# Patient Record
Sex: Female | Born: 1970 | Race: White | Hispanic: No | Marital: Married | Smoking: Never smoker
Health system: Southern US, Academic
[De-identification: ages and names within clinical notes are randomized; demographics above are authoritative.]

## PROBLEM LIST (undated history)

## (undated) DIAGNOSIS — C642 Malignant neoplasm of left kidney, except renal pelvis: Secondary | ICD-10-CM

## (undated) DIAGNOSIS — Z800 Family history of malignant neoplasm of digestive organs: Secondary | ICD-10-CM

## (undated) DIAGNOSIS — I10 Essential (primary) hypertension: Secondary | ICD-10-CM

## (undated) DIAGNOSIS — E892 Postprocedural hypoparathyroidism: Secondary | ICD-10-CM

## (undated) HISTORY — PX: HX HYSTERECTOMY: SHX81

## (undated) HISTORY — PX: TOTAL THYROIDECTOMY: SHX2547

## (undated) HISTORY — DX: Family history of malignant neoplasm of digestive organs: Z80.0

## (undated) HISTORY — PX: HX CYST REMOVAL: SHX22

## (undated) HISTORY — PX: HX GALL BLADDER SURGERY/CHOLE: SHX55

## (undated) HISTORY — PX: NEPHROURETERECTOMY: SUR876

---
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
## 2017-12-18 IMAGING — MG 3D SCREENING MAMMO BIL W/CAD
5 series · 8 of 24 positions shown · non-contrast
Comparison: 10/03/2018 and 09/30/2017.

------------- REPORT [id number] -------------
[facility]
[address]
Daina Ms.VENUSTE, KANTCLARET:
We wish to report the following on your recent mammography examination. We are sending a report to your referring physician or other health care provider. 
(       Normal/Negative:
No evidence of cancer.
This statement is mandated by the [place], Department of Health.
Your examination was performed by one of our technologists, who are registered radiological technologists and also specially certified in mammography:
___
Parlak, Edaly (M)
Dang, Mcalex (M)

Your mammogram was interpreted by our radiologist.
( 
Sofeine Made, M.D.
(Annual Breast Examination by a physician or other health care provider
(Annual Mammography Screening beginning at age 40
(Monthly Breast Self Examination
------------- REPORT [id number] -------------
NESIGILINK, AIVARUNCE
EXAM:  3D BILATERAL ANNUAL SCREENING DIGITAL MAMMOGRAM WITH TOMOSYNTHESIS AND CAD
INDICATION: Screening.

[Series 9536: R CC · right · 0.10mm/px · 2 of 2 slices shown]
[im 1/2]
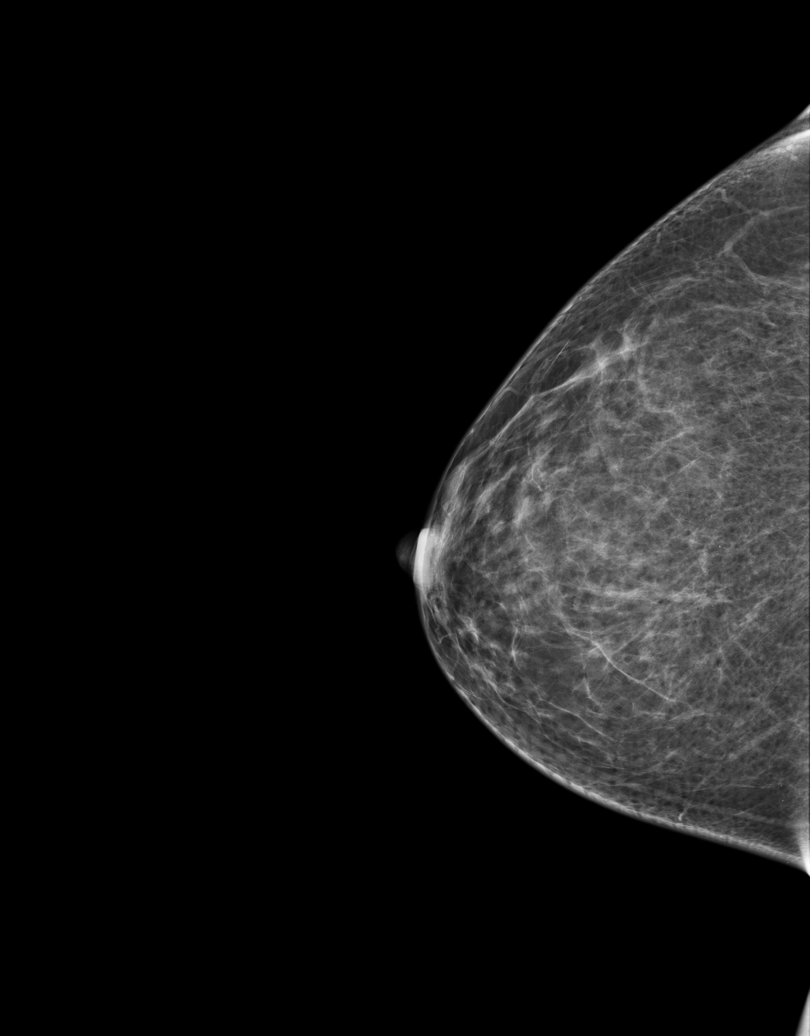
[im 2/2]
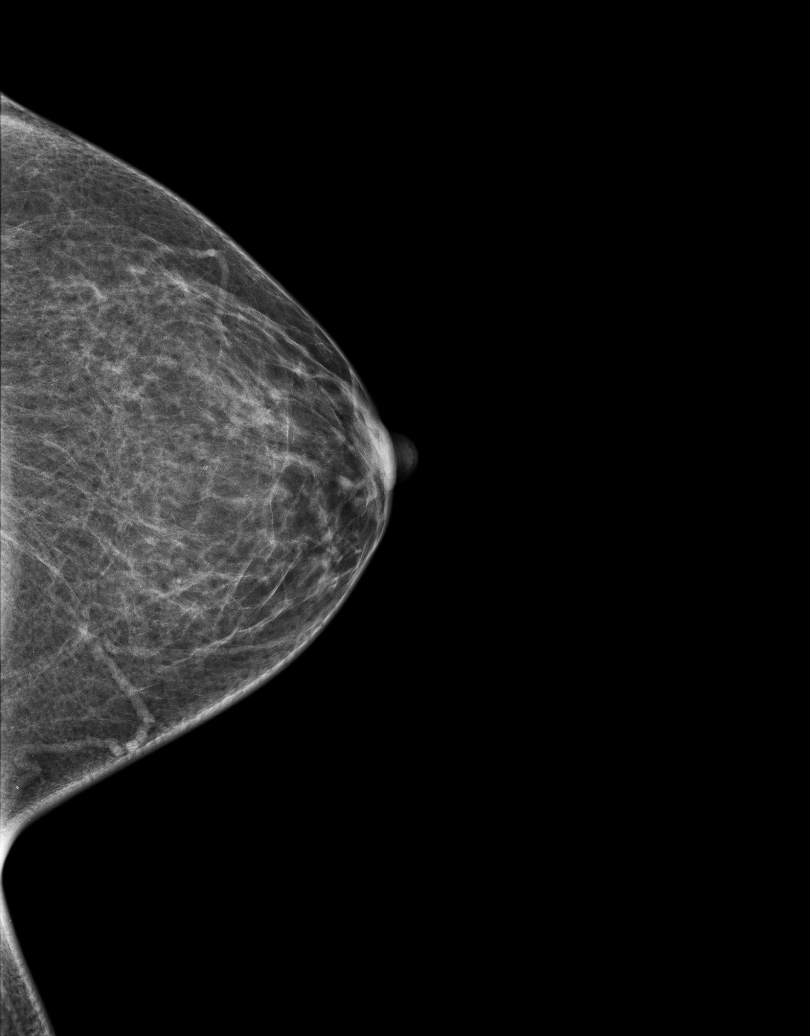

[Series 9538: 3D SCREENING MAMMO BIL W/CAD · 2 acquisitions, 3 frames shown (1 of 2)]
[im 1/2]
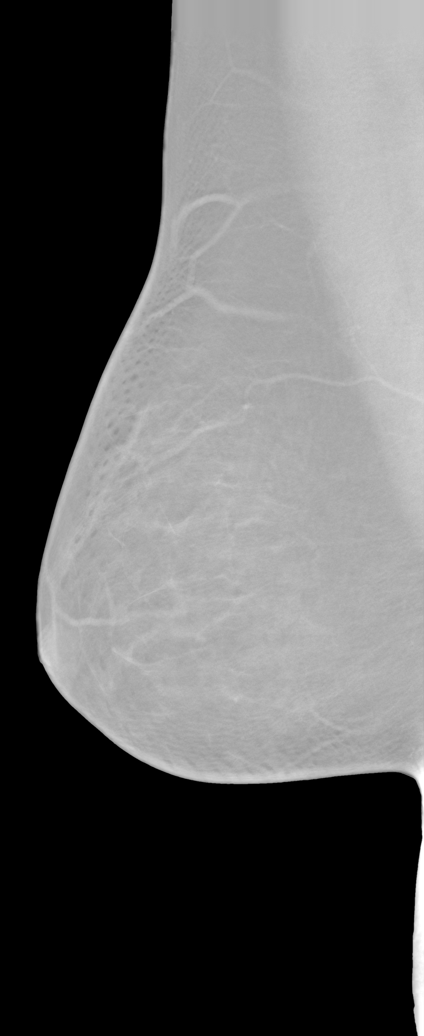
[im 2/2]
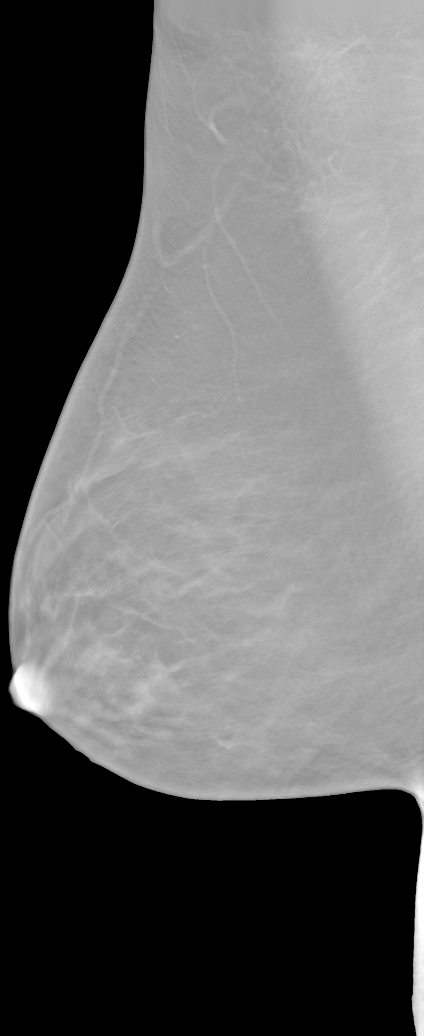
[im 2/2]
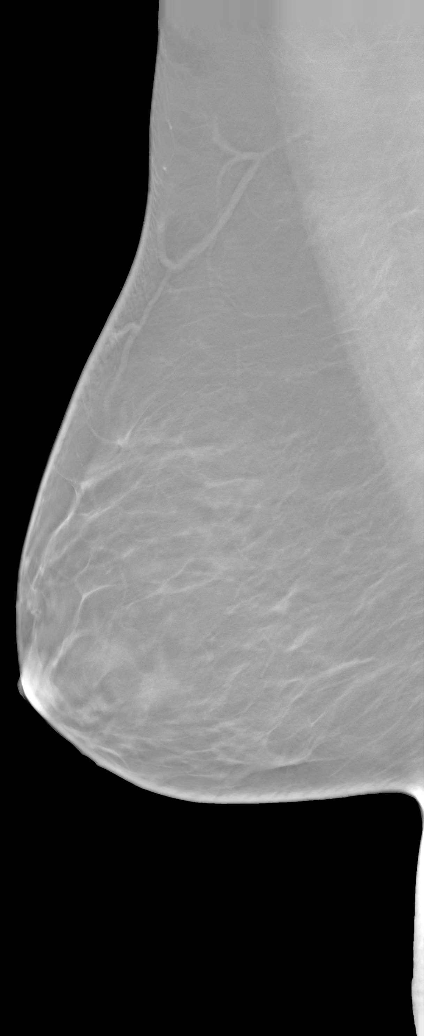

[R]
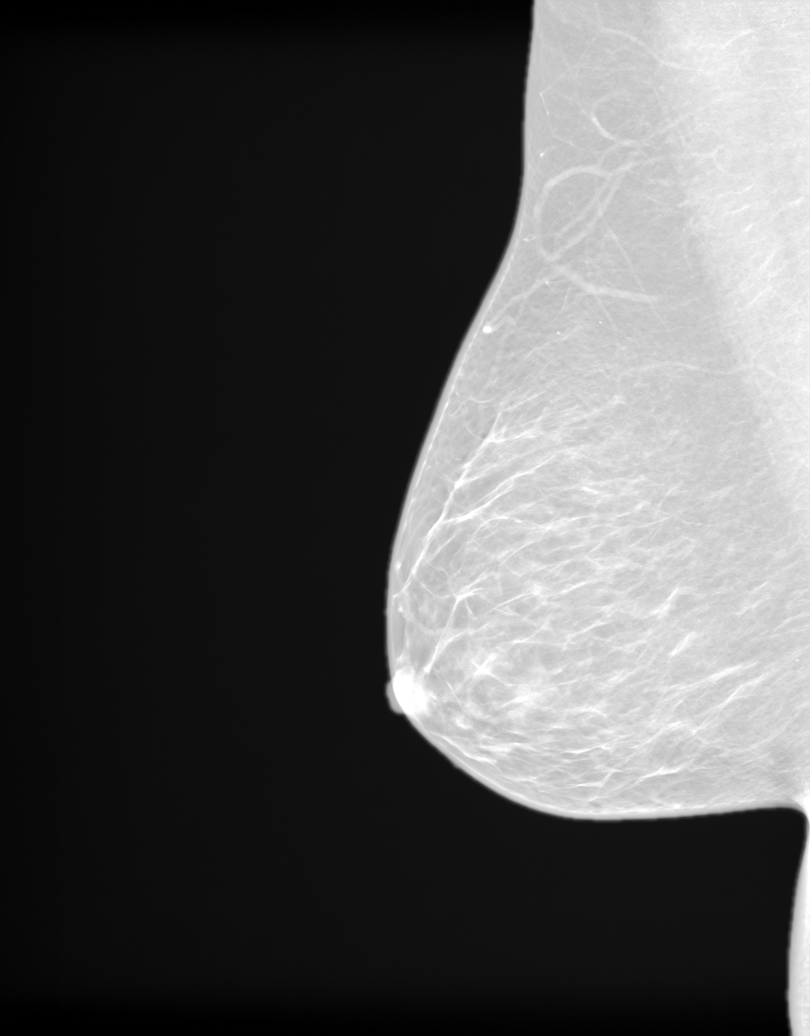

[3D SCREENING MAMMO BIL W/CAD (2 of 2) · tomo slice 10/64.0]
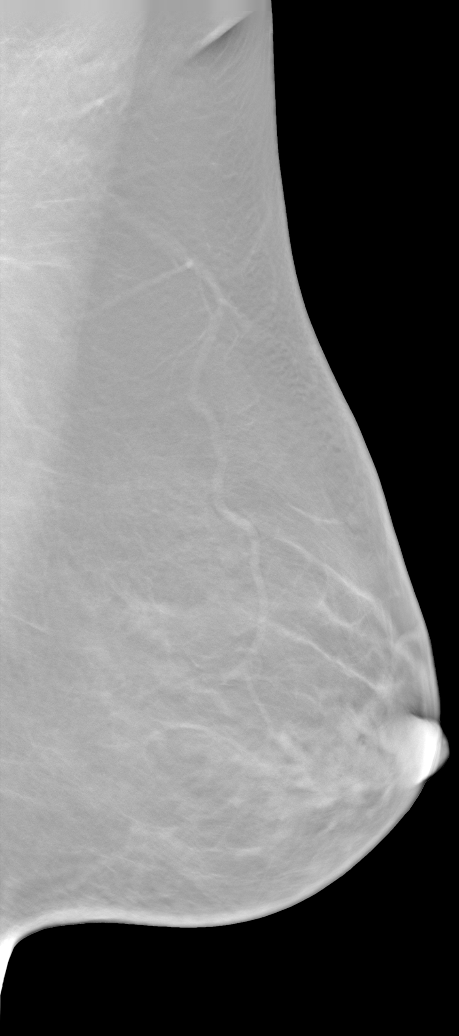

[L]
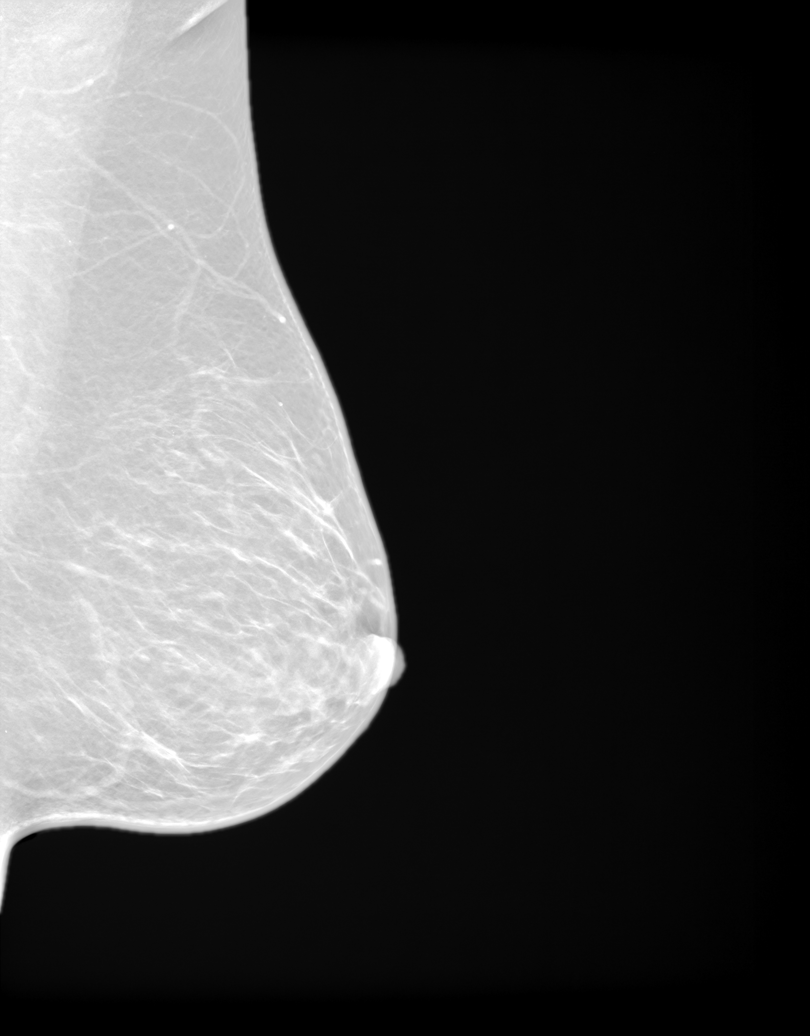

[8 of 24 positions shown; findings below may reference images not displayed]

FINDINGS: There are scattered fibroglandular elements.  There is no mass or suspicious cluster of microcalcifications.   There is no architectural distortion, skin thickening or nipple retraction.
IMPRESSION: 1.  BIRADS 2-Benign findings. Patient has been added in a reminder system with a target date for the next screening mammography.

2.  DENSITY CODE –  B (Scattered areas of fibroglandular density).

Final Assessment Code:

Bi-Rads 2 

BI-RADS 0
Need additional imaging evaluation

BI-RADS 1
Negative mammogram

BI-RADS 2
Benign finding

BI-RADS 3
Probably benign finding: short-interval follow-up suggested

BI-RADS 4
Suspicious abnormality:  biopsy should be considered

BI-RADS 5
Highly suggestive of malignancy; appropriate action should be taken

BI-RADS 6
Known Biopsy-proven Malignancy – Appropriate action should be taken

NOTE:
In compliance with Federal regulations, the results of this mammogram are being sent to the patient.

## 2018-12-22 IMAGING — MG 3D SCREENING MAMMO BIL W/CAD
5 series · 8 of 24 positions shown · non-contrast
Comparison: 12/12/2019 and 12/10/2018.

------------- REPORT [id number] -------------
[facility]
[address]
Daina Ms.VENUSTE, KANTCLARET:
We wish to report the following on your recent mammography examination. We are sending a report to your referring physician or other health care provider. 
(       Normal/Negative:
No evidence of cancer.
This statement is mandated by the [place], Department of Health.
Your examination was performed by one of our technologists, who are registered radiological technologists and also specially certified in mammography:
___
Parlak, Edaly (M)
Nepomuceno, Martinez (M)

Your mammogram was interpreted by our radiologist.
( 
Sofeine Made, M.D.
(Annual Breast Examination by a physician or other health care provider
(Annual Mammography Screening beginning at age 40
(Monthly Breast Self Examination
------------- REPORT [id number] -------------
OVEDJE, EKEBO
EXAM:  3D BILATERAL ANNUAL SCREENING DIGITAL MAMMOGRAM WITH TOMOSYNTHESIS AND CAD
INDICATION: Screening.

[R CC · right · 0.10mm/px · 2 of 2 slices shown]
[im 1/2]
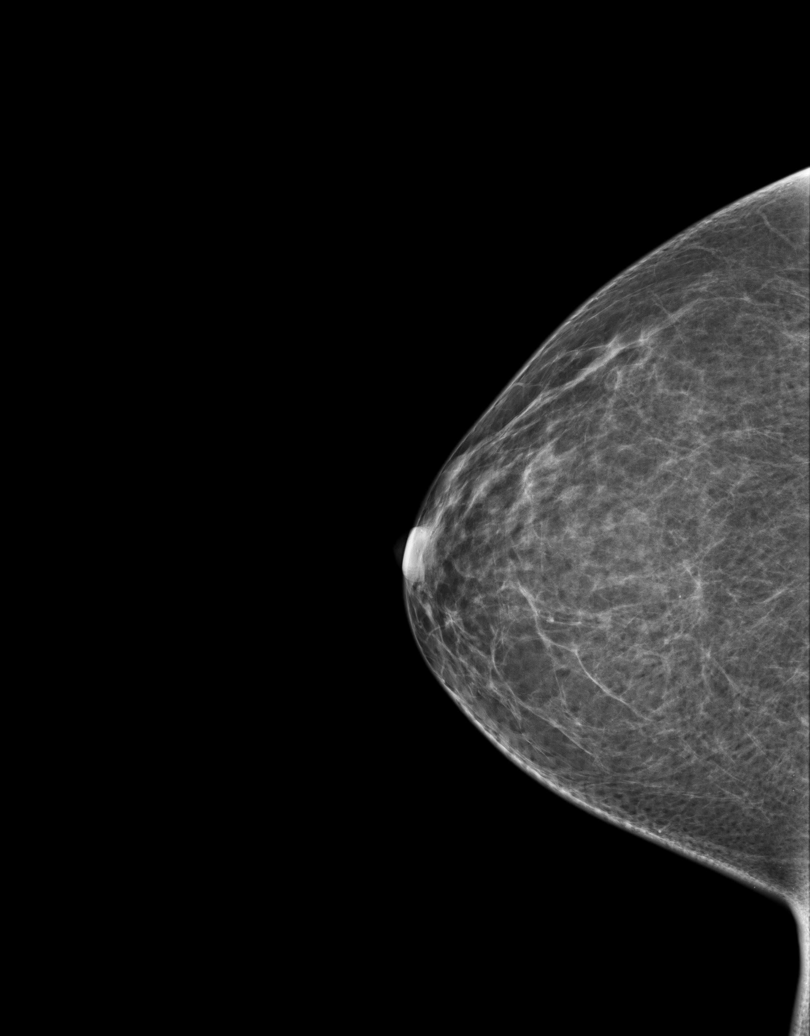
[im 2/2]
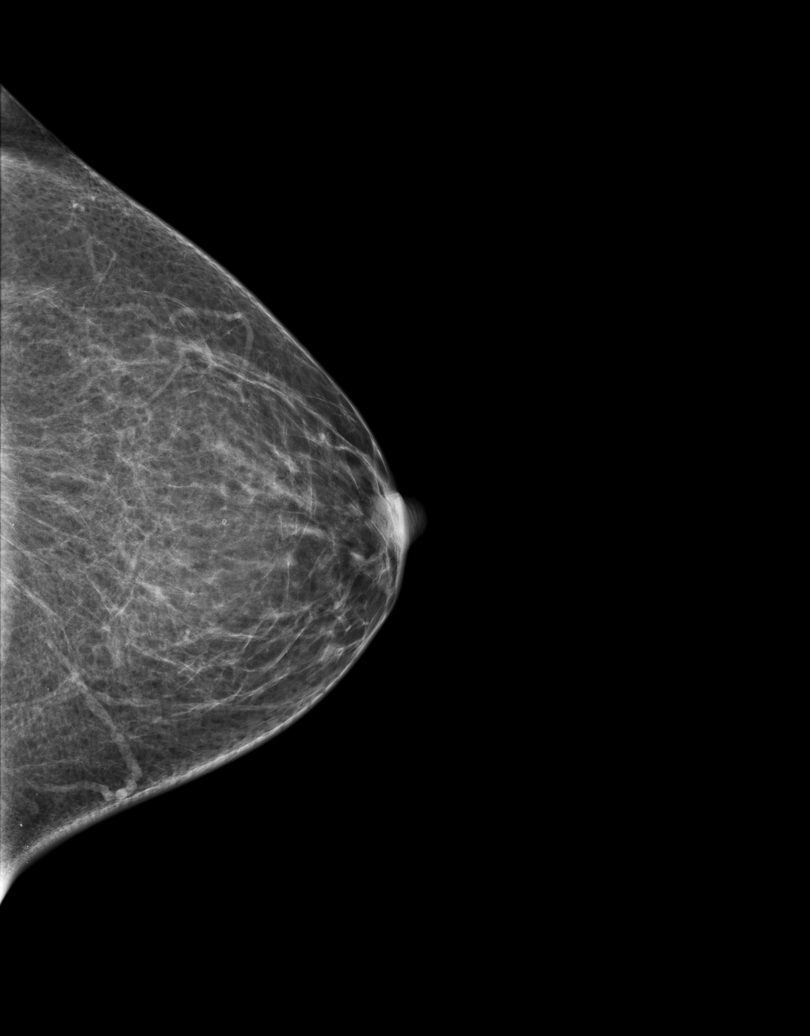

[3D SCREENING MAMMO BIL W/CAD · 2 acquisitions, 3 frames shown (1 of 2)]
[im 1/2]
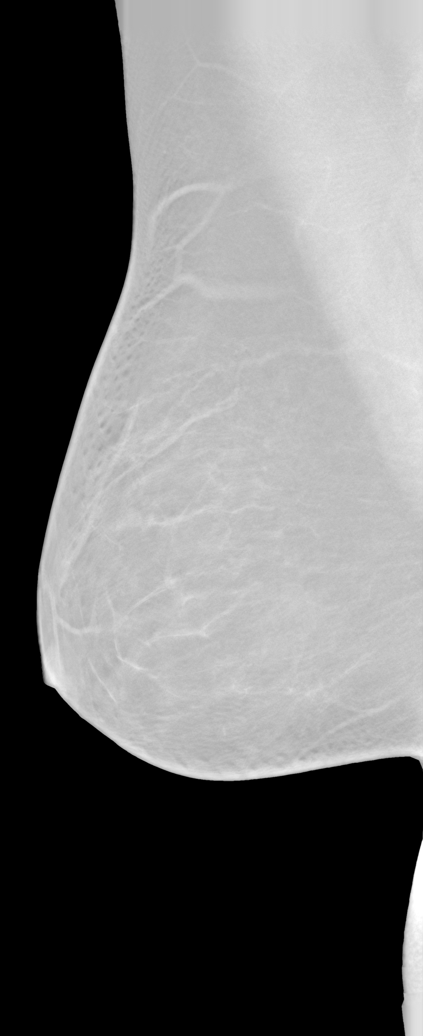
[im 2/2]
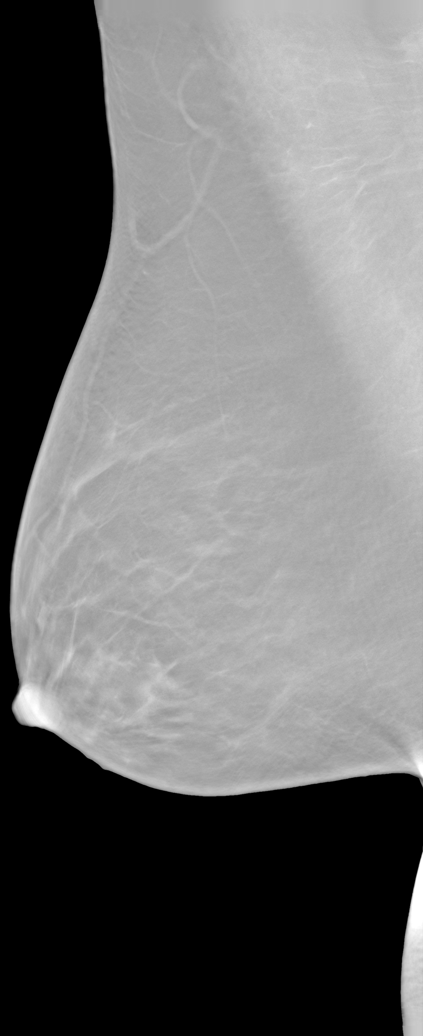
[im 2/2]
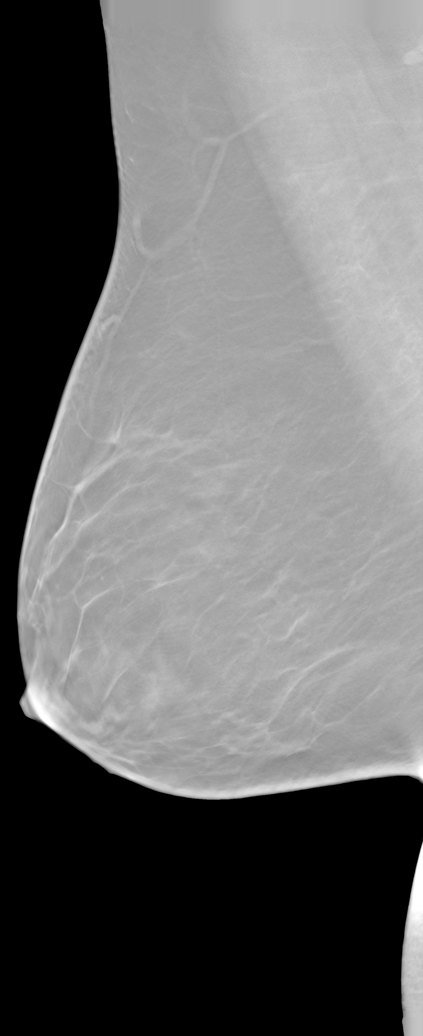

[R]
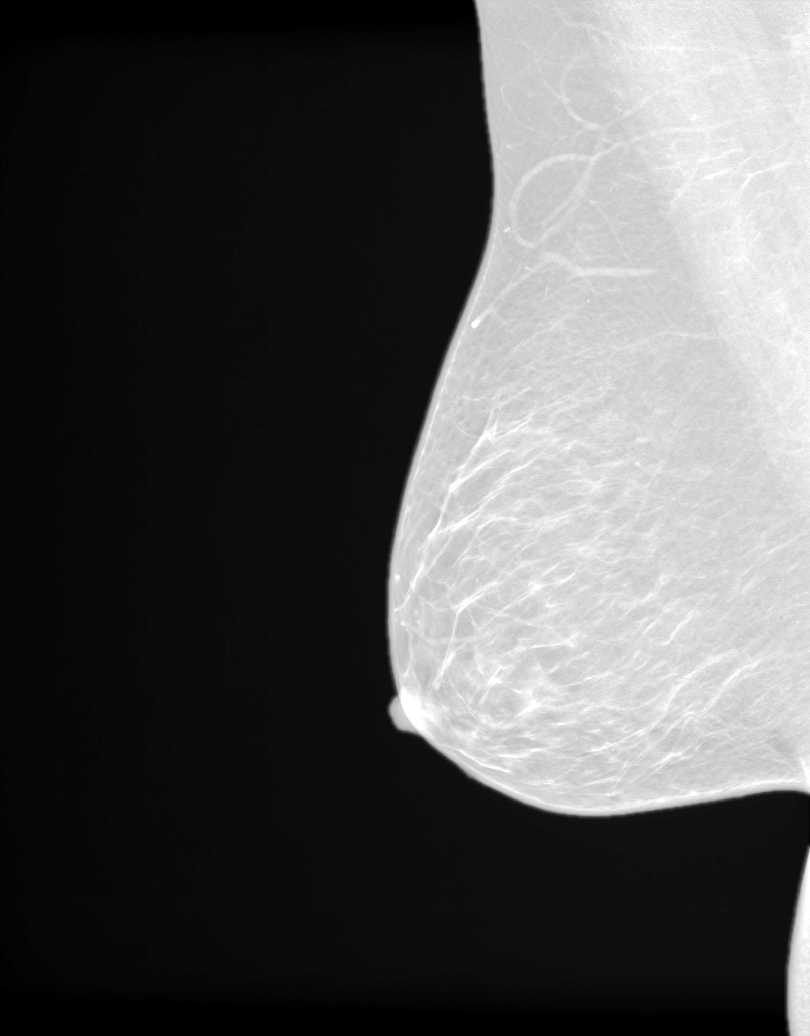

[3D SCREENING MAMMO BIL W/CAD (2 of 2) · tomo slice 11/68.0]
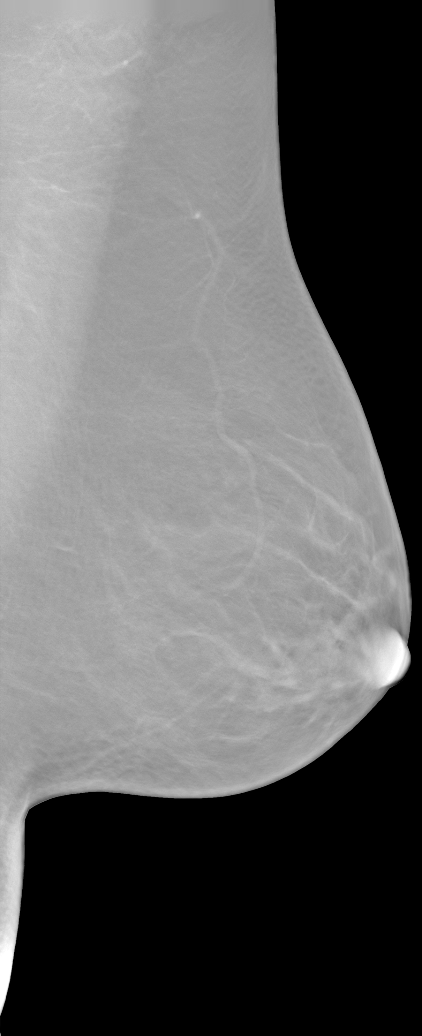

[L]
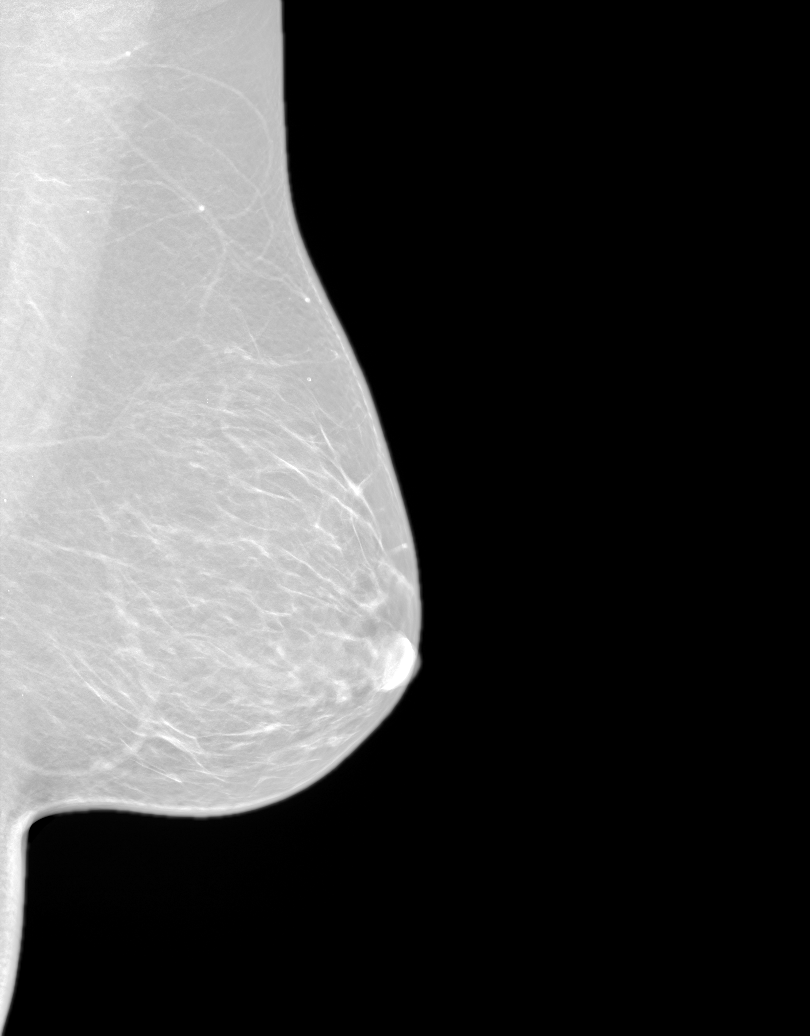

[8 of 24 positions shown; findings below may reference images not displayed]

FINDINGS: There are scattered fibroglandular elements.  There is no mass or suspicious cluster of microcalcifications.   There is no architectural distortion, skin thickening or nipple retraction.
IMPRESSION: 1.  BIRADS 2-Benign findings. Patient has been added in a reminder system with a target date for the next screening mammography.

2.  DENSITY CODE –  B (Scattered areas of fibroglandular density).

Final Assessment Code:

Bi-Rads 2 

BI-RADS 0
Need additional imaging evaluation

BI-RADS 1
Negative mammogram

BI-RADS 2
Benign finding

BI-RADS 3
Probably benign finding; short-interval follow-up suggested

BI-RADS 4
Suspicious abnormality; biopsy should be considered

BI-RADS 5
Highly suggestive of malignancy; appropriate action should be taken

BI-RADS 6
Known biopsy-proven malignancy; appropriate action should be taken

NOTE:
In compliance with Federal regulations, the results of this mammogram are being sent to the patient.

## 2019-12-28 IMAGING — MG 3D SCREENING MAMMO BIL W/CAD
5 series · 8 of 24 positions shown · non-contrast
Comparison: 01/08/2020 and 01/04/2019.

------------- REPORT [id number] -------------
[facility]
[address]
Daina Ms.VENUSTE, KANTCLARET:
We wish to report the following on your recent mammography examination. We are sending a report to your referring physician or other health care provider. 
(       Normal/Negative:
No evidence of cancer.
This statement is mandated by the [place], Department of Health.
Your examination was performed by one of our technologists, who are registered radiological technologists and also specially certified in mammography:
___
Parlak, Edaly (M)
Nepomuceno, Martinez (M)

Your mammogram was interpreted by our radiologist.
( 
Sofeine Made, M.D.
(Annual Breast Examination by a physician or other health care provider
(Annual Mammography Screening beginning at age 40
(Monthly Breast Self Examination
------------- REPORT [id number] -------------
﻿
                         #:
EXAM:  3D BILATERAL ANNUAL SCREENING DIGITAL MAMMOGRAM WITH CAD AND TOMOSYNTHESIS
INDICATION: Screening.

[R]
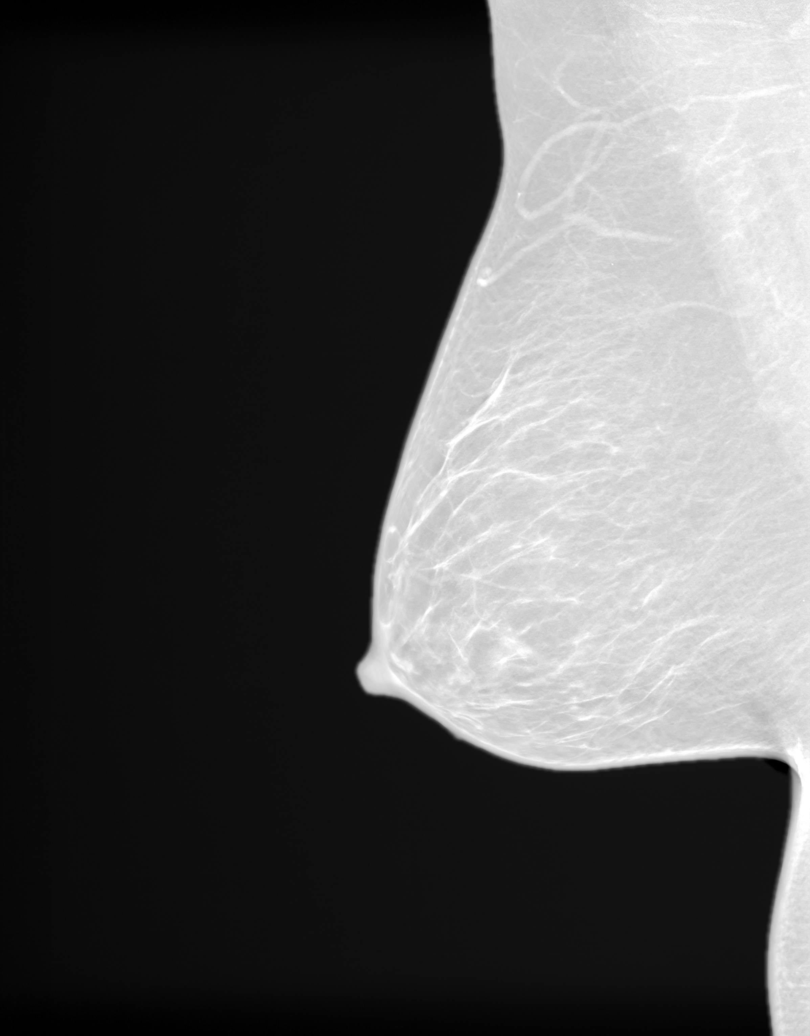

[L]
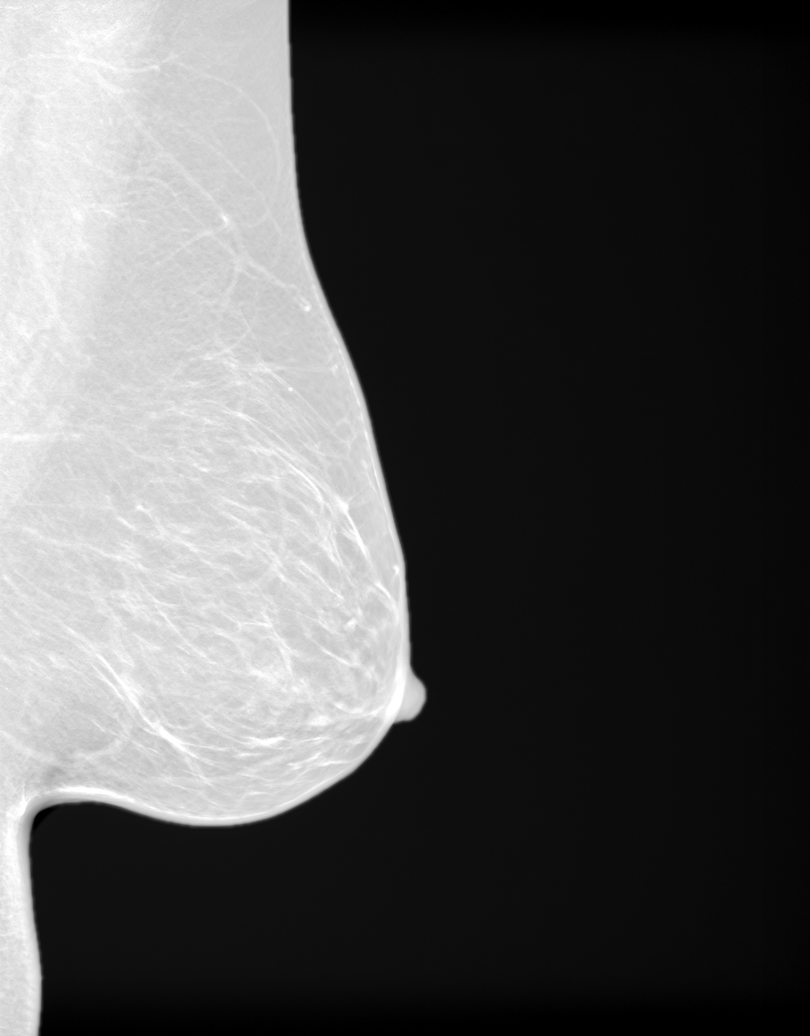

[R CC · right · 2 of 2 slices shown]
[im 1/2]
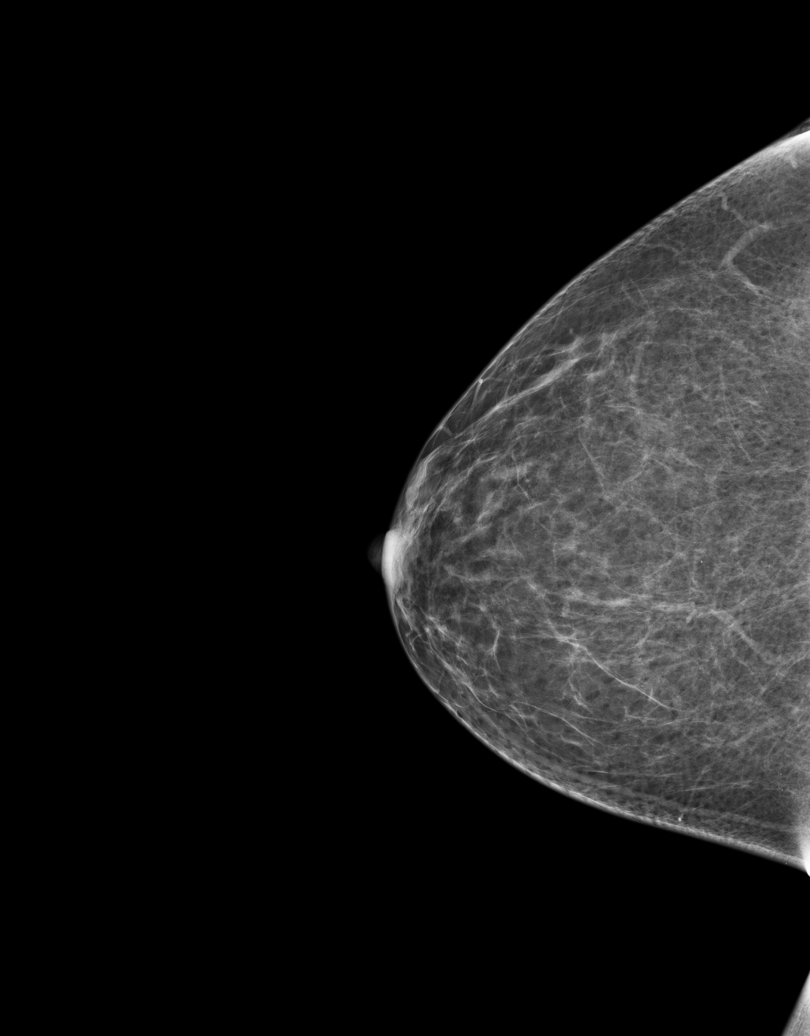
[im 2/2]
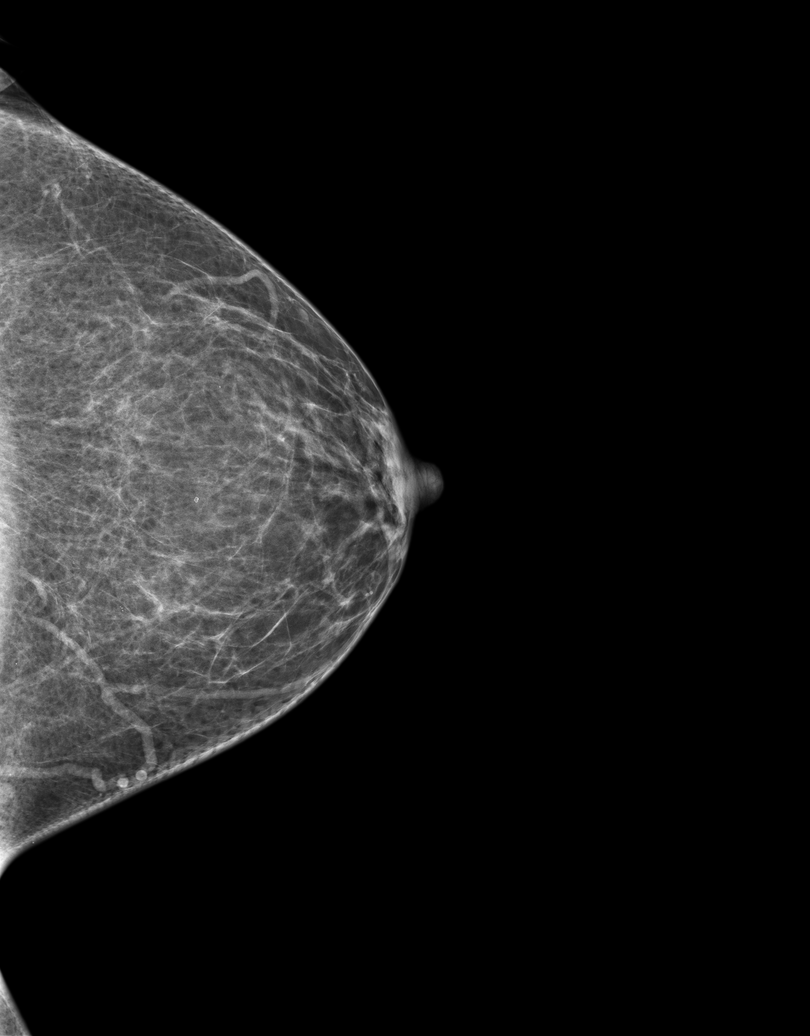

[3D SCREENING MAMMO BIL W/CAD · 2 acquisitions, 3 frames shown (1 of 2)]
[im 1/2]
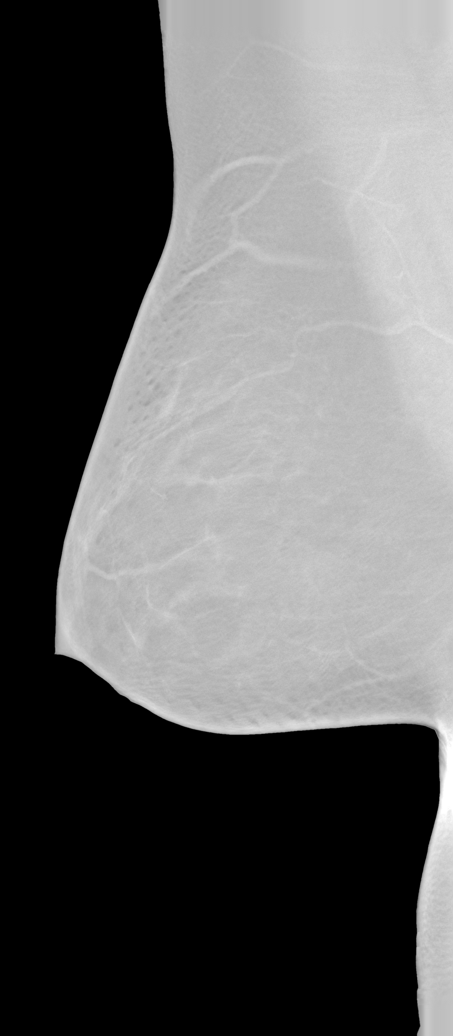
[im 2/2]
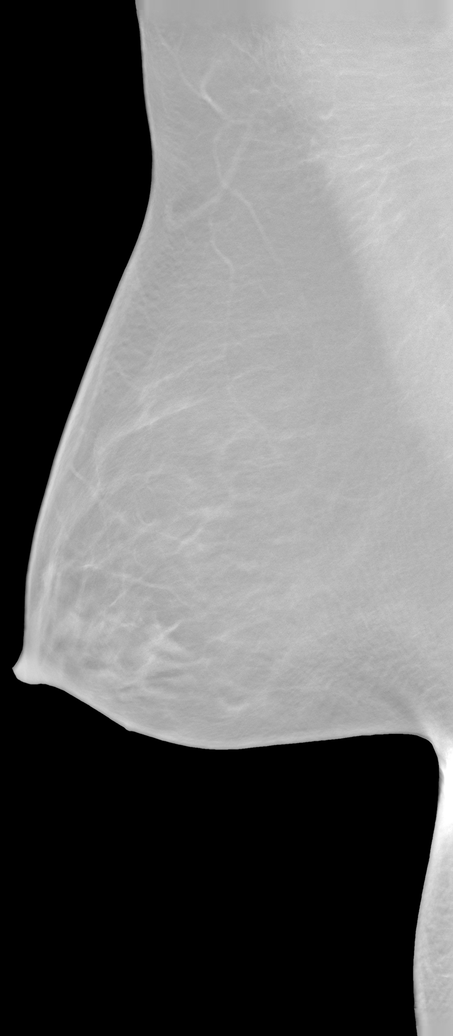
[im 2/2]
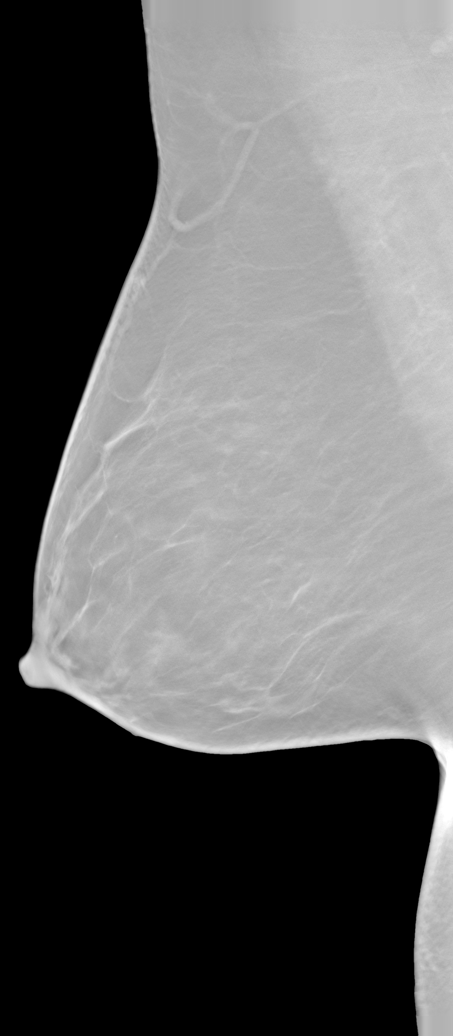

[3D SCREENING MAMMO BIL W/CAD (2 of 2) · tomo slice 12/72.0]
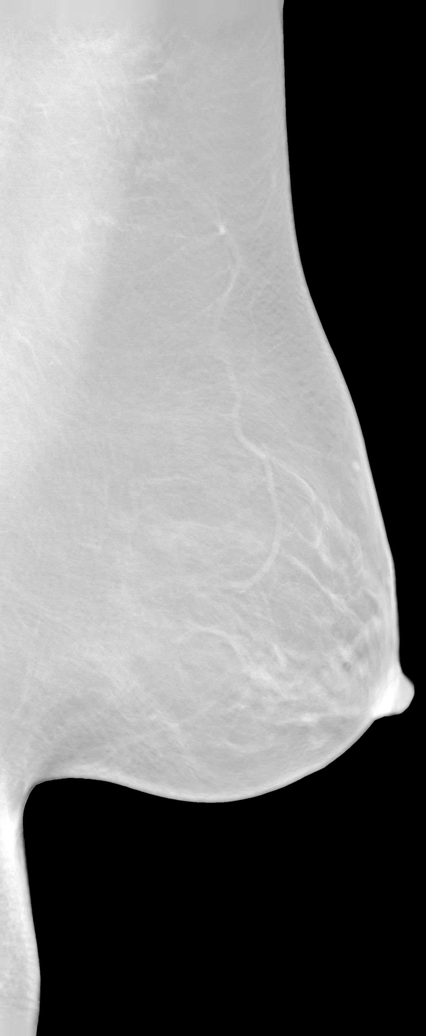

[8 of 24 positions shown; findings below may reference images not displayed]

FINDINGS: There are scattered fibroglandular elements.  There is no mass or suspicious cluster of microcalcifications.   There is no architectural distortion, skin thickening or nipple retraction.
IMPRESSION: 1.  BIRADS 2-Benign findings. Patient has been added in a reminder system with a target date for the next screening mammography.

2.  DENSITY CODE – B (Scattered areas of fibroglandular density). 

Final Assessment Code:

Bi-Rads 2

BI-RADS 0
Need additional imaging evaluation.

BI-RADS 1
Negative mammogram.

BI-RADS 2
Benign finding.

BI-RADS 3
Probably benign finding; short-interval follow-up suggested.

BI-RADS 4
Suspicious abnormality; biopsy should be considered.

BI-RADS 5
Highly suggestive of malignancy; appropriate action should be taken.

BI-RADS 6
Known biopsy-proven malignancy; appropriate action should be taken.

NOTE:
In compliance with Federal regulations, the results of this mammogram are being sent to the patient.

## 2020-11-05 IMAGING — MR MRI CERVICAL SPINE WITHOUT AND WITH CONTRAST
6 of 9 series · 30 of 48 positions shown · IV contrast (gadavist)
Comparison: None available.

﻿EXAM:  32118   MRI CERVICAL SPINE WITHOUT AND WITH CONTRAST
INDICATION: Left-sided neck pain and headaches. History of thyroidectomy.
TECHNIQUE: Multiplanar multisequential MRI of the cervical spine was performed without and with 4 mL of Gadavist.

[Series 5: T2 · sagittal · 3.0mm · 0.75mm/px · 4 of 13 slices shown (1 of 3)]
[im 1/13]
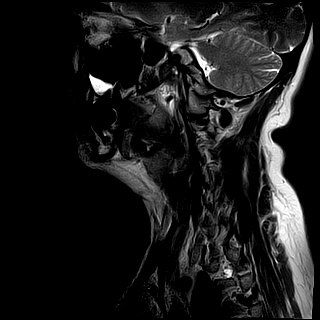
[im 5/13]
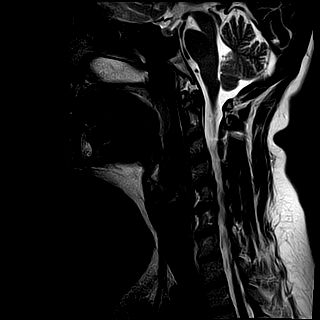
[im 9/13]
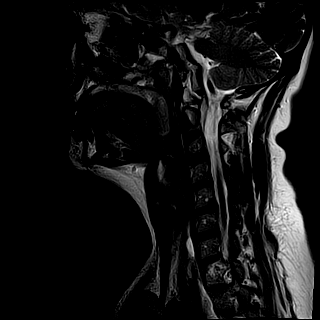
[im 13/13]
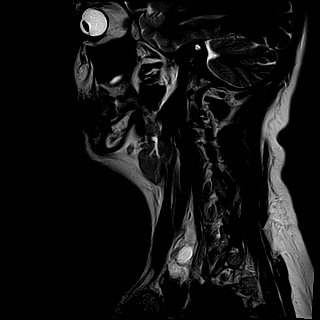

[Series 7: T1 · sagittal · 4.0mm · 0.47mm/px · 4 of 15 slices shown]
[im 1/15]
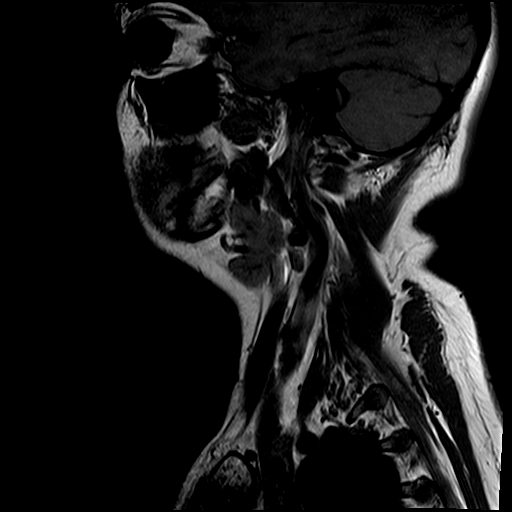
[im 5/15]
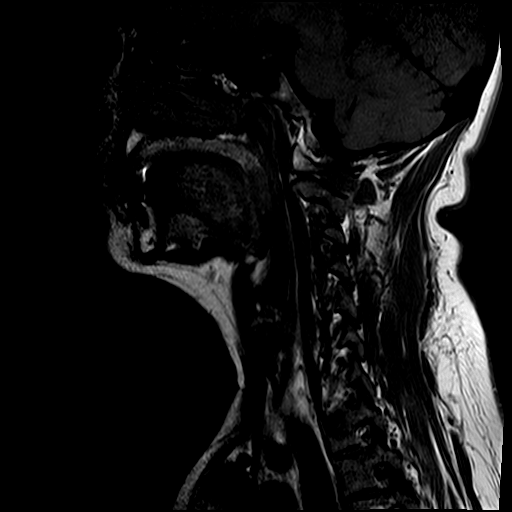
[im 10/15]
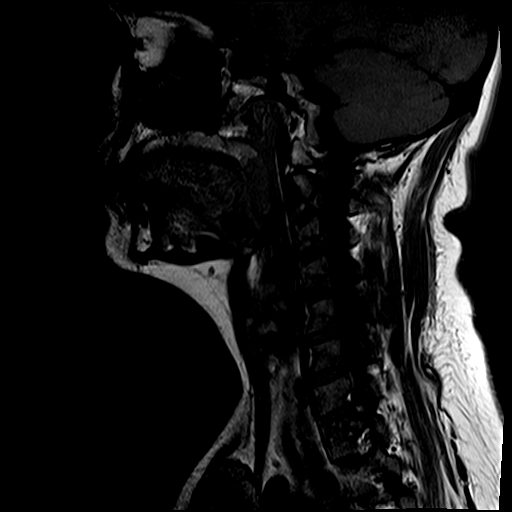
[im 15/15]
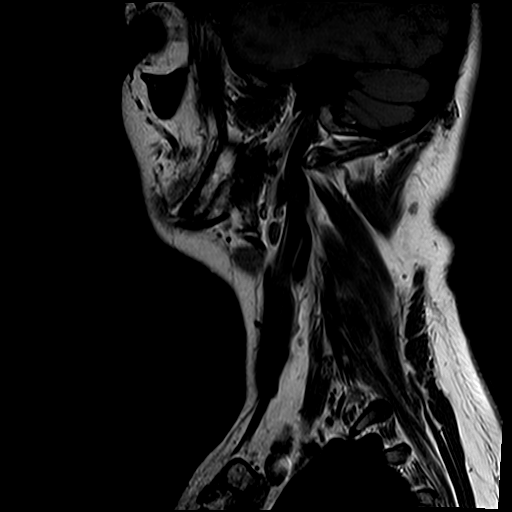

[Series 8: T1 fat-sat · sagittal · 4.0mm · 0.75mm/px · 4 of 15 slices shown (1 of 2)]
[im 1/15]
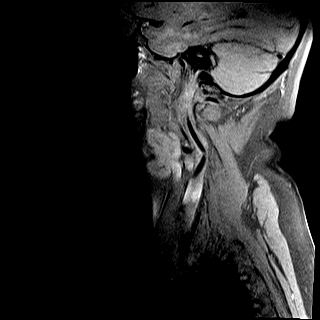
[im 5/15]
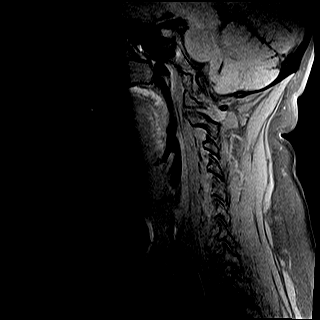
[im 10/15]
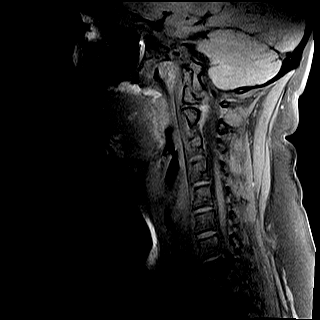
[im 15/15]
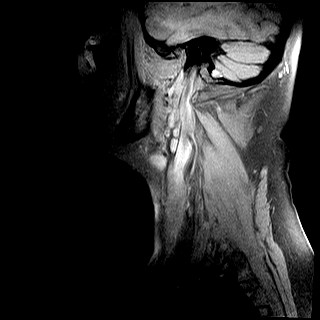

[Series 9: T2 · axial · 5.0mm · 0.35mm/px · z∈[-115,+34]mm · 8 of 26 slices shown (2 of 3)]
[im 1/26]
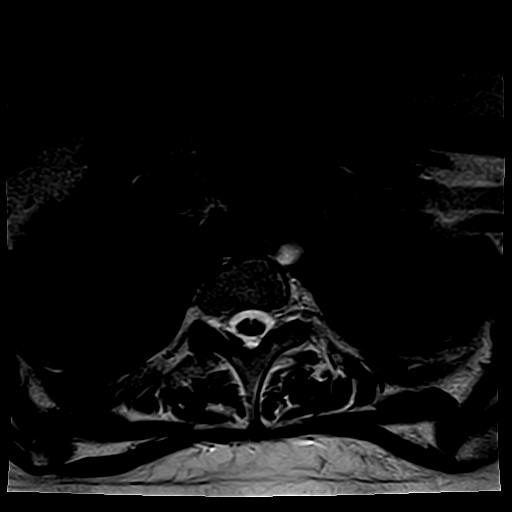
[im 4/26]
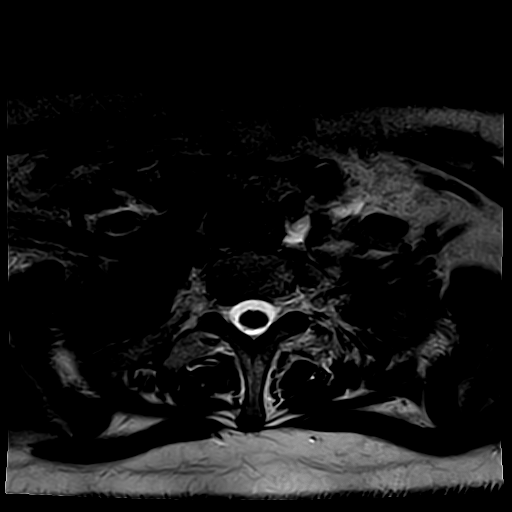
[im 8/26]
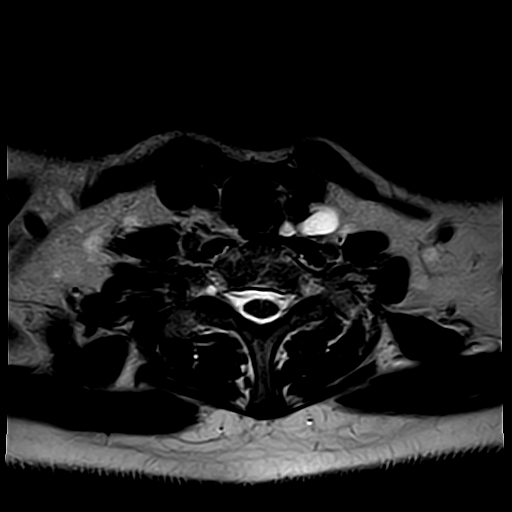
[im 11/26]
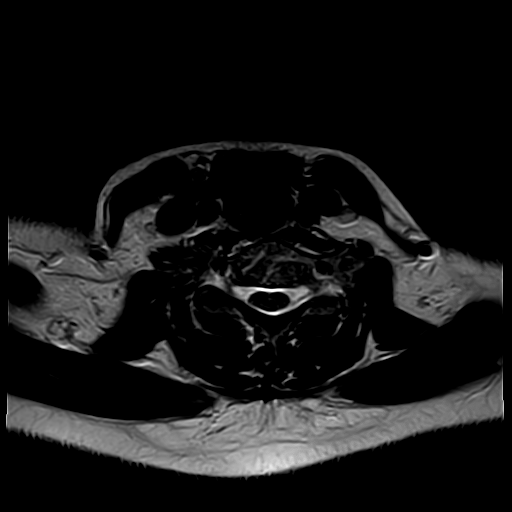
[im 15/26]
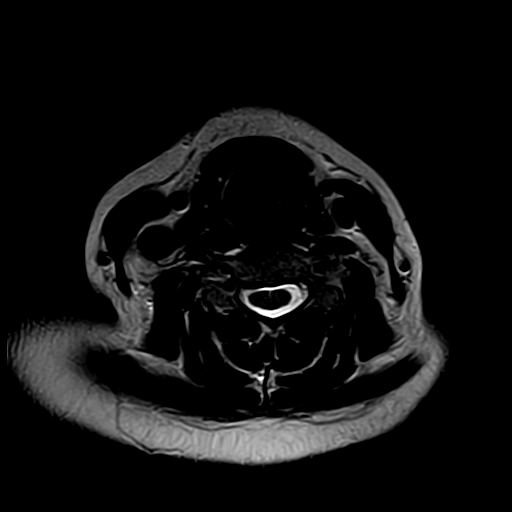
[im 18/26]
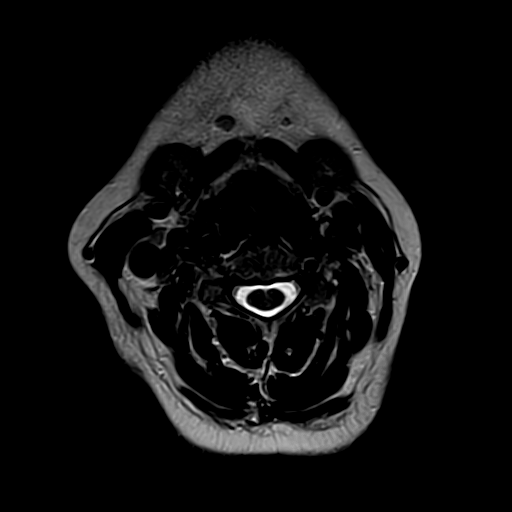
[im 22/26]
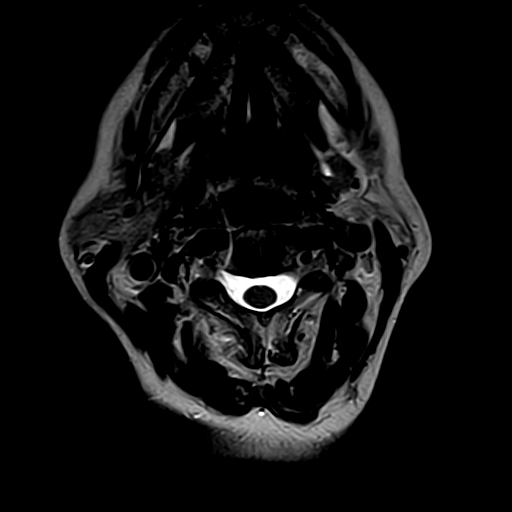
[im 26/26]
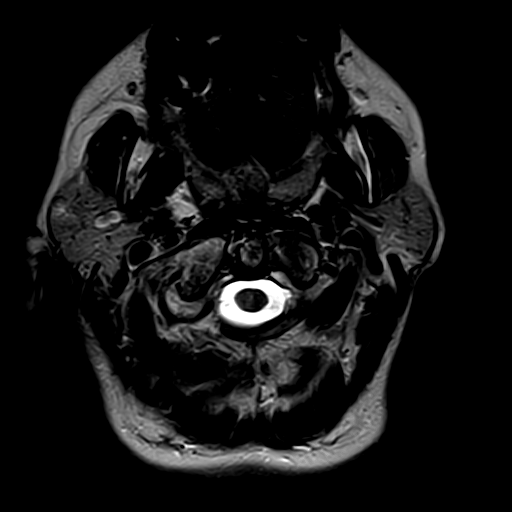

[Series 10: T1 fat-sat · axial · 5.0mm · 0.70mm/px · z∈[-115,-13]mm · 6 of 26 slices shown (2 of 2)]
[im 1/26]
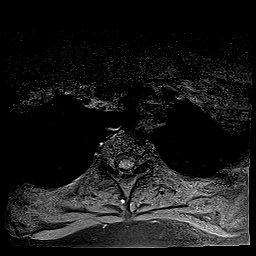
[im 4/26]
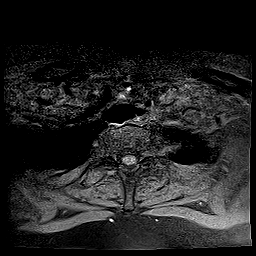
[im 8/26]
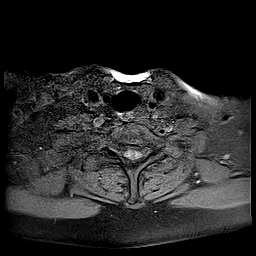
[im 11/26]
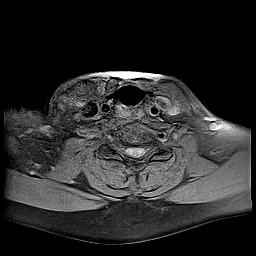
[im 15/26]
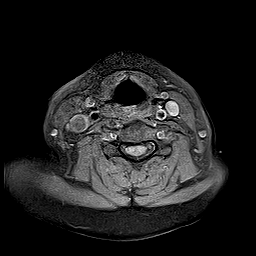
[im 18/26]
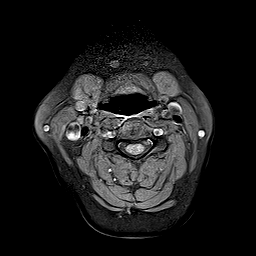

[Series 13: T2 · sagittal · 4.0mm · 0.75mm/px · 4 of 15 slices shown (3 of 3)]
[im 1/15]
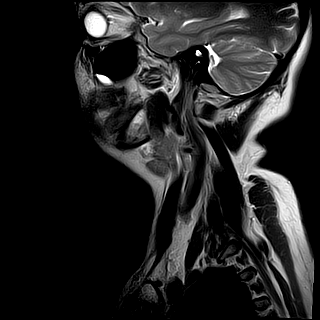
[im 5/15]
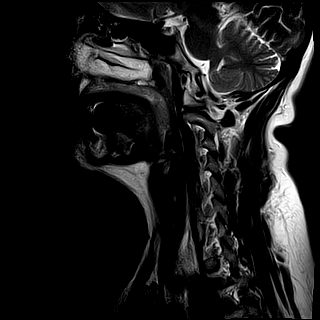
[im 10/15]
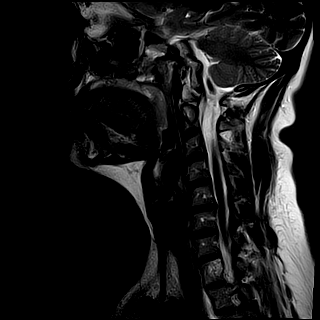
[im 15/15]
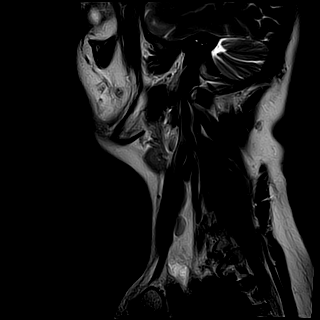

[30 of 48 positions shown; findings below may reference images not displayed]

FINDINGS: Mild reversal of cervical lordosis is likely positional. Bone marrow signal intensity is normal. There is no acute fracture or subluxation. Visualized spinal cord is also normal in signal intensity without evidence of compression at any level.

C2-3 level is unremarkable.

At C3-4 level, there is a minimal bulging annulus, minimally abutting the ventral spinal cord. There is mild left neural foraminal stenosis from uncovertebral joint hypertrophy.

At C4-5 level, there is a small broad-based central and right paracentral disc osteophyte complex with near complete effacement of the ventral CSF. There is mild right neural foraminal stenosis from uncovertebral joint hypertrophy.

At C5-6 level, there is a small broad-based central disc osteophyte complex mildly indenting the ventral cord. There is no significant neural foraminal stenosis.

C6-7 and C7-T1 levels are unremarkable. There is a small hemangioma within the left aspect of T1 vertebral body. Small cysts are incidentally noted within the left thyroidectomy bed.

Following intravenous contrast administration, there is no abnormal spinal cord or paraspinal soft tissue enhancement.
IMPRESSION: 1. Mild indentation of the ventral cord at C5-6 level from a small central disc osteophyte complex. 

2. Multilevel neural foraminal stenosis as detailed above.

## 2021-01-01 IMAGING — MG 3D SCREENING MAMMO BIL W/CAD & TOMO
5 series · 8 of 24 positions shown · non-contrast
Comparison: 10/27/2021 and 11/08/2020.

------------- REPORT [id number] -------------
﻿

SIRUP, WAJAH PRIBUMI
EXAM:  3D BILATERAL ANNUAL SCREENING DIGITAL MAMMOGRAM WITH CAD AND TOMOSYNTHESIS
INDICATION: Screening.

[L]
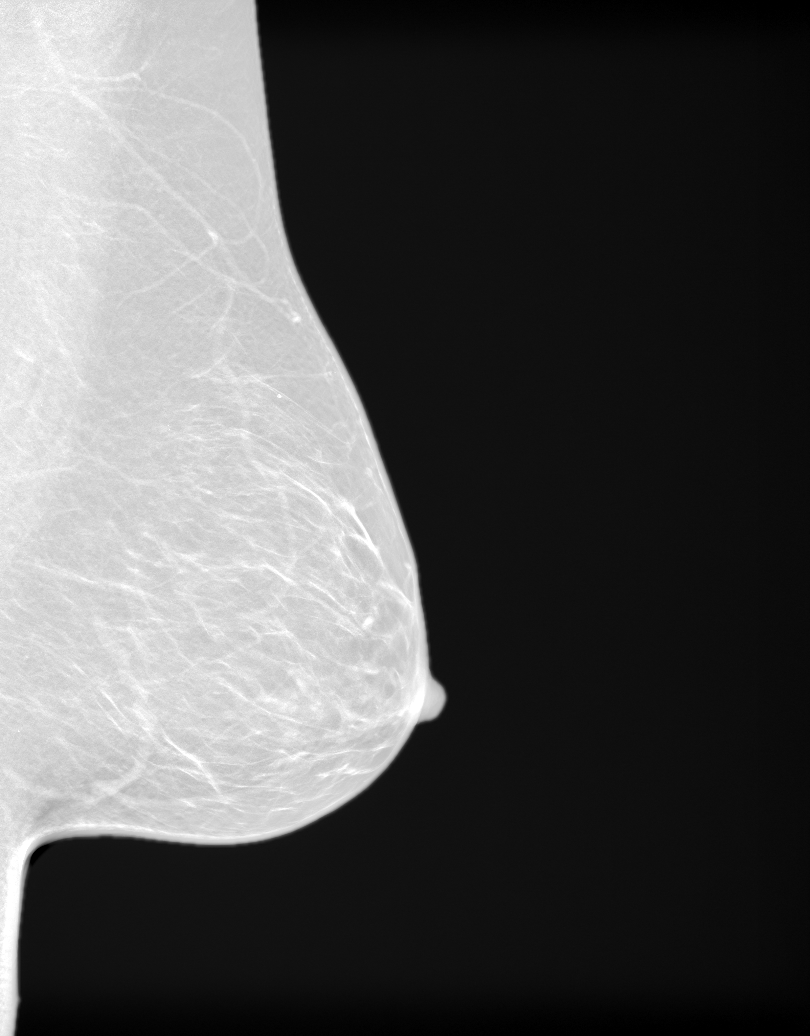

[R]
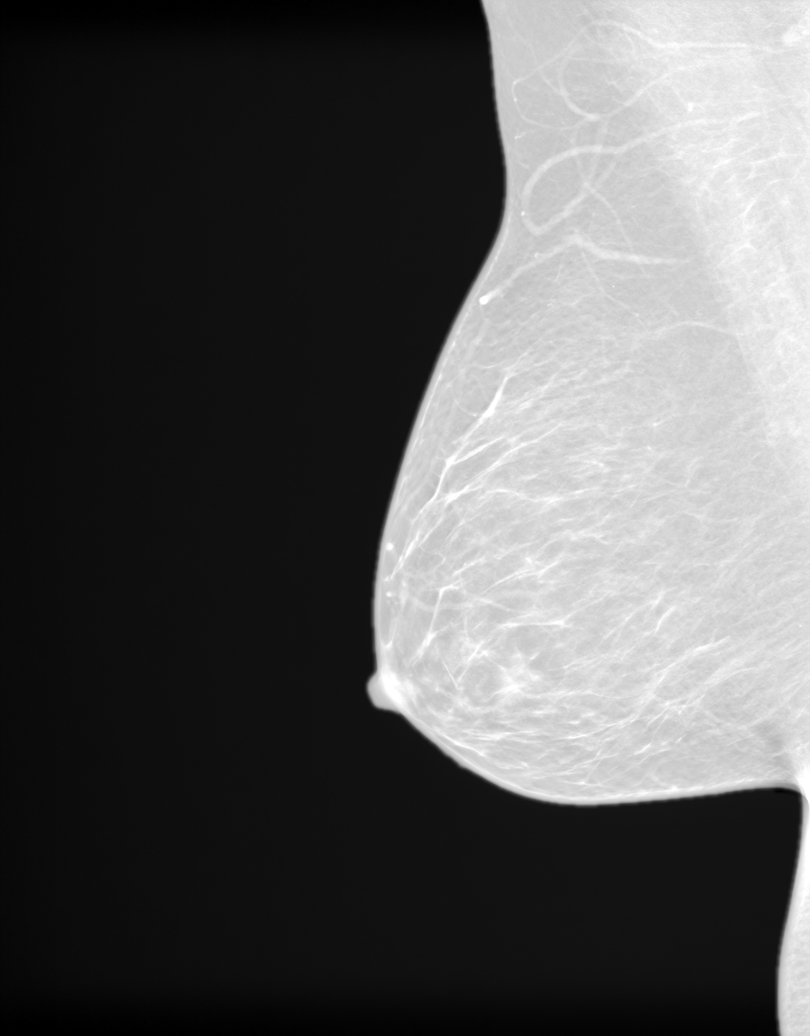

[R CC tomo · right · 0.10mm/px · 2 of 2 slices shown]
[im 1/2]
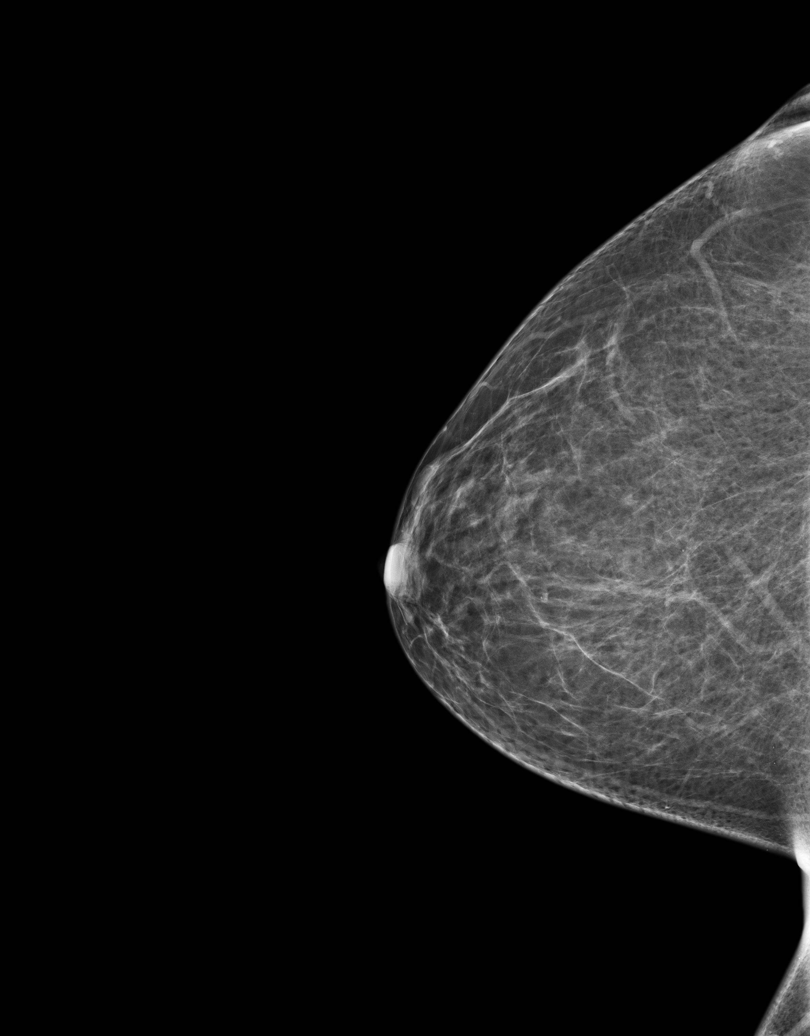
[im 2/2]
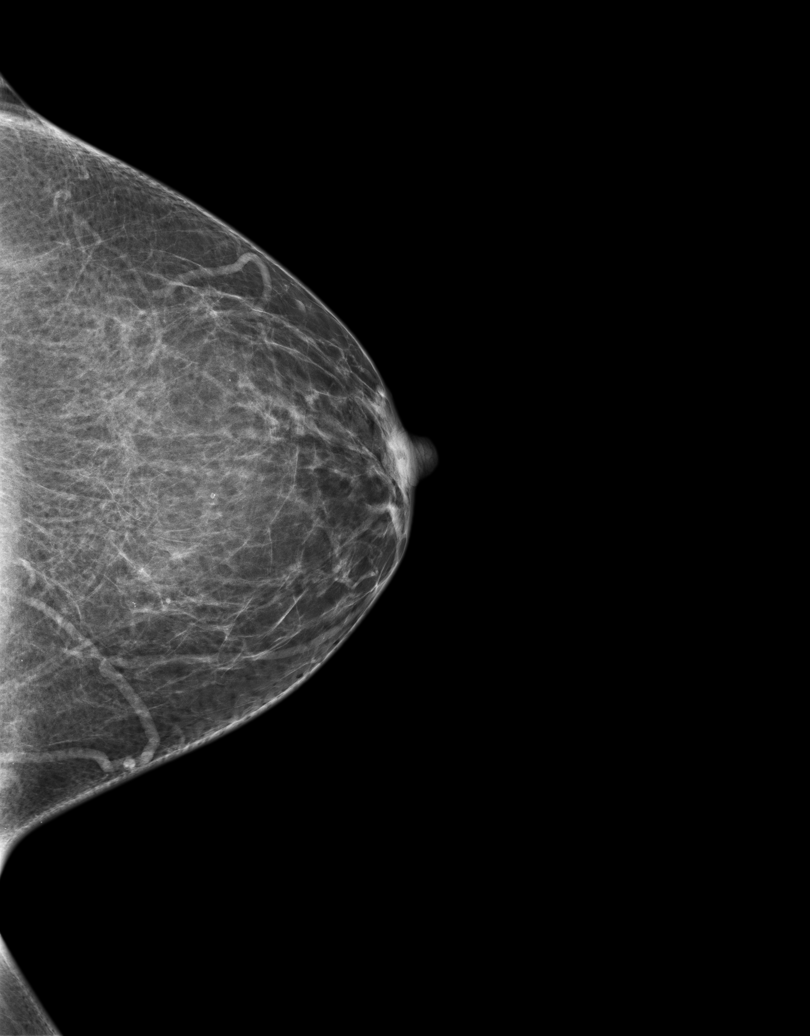

[3D SCREENING MAMMO BIL W/CAD & TOMO tomo · 2 acquisitions, 3 frames shown (1 of 2)]
[im 1/2]
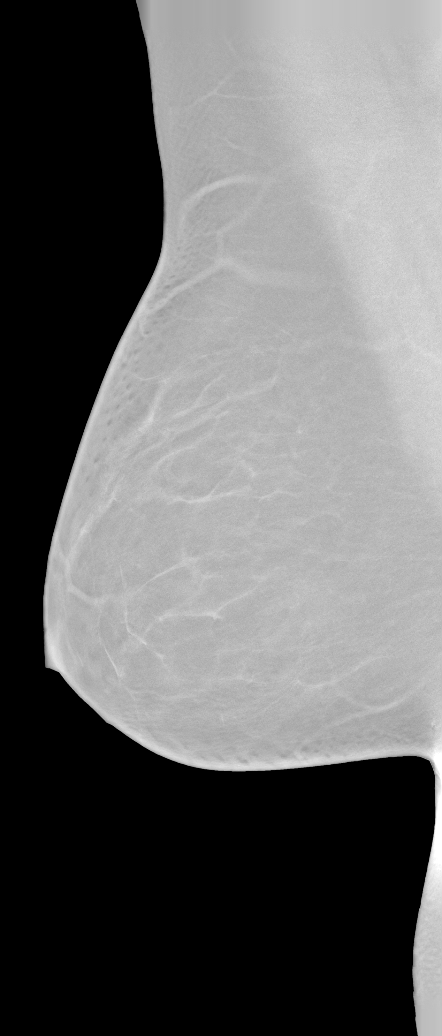
[im 2/2]
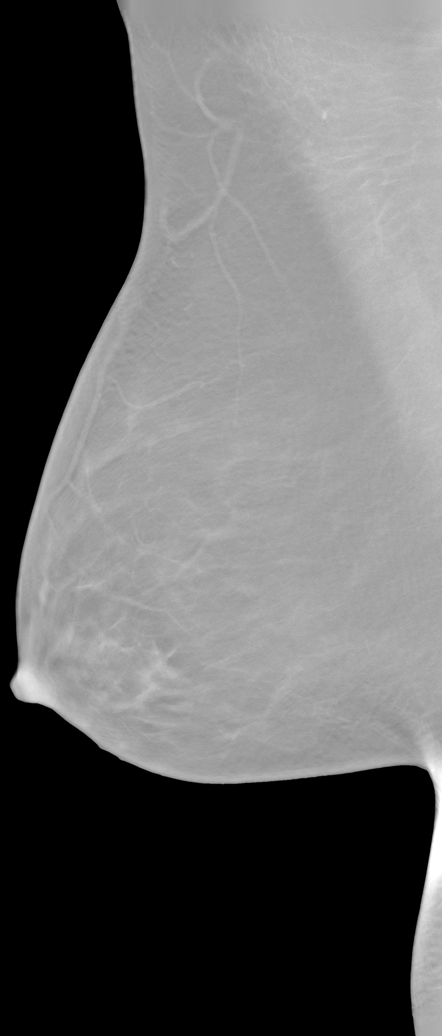
[im 2/2]
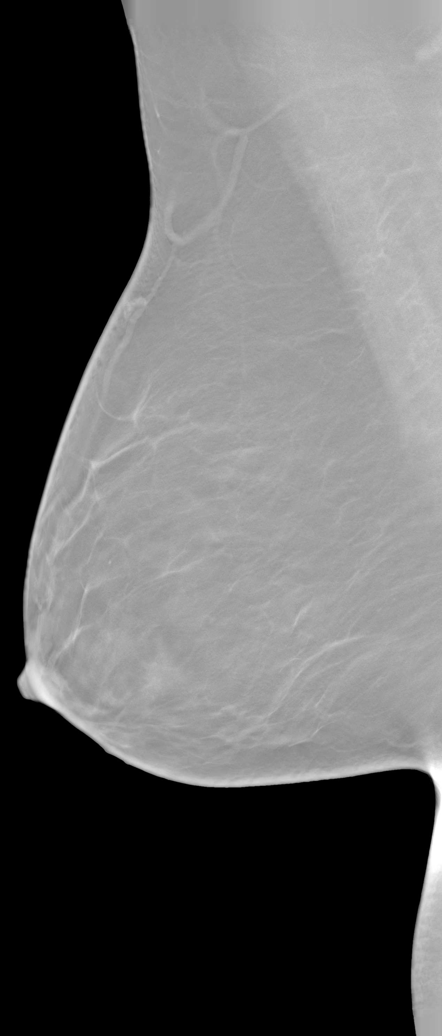

[3D SCREENING MAMMO BIL W/CAD & TOMO tomo (2 of 2) · tomo slice 11/69.0]
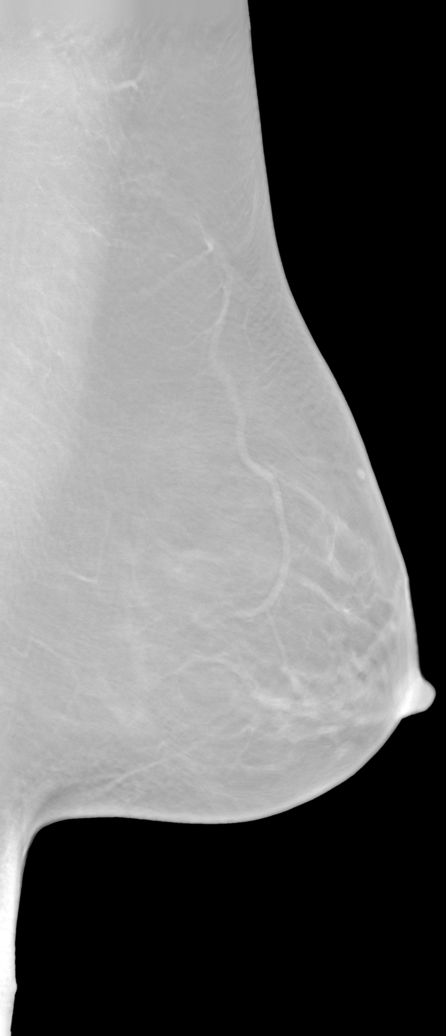

[8 of 24 positions shown; findings below may reference images not displayed]

FINDINGS: There are scattered fibroglandular elements.  There is no mass or suspicious cluster of microcalcifications.   There is no architectural distortion, skin thickening or nipple retraction.
IMPRESSION: 1.  BIRADS 2-Benign findings. Patient has been added in a reminder system with a target date for the next screening mammography.

2.  DENSITY CODE – B (Scattered areas of fibroglandular density).   

Final Assessment Code:

Bi-Rads 2 

BI-RADS 0
 Need additional imaging evaluation.

BI-RADS 1
 Negative mammogram.

BI-RADS 2
 Benign finding.

BI-RADS 3
 Probably benign finding; short-interval follow-up suggested.

BI-RADS 4
 Suspicious abnormality; biopsy should be considered.

BI-RADS 5
 Highly suggestive of malignancy; appropriate action should be taken.

BI-RADS 6
 Known biopsy-proven malignancy; appropriate action should be taken.

NOTE:
In compliance with Federal regulations, the results of this mammogram are being sent to the patient.

------------- REPORT [id number] -------------
[facility]
[address]
Tiger Ms.AHIR, PHATHEKA:
We wish to report the following on your recent mammography examination. We are sending a report to your referring physician or other health care provider. 
(       Normal/Negative:
No evidence of cancer.
This statement is mandated by the [place], Department of Health.
Your examination was performed by one of our technologists, who are registered radiological technologists and also specially certified in mammography:
___
Markland, Marjuan (M)

Your mammogram was interpreted by our radiologist.

( 
Collette Sedman, M.D.

(Annual Breast Examination by a physician or other health care provider
(Annual Mammography Screening beginning at age 40
(Monthly Breast Self Examination

## 2021-06-25 ENCOUNTER — Telehealth (INDEPENDENT_AMBULATORY_CARE_PROVIDER_SITE_OTHER): Payer: Self-pay | Admitting: Family Medicine

## 2021-06-25 DIAGNOSIS — M542 Cervicalgia: Secondary | ICD-10-CM

## 2021-06-25 DIAGNOSIS — M4802 Spinal stenosis, cervical region: Secondary | ICD-10-CM

## 2021-06-25 NOTE — Telephone Encounter (Signed)
Patient left a vmail stating her back pain is worse and now radiating in her arm and down leg. She asked if you will send a referral to ortho/neuro in [place]. She said maybe seeing a neurol will make her insurance do more.

## 2021-06-27 NOTE — Addendum Note (Signed)
Addended by: Zola Button on: 06/27/2021 03:55 PM     Modules accepted: Orders

## 2021-07-05 ENCOUNTER — Telehealth (INDEPENDENT_AMBULATORY_CARE_PROVIDER_SITE_OTHER): Payer: Self-pay | Admitting: Family Medicine

## 2021-07-05 NOTE — Telephone Encounter (Signed)
Patient called wanting to know if you would send her something in for acid reflux?

## 2021-07-09 ENCOUNTER — Other Ambulatory Visit (INDEPENDENT_AMBULATORY_CARE_PROVIDER_SITE_OTHER): Payer: Self-pay | Admitting: Family Medicine

## 2021-07-09 MED ORDER — OMEPRAZOLE 40 MG CAPSULE,DELAYED RELEASE
40.0000 mg | DELAYED_RELEASE_CAPSULE | Freq: Every day | ORAL | 4 refills | Status: DC
Start: 2021-07-09 — End: 2023-08-03

## 2021-07-31 ENCOUNTER — Other Ambulatory Visit (INDEPENDENT_AMBULATORY_CARE_PROVIDER_SITE_OTHER): Payer: Self-pay | Admitting: Family Medicine

## 2021-08-03 ENCOUNTER — Other Ambulatory Visit: Payer: Self-pay

## 2021-08-03 ENCOUNTER — Emergency Department
Admission: EM | Admit: 2021-08-03 | Discharge: 2021-08-03 | Disposition: A | Discharge status: Home / Self Care | Payer: 59

## 2021-08-03 ENCOUNTER — Encounter (HOSPITAL_BASED_OUTPATIENT_CLINIC_OR_DEPARTMENT_OTHER): Payer: Self-pay

## 2021-08-03 ENCOUNTER — Emergency Department (HOSPITAL_BASED_OUTPATIENT_CLINIC_OR_DEPARTMENT_OTHER): Payer: 59

## 2021-08-03 DIAGNOSIS — M25512 Pain in left shoulder: Secondary | ICD-10-CM | POA: Insufficient documentation

## 2021-08-03 DIAGNOSIS — S46912S Strain of unspecified muscle, fascia and tendon at shoulder and upper arm level, left arm, sequela: Secondary | ICD-10-CM

## 2021-08-03 DIAGNOSIS — S46912A Strain of unspecified muscle, fascia and tendon at shoulder and upper arm level, left arm, initial encounter: Secondary | ICD-10-CM | POA: Insufficient documentation

## 2021-08-03 HISTORY — DX: Essential (primary) hypertension: I10

## 2021-08-03 HISTORY — DX: Postprocedural hypoparathyroidism: E89.2

## 2021-08-03 MED ORDER — DEXAMETHASONE SODIUM PHOSPHATE (PF) 10 MG/ML INJECTION SOLUTION
10.0000 mg | INTRAMUSCULAR | Status: AC
Start: 2021-08-03 — End: 2021-08-03
  Administered 2021-08-03: 10 mg via INTRAMUSCULAR

## 2021-08-03 MED ORDER — KETOROLAC 30 MG/ML (1 ML) INJECTION SOLUTION
INTRAMUSCULAR | Status: AC
Start: 2021-08-03 — End: 2021-08-03
  Filled 2021-08-03: qty 1

## 2021-08-03 MED ORDER — TIZANIDINE 2 MG TABLET
2.0000 mg | ORAL_TABLET | Freq: Three times a day (TID) | ORAL | 0 refills | Status: AC | PRN
Start: 2021-08-03 — End: 2021-08-10

## 2021-08-03 MED ORDER — DEXAMETHASONE SODIUM PHOSPHATE (PF) 10 MG/ML INJECTION SOLUTION
INTRAMUSCULAR | Status: AC
Start: 2021-08-03 — End: 2021-08-03
  Filled 2021-08-03: qty 1

## 2021-08-03 MED ORDER — NAPROXEN 500 MG TABLET
500.0000 mg | ORAL_TABLET | Freq: Two times a day (BID) | ORAL | 0 refills | Status: DC
Start: 2021-08-03 — End: 2022-01-02

## 2021-08-03 MED ORDER — KETOROLAC 30 MG/ML (1 ML) INJECTION SOLUTION
30.0000 mg | INTRAMUSCULAR | Status: AC
Start: 2021-08-03 — End: 2021-08-03
  Administered 2021-08-03: 30 mg via INTRAMUSCULAR

## 2021-08-03 NOTE — ED Provider Notes (Signed)
[facility] Emergency Department  ED Primary Provider Note  History of Present Illness   Chief Complaint   Patient presents with   . Arm Pain     Taylor Smith is a 51 y.o. female who had concerns including Arm Pain.  Arrival: The patient arrived by Car    51 yrs old female pt presents to ED with complaints of the left shoulder and upper arm pain for the last 2 months and it got worse today. Pt states that she has hx of spinal stenosis and has an appt with orthopedic in May. Pt states that when she was bending down and reaching something her left shoulder/arm region had popping sounds and after that she has been having an issue with ROM and pain. Pt denies chest pain or SOB. Pt states that she can not lay down on her left shoulder. No pain with out ROM. No other complaints. No acute distress.         Review of Systems   Pertinent positive and negative ROS as per HPI.  Review of Systems   Constitutional: Negative.    HENT: Negative.    Eyes: Negative.    Respiratory: Negative.    Cardiovascular: Negative.    Gastrointestinal: Negative.    Genitourinary: Negative.    Musculoskeletal: Positive for joint swelling.        Left shoulder/proximal humerus pain   Skin: Negative.    Neurological: Negative.    Psychiatric/Behavioral: Negative.      Historical Data   History Reviewed This Encounter: Medical History  Surgical History  Family History  Social History      Physical Exam   ED Triage Vitals [08/03/21 1311]   BP (Non-Invasive) 133/78   Heart Rate 64   Respiratory Rate 20   Temperature 36.4 C (97.5 F)   SpO2 96 %   Weight 95.3 kg (210 lb)   Height 1.753 m ('5\' 9"'$ )     Physical Exam  Vitals and nursing note reviewed.   Constitutional:       Appearance: Normal appearance.   HENT:      Head: Normocephalic and atraumatic.   Eyes:      Pupils: Pupils are equal, round, and reactive to light.   Cardiovascular:      Rate and Rhythm: Normal rate.      Pulses: Normal pulses.       Heart sounds: Normal heart sounds.   Pulmonary:      Effort: Pulmonary effort is normal.      Breath sounds: Normal breath sounds.   Abdominal:      General: Bowel sounds are normal.      Palpations: Abdomen is soft.   Musculoskeletal:      Cervical back: Normal range of motion and neck supple.      Comments: Left shoulder/proximal humerus pain with ROM   Skin:     General: Skin is warm.   Neurological:      General: No focal deficit present.      Mental Status: She is alert and oriented to person, place, and time.   Psychiatric:         Mood and Affect: Mood normal.         Behavior: Behavior normal.       Patient Data   Labs Ordered/Reviewed - No data to display  XR SHOULDER LEFT   Final Result by Edi, Radresults In (04/15 1401)   NEGATIVE SHOULDER SERIES  Radiologist location ID: [place]        Medical Decision Making  Pt complaints of the left shoulder/proximal arm pain for the last 2 months. Pt states that she heard a popping sounds when she was bending down and reach over something. Pt has hx of spinal stenosis and pt has an appt with her ortho in May. No pain without ROM. Pt probably has internal shoulder injury or issue. No acute abnormality on xray. No acute abnormality. Pt is stable and has normal vital signs. Differential diagnoses; internal shoulder injury, cervical radiculopathy, rotator cuff injury     Amount and/or Complexity of Data Reviewed  Radiology: ordered.      Risk  Prescription drug management.               Medications Administered in the ED   ketorolac (TORADOL) 30 mg/mL injection (30 mg IntraMUSCULAR Given 08/03/21 1422)   dexamethasone (PF) 10 mg/mL injection (10 mg IntraMUSCULAR Given 08/03/21 1422)     Clinical Impression   Left shoulder strain, sequela (Primary)   Left shoulder pain       Disposition: Discharged

## 2021-08-03 NOTE — ED Triage Notes (Signed)
My upper arm radiates to hand, diagnosed with spinal stenosis last year and then my arm started feeling it.  I dont know if related to that or done something to my muscle - doing something a couple months ago and heard a pop and unsure if I did anything to it.  Getting worse unable to lay on it I have orthopedic appt on May 9 but hurting worse today.

## 2021-08-05 ENCOUNTER — Encounter (HOSPITAL_BASED_OUTPATIENT_CLINIC_OR_DEPARTMENT_OTHER): Payer: Self-pay

## 2021-08-05 ENCOUNTER — Emergency Department (HOSPITAL_BASED_OUTPATIENT_CLINIC_OR_DEPARTMENT_OTHER): Payer: 59

## 2021-08-05 ENCOUNTER — Emergency Department
Admission: EM | Admit: 2021-08-05 | Discharge: 2021-08-05 | Disposition: A | Discharge status: Home / Self Care | Payer: 59 | Attending: Emergency Medicine | Admitting: Emergency Medicine

## 2021-08-05 ENCOUNTER — Other Ambulatory Visit: Payer: Self-pay

## 2021-08-05 ENCOUNTER — Other Ambulatory Visit (INDEPENDENT_AMBULATORY_CARE_PROVIDER_SITE_OTHER): Payer: Self-pay | Admitting: Family Medicine

## 2021-08-05 DIAGNOSIS — R918 Other nonspecific abnormal finding of lung field: Secondary | ICD-10-CM | POA: Insufficient documentation

## 2021-08-05 DIAGNOSIS — E209 Hypoparathyroidism, unspecified: Secondary | ICD-10-CM | POA: Insufficient documentation

## 2021-08-05 DIAGNOSIS — R1314 Dysphagia, pharyngoesophageal phase: Secondary | ICD-10-CM | POA: Insufficient documentation

## 2021-08-05 MED ORDER — HYOSCYAMINE 0.125 MG/5 ML ORAL ELIXIR
10.0000 mL | ORAL_SOLUTION | Freq: Once | ORAL | Status: AC
Start: 2021-08-05 — End: 2021-08-05
  Administered 2021-08-05: 10 mL via ORAL

## 2021-08-05 MED ORDER — HYOSCYAMINE 0.125 MG/5 ML ORAL ELIXIR
ORAL_SOLUTION | ORAL | Status: AC
Start: 2021-08-05 — End: 2021-08-05
  Filled 2021-08-05: qty 10

## 2021-08-05 MED ORDER — ALUMINUM-MAG HYDROXIDE-SIMETHICONE 200 MG-200 MG-20 MG/5 ML ORAL SUSP
ORAL | Status: AC
Start: 2021-08-05 — End: 2021-08-05
  Filled 2021-08-05: qty 30

## 2021-08-05 MED ORDER — LIDOCAINE HCL 2 % MUCOSAL SOLUTION
15.0000 mL | Freq: Once | Status: AC
Start: 2021-08-05 — End: 2021-08-05
  Administered 2021-08-05: 15 mL via ORAL

## 2021-08-05 MED ORDER — ALUMINUM-MAG HYDROXIDE-SIMETHICONE 200 MG-200 MG-20 MG/5 ML ORAL SUSP
30.0000 mL | Freq: Once | ORAL | Status: AC
Start: 2021-08-05 — End: 2021-08-05
  Administered 2021-08-05: 30 mL via ORAL

## 2021-08-05 MED ORDER — LIDOCAINE HCL 2 % MUCOSAL SOLUTION
Status: AC
Start: 2021-08-05 — End: 2021-08-05
  Filled 2021-08-05: qty 15

## 2021-08-05 NOTE — ED Attending Note (Signed)
[place] emergency department         HISTORY OF PRESENT ILLNESS     Date:  08/05/2021  Patient's Name:  Taylor Smith  Date of Birth:  11-13-1970    The patient is a 51 year old who stated that around 10:00 a.m. this morning she ate a banana and since then she has had a choking sensation in her a upper esophagus.  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  Patient denies nausea and vomiting she has been able to tolerate liquids by mouth.  Patient denies cough fever          Review of Systems     Review of Systems   Constitutional: Negative.    HENT: Positive for trouble swallowing.    Respiratory: Positive for chest tightness.    Cardiovascular: Negative.    Genitourinary: Negative.    Musculoskeletal: Negative.    Skin: Negative.    Neurological: Negative.    Psychiatric/Behavioral: Negative.    All other systems reviewed and are negative.      Previous History     Past Medical History:  Past Medical History:   Diagnosis Date   . HTN (hypertension)    . 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 Hx cesarean section     . Hx cholecystectomy     . Hx cyst removal Right        Social History:  Social History     Tobacco Use   . Smoking status: Never   . Smokeless tobacco: Never   Vaping Use   . Vaping Use: Never used   Substance Use Topics   . Alcohol use: Never   . Drug use: Never     Social History     Substance and Sexual Activity   Drug Use Never       Family History:  No family history on file.    Medication History:  Current Outpatient Medications   Medication Sig   . levothyroxine (SYNTHROID) 125 mcg Oral Tablet TAKE 1 TABLET BY MOUTH EVERY DAY   . lisinopriL (PRINIVIL) 40 mg Oral Tablet TAKE 1 TABLET BY MOUTH EVERY DAY   . naproxen (NAPROSYN) 500 mg Oral Tablet Take 1 Tablet (500 mg total) by mouth Twice daily  with food   . omeprazole (PRILOSEC) 40 mg Oral Capsule, Delayed Release(E.C.) Take 1 Capsule (40 mg total) by mouth Once a day   . tiZANidine (ZANAFLEX) 2 mg Oral Tablet Take 1 Tablet (2 mg total) by mouth Three times a day as needed for up to 7 days       Allergies:  No Known Allergies    Physical Exam     Vitals:    BP 137/82   Pulse 68   Temp 36.5 C (97.7 F)   Resp 17   Ht 1.753 m ('5\' 9"'$ )   Wt 95.3 kg (210 lb)   SpO2 98%   BMI 31.01 kg/m           Physical Exam  Vitals and nursing note reviewed.   Constitutional:       General: She is not in acute distress.     Appearance: She is well-developed.   HENT:      Head: Normocephalic and atraumatic.   Eyes:  Conjunctiva/sclera: Conjunctivae normal.   Cardiovascular:      Rate and Rhythm: Normal rate and regular rhythm.      Heart sounds: No murmur heard.  Pulmonary:      Effort: Pulmonary effort is normal. No respiratory distress.      Breath sounds: Normal breath sounds.   Abdominal:      Palpations: Abdomen is soft.      Tenderness: There is no abdominal tenderness.   Musculoskeletal:         General: No swelling.      Cervical back: Neck supple.   Skin:     General: Skin is warm and dry.      Capillary Refill: Capillary refill takes less than 2 seconds.   Neurological:      Mental Status: She is alert.   Psychiatric:         Mood and Affect: Mood normal.         Diagnostic Studies/Treatment     Medications:  Medications Administered in the ED   aluminum-magnesium hydroxide-simethicone (MAG-AL PLUS) 200-200-20 mg per 5 mL oral liquid (30 mL Oral Given 08/05/21 2103)     And   hyoscyamine (HYOSYNE) 0.125 mg per 5 mL oral liquid (10 mL Oral Given 08/05/21 2103)     And   lidocaine (XYLOCAINE) 2% oral topical viscous solution (15 mL Oral Given 08/05/21 2103)       New Prescriptions    No medications on file       Labs:    No results found for this or any previous visit (from the past 12 hour(s)).     Radiology:  CT SOFT TISSUE NECK WO IV CONTRAST    CT SOFT  TISSUE NECK WO IV CONTRAST   Final Result   No acute abnormality.    Findings as above with right lung nodules.    No obstruction appreciated.    Other findings as above.         SOLID NODULES - MULTIPLE   LOW RISK    <33m: No routine follow-up    "e6 mm: CT at 3'6 months, then consider CT at 18'24 months   HIGH RISK   <681m Optional CT at 12 months   "e6m30mCT at 3'6 months, then at 18'24 months   (Guidelines for Management of Incidental Pulmonary Nodules Detected on CT Images: From the Fleischner Society 2017)                  One or more dose reduction techniques were used (e.g., Automated exposure control, adjustment of the mA and/or kV according to patient size, use of iterative reconstruction technique).         Radiologist location ID: [id number]          ECG:  NONE            Differential diagnosis  Dysphagia, choking episode    Course/Disposition/Plan     Course:  CT negative patient referred to GI Dr. PatPosey ProntoED Course as of 08/05/21 2107   Mon Aug 05, 2021   2105 CT soft tissue neck negative     Patient with history of thyroid surgery dysphagia since eating a banana but tolerating liquids.  Disposition:    Discharged    Condition at Disposition:      Stable  Follow up:   PatNetty StarringD [place]  [phone] Schedule an  appointment as soon as possible for a visit         Clinical Impression:     Clinical Impression   Pharyngoesophageal dysphagia (Primary)         Winfred Burn, MD

## 2021-08-05 NOTE — Discharge Instructions (Signed)
Follow-up outpatient Gastroenterology

## 2021-08-05 NOTE — ED Triage Notes (Signed)
About 1000 ate a banana and feels like it is lodged in her throat.  States she has been drinking water all day but can not get it to go down. States she is able to breath fine.

## 2021-08-05 NOTE — ED Nurses Note (Signed)
Patient discharged home with family.  AVS reviewed with patient/care giver.  A written copy of the AVS and discharge instructions was given to the patient/care giver. Scripts handed to patient/care giver. Questions sufficiently answered as needed.  Patient/care giver encouraged to follow up with PCP as indicated.  In the event of an emergency, patient/care giver instructed to call 911 or go to the nearest emergency room.

## 2021-08-18 ENCOUNTER — Other Ambulatory Visit: Payer: Self-pay

## 2021-08-29 ENCOUNTER — Other Ambulatory Visit: Payer: 59 | Attending: Obstetrics & Gynecology | Admitting: Obstetrics & Gynecology

## 2021-08-29 DIAGNOSIS — R87612 Low grade squamous intraepithelial lesion on cytologic smear of cervix (LGSIL): Secondary | ICD-10-CM | POA: Insufficient documentation

## 2021-08-30 DIAGNOSIS — N870 Mild cervical dysplasia: Secondary | ICD-10-CM

## 2021-08-30 DIAGNOSIS — R87612 Low grade squamous intraepithelial lesion on cytologic smear of cervix (LGSIL): Secondary | ICD-10-CM

## 2021-08-30 LAB — SURGICAL PATHOLOGY SPECIMEN

## 2021-09-03 ENCOUNTER — Telehealth (INDEPENDENT_AMBULATORY_CARE_PROVIDER_SITE_OTHER): Payer: Self-pay | Admitting: Family Medicine

## 2021-09-03 DIAGNOSIS — M25512 Pain in left shoulder: Secondary | ICD-10-CM

## 2021-09-03 NOTE — Telephone Encounter (Signed)
Patient called stating she is having terrible pain in her left shoulder. She thinks she has torn something, she went to ER on 08/03/21 and they did an Xray which was clear. She said she was at the spine doctor last week but they told her it was an ortho issue to see them. I told her before we refer they probably will want an MRI.   Please Advise.

## 2021-09-03 NOTE — Telephone Encounter (Signed)
Referral placed.  Patient informed.

## 2021-09-25 IMAGING — MR MRI SHOULDER LT W/O CONTRAST
6 series · 38 of 40 positions shown · IV contrast (gadolinium)
Comparison: None available.

﻿EXAM:  78880   MRI SHOULDER LT W/O CONTRAST
INDICATION: Pain radiating down the arm.
TECHNIQUE: Multiplanar multisequential MRI of the left shoulder joint was performed without gadolinium contrast.

[Series 6: T1 · oblique · left · 3.5mm · 0.33mm/px · 7 of 18 slices shown]
[im 1/18]
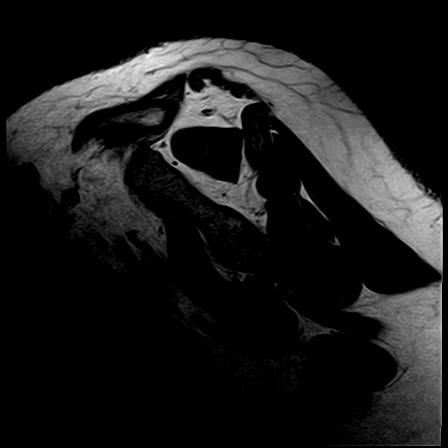
[im 3/18]
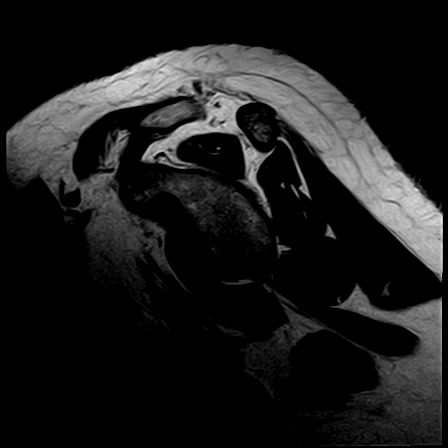
[im 6/18]
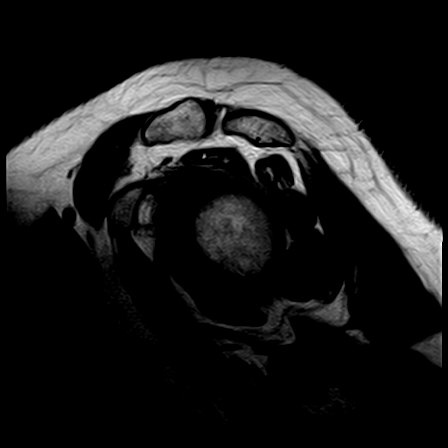
[im 9/18]
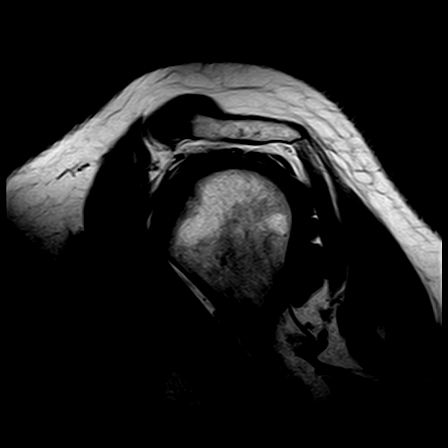
[im 12/18]
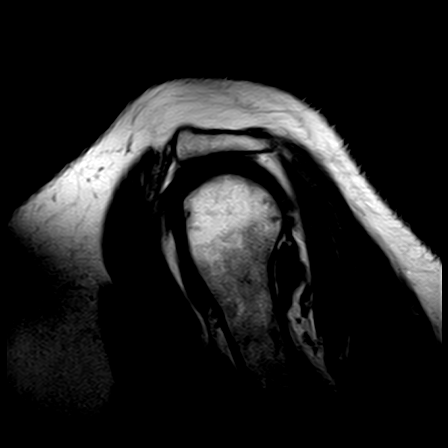
[im 15/18]
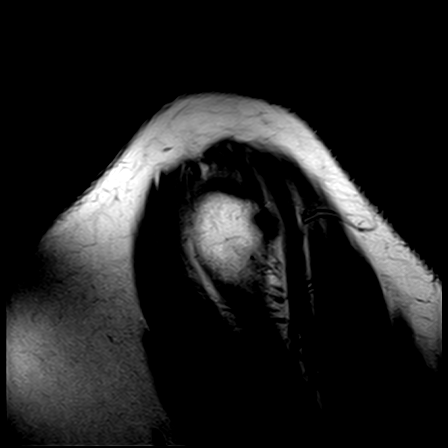
[im 18/18]
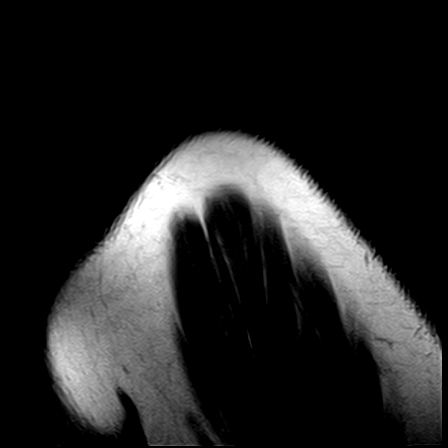

[Series 7: STIR · oblique · left · 3.5mm · 0.47mm/px · 7 of 18 slices shown (1 of 2)]
[im 1/18]
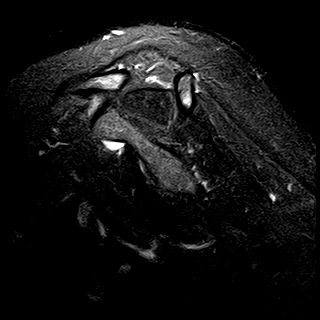
[im 3/18]
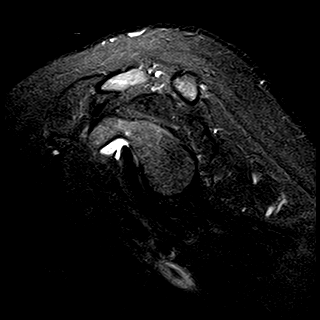
[im 6/18]
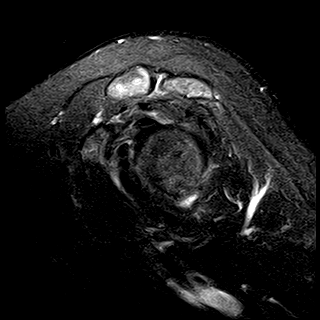
[im 9/18]
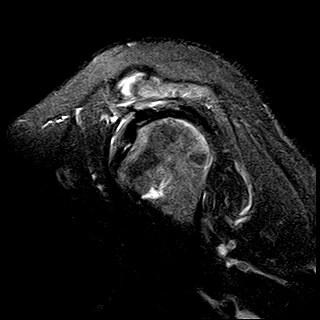
[im 12/18]
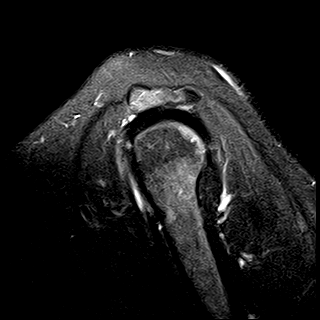
[im 15/18]
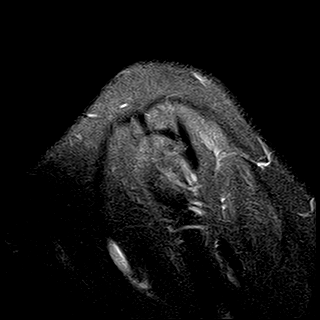
[im 18/18]
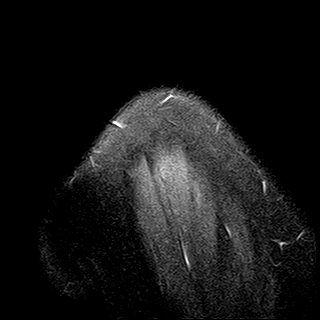

[Series 8: PD fat-sat · axial · left · 4.0mm · 0.50mm/px · z∈[-47,+29]mm · 6 of 18 slices shown (1 of 2)]
[im 1/18]
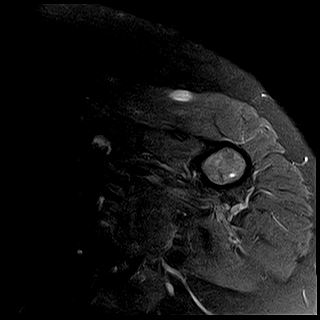
[im 4/18]
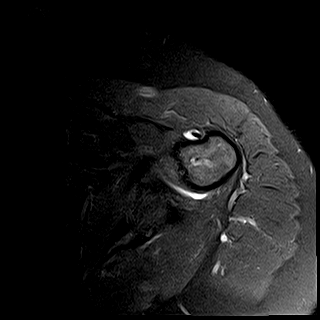
[im 7/18]
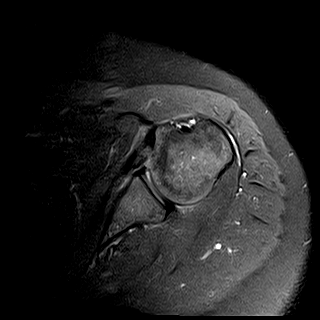
[im 11/18]
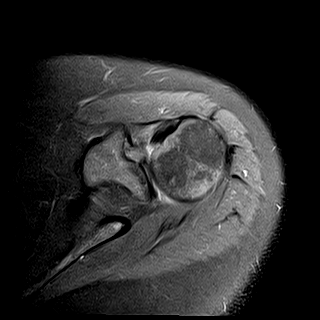
[im 14/18]
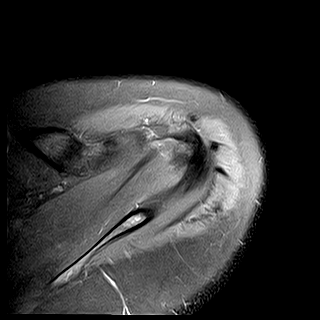
[im 18/18]
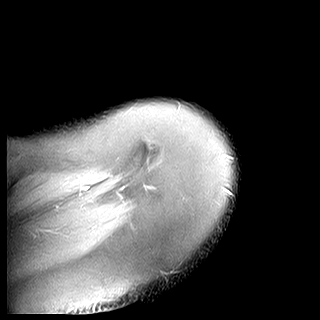

[Series 9: T2 fat-sat · axial · left · 4.0mm · 0.42mm/px · z∈[-61,+43]mm · 8 of 24 slices shown]
[im 1/24]
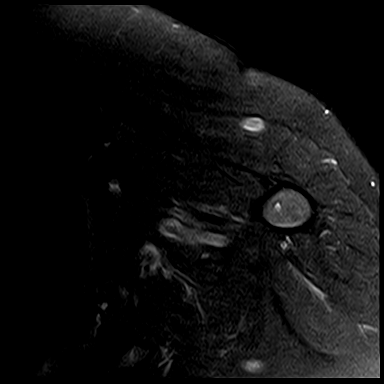
[im 4/24]
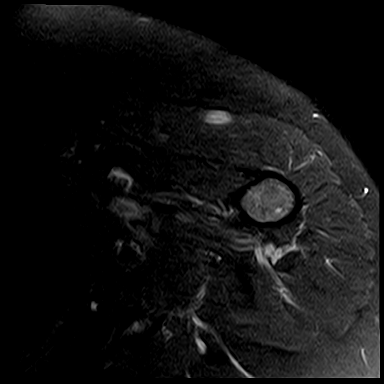
[im 7/24]
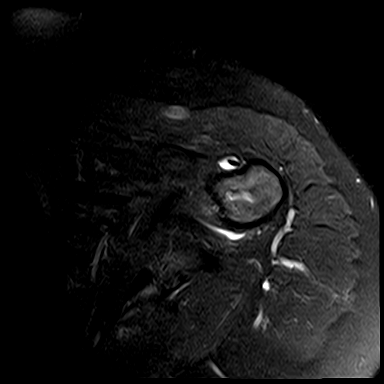
[im 10/24]
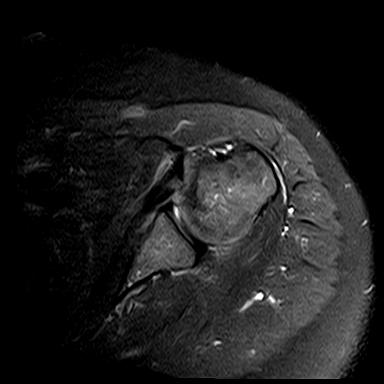
[im 14/24]
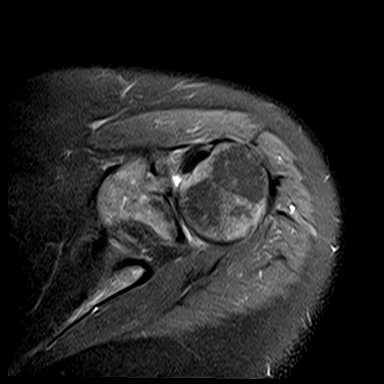
[im 17/24]
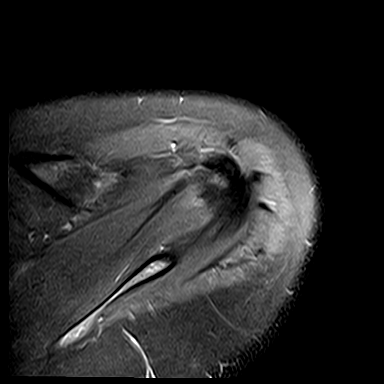
[im 20/24]
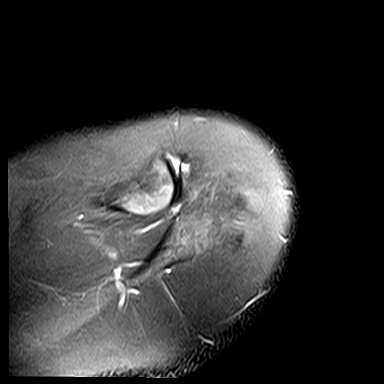
[im 24/24]
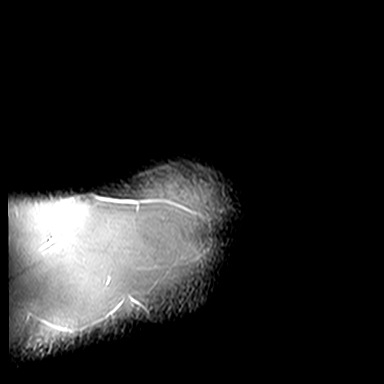

[Series 10: PD fat-sat · oblique · left · 3.5mm · 0.47mm/px · 6 of 18 slices shown (2 of 2)]
[im 1/18]
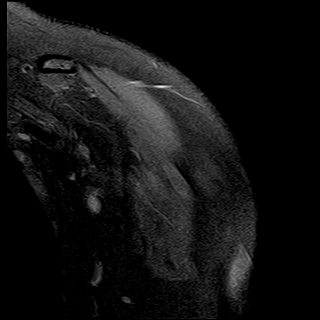
[im 4/18]
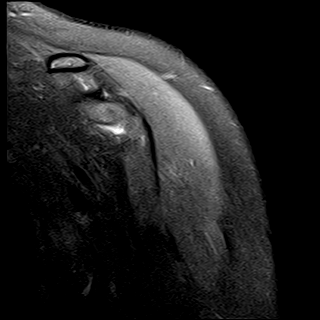
[im 7/18]
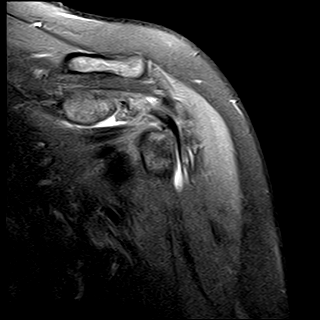
[im 11/18]
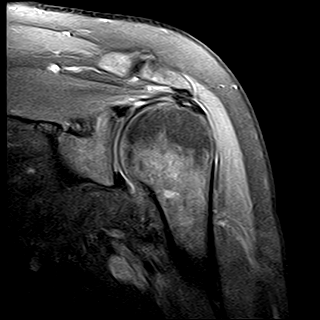
[im 14/18]
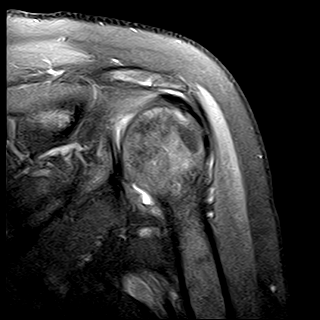
[im 18/18]
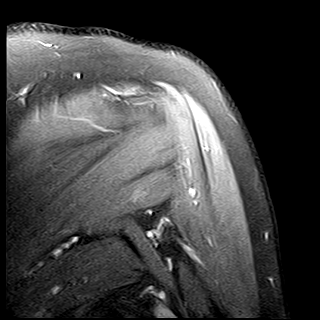

[Series 11: STIR · oblique · left · 3.5mm · 0.47mm/px · 4 of 18 slices shown (2 of 2)]
[im 1/18]
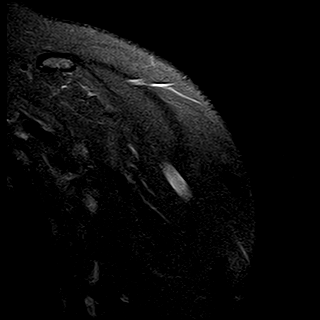
[im 4/18]
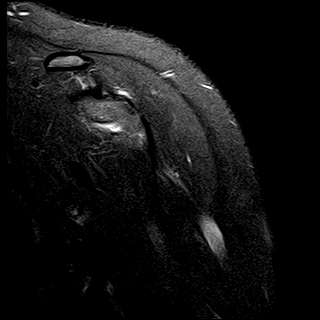
[im 7/18]
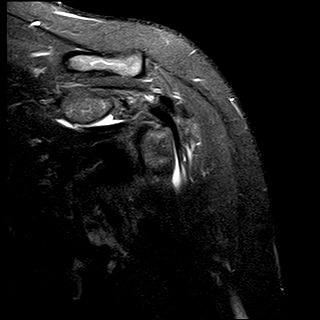
[im 11/18]
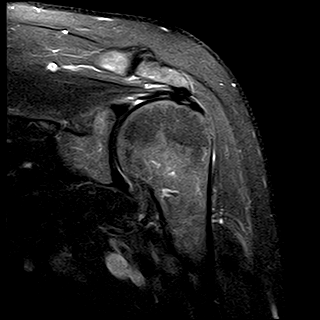

[38 of 40 positions shown; findings below may reference images not displayed]

FINDINGS: Supraspinatus, infraspinatus, teres minor and subscapularis muscles and tendons are within normal limits in morphology and signal intensity. Long head of biceps tendon is well seated within the intertubercular groove and attaches normally to the biceps anchor. Superior labrum is intact. Glenohumeral articular cartilage is well maintained.  There is mild acromioclavicular joint osteoarthritis.  No significant fluid is noted within the subacromial/subdeltoid bursa. Muscle bulk and bone marrow signal intensity are normal. No mass is seen along the course of the suprascapular nerve, within the spinoglenoid notch or within the quadrilateral space.
IMPRESSION: 1. Intact rotator cuff and superior labrum. 

2. Mild acromioclavicular joint osteoarthritis.

## 2021-10-21 ENCOUNTER — Telehealth (INDEPENDENT_AMBULATORY_CARE_PROVIDER_SITE_OTHER): Payer: Self-pay | Admitting: Family Medicine

## 2021-10-21 ENCOUNTER — Encounter (HOSPITAL_BASED_OUTPATIENT_CLINIC_OR_DEPARTMENT_OTHER): Payer: Self-pay

## 2021-10-21 ENCOUNTER — Other Ambulatory Visit: Payer: Self-pay

## 2021-10-21 ENCOUNTER — Emergency Department
Admission: EM | Admit: 2021-10-21 | Discharge: 2021-10-22 | Disposition: A | Discharge status: Home / Self Care | Payer: 59 | Attending: Emergency Medicine | Admitting: Emergency Medicine

## 2021-10-21 DIAGNOSIS — N950 Postmenopausal bleeding: Secondary | ICD-10-CM | POA: Insufficient documentation

## 2021-10-21 DIAGNOSIS — E079 Disorder of thyroid, unspecified: Secondary | ICD-10-CM | POA: Insufficient documentation

## 2021-10-21 NOTE — Telephone Encounter (Signed)
Patient left a vmail stating this morning,  she is having abdominal cramping and passed some blood vaginally, she is post menopausal. She said each time she wipes there is blood. She called Dr Vara Guardian office but they are out. I told her to monitor the amount of blood she looses by wearing a pad. If at any given time she is having severe cramping and passing clots, excessive amts of blood to go immediately to ER. She is going to call them Wednesday and get in asap with him.

## 2021-10-21 NOTE — ED Triage Notes (Addendum)
Vaginal bleeding and cramping started this morning, pt has went through menopause- began in 2019. Pt states that she is noticing the blood mostly while she is peeing. Pt is having ABD bloating, flank and leg pain.

## 2021-10-22 ENCOUNTER — Other Ambulatory Visit (HOSPITAL_COMMUNITY): Payer: Self-pay | Admitting: Emergency Medicine

## 2021-10-22 ENCOUNTER — Inpatient Hospital Stay
Admission: RE | Admit: 2021-10-22 | Discharge: 2021-10-22 | Disposition: A | Discharge status: Home / Self Care | Payer: 59 | Source: Ambulatory Visit | Attending: Emergency Medicine | Admitting: Emergency Medicine

## 2021-10-22 ENCOUNTER — Encounter (HOSPITAL_COMMUNITY): Payer: Self-pay

## 2021-10-22 ENCOUNTER — Other Ambulatory Visit (HOSPITAL_COMMUNITY): Payer: Self-pay

## 2021-10-22 DIAGNOSIS — N950 Postmenopausal bleeding: Secondary | ICD-10-CM

## 2021-10-22 LAB — COMPREHENSIVE METABOLIC PANEL, NON-FASTING
ALBUMIN/GLOBULIN RATIO: 0.9 (ref 0.8–1.4)
ALBUMIN: 3.8 g/dL (ref 3.4–5.0)
ALKALINE PHOSPHATASE: 85 U/L (ref 46–116)
ALT (SGPT): 26 U/L (ref <=78)
ANION GAP: 9 mmol/L — ABNORMAL LOW (ref 10–20)
AST (SGOT): 23 U/L (ref 15–37)
BILIRUBIN TOTAL: 0.9 mg/dL (ref 0.2–1.0)
BUN/CREA RATIO: 12
BUN: 10 mg/dL (ref 7–18)
CALCIUM, CORRECTED: 9.3 mg/dL
CALCIUM: 9.1 mg/dL (ref 8.5–10.1)
CHLORIDE: 104 mmol/L (ref 98–107)
CO2 TOTAL: 28 mmol/L (ref 21–32)
CREATININE: 0.83 mg/dL (ref 0.55–1.02)
ESTIMATED GFR: 85 mL/min/{1.73_m2} (ref >59)
GLOBULIN: 4.4
GLUCOSE: 121 mg/dL — ABNORMAL HIGH (ref 74–106)
OSMOLALITY, CALCULATED: 282 mOsm/kg (ref 270–290)
POTASSIUM: 3.7 mmol/L (ref 3.5–5.1)
PROTEIN TOTAL: 8.2 g/dL (ref 6.4–8.2)
SODIUM: 141 mmol/L (ref 136–145)

## 2021-10-22 LAB — THYROID STIMULATING HORMONE (SENSITIVE TSH): TSH: 34.384 u[IU]/mL — ABNORMAL HIGH (ref 0.358–3.740)

## 2021-10-22 LAB — URINALYSIS, MACRO/MICRO
BILIRUBIN: NEGATIVE mg/dL
GLUCOSE: NEGATIVE mg/dL
KETONES: NEGATIVE mg/dL
LEUKOCYTES: NEGATIVE WBCs/uL
NITRITE: NEGATIVE
PH: 6 (ref 4.6–8.0)
PROTEIN: NEGATIVE mg/dL
SPECIFIC GRAVITY: <=1.005 (ref 1.003–1.035)
UROBILINOGEN: 0.2 mg/dL (ref 0.2–1.0)

## 2021-10-22 LAB — CBC WITH DIFF
BASOPHIL #: 0.01 10*3/uL (ref 0.00–0.30)
BASOPHIL %: 0 % (ref 0–3)
EOSINOPHIL #: 0.15 10*3/uL (ref 0.00–0.80)
EOSINOPHIL %: 3 % (ref 0–7)
HCT: 54.4 % — ABNORMAL HIGH (ref 37.0–47.0)
HGB: 17.4 g/dL — ABNORMAL HIGH (ref 12.5–16.0)
LYMPHOCYTE #: 1.41 10*3/uL (ref 1.10–5.00)
LYMPHOCYTE %: 26 % (ref 25–45)
MCH: 28.7 pg (ref 27.0–32.0)
MCHC: 32 g/dL (ref 32.0–36.0)
MCV: 89.5 fL (ref 78.0–99.0)
MONOCYTE #: 0.48 10*3/uL (ref 0.00–1.30)
MONOCYTE %: 9 % (ref 0–12)
MPV: 10 fL (ref 7.4–10.4)
NEUTROPHIL #: 3.45 10*3/uL (ref 1.80–8.40)
NEUTROPHIL %: 63 % (ref 40–76)
PLATELETS: 144 10*3/uL (ref 140–440)
RBC: 6.08 10*6/uL — ABNORMAL HIGH (ref 4.20–5.40)
RDW: 16 % — ABNORMAL HIGH (ref 11.6–14.8)
WBC: 5.5 10*3/uL (ref 4.0–10.5)

## 2021-10-22 LAB — URINALYSIS, MICROSCOPIC

## 2021-10-22 NOTE — ED Nurses Note (Signed)
Patient discharged home with family.  AVS reviewed with patient/care giver.  A written copy of the AVS and discharge instructions was given to the patient/care giver. Scripts handed to patient/care giver. Questions sufficiently answered as needed.  Patient/care giver encouraged to follow up with PCP as indicated.  In the event of an emergency, patient/care giver instructed to call 911 or go to the nearest emergency room.

## 2021-10-22 NOTE — ED Nurses Note (Signed)
Patient states that she is post menopausal states that she has been cramping really bad. Patient states that she noticed of bleeding that has progressively gotten worst. Patient states lower abd,back, and leg cramping.

## 2021-10-22 NOTE — Discharge Instructions (Signed)
You were seen emergency department for postmenopausal bleeding.  Your evaluation did not reveal critical condition requiring hospitalization.  It is very important that you follow with your OB and primary care ideally within the next 2-3 days for re-evaluation.  I did give you an order for outpatient transvaginal ultrasound.  Ensure those results were communicated to your primary care provider and OB.  Return to emergency department if he have bleeding that is profuse and he becomes dizzy or have chest pain or any other concerning symptoms.  Thank you for visiting [facility].

## 2021-10-22 NOTE — ED Provider Notes (Signed)
[facility] Emergency Department  ED Primary Provider Note  HPI:  Taylor Smith is a 51 y.o. female   Patient reports 2 days of vaginal bleeding initially started out heavy and then there were some spotting in his picked up again.  She reports associated cramping as if she is menstruating.  Pain is worse with urination.  Patient is postmenopausal.  She has a history of thyroid dysfunction and is on Synthroid.  She is a strong family history of malignancy.  Patient is COVID vaccinated.  Full code.    ROS review and negative aside from stated in HPI.    Physical Exam:  ED Triage Vitals [10/21/21 2229]   BP (Non-Invasive) (!) 186/98   Heart Rate 63   Respiratory Rate 18   Temperature 36.1 C (97 F)   SpO2 97 %   Weight 93 kg (205 lb)   Height 1.753 m ('5\' 9"' )     No acute distress.  Patient awake alert oriented x3.  Mood is appropriate.  Pupils 3 mm equal round reactive.  Extraocular movements are intact.  Oropharynx is clear.  Mucous membranes moist.  Trachea midline.  Neck is supple.  Heart has regular rate and rhythm without significant murmurs rubs or gallops.  Lungs are clear to auscultation.  Abdomen soft nontender, nondistended.  Deferred GU exam as a shared decision Moving all extremities without difficulty.  No rash no edema.      Patient data:  Labs Ordered/Reviewed   COMPREHENSIVE METABOLIC PANEL, NON-FASTING - Abnormal; Notable for the following components:       Result Value    ANION GAP 9 (*)     GLUCOSE 121 (*)     All other components within normal limits    Narrative:     Estimated Glomerular Filtration Rate (eGFR) is calculated using the CKD-EPI (2021) equation, intended for patients 66 years of age and older. If gender is not documented or "unknown", there will be no eGFR calculation.   CBC WITH DIFF - Abnormal; Notable for the following components:    RBC 6.08 (*)     HGB 17.4 (*)     HCT 54.4 (*)     RDW 16.0 (*)     All other components within normal  limits   URINALYSIS, MACRO/MICRO - Abnormal; Notable for the following components:    BLOOD Trace (*)     All other components within normal limits   THYROID STIMULATING HORMONE (SENSITIVE TSH) - Abnormal; Notable for the following components:    TSH 34.384 (*)     All other components within normal limits   URINALYSIS, MICROSCOPIC - Abnormal; Notable for the following components:    BACTERIA Rare (*)     All other components within normal limits   CBC/DIFF    Narrative:     The following orders were created for panel order CBC/DIFF.  Procedure                               Abnormality         Status                     ---------                               -----------         ------  CBC WITH DIFF[[id number]]                Abnormal            Final result                 Please view results for these tests on the individual orders.   URINALYSIS WITH REFLEX MICROSCOPIC AND CULTURE IF POSITIVE    Narrative:     The following orders were created for panel order URINALYSIS WITH REFLEX MICROSCOPIC AND CULTURE IF POSITIVE.  Procedure                               Abnormality         Status                     ---------                               -----------         ------                     URINALYSIS, MACRO/MICRO[[id number]]      Abnormal            Final result               URINALYSIS, MICROSCOPIC[[id number]]      Abnormal            Final result                 Please view results for these tests on the individual orders.     No orders to display       MDM:  Postmenopausal vaginal bleeding.  DX includes bleeding dyscrasia, malignancy, structural lesions including leiomyoma, polyp, endometriosis as well as endocrine dysfunction.  Vitals within normal limits.  Benign exam.  GU exam deferred as a shared decision.  Lab work shows a TSH of 34 which suggest under treatment of hypothyroidism.  Free T4 is pending.  Hemoglobin hematocrit 17 and 54.  Trace blood noted in the urine no other abnormalities  noted.  Cortisol levels have been ordered.  I had extensive discussion with the patient and review these findings.  I provided written information from up-to-date.  We did discuss the possibility of malignancy amongst other diagnoses.  I did offer CT imaging as there maybe a delay in patient's seeing Ob due to holiday weekend.  Patient declined I did order a transvaginal ultrasound for the patient to accelerate her outpatient workup I prison provided anticipatory guidance referred patient to primary care and OB.  I gave strict return to ED instructions both verbal and written manner.        Discharged  Clinical Impression   Postmenopausal bleeding (Primary)           Current Discharge Medication List      CONTINUE these medications - NO CHANGES were made during your visit.      Details   levothyroxine 125 mcg Tablet  Commonly known as: SYNTHROID   TAKE 1 TABLET BY MOUTH EVERY DAY  Qty: 90 Tablet  Refills: 0     lisinopriL 40 mg Tablet  Commonly known as: PRINIVIL   TAKE 1 TABLET BY MOUTH EVERY DAY  Qty: 90 Tablet  Refills: 1     naproxen 500 mg Tablet  Commonly known as: NAPROSYN   500 mg, Oral, 2 TIMES DAILY WITH FOOD  Qty: 30 Tablet  Refills: 0     omeprazole 40 mg Capsule, Delayed Release(E.C.)  Commonly known as: PRILOSEC   40 mg, Oral, DAILY  Qty: 90 Capsule  Refills: 4

## 2021-10-25 ENCOUNTER — Telehealth (INDEPENDENT_AMBULATORY_CARE_PROVIDER_SITE_OTHER): Payer: Self-pay

## 2021-10-25 NOTE — Telephone Encounter (Signed)
Patient left a vmail. Stated she had more bleeding and went to the ER. She said they did labs and she noted her TSH to be high. She asked if you would review the labs and make adjustment to med as needed.

## 2021-10-29 ENCOUNTER — Other Ambulatory Visit (INDEPENDENT_AMBULATORY_CARE_PROVIDER_SITE_OTHER): Payer: Self-pay | Admitting: Family Medicine

## 2021-10-29 MED ORDER — LEVOTHYROXINE 150 MCG TABLET
150.0000 ug | ORAL_TABLET | Freq: Every day | ORAL | 0 refills | Status: DC
Start: 2021-10-29 — End: 2022-01-17

## 2021-10-29 NOTE — Telephone Encounter (Signed)
Patient informed. States she has been to gyn and is getting her issues addressed.

## 2021-11-07 ENCOUNTER — Encounter (INDEPENDENT_AMBULATORY_CARE_PROVIDER_SITE_OTHER): Payer: Self-pay | Admitting: Family Medicine

## 2021-11-07 ENCOUNTER — Other Ambulatory Visit: Payer: Self-pay

## 2021-11-07 ENCOUNTER — Ambulatory Visit (INDEPENDENT_AMBULATORY_CARE_PROVIDER_SITE_OTHER): Payer: 59 | Admitting: Family Medicine

## 2021-11-07 VITALS — BP 135/82 | HR 65 | Temp 97.8°F | Ht 69.0 in | Wt 208.0 lb

## 2021-11-07 DIAGNOSIS — E039 Hypothyroidism, unspecified: Secondary | ICD-10-CM | POA: Insufficient documentation

## 2021-11-07 DIAGNOSIS — I10 Essential (primary) hypertension: Secondary | ICD-10-CM | POA: Insufficient documentation

## 2021-11-07 DIAGNOSIS — M25512 Pain in left shoulder: Secondary | ICD-10-CM

## 2021-11-07 DIAGNOSIS — K219 Gastro-esophageal reflux disease without esophagitis: Secondary | ICD-10-CM | POA: Insufficient documentation

## 2021-11-07 DIAGNOSIS — N950 Postmenopausal bleeding: Secondary | ICD-10-CM

## 2021-11-07 DIAGNOSIS — E559 Vitamin D deficiency, unspecified: Secondary | ICD-10-CM | POA: Insufficient documentation

## 2021-11-07 DIAGNOSIS — M4802 Spinal stenosis, cervical region: Secondary | ICD-10-CM | POA: Insufficient documentation

## 2021-11-07 DIAGNOSIS — M2578 Osteophyte, vertebrae: Secondary | ICD-10-CM | POA: Insufficient documentation

## 2021-11-07 NOTE — Nursing Note (Signed)
11/07/21 0849   Depression Screen   Little interest or pleasure in doing things. 0   Feeling down, depressed, or hopeless 0   PHQ 2 Total 0

## 2021-11-07 NOTE — Progress Notes (Signed)
FAMILY MEDICINE, MEDICAL OFFICE BUILDING  [place] [id number]       Name: Taylor Smith MRN:  [id number]   Date: 11/07/2021 Age: 51 y.o.          Provider: Daiva Huge, DO    Reason for visit: Follow Up 6 Months      History of Present Illness:  Taylor Smith is a 51 y.o. female here today for chronic disease management.     Insert last visit she had an episode of postmenopausal bleeding.  She was seen in the ER and then followed up with Dr. Buford Dresser has seen him multiple times and has at this time decided against biopsy since her urologic was so thin she goes back next week possibly for biopsy.  She has had another bleeding episode since July 4th.    Patient had thyroid checked July 4th in the ER and was in normal range.    She is also following with Dr. Lilia Pro for ortho has had to do physical therapy and is getting another MRI of her arm.          Historical Data    Past Medical History:  Patient Active Problem List   Diagnosis   . Vitamin D deficiency   . Hypothyroid   . GERD (gastroesophageal reflux disease)   . Primary hypertension   . Neural foraminal stenosis of cervical spine   . Osteophyte of cervical spine      Past Surgical History:  Past Surgical History:   Procedure Laterality Date   . HX CESAREAN SECTION     . HX CHOLECYSTECTOMY     . HX CYST REMOVAL Right          Allergies:  No Known Allergies  Medications:  Current Outpatient Medications   Medication Sig   . cloNIDine HCL (CATAPRES) 0.1 mg Oral Tablet Take 1 Tablet (0.1 mg total) by mouth Once a day   . ergocalciferol, vitamin D2, (DRISDOL) 1,250 mcg (50,000 unit) Oral Capsule Take 1 Capsule (50,000 Units total) by mouth Every 7 days   . levothyroxine (SYNTHROID) 150 mcg Oral Tablet Take 1 Tablet (150 mcg total) by mouth Once a day for 90 days   . lisinopriL (PRINIVIL) 40 mg Oral Tablet TAKE 1 TABLET BY MOUTH EVERY DAY   . naproxen (NAPROSYN) 500 mg Oral Tablet Take 1 Tablet (500 mg total) by mouth Twice daily with food   .  omeprazole (PRILOSEC) 40 mg Oral Capsule, Delayed Release(E.C.) Take 1 Capsule (40 mg total) by mouth Once a day     Family History:  Family Medical History:    None         Social History:  Social History     Socioeconomic History   . Marital status: Married   Tobacco Use   . Smoking status: Never   . Smokeless tobacco: Never   Vaping Use   . Vaping Use: Never used   Substance and Sexual Activity   . Alcohol use: Never   . Drug use: Never           Review of Systems:  Review of Systems   Constitutional: Negative.  Negative for chills, fever and weight loss.   HENT: Negative.  Negative for congestion, ear pain, hearing loss, sinus pain and sore throat.    Eyes: Negative.  Negative for blurred vision, pain and redness.   Respiratory: Negative.  Negative for cough, shortness of breath and wheezing.  Cardiovascular: Negative.  Negative for chest pain and palpitations.   Gastrointestinal: Negative.  Negative for abdominal pain, blood in stool, constipation, diarrhea, heartburn, nausea and vomiting.   Genitourinary: Negative.  Negative for flank pain, frequency, hematuria and urgency.   Musculoskeletal: Negative.  Negative for back pain, joint pain and neck pain.   Skin: Negative.  Negative for itching and rash.   Neurological: Negative.  Negative for speech change, seizures, weakness and headaches.   Endo/Heme/Allergies: Negative.    Psychiatric/Behavioral: Negative.         Physical Exam:  Vital Signs:  Vitals:    11/07/21 0848   BP: 135/82   Pulse: 65   Temp: 36.6 C (97.8 F)   TempSrc: Temporal   SpO2: 96%   Weight: 94.3 kg (208 lb)   Height: 1.753 m ('5\' 9"'$ )   BMI: 30.78     Physical Exam  Constitutional:       Appearance: Normal appearance. She is normal weight.   HENT:      Head: Normocephalic and atraumatic.      Nose: Nose normal.      Mouth/Throat:      Mouth: Mucous membranes are moist.      Pharynx: Oropharynx is clear.   Eyes:      Extraocular Movements: Extraocular movements intact.       Conjunctiva/sclera: Conjunctivae normal.      Pupils: Pupils are equal, round, and reactive to light.   Cardiovascular:      Rate and Rhythm: Normal rate.      Heart sounds: No murmur heard.  Pulmonary:      Effort: Pulmonary effort is normal. No respiratory distress.      Breath sounds: Normal breath sounds. No wheezing.   Abdominal:      General: Abdomen is flat. Bowel sounds are normal.      Tenderness: There is no abdominal tenderness.   Musculoskeletal:         General: Normal range of motion.      Cervical back: Normal range of motion.   Skin:     General: Skin is warm and dry.      Findings: No lesion or rash.   Neurological:      General: No focal deficit present.      Mental Status: She is alert and oriented to person, place, and time. Mental status is at baseline.      Gait: Gait normal.   Psychiatric:         Mood and Affect: Mood normal.         Behavior: Behavior normal.         Thought Content: Thought content normal.         Judgment: Judgment normal.          Assessment:    ICD-10-CM    1. Post-menopausal bleeding  N95.0       2. Vitamin D deficiency  E55.9       3. Hypothyroidism, unspecified type  E03.9       4. Gastroesophageal reflux disease, unspecified whether esophagitis present  K21.9       5. Primary hypertension  I10       6. Neural foraminal stenosis of cervical spine  M48.02       7. Osteophyte of cervical spine  M25.78       8. Left shoulder pain  M25.512            Plan:  Will let me know  Dr.Ellingtons decisions etc  Continue current meds and management.  Return to clinic 6 months.               Return in about 6 months (around 05/10/2022) for chronic disease management.    Daiva Huge, DO     Portions of this note may be dictated using voice recognition software or a dictation service. Variances in spelling and vocabulary are possible and unintentional. Not all errors are caught/corrected. Please notify the [facility] if any discrepancies are noted or if the meaning of any statement is not  clear.

## 2021-11-20 IMAGING — MR MRI UPPER EXTREMITY W/O LT
5 of 8 series · 26 of 40 positions shown · non-contrast
Comparison: None available.

﻿EXAM:  [id number]   MRI UPPER EXTREMITY W/O LT
INDICATION: Left upper extremity pain.
TECHNIQUE: Noncontrast multiplanar, multisequence MRI was performed.  A marker was placed on the skin overlying the area of interest prior to this examination.

[Series 6: T1 · axial · left · 7.0mm · 0.39mm/px · z∈[-209,+58]mm · 6 of 36 slices shown (1 of 2)]
[im 1/36]
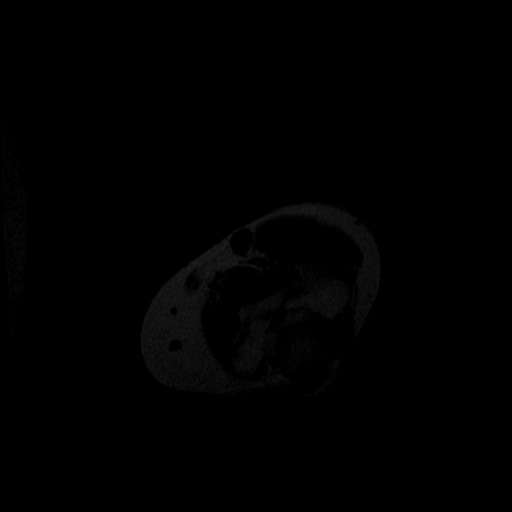
[im 8/36]
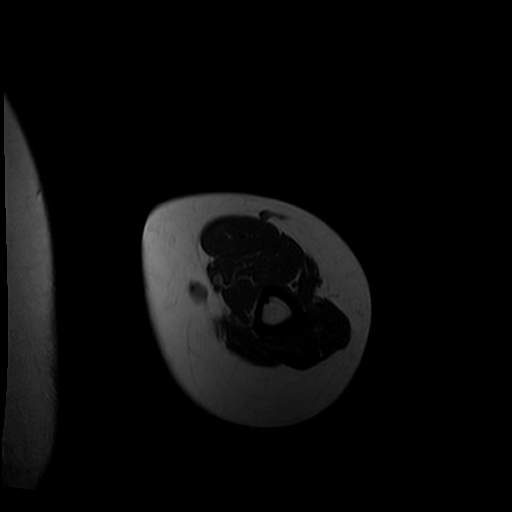
[im 15/36]
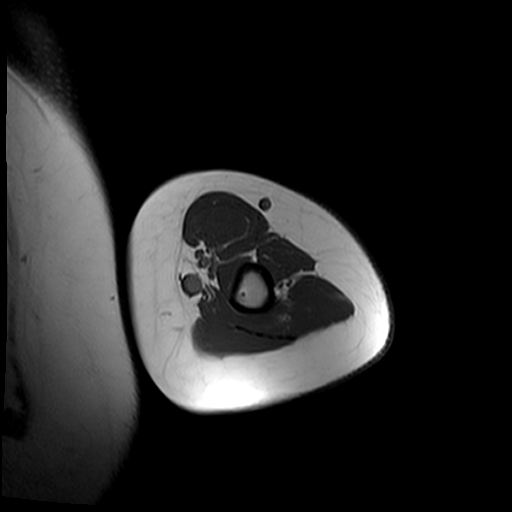
[im 22/36]
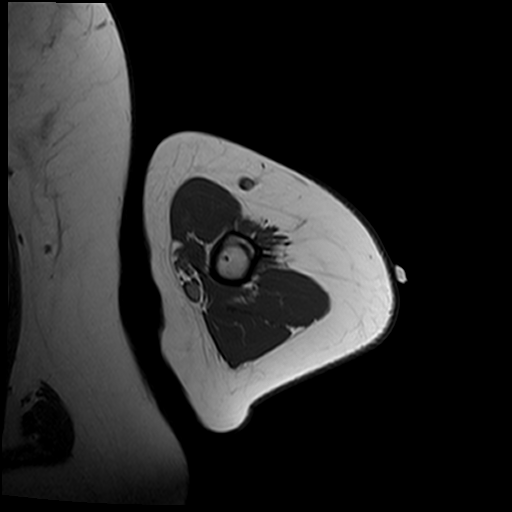
[im 29/36]
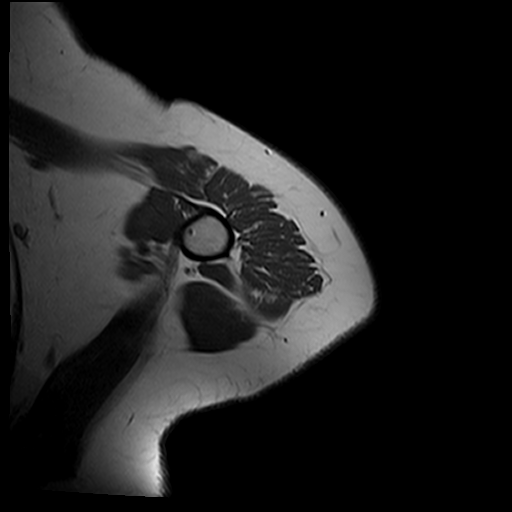
[im 36/36]
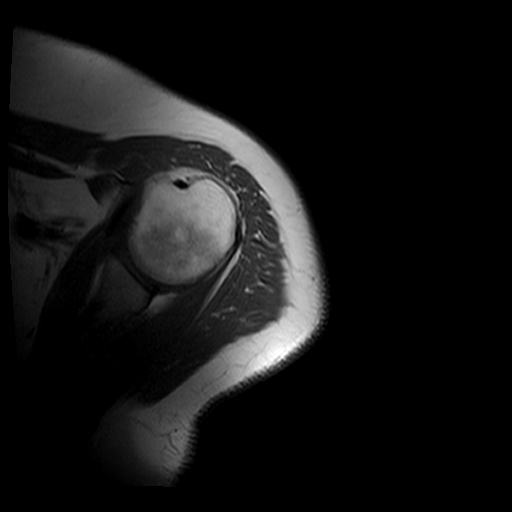

[Series 7: T2 fat-sat · axial · left · 7.0mm · 0.78mm/px · z∈[-209,+58]mm · 7 of 36 slices shown]
[im 1/36]
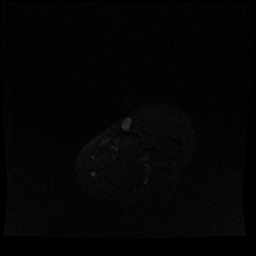
[im 6/36]
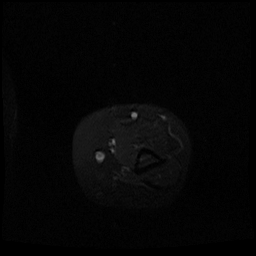
[im 12/36]
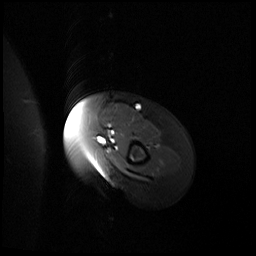
[im 18/36]
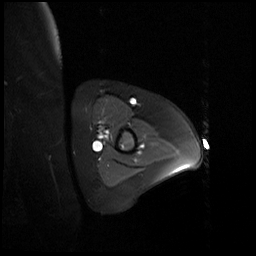
[im 24/36]
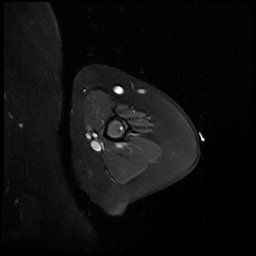
[im 30/36]
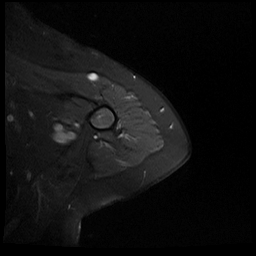
[im 36/36]
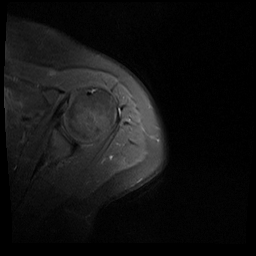

[Series 8: T1 · sagittal · left · 4.1mm · 0.89mm/px · 4 of 20 slices shown (2 of 2)]
[im 1/20]
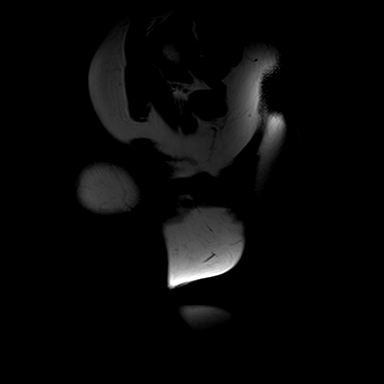
[im 7/20]
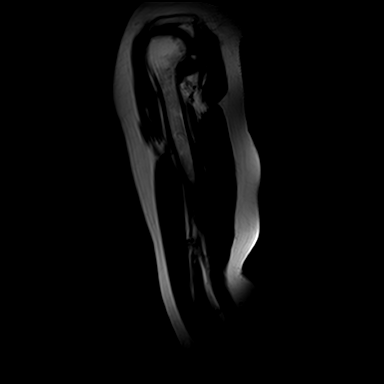
[im 13/20]
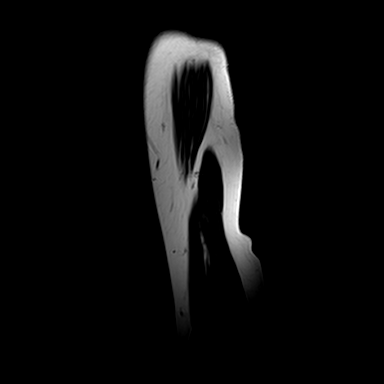
[im 20/20]
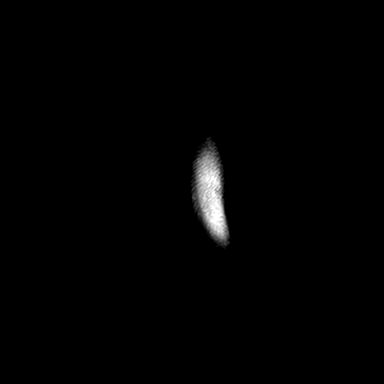

[Series 12: PD · sagittal · left · 4.1mm · 0.66mm/px · 4 of 20 slices shown]
[im 1/20]
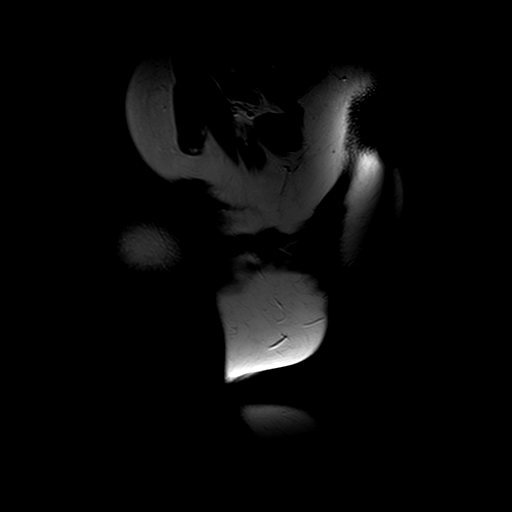
[im 7/20]
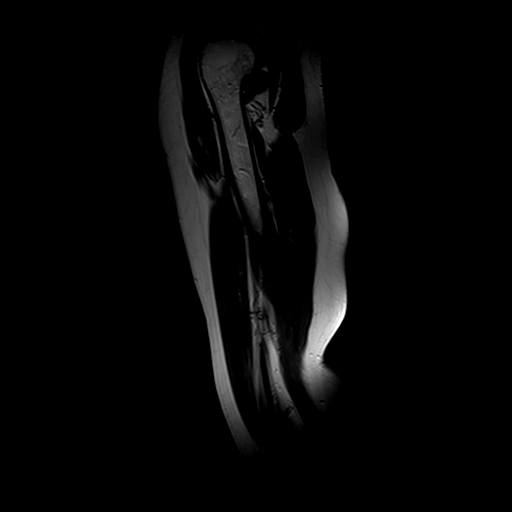
[im 13/20]
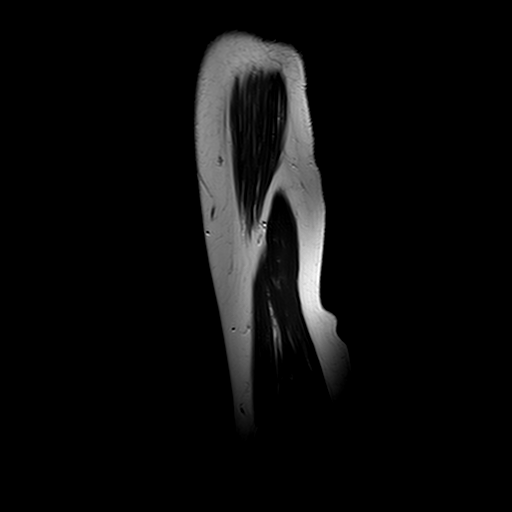
[im 20/20]
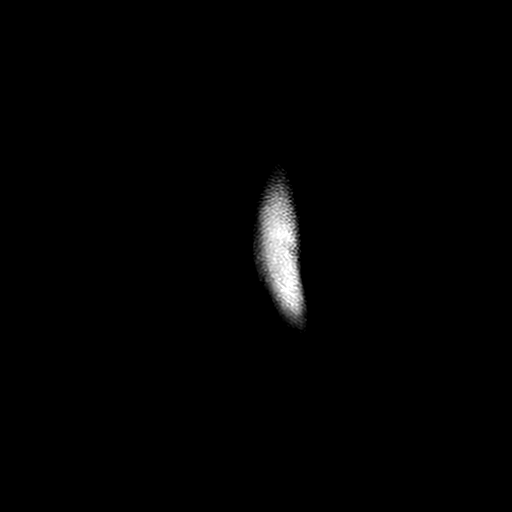

[Series 13: PD fat-sat · coronal · left · 4.0mm · 0.66mm/px · 5 of 24 slices shown]
[im 1/24]
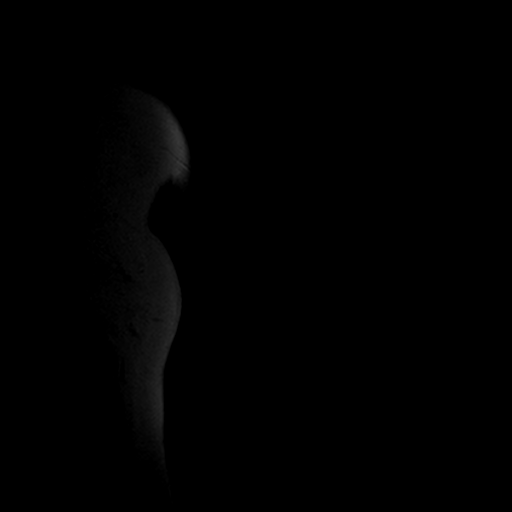
[im 6/24]
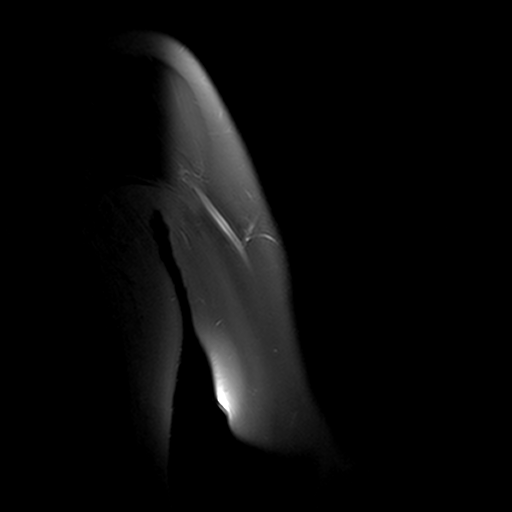
[im 12/24]
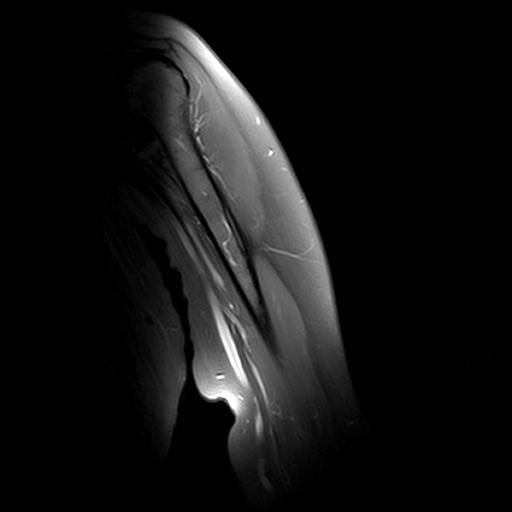
[im 18/24]
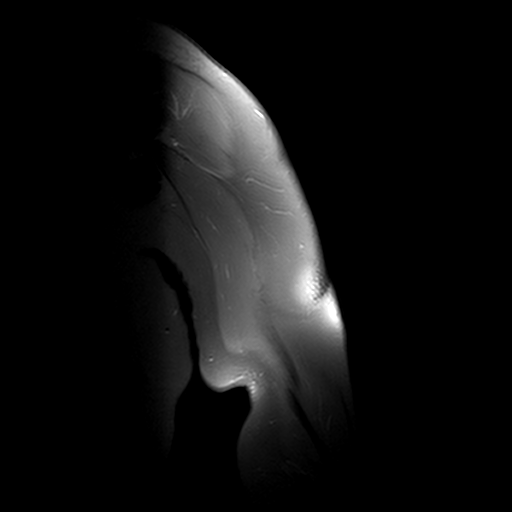
[im 24/24]
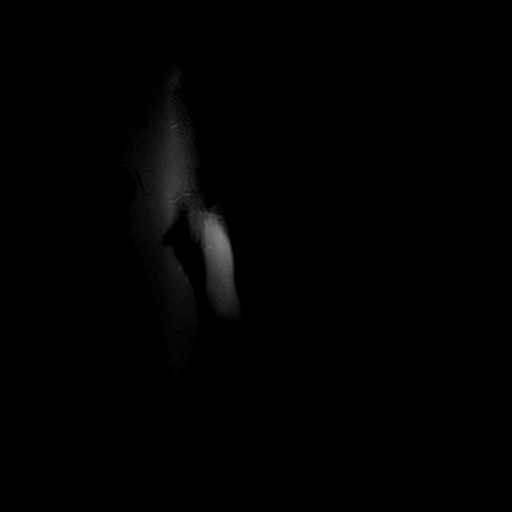

[26 of 40 positions shown; findings below may reference images not displayed]

FINDINGS: No mass or abnormal fluid collection is seen.  In particular, no mass is identified in the region of interest indicated by the lateral skin marker. 

There is no evidence of fracture or significant marrow signal alteration.  No muscle or tendon rupture is seen.
IMPRESSION: 1. Unremarkable examination.  

2. No mass identified.

## 2021-12-02 ENCOUNTER — Other Ambulatory Visit: Payer: 59 | Attending: Obstetrics & Gynecology | Admitting: Obstetrics & Gynecology

## 2021-12-02 DIAGNOSIS — N950 Postmenopausal bleeding: Secondary | ICD-10-CM | POA: Insufficient documentation

## 2021-12-03 ENCOUNTER — Other Ambulatory Visit: Payer: Self-pay

## 2021-12-04 DIAGNOSIS — N950 Postmenopausal bleeding: Secondary | ICD-10-CM

## 2021-12-04 LAB — SURGICAL PATHOLOGY SPECIMEN

## 2022-01-02 ENCOUNTER — Other Ambulatory Visit: Payer: Self-pay

## 2022-01-02 ENCOUNTER — Other Ambulatory Visit: Payer: 59 | Attending: Obstetrics & Gynecology

## 2022-01-02 DIAGNOSIS — Z01818 Encounter for other preprocedural examination: Secondary | ICD-10-CM | POA: Insufficient documentation

## 2022-01-02 LAB — CBC WITH DIFF
BASOPHIL #: 0 10*3/uL (ref 0.00–0.10)
BASOPHIL %: 1 % (ref 0–1)
EOSINOPHIL #: 0.1 10*3/uL (ref 0.00–0.50)
EOSINOPHIL %: 2 %
HCT: 49 % — ABNORMAL HIGH (ref 31.2–41.9)
HGB: 16.4 g/dL — ABNORMAL HIGH (ref 10.9–14.3)
LYMPHOCYTE #: 1 10*3/uL (ref 1.00–3.00)
LYMPHOCYTE %: 20 % (ref 16–44)
MCH: 29.3 pg (ref 24.7–32.8)
MCHC: 33.5 g/dL (ref 32.3–35.6)
MCV: 87.6 fL (ref 75.5–95.3)
MONOCYTE #: 0.4 10*3/uL (ref 0.30–1.00)
MONOCYTE %: 9 % (ref 5–13)
MPV: 9.9 fL (ref 7.9–10.8)
NEUTROPHIL #: 3.4 10*3/uL (ref 1.85–7.80)
NEUTROPHIL %: 69 % (ref 43–77)
PLATELETS: 144 10*3/uL (ref 140–440)
RBC: 5.59 10*6/uL — ABNORMAL HIGH (ref 3.63–4.92)
RDW: 15.6 % (ref 12.3–17.7)
WBC: 4.9 10*3/uL (ref 3.8–11.8)

## 2022-01-02 LAB — THYROXINE, FREE (FREE T4): THYROXINE (T4), FREE: 1.28 ng/dL (ref 0.58–1.64)

## 2022-01-02 LAB — BASIC METABOLIC PANEL
ANION GAP: 6 mmol/L (ref 4–13)
BUN/CREA RATIO: 14 (ref 6–22)
BUN: 11 mg/dL (ref 7–25)
CALCIUM: 10.1 mg/dL (ref 8.6–10.3)
CHLORIDE: 105 mmol/L (ref 98–107)
CO2 TOTAL: 30 mmol/L (ref 21–31)
CREATININE: 0.79 mg/dL (ref 0.60–1.30)
ESTIMATED GFR: 90 mL/min/{1.73_m2} (ref >59)
GLUCOSE: 88 mg/dL (ref 74–109)
OSMOLALITY, CALCULATED: 280 mOsm/kg (ref 270–290)
POTASSIUM: 3.7 mmol/L (ref 3.5–5.1)
SODIUM: 141 mmol/L (ref 136–145)

## 2022-01-02 LAB — THYROID STIMULATING HORMONE (SENSITIVE TSH): TSH: 0.05 u[IU]/mL — ABNORMAL LOW (ref 0.450–5.330)

## 2022-01-04 NOTE — H&P (Signed)
[facility]  Gynecology History and Physical    CC:  postmenopausal bleeding    HPI-   Taylor Smith is a 51 y.o. female admitted for LAVHBSO due to postmenopausal bleeding post ablation.      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)            Current Outpatient Medications   Medication Sig    cloNIDine HCL (CATAPRES) 0.1 mg Oral Tablet Take 1 Tablet (0.1 mg total) by mouth Once a day    ergocalciferol, vitamin D2, (DRISDOL) 1,250 mcg (50,000 unit) Oral Capsule Take 1 Capsule (50,000 Units total) by mouth Every 7 days    levothyroxine (SYNTHROID) 150 mcg Oral Tablet Take 1 Tablet (150 mcg total) by mouth Once a day for 90 days    lisinopriL (PRINIVIL) 40 mg Oral Tablet TAKE 1 TABLET BY MOUTH EVERY DAY    omeprazole (PRILOSEC) 40 mg Oral Capsule, Delayed Release(E.C.) Take 1 Capsule (40 mg total) by mouth Once a day       Past Surgical History:   Procedure Laterality Date    HX CESAREAN SECTION      HX CHOLECYSTECTOMY      HX CYST REMOVAL Right            No Known Allergies    Social History     Socioeconomic History    Marital status: Married     Spouse name: Not on file    Number of children: Not on file    Years of education: Not on file    Highest education level: Not on file   Occupational History    Not on file   Tobacco Use    Smoking status: Never    Smokeless tobacco: Never   Vaping Use    Vaping Use: Never used   Substance and Sexual Activity    Alcohol use: Never    Drug use: Never    Sexual activity: Not on file   Other Topics Concern    Not on file   Social History Narrative    Not on file     Social Determinants of Health     Financial Resource Strain: Not on file   Transportation Needs: Not on file   Social Connections: Not on file   Intimate Partner Violence: Not on file   Housing Stability: Not on file       Family Medical  History:    None             Current Medications-  Current Outpatient Medications   Medication Sig    cloNIDine HCL (CATAPRES) 0.1 mg Oral Tablet Take 1 Tablet (0.1 mg total) by mouth Once a day    ergocalciferol, vitamin D2, (DRISDOL) 1,250 mcg (50,000 unit) Oral Capsule Take 1 Capsule (50,000 Units total) by mouth Every 7 days    levothyroxine (SYNTHROID) 150 mcg Oral Tablet Take 1 Tablet (150 mcg total) by mouth Once a day for 90 days    lisinopriL (PRINIVIL) 40 mg Oral Tablet TAKE 1 TABLET BY MOUTH EVERY DAY    omeprazole (PRILOSEC) 40 mg Oral Capsule, Delayed Release(E.C.) Take 1 Capsule (40 mg total) by mouth Once a day       Allergies-  Patient has no known allergies.      Review  of Systems -   Constitutional: Negative  Skin: Negative  HEENT: Negative  Cardiovascular: Negative  Respiratory: Negative  Gastrointestinal: Negative  Genitourinary: bleeding  Musculoskeletal: Negative  Neurological: Negative  Psychiatric: Negative      Objective-  There were no vitals filed for this visit.        No results found for this or any previous visit (from the past 24 hour(s)).      Physical Exam-    Gen NAD  HEENT Normocephalic  Respiratory no respiratory distress  Abdomen soft, nt  Extremities no c/c/e  Neurological intact  Psychiatric appropriate mood  Pelvic exam: normal size uterus      Assessment & Plan-    Taylor Smith is a 51 y.o. 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

## 2022-01-06 ENCOUNTER — Observation Stay (HOSPITAL_COMMUNITY): Payer: 59 | Admitting: Obstetrics & Gynecology

## 2022-01-06 ENCOUNTER — Other Ambulatory Visit: Payer: Self-pay

## 2022-01-06 ENCOUNTER — Encounter (HOSPITAL_COMMUNITY): Payer: Self-pay | Admitting: Obstetrics & Gynecology

## 2022-01-06 ENCOUNTER — Ambulatory Visit (HOSPITAL_COMMUNITY): Payer: 59 | Admitting: Certified Registered"

## 2022-01-06 ENCOUNTER — Observation Stay
Admission: RE | Admit: 2022-01-06 | Discharge: 2022-01-07 | Disposition: A | Discharge status: Home / Self Care | Payer: 59 | Source: Ambulatory Visit | Attending: Obstetrics & Gynecology | Admitting: Obstetrics & Gynecology

## 2022-01-06 ENCOUNTER — Encounter (HOSPITAL_COMMUNITY)
Admission: RE | Disposition: A | Discharge status: Home / Self Care | Payer: Self-pay | Source: Ambulatory Visit | Attending: Obstetrics & Gynecology

## 2022-01-06 DIAGNOSIS — N950 Postmenopausal bleeding: Principal | ICD-10-CM | POA: Insufficient documentation

## 2022-01-06 DIAGNOSIS — N842 Polyp of vagina: Secondary | ICD-10-CM | POA: Insufficient documentation

## 2022-01-06 DIAGNOSIS — G8918 Other acute postprocedural pain: Secondary | ICD-10-CM | POA: Insufficient documentation

## 2022-01-06 DIAGNOSIS — Z90710 Acquired absence of both cervix and uterus: Secondary | ICD-10-CM | POA: Diagnosis present

## 2022-01-06 DIAGNOSIS — N888 Other specified noninflammatory disorders of cervix uteri: Secondary | ICD-10-CM | POA: Insufficient documentation

## 2022-01-06 SURGERY — HYSTERECTOMY  VAGINAL LAPAROSCOPIC ASSISTED
Anesthesia: General | Site: Vagina | Wound class: Clean Wound: Uninfected operative wounds in which no inflammation occurred

## 2022-01-06 MED ORDER — LACTATED RINGERS INTRAVENOUS SOLUTION
INTRAVENOUS | Status: DC
Start: 2022-01-06 — End: 2022-01-06

## 2022-01-06 MED ORDER — HYDROCODONE 5 MG-ACETAMINOPHEN 325 MG TABLET
1.0000 | ORAL_TABLET | ORAL | Status: DC | PRN
Start: 2022-01-06 — End: 2022-01-07
  Administered 2022-01-07: 1 via ORAL
  Filled 2022-01-06: qty 1

## 2022-01-06 MED ORDER — ONDANSETRON HCL (PF) 4 MG/2 ML INJECTION SOLUTION
INTRAMUSCULAR | Status: AC
Start: 2022-01-06 — End: 2022-01-06
  Filled 2022-01-06: qty 2

## 2022-01-06 MED ORDER — PROCHLORPERAZINE EDISYLATE 10 MG/2 ML (5 MG/ML) INJECTION SOLUTION
5.0000 mg | Freq: Once | INTRAMUSCULAR | Status: DC | PRN
Start: 2022-01-06 — End: 2022-01-06

## 2022-01-06 MED ORDER — FAMOTIDINE (PF) 20 MG/2 ML INTRAVENOUS SOLUTION
20.0000 mg | Freq: Once | INTRAVENOUS | Status: AC
Start: 2022-01-06 — End: 2022-01-06
  Administered 2022-01-06: 20 mg via INTRAVENOUS

## 2022-01-06 MED ORDER — LIDOCAINE (PF) 100 MG/5 ML (2 %) INTRAVENOUS SYRINGE
INJECTION | Freq: Once | INTRAVENOUS | Status: DC | PRN
Start: 2022-01-06 — End: 2022-01-06
  Administered 2022-01-06: 100 mg via INTRAVENOUS

## 2022-01-06 MED ORDER — FENTANYL (PF) 50 MCG/ML INJECTION WRAPPER
25.0000 ug | INJECTION | INTRAMUSCULAR | Status: DC | PRN
Start: 2022-01-06 — End: 2022-01-06

## 2022-01-06 MED ORDER — SODIUM CHLORIDE 0.9 % (FLUSH) INJECTION SYRINGE
3.0000 mL | INJECTION | Freq: Three times a day (TID) | INTRAMUSCULAR | Status: DC
Start: 2022-01-06 — End: 2022-01-07
  Administered 2022-01-06 – 2022-01-07 (×2): 0 mL

## 2022-01-06 MED ORDER — SODIUM CHLORIDE 0.9 % (FLUSH) INJECTION SYRINGE
3.0000 mL | INJECTION | INTRAMUSCULAR | Status: DC | PRN
Start: 2022-01-06 — End: 2022-01-07

## 2022-01-06 MED ORDER — DOCUSATE SODIUM 100 MG CAPSULE
100.0000 mg | ORAL_CAPSULE | Freq: Two times a day (BID) | ORAL | Status: DC | PRN
Start: 2022-01-06 — End: 2022-01-07

## 2022-01-06 MED ORDER — HYDROMORPHONE 2 MG/ML INJECTION WRAPPER
0.2000 mg | INJECTION | INTRAMUSCULAR | Status: DC | PRN
Start: 2022-01-06 — End: 2022-01-07

## 2022-01-06 MED ORDER — LACTATED RINGERS INTRAVENOUS SOLUTION
INTRAVENOUS | Status: DC
Start: 2022-01-06 — End: 2022-01-06
  Administered 2022-01-06: 400 mL via INTRAVENOUS

## 2022-01-06 MED ORDER — ONDANSETRON HCL (PF) 4 MG/2 ML INJECTION SOLUTION
Freq: Once | INTRAMUSCULAR | Status: DC | PRN
Start: 2022-01-06 — End: 2022-01-06
  Administered 2022-01-06: 4 mg via INTRAVENOUS

## 2022-01-06 MED ORDER — SODIUM CHLORIDE 0.9 % (FLUSH) INJECTION SYRINGE
3.0000 mL | INJECTION | INTRAMUSCULAR | Status: DC | PRN
Start: 2022-01-06 — End: 2022-01-06

## 2022-01-06 MED ORDER — MORPHINE 2 MG/ML INJECTION WRAPPER
2.0000 mg | INJECTION | INTRAMUSCULAR | Status: DC | PRN
Start: 2022-01-06 — End: 2022-01-07

## 2022-01-06 MED ORDER — HYDROMORPHONE 2 MG/ML INJECTION WRAPPER
INJECTION | Freq: Once | INTRAMUSCULAR | Status: DC | PRN
Start: 2022-01-06 — End: 2022-01-06
  Administered 2022-01-06 (×2): 1 mg via INTRAVENOUS

## 2022-01-06 MED ORDER — SUGAMMADEX 100 MG/ML INTRAVENOUS SOLUTION
Freq: Once | INTRAVENOUS | Status: DC | PRN
Start: 2022-01-06 — End: 2022-01-06
  Administered 2022-01-06: 300 mg via INTRAVENOUS

## 2022-01-06 MED ORDER — SODIUM CHLORIDE 0.9 % INTRAVENOUS PIGGYBACK
INJECTION | INTRAVENOUS | Status: AC
Start: 2022-01-06 — End: 2022-01-06
  Filled 2022-01-06: qty 100

## 2022-01-06 MED ORDER — BUPRENORPHINE HCL 0.3 MG/ML INJECTION SOLUTION
0.3000 mg | Freq: Four times a day (QID) | INTRAMUSCULAR | Status: DC | PRN
Start: 2022-01-06 — End: 2022-01-07

## 2022-01-06 MED ORDER — SIMETHICONE 80 MG CHEWABLE TABLET
80.0000 mg | CHEWABLE_TABLET | Freq: Four times a day (QID) | ORAL | Status: DC | PRN
Start: 2022-01-06 — End: 2022-01-07
  Administered 2022-01-06: 80 mg via ORAL
  Filled 2022-01-06: qty 1

## 2022-01-06 MED ORDER — LACTATED RINGERS INTRAVENOUS SOLUTION
INTRAVENOUS | Status: AC
Start: 2022-01-06 — End: ?

## 2022-01-06 MED ORDER — FAMOTIDINE (PF) 20 MG/2 ML INTRAVENOUS SOLUTION
INTRAVENOUS | Status: AC
Start: 2022-01-06 — End: 2022-01-06
  Filled 2022-01-06: qty 2

## 2022-01-06 MED ORDER — CEFAZOLIN 1 GRAM SOLUTION FOR INJECTION
INTRAMUSCULAR | Status: AC
Start: 2022-01-06 — End: 2022-01-06
  Filled 2022-01-06: qty 20

## 2022-01-06 MED ORDER — SODIUM CHLORIDE 0.9 % (FLUSH) INJECTION SYRINGE
3.0000 mL | INJECTION | Freq: Three times a day (TID) | INTRAMUSCULAR | Status: DC
Start: 2022-01-06 — End: 2022-01-06

## 2022-01-06 MED ORDER — MIDAZOLAM 5 MG/ML INJECTION WRAPPER
2.0000 mg | Freq: Once | INTRAMUSCULAR | Status: DC | PRN
Start: 2022-01-06 — End: 2022-01-06
  Administered 2022-01-06: 2 mg via INTRAVENOUS

## 2022-01-06 MED ORDER — ONDANSETRON HCL (PF) 4 MG/2 ML INJECTION SOLUTION
4.0000 mg | Freq: Once | INTRAMUSCULAR | Status: AC
Start: 2022-01-06 — End: 2022-01-06
  Administered 2022-01-06: 4 mg via INTRAVENOUS

## 2022-01-06 MED ORDER — FENTANYL (PF) 50 MCG/ML INJECTION WRAPPER
50.0000 ug | INJECTION | INTRAMUSCULAR | Status: DC | PRN
Start: 2022-01-06 — End: 2022-01-06

## 2022-01-06 MED ORDER — ROCURONIUM 10 MG/ML INTRAVENOUS SOLUTION
Freq: Once | INTRAVENOUS | Status: DC | PRN
Start: 2022-01-06 — End: 2022-01-06
  Administered 2022-01-06: 10 mg via INTRAVENOUS
  Administered 2022-01-06: 50 mg via INTRAVENOUS

## 2022-01-06 MED ORDER — ONDANSETRON HCL (PF) 4 MG/2 ML INJECTION SOLUTION
4.0000 mg | Freq: Four times a day (QID) | INTRAMUSCULAR | Status: DC | PRN
Start: 2022-01-06 — End: 2022-01-07
  Administered 2022-01-06: 4 mg via INTRAVENOUS
  Filled 2022-01-06: qty 2

## 2022-01-06 MED ORDER — ETHYL ALCOHOL 62 % (NOZIN NASAL SANITIZER) NASAL SOLUTION - BULK BOTTLE
1.0000 | Freq: Two times a day (BID) | NASAL | Status: DC
Start: 2022-01-06 — End: 2022-01-07
  Administered 2022-01-06: 1 via NASAL

## 2022-01-06 MED ORDER — DEXAMETHASONE SODIUM PHOSPHATE 4 MG/ML INJECTION SOLUTION
Freq: Once | INTRAMUSCULAR | Status: DC | PRN
Start: 2022-01-06 — End: 2022-01-06
  Administered 2022-01-06: 8 mg via INTRAVENOUS

## 2022-01-06 MED ORDER — ONDANSETRON HCL (PF) 4 MG/2 ML INJECTION SOLUTION
4.0000 mg | Freq: Once | INTRAMUSCULAR | Status: DC | PRN
Start: 2022-01-06 — End: 2022-01-06

## 2022-01-06 MED ORDER — DEXAMETHASONE SODIUM PHOSPHATE 4 MG/ML INJECTION SOLUTION
4.0000 mg | Freq: Once | INTRAMUSCULAR | Status: AC
Start: 2022-01-06 — End: 2022-01-06
  Administered 2022-01-06: 4 mg via INTRAVENOUS

## 2022-01-06 MED ORDER — OXYCODONE-ACETAMINOPHEN 5 MG-325 MG TABLET
2.0000 | ORAL_TABLET | ORAL | Status: DC | PRN
Start: 2022-01-06 — End: 2022-01-07
  Administered 2022-01-06 – 2022-01-07 (×3): 2 via ORAL
  Filled 2022-01-06 (×3): qty 2

## 2022-01-06 MED ORDER — MIDAZOLAM 5 MG/ML INJECTION WRAPPER
INTRAMUSCULAR | Status: AC
Start: 2022-01-06 — End: 2022-01-06
  Filled 2022-01-06: qty 1

## 2022-01-06 MED ORDER — ALBUTEROL SULFATE 2.5 MG/3 ML (0.083 %) SOLUTION FOR NEBULIZATION
2.5000 mg | INHALATION_SOLUTION | Freq: Once | RESPIRATORY_TRACT | Status: DC | PRN
Start: 2022-01-06 — End: 2022-01-06

## 2022-01-06 MED ORDER — PROPOFOL 10 MG/ML IV BOLUS
INJECTION | Freq: Once | INTRAVENOUS | Status: DC | PRN
Start: 2022-01-06 — End: 2022-01-06
  Administered 2022-01-06: 150 mg via INTRAVENOUS

## 2022-01-06 MED ORDER — IPRATROPIUM 0.5 MG-ALBUTEROL 3 MG (2.5 MG BASE)/3 ML NEBULIZATION SOLN
3.0000 mL | INHALATION_SOLUTION | Freq: Once | RESPIRATORY_TRACT | Status: DC | PRN
Start: 2022-01-06 — End: 2022-01-06

## 2022-01-06 MED ORDER — DEXAMETHASONE SODIUM PHOSPHATE 4 MG/ML INJECTION SOLUTION
INTRAMUSCULAR | Status: AC
Start: 2022-01-06 — End: 2022-01-06
  Filled 2022-01-06: qty 1

## 2022-01-06 MED ORDER — SODIUM CHLORIDE 0.9 % INTRAVENOUS PIGGYBACK
2.0000 g | INJECTION | Freq: Once | INTRAVENOUS | Status: AC
Start: 2022-01-06 — End: 2022-01-06
  Administered 2022-01-06: 2 g via INTRAVENOUS

## 2022-01-06 MED ORDER — EPHEDRINE SULFATE 50 MG/ML INTRAVENOUS SOLUTION
Freq: Once | INTRAVENOUS | Status: DC | PRN
Start: 2022-01-06 — End: 2022-01-06
  Administered 2022-01-06: 10 mg via INTRAVENOUS

## 2022-01-06 MED ORDER — IBUPROFEN 600 MG TABLET
600.0000 mg | ORAL_TABLET | Freq: Four times a day (QID) | ORAL | Status: DC | PRN
Start: 2022-01-06 — End: 2022-01-07
  Administered 2022-01-06 – 2022-01-07 (×3): 600 mg via ORAL
  Filled 2022-01-06 (×3): qty 1

## 2022-01-06 SURGICAL SUPPLY — 87 items
ADH LIQUID LF  WTPRF VIAL PREP NONSTAIN MASTISOL STYRAX GUM MASTIC ALC MTHY SLCYT STRL CLR NHZR 2/3 (WOUND CARE SUPPLY) ×1 IMPLANT
ADH LQ LF VIAL AMP PREP MASTI_SOL STYRAX GUM MASTIC ALC MTHY (WOUND CARE/ENTEROSTOMAL SUPPLY) ×1
BAG DRAIN 2000ML ANTIREFLUX TWR SLIDE TAP PORT BLUNT CANN LF (UROLOGICAL SUPPLIES) ×1 IMPLANT
BLADE 10 2 END CBNSTL SURG STRL DISP (SURGICAL CUTTING SUPPLIES) ×2 IMPLANT
BLADE 11 2 END CBNSTL SURG STRL DISP (SURGICAL CUTTING SUPPLIES) ×1 IMPLANT
BLADE SURG CLPR PVT ADJ HEAD 9661 STRL LF  DISP PURP (MED SURG SUPPLIES) IMPLANT
BLADE SURG CLPR PVT ADJ HEAD 9661 STRL LF DISP PURP (MED SURG SUPPLIES)
CATH URETH DOVER 16FR FOLEY 2W LRG SMOOTH DRAIN EYE FIRM TIP SIL 10ML STRL LF  BLU STRP CLR (UROLOGICAL SUPPLIES) ×1 IMPLANT
CATH URETH DOVER 16FR FOLEY 2W_LRG SMOOTH DRAIN EYE FIRM TIP (UROLOGICAL SUPPLIES) ×1
CLOSURE SKIN STRIPS 1/2X4IN_R1547 6/PK 50PK/BX (WOUND CARE/ENTEROSTOMAL SUPPLY) ×1
CONV USE 31829 - NEEDLE HYPO  18GA 1.5IN STD REG BVL LF (MED SURG SUPPLIES) ×1 IMPLANT
CONV USE 405155 - PACK SURG AURR LAPSCP PLVSCP DRP REINF TBL CVR STRL 90X50IN 53X24IN LF (CUSTOM TRAYS & PACK) ×1 IMPLANT
CONV USE ITEM 321837 - GLOVE SURG 7.5 LTX PF NONST CRM (GLOVES AND ACCESSORIES) IMPLANT
CONV USE ITEM 321850 - GLOVE SURG 8.5 LF  BEAD CUF SMOOTH HI GRIP WHT 12IN MDCHC PLISPRN (GLOVES AND ACCESSORIES) IMPLANT
CONV USE ITEM 321852 - GLOVE SURG 6.5 LF  PLISPRN (GLOVES AND ACCESSORIES) IMPLANT
CONV USE ITEM 321854 - GLOVE SURG 6 LF  BEAD CUF SMOOTH HI GRIP WHT 12IN MDCHC PLISPRN (GLOVES AND ACCESSORIES) ×2 IMPLANT
CONV USE ITEM 321863 - GLOVE SURG 6.5 LF PF SMOOTH STRL GRN  PLISPRN MICRO (GLOVES AND ACCESSORIES) IMPLANT
CONV USE ITEM 321982 - GLOVE SURG 7 LTX CHEMO PF SMOOTH BEAD CUF STRL WHT 11.6IN PLMR THK.2MM THK.21MM (GLOVES AND ACCESSORIES) IMPLANT
CONV USE ITEM 34153 - ELECTRODE ESURG BLADE PNCL 3/32IN STRL SS CAUT PSHBTN STD SHAFT LF  VEGA SER (SURGICAL CUTTING SUPPLIES) IMPLANT
COVER 53X24IN MAYOSTAND PRXM STRL DISP EQP SMS LF (DRAPE/PACKS/SHEETS/OR TOWEL) ×1 IMPLANT
DISCONTINUED USE 162518 - PAD SANITARY CURITY HVY ABS MOIST BARRIER NWVN CVR MATERNITY SILK FLF DISP LF  NONST 12.25X4.25IN (MED SURG SUPPLIES) ×1 IMPLANT
DRAPE MAYOSTAND CVR 53X24IN PR_XM LF STRL DISP EQP SMS (DRAPE/PACKS/SHEETS/OR TOWEL) ×1
ELECTRODE ESURG BLADE PNCL 3/32IN STRL SS CAUT PSHBTN STD (CUTTING ELEMENTS)
GLOVE SURG 6 LF  PF SMOOTH BEAD CUF STRL GRN 12IN SENSICARE PI PLISPRN PLMR ALOE THK7.9 MIL DISP (GLOVES AND ACCESSORIES) IMPLANT
GLOVE SURG 6 LF PF SMOOTH BEAD CUF STRL GRN 12IN SENSICARE (GLOVES AND ACCESSORIES)
GLOVE SURG 6 LF PF SMOOTH STRL WHT PLISPRN (GLOVES AND ACCESSORIES) ×2
GLOVE SURG 6.5 LF  PF SMOOTH STRL GRN  PLISPRN MICRO (GLOVES AND ACCESSORIES)
GLOVE SURG 6.5 LF PF SMOOTH STRL WHT PLISPRN (GLOVES AND ACCESSORIES)
GLOVE SURG 6.5 LTX PF BEAD CUF MICRO ROUGHEN N-PYRG STRL STRW BGL SRG CURVE FINGER (GLOVES AND ACCESSORIES) IMPLANT
GLOVE SURG 6.5 LTX PF BEAD CUF_MICRO RGH N-PYRG STRL STRW (GLOVES AND ACCESSORIES)
GLOVE SURG 7 LF  PF SMOOTH TXTR BEAD CUF STRL GRN 12IN SENSICARE PI PLISPRN SYN PLMR ALOE THK7.9 MIL (GLOVES AND ACCESSORIES) IMPLANT
GLOVE SURG 7 LF  PF STRL PLISPRN DISP (GLOVES AND ACCESSORIES) ×2 IMPLANT
GLOVE SURG 7 LF PF SMOOTH STRL WHT PLISPRN (GLOVES AND ACCESSORIES) ×2
GLOVE SURG 7 LF PF SMOOTH TXTR BEAD CUF STRL GRN 12IN (GLOVES AND ACCESSORIES)
GLOVE SURG 7 LTX PF SMOOTH STRL CRM (GLOVES AND ACCESSORIES)
GLOVE SURG 7.5 LF  PF SMOOTH BEAD CUF STRL GRN 12IN SENSICARE PI GRN PLISPRN PLMR ALOE THK7.9 MIL (GLOVES AND ACCESSORIES) IMPLANT
GLOVE SURG 7.5 LF PF SMOOTH BEAD CUF STRL GRN 12IN (GLOVES AND ACCESSORIES)
GLOVE SURG 7.5 LF PF SMOOTH STRL WHT PLISPRN (GLOVES AND ACCESSORIES) ×2
GLOVE SURG 7.5 LTX PF SMOOTH STRL CRM (GLOVES AND ACCESSORIES)
GLOVE SURG 8 LF  BEAD CUF DERMASHIELD PLISPRN (GLOVES AND ACCESSORIES) IMPLANT
GLOVE SURG 8 LF  PF BEAD CUF SMOOTH TXTR STRL GRN 12IN SENSICARE PLISPRN SYN PLMR ALOE THK7.9 MIL (GLOVES AND ACCESSORIES) IMPLANT
GLOVE SURG 8 LF PF BEAD CUF SMOOTH TXTR STRL GRN 12IN (GLOVES AND ACCESSORIES)
GLOVE SURG 8 LF PF SMOOTH STRL WHT PLISPRN (GLOVES AND ACCESSORIES)
GLOVE SURG 8.5 LF PF SMOOTH STRL WHT PLISPRN (GLOVES AND ACCESSORIES)
GLOVE SURG LF  PF STRL 7.5 PLISPRN DISP (GLOVES AND ACCESSORIES) ×2 IMPLANT
GOWN SURG 2XL XLNG LGTH L3 HKLP CLSR RGLN SLEEVE TWL STRL LF (DRAPE/PACKS/SHEETS/OR TOWEL) ×1
GOWN SURG 2XL XLNG LGTH L3 HKLP CLSR RGLN SLEEVE TWL STRL LF  DISP GRN AERO BLU PRFRM FBRC (DRAPE/PACKS/SHEETS/OR TOWEL) ×1 IMPLANT
GOWN SURG XL STD LGTH L3 HKLP CLSR RGLN SLEEVE TWL STRL LF (DRAPE/PACKS/SHEETS/OR TOWEL) ×2
GOWN SURG XL STD LGTH L3 HKLP CLSR RGLN SLEEVE TWL STRL LF  DISP GRN AERO BLU PRFRM FBRC (DRAPE/PACKS/SHEETS/OR TOWEL) ×2 IMPLANT
INTROD SPINAL YW 1.25IN 20GA QUINCKE 25GA 27GA NEEDLE STRL (MED SURG SUPPLIES) ×1
INTROD SPINAL YW 1.25IN 20GA QUINCKE 25GA 27GA NEEDLE STRL LF  DISP (MED SURG SUPPLIES) ×1 IMPLANT
IRR SUCT 10FT STRKFL TUBE 2 SPIKE STRL LF  DISP (ENDOSCOPIC SUPPLIES) IMPLANT
IRR SUCT 10FT STRKFL TUBE 2 SPIKE STRL LF DISP (INSTRUMENTS ENDOMECHANICAL)
NEEDLE HYPO 18GA 1.5IN STD RE_G BVL LF (MED SURG SUPPLIES) ×1
NEEDLE INSFL 120MM 14GA STD SRGNDL PNMPRTN SPRG LD BLUNT STY STRL LF  DISP SS PLASTIC RD (ENDOSCOPIC SUPPLIES) ×1 IMPLANT
NEEDLE INSFL 120MM 14GA STD SR_GNDL PNMPRTN SPRG LD BLUNT STY (INSTRUMENTS ENDOMECHANICAL) ×1
PACK SURG AURR LAPSCP PLVSCP DRP REINF TBL CVR STRL 90X50IN (CUSTOM TRAYS & PACK) ×1
PACK SURG AURR LAPSCP PLVSCP DRP REINF TBL CVR STRL 90X50IN 53X24IN LF (CUSTOM TRAYS & PACK) ×1
PAD OB BULK 2022A_14EA/PK 12PK/CS (MED SURG SUPPLIES) ×1
PAD SANITARY CURITY HVY ABS MOIST BARRIER NWVN CVR MATERNITY SILK FLF DISP LF  NONST 12.25X4.25IN (MED SURG SUPPLIES) ×1
SCISSOR LAPSCP 2 EDGE BLADE ROT DIAL ERG HNDL LOW PROF EPIX (INSTRUMENTS ENDOMECHANICAL) ×1
SCISSOR LAPSCP 2 EDGE BLADE ROT DIAL ERG HNDL LOW PROF EPIX D DR SS 35CM LF  DISP 5MM (ENDOSCOPIC SUPPLIES) ×1 IMPLANT
SET TUBING PNEUMOCLEAR HIFLO SMOKE EVAC (MED SURG SUPPLIES) ×1 IMPLANT
SET TUBING PNEUMOCLEAR HIFLO S_MOKE EVAC DEMOTE (MED SURG SUPPLIES) ×1
SLEEVE COMPRESS LRG KNEE LGTH KENDALL SCD NONST LF  DISP 26- IN (MED SURG SUPPLIES) IMPLANT
SLEEVE COMPRESS LRG KNEE LGTH KENDALL SCD NONST LF DISP 26- (MED SURG SUPPLIES)
SLEEVE COMPRESS MED KNEE LGTH KENDALL SCD SEQ NONST LF  DISP 21- IN DVT PE (MED SURG SUPPLIES) ×1 IMPLANT
SLEEVE COMPRESS MED KNEE LGTH KENDALL SCD SEQ NONST LF DISP (MED SURG SUPPLIES) ×1
SLEEVE LAPSCP 5MM ACCESS SYS 100MM KII ABDOMINAL Z THREAD (INSTRUMENTS ENDOMECHANICAL) ×2
SLEEVE LAPSCP 5MM ACCESS SYS 100MM KII ABDOMINAL Z THREAD CANN SEAL LOW PROF STRL LF (ENDOSCOPIC SUPPLIES) ×2 IMPLANT
SOL ANFG DFGR ISOPRPNL PAD OVAL BTL NABRSV ADH STRL LF  DISP (ENDOSCOPIC SUPPLIES) ×1 IMPLANT
SOLUTION ANTI FOG W/SPONGE_280101 DEFOG ENDOMATE 20EA/CS (INSTRUMENTS ENDOMECHANICAL) ×1
SPONGE SURG 4X4IN 16 PLY XRY DETECT COTTON STRL LF  DISP (WOUND CARE SUPPLY) ×1 IMPLANT
SPONGE SURG 4X4IN 16 PLY_RADOPQ COT STRL LF DISP (WOUND CARE/ENTEROSTOMAL SUPPLY) ×1
STRIP 4X.5IN STRSTRP PLSTR REINF SKNCLS WHT STRL LF (WOUND CARE SUPPLY) ×1 IMPLANT
SUTURE 0 GS-21 POLYSRB 36IN VIOL BRD COAT ABS (SUTURE/WOUND CLOSURE) ×1 IMPLANT
SUTURE 0 GS-21 POLYSRB D-TACH 18IN VIOL BRD 5 STRN COAT ABS (SUTURE/WOUND CLOSURE) ×4 IMPLANT
SUTURE 3-0 C-13 POLYSRB 30IN UNDYED BRD COAT ABS (SUTURE/WOUND CLOSURE) ×1 IMPLANT
SYRINGE LL 10ML LF  STRL GRAD N-PYRG DEHP-FR PVC FREE MED DISP (MED SURG SUPPLIES) ×4 IMPLANT
SYRINGE LL 10ML LF STRL MED D_ISP (MED SURG SUPPLIES) ×4
TOWEL 24X16IN COTTON BLU DISP SURG STRL LF (DRAPE/PACKS/SHEETS/OR TOWEL) ×2 IMPLANT
TROCAR LAPSCP 100MM 5MM KII Z THREAD SLEEVE RETENTION DISC (INSTRUMENTS ENDOMECHANICAL) ×1
TROCAR LAPSCP 100MM 5MM KII Z THREAD SLEEVE RETENTION DISC LOW PROF STRL LF  OPTC ACCESS SYS (ENDOSCOPIC SUPPLIES) ×1 IMPLANT
TUBING SUCT CLR 6FT .25IN ARGYLE PVC NCDTV STR MALE FEMALE (MED SURG SUPPLIES) ×1
TUBING SUCT CLR 6FT .25IN ARGYLE PVC NCDTV STR MALE FEMALE MLD CONN STRL LF (MED SURG SUPPLIES) ×1 IMPLANT
WATER STRL 1000ML PLASTIC PR BTL LF (MED SURG SUPPLIES) ×1 IMPLANT
WATER STRL 1000ML PLASTIC PR B_TL LF (MED SURG SUPPLIES) ×1

## 2022-01-06 NOTE — Nurses Notes (Signed)
NO PREGNANCY TEST COMPLETED THIS AM. PATIENT STATES She HAS BEEN IN MENOPAUSE SINCE 2019

## 2022-01-06 NOTE — Brief Op Note (Signed)
[facility]  Operative Note    Patient Name: Taylor Smith, Taylor Smith Number: [id number]  Date of Service: 01/06/2022   Date of Birth: [birth year]      Pre-Operative Diagnosis: POST MENOPAUSAL BLEEDING     Post-Operative Diagnosis: POST MENOPAUSAL BLEEDING, Adhesive disease    Procedure(s)/Description:  LAPAROSCOPIC ASSISTED  VAGINAL HYSTERECTOMY; LEFT SALPINGO OOPHERECTOMY; LYSIS OF ADHESIONS: 58550 (CPT)     Findings: Adhesive disease right side umbilicus to sidewall extensive, no right adnexa, normal uterus and left adnexa. Procedure: Patient to OR post consent, placed under general, prepped/draped usual fashion, foley placed clear urine throughout, tenaculum on cervix, 43m incision infraumbilical, veress introduced, pneumoperitoneum created, 5 mm trocar inserted. Adhesions noted on right side, second 523mtrocar in left flank, adhesive disease taken down with blunt dissection. Round ligament followed by uterine vessels on right with bipolar cautery and transection, left infundibulopelvic and round ligament taken down, care to avoid ureter. Uterine vessels taken down, pneumo removed, procedure from below. Saline infiltrated around cervix, circumferential incision around cervix followed by entering posterior cul de sac, stitch placed, uterosacrals taken down retained with 0 vicryl. Cardinal ligaments taken down, specimen removed sent to pathology, normal appearance. Peritoneum closed, cuff closed 0 vicryl figure eights, pneumo recreated, cuff and pedicals inspected, hemostatic. Pneumo released, 3 incisions closed 3-0 vicryl sterile dressing, patient to recovery stable condition. No complications, fluids [id number],EBL 30064mfoley clear, kefzol 2 gm preop, TED hose /SCDs, uterus/cervix,left adnexa to pathology.    Attending Surgeon: JoeArline AspD     Assistants: allDerinda Sis    Anesthesia:  Anesthesiologist: BerLavena StanfordO  CRNA: GibAntonieta Pert Anesthesia Type: .General     Estimated  Blood Loss:  300 ml    Specimens Removed:   ID Type Source Tests Collected by Time Destination   1 : VAGINAL POLYP Tissue Vaginal SURGICAL PATHOLOGY SPECIMEN EllArline AspD 01/06/2022 1225    2 : UTERUS, CERVIX, LEFT OVARY AND FALLOPIAN TUBE Tissue Uterus with Fallopian Tubes and Ovaries SURGICAL PATHOLOGY SPECIMEN EllArline AspD 01/06/2022 1324       Order Name Source Comment Collection Info Order Time   HCG, URINE QUALITATIVE, PREGNANCY Urine, Site not specified   01/06/2022  7:57 AM     Release to patient   Automated        SURGICAL PATHOLOGY SPECIMEN Vaginal Pre-op diagnosis:  POST MENOPAUSAL BLEEDING Collected By: EllArline AspD 01/06/2022 12:25 PM     Release to patient   Automated             Complications:  None      Condition: stable    Disposition: PACU - hemodynamically stable.    JoeArline AspD

## 2022-01-06 NOTE — Anesthesia Preprocedure Evaluation (Signed)
ANESTHESIA PRE-OP EVALUATION  Planned Procedure: LAPAROSCOPIC ASSISTED  VAGINAL HYSTERECTOMY (Vagina )  Review of Systems     anesthesia history negative     patient summary reviewed  nursing notes reviewed        Pulmonary  negative pulmonary ROS,    Cardiovascular    Hypertension ,No peripheral edema,  Exercise Tolerance: > or = 4 METS        GI/Hepatic/Renal    GERD and well controlled        Endo/Other    hypothyroidism,      Neuro/Psych/MS   negative neuro/psych ROS,      Cancer                      Physical Assessment      Airway       Mallampati: II    TM distance: >3 FB    Neck ROM: full  Mouth Opening: good.            Dental       Dentition intact             Pulmonary    Breath sounds clear to auscultation  (-) no rhonchi, no decreased breath sounds, no wheezes, no rales and no stridor     Cardiovascular    Rhythm: regular  Rate: Normal  (-) no friction rub, carotid bruit is not present, no peripheral edema and no murmur     Other findings            Plan  ASA 2     Planned anesthesia type: general     general anesthesia with endotracheal tube intubation    plan to administer opioids postoperatively            PONV/POV Plan:  I plan to administer pharmcologic prophalaxis antiemetics  Intravenous induction     Anesthesia issues/risks discussed are: PONV, Eye /Visual Loss, Dental Injuries, Nerve Injuries, Blood Loss, Sore Throat, Aspiration, Cardiac Events/MI, Stroke and Post-op Pain Management.  Anesthetic plan and risks discussed with patient  signed consent obtained        Use of blood products discussed with patient who consented to blood products.      Patient's NPO status is appropriate for Anesthesia.           Plan discussed with CRNA.

## 2022-01-06 NOTE — Anesthesia Transfer of Care (Signed)
ANESTHESIA TRANSFER OF CARE   Taylor Smith is a 51 y.o. 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 (CMS [facility])       Allergy History as of 01/06/22        No Known Allergies                  I completed my transfer of care / handoff to the receiving personnel during which we discussed:  Access, Airway, All key/critical aspects of case discussed, Analgesia, Antibiotics, Expectation of post procedure, Fluids/Product and Gave opportunity for questions and acknowledgement of understanding  Report given to: Carrolyn Leigh, RN    Post Location: PACU                                                           Last OR Temp: Temperature: 37.1 C (98.8 F)  ABG:  POTASSIUM   Date Value Ref Range Status   01/02/2022 3.7 3.5 - 5.1 mmol/L Final     KETONES   Date Value Ref Range Status   10/22/2021 Negative Negative mg/dL Final     CALCIUM   Date Value Ref Range Status   01/02/2022 10.1 8.6 - 10.3 mg/dL Final     Airway:* No LDAs found *  Blood pressure 138/76, pulse 71, temperature 37.1 C (98.8 F), resp. rate 16, height 1.753 m ('5\' 9"'$ ), weight 93.4 kg (206 lb), SpO2 94 %.

## 2022-01-06 NOTE — Care Plan (Signed)
Problem: Adult Inpatient Plan of Care  Goal: Plan of Care Review  01/06/2022 2042 by Armando Reichert, RN  Outcome: Ongoing (see interventions/notes)  01/06/2022 2042 by Armando Reichert, RN  Outcome: Ongoing (see interventions/notes)  Goal: Patient-Specific Goal (Individualized)  01/06/2022 2042 by Armando Reichert, RN  Outcome: Ongoing (see interventions/notes)  01/06/2022 2042 by Armando Reichert, RN  Outcome: Ongoing (see interventions/notes)  Goal: Absence of Hospital-Acquired Illness or Injury  01/06/2022 2042 by Armando Reichert, RN  Outcome: Ongoing (see interventions/notes)  01/06/2022 2042 by Armando Reichert, RN  Outcome: Ongoing (see interventions/notes)  Goal: Optimal Comfort and Wellbeing  01/06/2022 2042 by Armando Reichert, RN  Outcome: Ongoing (see interventions/notes)  01/06/2022 2042 by Armando Reichert, RN  Outcome: Ongoing (see interventions/notes)  Goal: Rounds/Family Conference  01/06/2022 2042 by Armando Reichert, RN  Outcome: Ongoing (see interventions/notes)  01/06/2022 2042 by Armando Reichert, RN  Outcome: Ongoing (see interventions/notes)     Problem: Hysterectomy Total or Partial  Goal: Absence of Bleeding  Outcome: Ongoing (see interventions/notes)  Goal: Effective Bowel Elimination  Outcome: Ongoing (see interventions/notes)  Goal: Fluid and Electrolyte Balance  Outcome: Ongoing (see interventions/notes)  Goal: Absence of Infection Signs and Symptoms  Outcome: Ongoing (see interventions/notes)  Goal: Anesthesia/Sedation Recovery  Outcome: Ongoing (see interventions/notes)  Goal: Acceptable Pain Control  Outcome: Ongoing (see interventions/notes)  Goal: Nausea and Vomiting Relief  Outcome: Ongoing (see interventions/notes)  Goal: Effective Urinary Elimination  Outcome: Ongoing (see interventions/notes)  Goal: Effective Oxygenation and Ventilation  Outcome: Ongoing (see interventions/notes)

## 2022-01-06 NOTE — Care Plan (Signed)
Problem: Adult Inpatient Plan of Care  Goal: Plan of Care Review  Outcome: Ongoing (see interventions/notes)  Goal: Patient-Specific Goal (Individualized)  Outcome: Ongoing (see interventions/notes)  Goal: Absence of Hospital-Acquired Illness or Injury  Outcome: Ongoing (see interventions/notes)  Goal: Optimal Comfort and Wellbeing  Outcome: Ongoing (see interventions/notes)  Goal: Rounds/Family Conference  Outcome: Ongoing (see interventions/notes)

## 2022-01-06 NOTE — Anesthesia Postprocedure Evaluation (Signed)
Anesthesia Post Op Evaluation    Patient: Taylor Smith  Procedure(s):  LAPAROSCOPIC ASSISTED  VAGINAL HYSTERECTOMY; LEFT SALPINGO OOPHERECTOMY; LYSIS OF ADHESIONS    Last Vitals:Temperature: 36.4 C (97.6 F) (01/06/22 1334)  Heart Rate: 74 (01/06/22 1348)  BP (Non-Invasive): (!) 144/75 (01/06/22 1348)  Respiratory Rate: (!) 11 (01/06/22 1348)  SpO2: 97 % (01/06/22 1348)    No notable events documented.    Patient is sufficiently recovered from the effects of anesthesia to participate in the evaluation and has returned to their pre-procedure level.  Patient location during evaluation: PACU       Patient participation: complete - patient participated  Level of consciousness: awake and alert and responsive to verbal stimuli    Pain management: adequate  Airway patency: patent    Anesthetic complications: no  Cardiovascular status: acceptable  Respiratory status: acceptable  Hydration status: acceptable  Patient post-procedure temperature: Pt Normothermic   PONV Status: Absent

## 2022-01-07 DIAGNOSIS — N950 Postmenopausal bleeding: Secondary | ICD-10-CM

## 2022-01-07 LAB — CBC WITH DIFF
BASOPHIL #: 0 10*3/uL (ref 0.00–0.10)
BASOPHIL %: 0 % (ref 0–1)
EOSINOPHIL #: 0 10*3/uL (ref 0.00–0.50)
EOSINOPHIL %: 0 % — ABNORMAL LOW
HCT: 46 % — ABNORMAL HIGH (ref 31.2–41.9)
HGB: 15.3 g/dL — ABNORMAL HIGH (ref 10.9–14.3)
LYMPHOCYTE #: 0.6 10*3/uL — ABNORMAL LOW (ref 1.00–3.00)
LYMPHOCYTE %: 5 % — ABNORMAL LOW (ref 16–44)
MCH: 29.2 pg (ref 24.7–32.8)
MCHC: 33.2 g/dL (ref 32.3–35.6)
MCV: 88 fL (ref 75.5–95.3)
MONOCYTE #: 0.6 10*3/uL (ref 0.30–1.00)
MONOCYTE %: 6 % (ref 5–13)
MPV: 9.5 fL (ref 7.9–10.8)
NEUTROPHIL #: 9.9 10*3/uL — ABNORMAL HIGH (ref 1.85–7.80)
NEUTROPHIL %: 89 % — ABNORMAL HIGH (ref 43–77)
PLATELETS: 143 10*3/uL (ref 140–440)
RBC: 5.23 10*6/uL — ABNORMAL HIGH (ref 3.63–4.92)
RDW: 15.5 % (ref 12.3–17.7)
WBC: 11.1 10*3/uL (ref 3.8–11.8)

## 2022-01-07 LAB — LIGHT GREEN TOP TUBE

## 2022-01-07 MED ORDER — IBUPROFEN 600 MG TABLET
600.0000 mg | ORAL_TABLET | Freq: Four times a day (QID) | ORAL | 1 refills | Status: DC | PRN
Start: 2022-01-07 — End: 2023-12-24

## 2022-01-07 MED ORDER — DOCUSATE SODIUM 100 MG CAPSULE
100.0000 mg | ORAL_CAPSULE | Freq: Two times a day (BID) | ORAL | Status: DC | PRN
Start: 2022-01-07 — End: 2022-01-17

## 2022-01-07 MED ORDER — OXYCODONE-ACETAMINOPHEN 5 MG-325 MG TABLET
2.0000 | ORAL_TABLET | ORAL | 0 refills | Status: DC | PRN
Start: 2022-01-07 — End: 2022-01-16

## 2022-01-07 NOTE — Progress Notes (Signed)
[facility]  IP PROGRESS NOTE      Nayvie, Lips  Date of Admission:  01/06/2022  Date of Birth:  10-24-1970  Date of Service:  01/07/2022    Hospital Day:  LOS: 0 days     Chief Complaint: incisiona pain umbilicus  Subjective:   Taylor Smith is a 51 y.o. female G3P3 POD#1 s/p LAVH LSO LOA doing well, voiding, ambulating, tolerating diet, adequate pain control on po meds, desires D/C home.    Vital Signs:  Temp (24hrs) Max:36.8 C (98.2 F)      Temperature: 36.7 C (98 F)  BP (Non-Invasive): (!) 155/72  MAP (Non-Invasive): 104 mmHG  Heart Rate: 58  Respiratory Rate: 16  SpO2: 95 %    Current Medications:  alcohol 62 % (NOZIN NASAL SANITIZER) nasal solution, 1 Each, Each Nostril, 2x/day  buprenorphine (BUPRENEX) 0.3 mg/mL injection, 0.3 mg, Intravenous, Q6H PRN  docusate sodium (COLACE) capsule, 100 mg, Oral, 2x/day PRN  HYDROcodone-acetaminophen (NORCO) 5-325 mg per tablet, 1 Tablet, Oral, Q4H PRN  HYDROmorphone (DILAUDID) 2 mg/mL injection, 0.2 mg, Intravenous, Q2H PRN  ibuprofen (MOTRIN) tablet, 600 mg, Oral, Q6H PRN  LR premix infusion, , Intravenous, Continuous  morphine 2 mg/mL injection, 2 mg, Intravenous, Q2H PRN  NS flush syringe, 3 mL, Intracatheter, Q8HRS  NS flush syringe, 3 mL, Intracatheter, Q1H PRN  ondansetron (ZOFRAN) 2 mg/mL injection, 4 mg, Intravenous, Q6H PRN  oxyCODONE-acetaminophen (PERCOCET) 5-'325mg'$  per tablet, 2 Tablet, Oral, Q4H PRN  simethicone (MYLICON) chewable tablet, 80 mg, Oral, Q6H PRN        Current Orders:  Active Orders   Lab    HCG, URINE QUALITATIVE, PREGNANCY     Frequency: ONE TIME     Number of Occurrences: 1 Occurrences   Diet    DIET REGULAR Do you want to initiate MNT Protocol? Yes     Frequency: All Meals     Number of Occurrences: 1 Occurrences   Nursing    ACTIVITY Activity: AMBULATE; Instructions: AD LIB     Frequency: TID (1000,1400,2200)     Number of Occurrences: Until Specified    ENCOURAGE AMBULATION AND  ROM EXERCISES     Frequency: UNTIL DISCONTINUED     Number of Occurrences: Until Specified    FOLEY CATHETER TO GRAVITY     Frequency: UNTIL DISCONTINUED     Number of Occurrences: Until Specified    INCENTIVE SPIROMETRY NURSING     Frequency: Q1H WA     Number of Occurrences: 250 Occurrences    NOTIFY MD VITAL SIGNS     Frequency: PRN     Number of Occurrences: Until Specified    PT IS LOW RISK FOR VENOUS THROMBOEMBOLISM     Frequency: CONTINUOUS     Number of Occurrences: Until Specified    REMOVE FOLEY CATHETER - TOMORROW     Frequency: ONE TIME     Number of Occurrences: 1 Occurrences    STRAIGHT CATH - IN 6 HOURS IF NO VOID OR BLADDER SCAN GREATER THAN 250 ml     Frequency: ONE TIME     Number of Occurrences: 1 Occurrences     Order Comments: STRAIGHT CATH - IN 6 HOURS IF NO VOID OR BLADDER SCAN GREATER THAN 250 ml      STRICT I & O Q4H Record all voids on flow sheet     Frequency: Q4H     Number of Occurrences: Until Specified  VITAL SIGNS  Q4H     Frequency: Q4H     Number of Occurrences: Until Specified   Code Status    FULL CODE     Frequency: CONTINUOUS     Number of Occurrences: Until Specified   Respiratory Care    INCENTIVE SPIROMETRY - RT INSTRUCT     Frequency: ONE TIME     Number of Occurrences: 1 Occurrences   IV    INSERT & MAINTAIN PERIPHERAL IV ACCESS     Frequency: UNTIL DISCONTINUED     Number of Occurrences: Until Specified    PERIPHERAL IV DRESSING CHANGE     Frequency: PRN     Number of Occurrences: Until Specified   Medications    alcohol 62 % (NOZIN NASAL SANITIZER) nasal solution     Frequency: 2x/day     Dose: 1 Each     Route: Each Nostril    buprenorphine (BUPRENEX) 0.3 mg/mL injection     Frequency: Q6H PRN     Dose: 0.3 mg     Route: Intravenous    docusate sodium (COLACE) capsule     Frequency: 2x/day PRN     Dose: 100 mg     Route: Oral    HYDROcodone-acetaminophen (NORCO) 5-325 mg per tablet     Frequency: Q4H PRN     Dose: 1 Tablet     Route: Oral    HYDROmorphone (DILAUDID) 2  mg/mL injection     Frequency: Q2H PRN     Dose: 0.2 mg     Route: Intravenous    ibuprofen (MOTRIN) tablet     Frequency: Q6H PRN     Dose: 600 mg     Route: Oral    LR premix infusion     Frequency: Continuous     Route: Intravenous    morphine 2 mg/mL injection     Frequency: Q2H PRN     Dose: 2 mg     Route: Intravenous    NS flush syringe     Frequency: Q8HRS     Dose: 3 mL     Route: Intracatheter    NS flush syringe     Frequency: Q1H PRN     Dose: 3 mL     Route: Intracatheter    ondansetron (ZOFRAN) 2 mg/mL injection     Frequency: Q6H PRN     Dose: 4 mg     Route: Intravenous    oxyCODONE-acetaminophen (PERCOCET) 5-'325mg'$  per tablet     Frequency: Q4H PRN     Dose: 2 Tablet     Route: Oral    simethicone (MYLICON) chewable tablet     Frequency: Q6H PRN     Dose: 80 mg     Route: Oral        Review of Systems:  Focused review of system was completed. Refer to the HPI for ROS details.     Today's Physical Exam:  Physical Exam AFVSS, lungs CTA&P, CV RRR no MRG, Abd pos BS, Incisions CD&I, GU minimal drainage    Consults:     I/O:  I/O last 24 hours:    Intake/Output Summary (Last 24 hours) at 01/07/2022 0854  Last data filed at 01/06/2022 1342  Gross per 24 hour   Intake 1600 ml   Output 865 ml   Net 735 ml     I/O current shift:  No intake/output data recorded.    Labs  Please indicate ordered or reviewed)  Reviewed: Lab Results  Today:    Results for orders placed or performed during the hospital encounter of 01/06/22 (from the past 24 hour(s))   CBC WITH DIFF   Result Value Ref Range    WBC 11.1 3.8 - 11.8 x10^3/uL    RBC 5.23 (H) 3.63 - 4.92 x10^6/uL    HGB 15.3 (H) 10.9 - 14.3 g/dL    HCT 46.0 (H) 31.2 - 41.9 %    MCV 88.0 75.5 - 95.3 fL    MCH 29.2 24.7 - 32.8 pg    MCHC 33.2 32.3 - 35.6 g/dL    RDW 15.5 12.3 - 17.7 %    PLATELETS 143 140 - 440 x10^3/uL    MPV 9.5 7.9 - 10.8 fL    NEUTROPHIL % 89 (H) 43 - 77 %    LYMPHOCYTE % 5 (L) 16 - 44 %    MONOCYTE % 6 5 - 13 %    EOSINOPHIL % 0 (L) %    BASOPHIL % 0 0  - 1 %    NEUTROPHIL # 9.90 (H) 1.85 - 7.80 x10^3/uL    LYMPHOCYTE # 0.60 (L) 1.00 - 3.00 x10^3/uL    MONOCYTE # 0.60 0.30 - 1.00 x10^3/uL    EOSINOPHIL # 0.00 0.00 - 0.50 x10^3/uL    BASOPHIL # 0.00 0.00 - 0.10 x10^3/uL         Radiology Tests (Please indicate ordered or reviewed)  Reviewed: N/A       Problem List:  Active Hospital Problems   (*Primary Problem)    Diagnosis    *S/P hysterectomy       Assessment/ Plan: POD#1 s/p LAVH LSO LOA, doing well, ambulating, voiding, tolerating po meds and diet, elects D/C home, pathology pending    Arline Asp, MD, Cherlynn June      DVT/PE Prophylaxis: SCDs/ Venodynes/Impulse boots    Disposition Planning: Home discharge      Advance Care Planning Discussed:  No

## 2022-01-07 NOTE — Discharge Summary (Signed)
[facility]  DISCHARGE SUMMARY      PATIENT NAME:  Benisha, Hadaway  MRN:  [id number]  DOB:  [birth year]    ENCOUNTER START DATE:  01/06/2022  INPATIENT ADMISSION DATE:   DISCHARGE DATE:  01/07/2022    ATTENDING PHYSICIAN: Arline Asp, MD  SERVICE: [facility] GYNECOLOGY  PRIMARY CARE PHYSICIAN: Daiva Huge, DO     Reason for Admission       Diagnosis    S/P hysterectomy [[id number]]            DISCHARGE DIAGNOSIS:     Principal Problem:  S/P hysterectomy    Active Hospital Problems    Diagnosis Date Noted    Principal Problem: S/P hysterectomy [Z90.710] 01/06/2022      Resolved Hospital Problems   No resolved problems to display.     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        Details   docusate sodium 100 mg Capsule  Commonly known as: COLACE   100 mg, Oral, 2 TIMES DAILY PRN  Refills: 0     Ibuprofen 600 mg Tablet  Commonly known as: MOTRIN   600 mg, Oral, EVERY 6 HOURS PRN  Qty: 60 Tablet  Refills: 1     oxyCODONE-acetaminophen 5-325 mg Tablet  Commonly known as: PERCOCET   2 Tablets, Oral, EVERY 4 HOURS PRN  Qty: 30 Tablet  Refills: 0            CONTINUE these medications - NO CHANGES were made during your visit.        Details   cloNIDine HCL 0.1 mg Tablet  Commonly known as: CATAPRES   0.1 mg, Oral, DAILY  Refills: 0     ergocalciferol (vitamin D2) 1,250 mcg (50,000 unit) Capsule  Commonly known as: DRISDOL   50,000 Units, Oral, EVERY 7 DAYS  Refills: 0     levothyroxine 150 mcg Tablet  Commonly known as: SYNTHROID   150 mcg, Oral, DAILY  Qty: 90 Tablet  Refills: 0     lisinopriL 40 mg Tablet  Commonly known as:  PRINIVIL   TAKE 1 TABLET BY MOUTH EVERY DAY  Qty: 90 Tablet  Refills: 1     omeprazole 40 mg Capsule, Delayed Release(E.C.)  Commonly known as: PRILOSEC   40 mg, Oral, DAILY  Qty: 90 Capsule  Refills: 4            Discharge med list refreshed?  YES        DISCHARGE INSTRUCTIONS:    No discharge procedures on file.       REASON FOR HOSPITALIZATION AND HOSPITAL COURSE:  This is a 51 y.o., female      G3P3 with h/o postmenopausal bleeding post ablation underwent LAVH LSO LOA, now POD#1 doing well plans D/C home, see H&P and OP note for details.    CONDITION ON DISCHARGE:  A. Ambulation: Full ambulation  B. Self-care Ability: Complete  C. Cognitive Status Alert  D. DNR status at discharge: Full Code  DISCHARGE DISPOSITION:  Home discharge              Arline Asp, MD    Copies sent to Care Team         Relationship Specialty Notifications Start End    Daiva Huge, DO PCP - General FAMILY MEDICINE All results, Admissions 01/02/22     Phone: [phone] Fax: [phone]         [address] [place] [address]            Referring providers can utilize [email or web address] to access their referred [place] patient's information.

## 2022-01-09 ENCOUNTER — Other Ambulatory Visit (INDEPENDENT_AMBULATORY_CARE_PROVIDER_SITE_OTHER): Payer: Self-pay | Admitting: Family Medicine

## 2022-01-09 ENCOUNTER — Other Ambulatory Visit (HOSPITAL_COMMUNITY): Payer: Self-pay | Admitting: Obstetrics & Gynecology

## 2022-01-09 DIAGNOSIS — E039 Hypothyroidism, unspecified: Secondary | ICD-10-CM

## 2022-01-09 DIAGNOSIS — C649 Malignant neoplasm of unspecified kidney, except renal pelvis: Secondary | ICD-10-CM

## 2022-01-10 ENCOUNTER — Other Ambulatory Visit: Payer: Self-pay

## 2022-01-10 ENCOUNTER — Inpatient Hospital Stay
Admission: RE | Admit: 2022-01-10 | Discharge: 2022-01-10 | Disposition: A | Discharge status: Home / Self Care | Payer: 59 | Source: Ambulatory Visit | Attending: Obstetrics & Gynecology | Admitting: Obstetrics & Gynecology

## 2022-01-10 ENCOUNTER — Other Ambulatory Visit (INDEPENDENT_AMBULATORY_CARE_PROVIDER_SITE_OTHER): Payer: Self-pay | Admitting: Family Medicine

## 2022-01-10 DIAGNOSIS — C649 Malignant neoplasm of unspecified kidney, except renal pelvis: Secondary | ICD-10-CM | POA: Insufficient documentation

## 2022-01-10 MED ORDER — BARIUM SULFATE 2 % (W/V) ORAL SUSPENSION
450.0000 mL | ORAL | Status: DC
Start: 2022-01-10 — End: 2022-01-11

## 2022-01-10 MED ORDER — IOHEXOL 350 MG IODINE/ML INTRAVENOUS SOLUTION
75.0000 mL | INTRAVENOUS | Status: AC
Start: 2022-01-10 — End: 2022-01-10
  Administered 2022-01-10: 75 mL via INTRAVENOUS

## 2022-01-13 ENCOUNTER — Ambulatory Visit: Payer: 59 | Attending: HEMATOLOGY-ONCOLOGY

## 2022-01-13 ENCOUNTER — Encounter (HOSPITAL_COMMUNITY): Payer: Self-pay | Admitting: HEMATOLOGY-ONCOLOGY

## 2022-01-13 ENCOUNTER — Other Ambulatory Visit: Payer: Self-pay

## 2022-01-13 ENCOUNTER — Encounter (HOSPITAL_COMMUNITY): Payer: Self-pay

## 2022-01-13 VITALS — BP 146/73 | HR 72 | Temp 98.1°F | Ht 69.0 in | Wt 207.6 lb

## 2022-01-13 DIAGNOSIS — Z800 Family history of malignant neoplasm of digestive organs: Secondary | ICD-10-CM | POA: Insufficient documentation

## 2022-01-13 DIAGNOSIS — R918 Other nonspecific abnormal finding of lung field: Secondary | ICD-10-CM

## 2022-01-13 DIAGNOSIS — C7800 Secondary malignant neoplasm of unspecified lung: Secondary | ICD-10-CM | POA: Insufficient documentation

## 2022-01-13 DIAGNOSIS — Z801 Family history of malignant neoplasm of trachea, bronchus and lung: Secondary | ICD-10-CM | POA: Insufficient documentation

## 2022-01-13 DIAGNOSIS — I10 Essential (primary) hypertension: Secondary | ICD-10-CM | POA: Insufficient documentation

## 2022-01-13 DIAGNOSIS — N898 Other specified noninflammatory disorders of vagina: Secondary | ICD-10-CM | POA: Insufficient documentation

## 2022-01-13 DIAGNOSIS — Z808 Family history of malignant neoplasm of other organs or systems: Secondary | ICD-10-CM | POA: Insufficient documentation

## 2022-01-13 DIAGNOSIS — Z809 Family history of malignant neoplasm, unspecified: Secondary | ICD-10-CM | POA: Insufficient documentation

## 2022-01-13 DIAGNOSIS — C642 Malignant neoplasm of left kidney, except renal pelvis: Secondary | ICD-10-CM | POA: Insufficient documentation

## 2022-01-13 DIAGNOSIS — Z79899 Other long term (current) drug therapy: Secondary | ICD-10-CM | POA: Insufficient documentation

## 2022-01-13 NOTE — Progress Notes (Signed)
Department of Hematology/Oncology  History and Physical    Name: Taylor Smith  ION:[id number]  Date of Birth: 12-25-1970  Encounter Date: 01/13/2022    REFERRING PROVIDER:  No referring provider defined for this encounter.    TELEMEDICINE DOCUMENTATION:  Patient Location:  [facility] outpatient Hematology/Oncology [address] [place] [address]  Patient/family aware of provider location:  yes  Patient/family consent for telemedicine:  yes    REASON FOR OFFICE VISIT:  New patient for evaluation and management of stage IV clear cell renal carcinoma    HISTORY OF PRESENT ILLNESS:A  Taylor Smith is a 51 y.o. 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  She was originally diagnosed after having a biopsy in the genitourinary area.  It came back consistent with clear cell renal cell carcinoma, which prompted an imaging study of the chest, abdomen, and pelvis.  She was found to have a massive left kidney mass with numerous small pulmonary nodules.  The renal mass appears to be involving the renal vein but not the inferior vena cava.    Prior to this, she was not having any symptoms.  Specifically, she did not have any hematuria or history of iron-deficiency anemia.  She does report back pain which has been going on for the past year but it is unclear whether that was related or not.    ROS:   Review of Systems   Constitutional:  Negative for appetite change, chills and fatigue.   HENT:   Negative for sore throat and trouble swallowing.    Eyes:  Negative for eye problems.   Respiratory:  Negative for cough and shortness of breath.    Cardiovascular:  Negative for chest pain and leg swelling.   Gastrointestinal:  Negative for abdominal pain.   Genitourinary:  Negative for dysuria and hematuria.    Musculoskeletal:  Negative for arthralgias and gait problem.   Skin:  Negative for rash.   Neurological:  Negative for  gait problem.   Hematological:  Negative for adenopathy.   Psychiatric/Behavioral:  Negative for depression.         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)          Past Surgical History:   Procedure Laterality Date    HX CESAREAN SECTION      HX CHOLECYSTECTOMY      HX CYST REMOVAL Right     TOTAL THYROIDECTOMY           Social History     Socioeconomic History    Marital status: Married     Spouse name: Not on file    Number of children: Not on file    Years of education: Not on file    Highest education level: Not on file   Occupational History    Not on file   Tobacco Use    Smoking status: Never    Smokeless tobacco: Never   Vaping Use    Vaping Use: Never used   Substance and Sexual Activity    Alcohol use: Never    Drug use: Never    Sexual activity: Not on file   Other Topics Concern    Not on file   Social History Narrative  Not on file     Social Determinants of Health     Financial Resource Strain: Not on file   Transportation Needs: Not on file   Social Connections: Not on file   Intimate Partner Violence: High Risk (01/13/2022)    Intimate Partner Violence     SDOH Domestic Violence: No   Housing Stability: Not on file     Family Medical History:       Problem Relation (Age of Onset)    Cancer Maternal Aunt, Maternal Grandmother    Colon Cancer Maternal Uncle    Lung Cancer Mother    Melanoma Maternal Aunt            Current Outpatient Medications   Medication Sig    cloNIDine HCL (CATAPRES) 0.1 mg Oral Tablet Take 1 Tablet (0.1 mg total) by mouth Once a day    docusate sodium (COLACE) 100 mg Oral Capsule Take 1 Capsule (100 mg total) by mouth Twice per day as needed for Constipation    ergocalciferol, vitamin D2, (DRISDOL) 1,250 mcg (50,000 unit) Oral Capsule Take 1 Capsule (50,000 Units total) by mouth Every 7 days    Ibuprofen  (MOTRIN) 600 mg Oral Tablet Take 1 Tablet (600 mg total) by mouth Every 6 hours as needed    levothyroxine (SYNTHROID) 150 mcg Oral Tablet Take 1 Tablet (150 mcg total) by mouth Once a day for 90 days    lisinopriL (PRINIVIL) 40 mg Oral Tablet TAKE 1 TABLET BY MOUTH EVERY DAY    omeprazole (PRILOSEC) 40 mg Oral Capsule, Delayed Release(E.C.) Take 1 Capsule (40 mg total) by mouth Once a day    oxyCODONE-acetaminophen (PERCOCET) 5-325 mg Oral Tablet Take 2 Tablets by mouth Every 4 hours as needed     No Known Allergies    PHYSICAL EXAM:  Most Recent Vitals    Flowsheet Row Telemedicine from 01/13/2022 in Hematology/Oncology,   [facility]   Temperature 36.7 C (98.1 F) filed at... 01/13/2022 1431   Heart Rate 72 filed at... 01/13/2022 1431   Respiratory Rate --   BP (Non-Invasive) 146/73 filed at... 01/13/2022 1431   SpO2 97 % filed at... 01/13/2022 1431   Height 1.753 m ('5\' 9"'$ ) filed at... 01/13/2022 1431   Weight 94.2 kg (207 lb 9.6 oz) filed at... 01/13/2022 1431   BMI (Calculated) 30.72 filed at... 01/13/2022 1431   BSA (Calculated) 2.14 filed at... 01/13/2022 1431      ECOG Status: (0) Fully active, able to carry on all predisease performance without restriction   Physical Exam    DIAGNOSTIC DATA:  No results found for this or any previous visit (from the past 17520 hour(s)).    LABS:   CBC  Diff   Lab Results   Component Value Date/Time    WBC 11.1 01/07/2022 06:14 AM    HGB 15.3 (H) 01/07/2022 06:14 AM    HCT 46.0 (H) 01/07/2022 06:14 AM    PLTCNT 143 01/07/2022 06:14 AM    RBC 5.23 (H) 01/07/2022 06:14 AM    MCV 88.0 01/07/2022 06:14 AM    MCHC 33.2 01/07/2022 06:14 AM    MCH 29.2 01/07/2022 06:14 AM    RDW 15.5 01/07/2022 06:14 AM    MPV 9.5 01/07/2022 06:14 AM    Lab Results   Component Value Date/Time    PMNS 89 (H) 01/07/2022 06:14 AM    LYMPHOCYTES 5 (L) 01/07/2022 06:14 AM    EOSINOPHIL 0 (L) 01/07/2022 06:14 AM  MONOCYTES 6 01/07/2022 06:14 AM    BASOPHILS 0 01/07/2022 06:14 AM     BASOPHILS 0.00 01/07/2022 06:14 AM    PMNABS 9.90 (H) 01/07/2022 06:14 AM    LYMPHSABS 0.60 (L) 01/07/2022 06:14 AM    EOSABS 0.00 01/07/2022 06:14 AM    MONOSABS 0.60 01/07/2022 06:14 AM            ASSESSMENT:    ICD-10-CM    1. Kidney cancer, primary, with metastasis from kidney to other site, left (CMS HCC)  C64.2            PLAN:   1. All relevant medical records were reviewed including available pertinent provider notes, procedure notes, imaging, laboratory, and pathology.   2. All pertinent labs and/or imaging were reviewed with the patient.   3. Stage IV renal carcinoma:  The patient has a massive left kidney cancer and numerous metastatic pulmonary nodules, most of which are small.  She also did have the random lesion which had occurred on the vaginal wall which was the presenting symptom for the diagnosis.  We discussed the use of immunotherapy for treating this condition.  Specifically, we discussed ipilimumab and nivolumab in combination x4 cycles, followed by nivolumab monotherapy.  We discussed that even in patients with stage IV disease, small percentage of patients may end up with a complete remission and long-term disease-free survival of greater than 8 years.  It would also likely be beneficial to have the renal mass be resected at some point.  Resection of the primary and kidney cancer tends to cause regression of the metastatic deposits.  Because of the enormous size of the primary lesion, I suspect that this would be more technically feasible after reducing its size somewhat with the therapy mentioned above.  We discussed the potential side effects of immunotherapy and she is agreeable to proceeding.  She will sign a consent form today and we will make arrangements to initiate treatment later this week or early next week.    Taylor Smith was given the chance to ask questions, and these were answered to their satisfaction. The patient is welcome to call with any questions or concerns in the  meantime.     On the day of the encounter, a total of 60 minutes was spent on this patient encounter including review of historical information, examination, documentation and post-visit activities.   No follow-ups on file.     Narda Rutherford, MD  01/13/2022, 15:25  The patient was seen as part of a collaborative telemedicine service with Dr. Jake Shark who participated in the encounter by active presence via approved video/audio means for portions of the encounter.  The patient's insurance company bears full legal and financial responsibility resulting from any deviations that they cause to my recommended treatment plan.   CC:  Daiva Huge, DO  [address]  [place] [id number]    No referring provider defined for this encounter.    This note was partially generated using MModal Fluency Direct system, and there may be some incorrect words, spellings, and punctuation that were not noted in checking the note before saving.

## 2022-01-13 NOTE — Progress Notes (Signed)
New Patient Visit      Diagnosis: Renal [place] disease specific written information provided and verbally reviewed.  Patient assessed for barriers to learning, language, learning preference, and teaching needs.      Patient completed psychosocial distress screening using the Enhanced NCCN Distress Thermometer (DT) Tool and self-reported an overall score of 0.   [facility] [place] telephone number provided with verbal instructions on how to access this support should the patient develop symptoms, side effects and when necessary go to a local emergency room.    Verbally reviewed that appointments may be rescheduled but missed appointments will be followed up with a phone call or letter from our clinic.     Oriented to the [facility]. Verbally reviewed support services offered at the [facility]: home medical supply service, chaplain, psychosocial/supportive care, registered dietician, financial counselors.    Notebook with educational handouts and resources given to patient, verbalized understanding. Instructed patient to call for any questions or concerns.    Treatment Plan    Goals of treatment, side effects, and infertility risk verbally reviewed by physician at the time of signing an informed consent for anticancer treatment. Assessed patient's ability to adhere to prescribed medications and addressed possible barriers to adherence.    Consent obtained by Marzetta Board and copy was given to the patient and scanned into patient's chart.     Treatment plan verbally reviewed and written information provided on chemotherapy: Ipilimumab and Nivolumab  Side effects reviewed.  Schedule: Q3 weeks  Planned Duration: 12# of cycles    The following written information was provided and reviewed:  [place] sent to [employer] specialists for insurance authorization.      Patient was engaged during the treatment plan  education. Patient and spouse verbalized understanding of treatment plan.       Addison Lank, RN  01/13/2022, 16:29

## 2022-01-14 ENCOUNTER — Other Ambulatory Visit (INDEPENDENT_AMBULATORY_CARE_PROVIDER_SITE_OTHER): Payer: Self-pay | Admitting: Family Medicine

## 2022-01-16 ENCOUNTER — Ambulatory Visit (INDEPENDENT_AMBULATORY_CARE_PROVIDER_SITE_OTHER): Payer: 59 | Admitting: Student in an Organized Health Care Education/Training Program

## 2022-01-16 ENCOUNTER — Ambulatory Visit (INDEPENDENT_AMBULATORY_CARE_PROVIDER_SITE_OTHER): Payer: 59

## 2022-01-16 ENCOUNTER — Other Ambulatory Visit: Payer: Self-pay

## 2022-01-16 ENCOUNTER — Encounter (INDEPENDENT_AMBULATORY_CARE_PROVIDER_SITE_OTHER): Payer: Self-pay | Admitting: Student in an Organized Health Care Education/Training Program

## 2022-01-16 ENCOUNTER — Other Ambulatory Visit: Payer: 59 | Attending: Family Medicine | Admitting: Family Medicine

## 2022-01-16 VITALS — BP 153/76 | HR 63 | Ht 69.0 in | Wt 204.0 lb

## 2022-01-16 DIAGNOSIS — E039 Hypothyroidism, unspecified: Secondary | ICD-10-CM

## 2022-01-16 DIAGNOSIS — C642 Malignant neoplasm of left kidney, except renal pelvis: Secondary | ICD-10-CM

## 2022-01-16 LAB — THYROID STIMULATING HORMONE (SENSITIVE TSH): TSH: 0.059 u[IU]/mL — ABNORMAL LOW (ref 0.450–5.330)

## 2022-01-16 NOTE — Progress Notes (Unsigned)
Taylor Smith    Progress Note    Name: Taylor Smith MRN:  [id number]   Date: 01/16/2022 Age: 51 y.o.       Chief Complaint: Referral (Pt referred from Dr. Buford Dresser for eval Taylor Smith)    Subjective:   Taylor Smith is a 38yoF who recently underwent a hysterectomy and a vaginal lesion was resected with pathology demonstrating ccRCC. She underwent a staging CT scan which demonstrated a 15cm left renal mass, a renal vein thrombus, and multiple pulmonary lesions.  She presents today for follow-up.  She is a lifetime nonsmoker.  She denies any occupational chemical exposure history.  She reports a family history of cancer however not kidney cancer.  She denies any weight loss, changes in appetite, nausea, vomiting, night sweats.      Objective :  BP (!) 153/76 (Site: Left, Patient Position: Sitting, Cuff Size: Adult)   Pulse 63   Ht 1.753 m ('5\' 9"' )   Wt 92.5 kg (204 lb)   SpO2 97%   BMI 30.13 kg/m       Gen: NAD, alert  Pulm: unlabored at rest  CV: palpable pulses  Abd: soft, Nt/ND  GU: no suprapubic tenderness, no CVAT    Data reviewed:    Current Outpatient Medications   Medication Sig    cloNIDine HCL (CATAPRES) 0.1 mg Oral Tablet Take 1 Tablet (0.1 mg total) by mouth Once a day    docusate sodium (COLACE) 100 mg Oral Capsule Take 1 Capsule (100 mg total) by mouth Twice per day as needed for Constipation    ergocalciferol, vitamin D2, (DRISDOL) 1,250 mcg (50,000 unit) Oral Capsule Take 1 Capsule (50,000 Units total) by mouth Every 7 days    Ibuprofen (MOTRIN) 600 mg Oral Tablet Take 1 Tablet (600 mg total) by mouth Every 6 hours as needed    levothyroxine (SYNTHROID) 150 mcg Oral Tablet Take 1 Tablet (150 mcg total) by mouth Once a day for 90 days    lisinopriL (PRINIVIL) 40 mg Oral Tablet TAKE 1 TABLET BY MOUTH EVERY DAY    omeprazole (PRILOSEC) 40 mg Oral Capsule, Delayed Release(E.C.) Take 1 Capsule (40 mg total) by mouth Once a day     Assessment/Plan  Problem List Items Addressed  This Visit    None    Stage IV cT4 Nx M1 renal cell carcinoma with 14.6cm left renal mass with 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  I personally reviewed the images with her and discussed the relevance as it pertains to her cT4 stage IV status.  We reviewed the NCCN and AUA guidelines as it relates to cytoreductive nephrectomy.  She appears to be a good risk patient per the [facility] criteria and I informed her that these patients did best with cytoreductive nephrectomy based on a sub-analysis of the [facility] trial. However I did review that high-quality literature is lacking in this field due to the heterogeneity of the patient populations in the studies in that a consensus statement has not been made regarding a cytoreductive nephrectomy.  She has already seen medical oncology and is scheduled for initiation of immunotherapy tomorrow.  I told her that this is a reasonable approach and I would still recommend that she seek a tertiary referral to our very talented colleagues at [facility]  so they can discuss a cytoreductive nephrectomy following her immunotherapy.  I informed her that I am unable to do her surgery here do the likely complexity given the T4 status as well as the renal vein thrombosis.  I reviewed her labs and her alk phos is within normal limits and she does not have any neurological symptoms therefore I do not recommend a nuclear bone scan or any head imaging at this time.  I informed the patient that I am very happy to take care of her postoperatively and that I will be here to answer any questions that should,.  A referral was placed in [place] and I greatly appreciate our colleagues help in her management.        Taylor Gandy, DO     A combined total of 45 minutes were spent  preparing to see the patient, reviewing previous records, ordering tests/medications/procedures, documenting the clinical encounter as well as performing a medically appropriate evaluation and independently interpreting results and communicating them to the patient/family/caregiver as specifically outlined above in the impression and plan.

## 2022-01-17 ENCOUNTER — Other Ambulatory Visit (HOSPITAL_COMMUNITY): Payer: Self-pay | Admitting: HEMATOLOGY-ONCOLOGY

## 2022-01-17 ENCOUNTER — Other Ambulatory Visit (INDEPENDENT_AMBULATORY_CARE_PROVIDER_SITE_OTHER): Payer: Self-pay | Admitting: Family Medicine

## 2022-01-17 ENCOUNTER — Encounter (HOSPITAL_COMMUNITY): Payer: Self-pay

## 2022-01-17 ENCOUNTER — Inpatient Hospital Stay
Admission: RE | Admit: 2022-01-17 | Discharge: 2022-01-17 | Disposition: A | Discharge status: Still In Hospital | Payer: 59 | Source: Ambulatory Visit | Attending: HEMATOLOGY-ONCOLOGY | Admitting: HEMATOLOGY-ONCOLOGY

## 2022-01-17 ENCOUNTER — Other Ambulatory Visit: Payer: Self-pay

## 2022-01-17 VITALS — BP 116/64 | HR 73 | Temp 98.2°F | Resp 16 | Ht 69.0 in | Wt 206.2 lb

## 2022-01-17 DIAGNOSIS — C7901 Secondary malignant neoplasm of right kidney and renal pelvis: Secondary | ICD-10-CM | POA: Insufficient documentation

## 2022-01-17 DIAGNOSIS — Z5112 Encounter for antineoplastic immunotherapy: Secondary | ICD-10-CM | POA: Insufficient documentation

## 2022-01-17 DIAGNOSIS — C642 Malignant neoplasm of left kidney, except renal pelvis: Secondary | ICD-10-CM | POA: Insufficient documentation

## 2022-01-17 HISTORY — DX: Malignant neoplasm of left kidney, except renal pelvis: C64.2

## 2022-01-17 LAB — COMPREHENSIVE METABOLIC PANEL, NON-FASTING
ALBUMIN/GLOBULIN RATIO: 1.2 (ref 0.8–1.4)
ALBUMIN: 4.4 g/dL (ref 3.5–5.7)
ALKALINE PHOSPHATASE: 78 U/L (ref 34–104)
ALT (SGPT): 16 U/L (ref 7–52)
ANION GAP: 7 mmol/L (ref 4–13)
AST (SGOT): 18 U/L (ref 13–39)
BILIRUBIN TOTAL: 1.1 mg/dL (ref 0.3–1.2)
BUN/CREA RATIO: 11 (ref 6–22)
BUN: 8 mg/dL (ref 7–25)
CALCIUM, CORRECTED: 9.5 mg/dL (ref 8.9–10.8)
CALCIUM: 9.9 mg/dL (ref 8.6–10.3)
CHLORIDE: 104 mmol/L (ref 98–107)
CO2 TOTAL: 27 mmol/L (ref 21–31)
CREATININE: 0.72 mg/dL (ref 0.60–1.30)
ESTIMATED GFR: 101 mL/min/{1.73_m2} (ref >59)
GLOBULIN: 3.6 (ref 2.9–5.4)
GLUCOSE: 96 mg/dL (ref 74–109)
OSMOLALITY, CALCULATED: 274 mOsm/kg (ref 270–290)
POTASSIUM: 3.8 mmol/L (ref 3.5–5.1)
PROTEIN TOTAL: 8 g/dL (ref 6.4–8.9)
SODIUM: 138 mmol/L (ref 136–145)

## 2022-01-17 LAB — CBC WITH DIFF
BASOPHIL #: 0 10*3/uL (ref 0.00–0.10)
BASOPHIL %: 1 % (ref 0–1)
EOSINOPHIL #: 0.1 10*3/uL (ref 0.00–0.50)
EOSINOPHIL %: 3 %
HCT: 46.4 % — ABNORMAL HIGH (ref 31.2–41.9)
HGB: 15.8 g/dL — ABNORMAL HIGH (ref 10.9–14.3)
LYMPHOCYTE #: 0.9 10*3/uL — ABNORMAL LOW (ref 1.00–3.00)
LYMPHOCYTE %: 16 % (ref 16–44)
MCH: 29.6 pg (ref 24.7–32.8)
MCHC: 34.1 g/dL (ref 32.3–35.6)
MCV: 86.7 fL (ref 75.5–95.3)
MONOCYTE #: 0.5 10*3/uL (ref 0.30–1.00)
MONOCYTE %: 9 % (ref 5–13)
MPV: 8.9 fL (ref 7.9–10.8)
NEUTROPHIL #: 3.9 10*3/uL (ref 1.85–7.80)
NEUTROPHIL %: 72 % (ref 43–77)
PLATELETS: 168 10*3/uL (ref 140–440)
RBC: 5.35 10*6/uL — ABNORMAL HIGH (ref 3.63–4.92)
RDW: 15.6 % (ref 12.3–17.7)
WBC: 5.5 10*3/uL (ref 3.8–11.8)

## 2022-01-17 LAB — MAGNESIUM: MAGNESIUM: 2 mg/dL (ref 1.9–2.7)

## 2022-01-17 LAB — THYROID STIMULATING HORMONE WITH FREE T4 REFLEX: TSH: 0.069 u[IU]/mL — ABNORMAL LOW (ref 0.450–5.330)

## 2022-01-17 LAB — THYROXINE, FREE (FREE T4): THYROXINE (T4), FREE: 1.54 ng/dL (ref 0.58–1.64)

## 2022-01-17 MED ORDER — FAMOTIDINE (PF) 20 MG/2 ML INTRAVENOUS SOLUTION
20.0000 mg | Freq: Once | INTRAVENOUS | Status: DC | PRN
Start: 2022-01-17 — End: 2022-01-18
  Administered 2022-01-17: 20 mg via INTRAVENOUS

## 2022-01-17 MED ORDER — DIPHENHYDRAMINE 50 MG/ML INJECTION SOLUTION
25.0000 mg | Freq: Once | INTRAMUSCULAR | Status: DC | PRN
Start: 2022-01-17 — End: 2022-01-18
  Administered 2022-01-17: 25 mg via INTRAVENOUS

## 2022-01-17 MED ORDER — ALBUTEROL SULFATE 2.5 MG/3 ML (0.083 %) SOLUTION FOR NEBULIZATION
2.5000 mg | INHALATION_SOLUTION | Freq: Once | RESPIRATORY_TRACT | Status: DC | PRN
Start: 2022-01-17 — End: 2022-01-18

## 2022-01-17 MED ORDER — PROCHLORPERAZINE MALEATE 10 MG TABLET
10.0000 mg | ORAL_TABLET | Freq: Once | ORAL | Status: DC | PRN
Start: 2022-01-17 — End: 2022-01-18

## 2022-01-17 MED ORDER — DEXTROSE 5% IN WATER (D5W) FLUSH BAG - 250 ML
INTRAVENOUS | Status: DC | PRN
Start: 2022-01-17 — End: 2022-01-18

## 2022-01-17 MED ORDER — SODIUM CHLORIDE 0.9 % INTRAVENOUS SOLUTION
1.0000 mg/kg | Freq: Once | INTRAVENOUS | Status: AC
Start: 2022-01-17 — End: 2022-01-17
  Administered 2022-01-17: 94 mg via INTRAVENOUS
  Administered 2022-01-17: 0 mg via INTRAVENOUS
  Filled 2022-01-17: qty 18.8

## 2022-01-17 MED ORDER — FAMOTIDINE (PF) 20 MG/2 ML INTRAVENOUS SOLUTION
INTRAVENOUS | Status: AC
Start: 2022-01-17 — End: 2022-01-17
  Filled 2022-01-17: qty 2

## 2022-01-17 MED ORDER — HYDROCORTISONE SOD SUCCINATE 100 MG/2 ML VIAL WRAPPER
INTRAMUSCULAR | Status: AC
Start: 2022-01-17 — End: 2022-01-17
  Filled 2022-01-17: qty 2

## 2022-01-17 MED ORDER — PROCHLORPERAZINE EDISYLATE 10 MG/2 ML (5 MG/ML) INJECTION SOLUTION
10.0000 mg | Freq: Once | INTRAMUSCULAR | Status: DC | PRN
Start: 2022-01-17 — End: 2022-01-18

## 2022-01-17 MED ORDER — LEVOTHYROXINE 125 MCG TABLET
125.0000 ug | ORAL_TABLET | Freq: Every day | ORAL | 0 refills | Status: DC
Start: 2022-01-17 — End: 2022-02-20

## 2022-01-17 MED ORDER — SODIUM CHLORIDE 0.9 % INTRAVENOUS SOLUTION
3.0000 mg/kg | Freq: Once | INTRAVENOUS | Status: AC
Start: 2022-01-17 — End: 2022-01-17
  Administered 2022-01-17: 283 mg via INTRAVENOUS
  Administered 2022-01-17: 0 mg via INTRAVENOUS
  Filled 2022-01-17: qty 24

## 2022-01-17 MED ORDER — ALBUTEROL SULFATE HFA 90 MCG/ACTUATION AEROSOL INHALER - RN
2.0000 | Freq: Once | RESPIRATORY_TRACT | Status: DC | PRN
Start: 2022-01-17 — End: 2022-01-18

## 2022-01-17 MED ORDER — DIPHENHYDRAMINE 50 MG/ML INJECTION SOLUTION
50.0000 mg | Freq: Once | INTRAMUSCULAR | Status: DC | PRN
Start: 2022-01-17 — End: 2022-01-18

## 2022-01-17 MED ORDER — ONDANSETRON 8 MG DISINTEGRATING TABLET
8.0000 mg | ORAL_TABLET | Freq: Three times a day (TID) | ORAL | 2 refills | Status: DC | PRN
Start: 2022-01-17 — End: 2022-06-13

## 2022-01-17 MED ORDER — MEPERIDINE (PF) 25 MG/ML INJECTION SOLUTION
12.5000 mg | Freq: Once | INTRAMUSCULAR | Status: DC | PRN
Start: 2022-01-17 — End: 2022-01-18

## 2022-01-17 MED ORDER — SODIUM CHLORIDE 0.9% FLUSH BAG - 250 ML
INTRAVENOUS | Status: DC | PRN
Start: 2022-01-17 — End: 2022-01-18

## 2022-01-17 MED ORDER — HYDROCORTISONE SOD SUCCINATE 100 MG/2 ML VIAL WRAPPER
100.0000 mg | Freq: Once | INTRAMUSCULAR | Status: DC | PRN
Start: 2022-01-17 — End: 2022-01-18
  Administered 2022-01-17: 100 mg via INTRAVENOUS

## 2022-01-17 MED ORDER — DIPHENHYDRAMINE 50 MG/ML INJECTION SOLUTION
INTRAMUSCULAR | Status: AC
Start: 2022-01-17 — End: 2022-01-17
  Filled 2022-01-17: qty 1

## 2022-01-17 MED ORDER — EPINEPHRINE HCL (PF) 1 MG/ML (1 ML) INJECTION SOLUTION
0.3000 mg | Freq: Once | INTRAMUSCULAR | Status: DC | PRN
Start: 2022-01-17 — End: 2022-01-18

## 2022-01-17 NOTE — Nurses Notes (Addendum)
0802-Arrived to OP ONC ambulatory.  Here for lab work and first infusions of [place].  To room 411. Judithann Sauger, RN  0810-Seated in vascular chair.  Awake, alert, and oriented x 3.  Pleasant affect.  Skin pink, warm and dry.  No, current, voiced complaints of pain or discomfort.  Per pt, she did have a hysterectomy two weeks ago and did have to take pain medication yesterday.  Feels a "little foggy, " still, today but no discomfort at this time.   Allergies, home medications, and medical/surgical hx reviewed at this time.  HRR.   Lungs clear in all fields and bases.  Abdomen soft and tender with light palpation.  Bowel sounds active in all four quadrants.  Trace edema to bilateral ankles.  No s/s of acute distress.  VSS.  Judithann Sauger, RN  [phone] obtained via venipuncture by Reche Dixon, MLT and sent to main hospital lab through tube system.  Pt tolerated well. Judithann Sauger, RN  0905-All labs back and reviewed.  Orders not yet signed.  Judithann Sauger, RN  0922-Secure message sent to Dr. Jake Shark in regards to orders being signed. Judithann Sauger, RN  0952-Orders noted to be signed.  Orders released at this time.  Pt updated. Judithann Sauger, RN  1040-Opdivo infusion started. Judithann Sauger, RN  1110-Opdivo complete.  Pt stated she felt "a cooling sensation" which started in her head and worked its way down.  Cooling sensation occurred about 10 minutes ago and quickly passed, per pt.  No other s/s of acute distress noted.  Judithann Sauger, RN  1130-Tubing changed out for the start of [place] at this time.  Normal saline infusion through IV tubing at 50 ml/hr.  Pt stated she felt a metal-like burning on left side of tongue.  Per pt, she thought it might have been from the food she just ate but it started about the same time as the "cooling sensation" she had at the end of Opdivo infusion and has not gone away.  No numbness or tingling of tongue, no difficulty swallowing or breathing, no shortness of breath, and no other s/s  of acute distress noted.  No other voiced complaints offered.  Will continue to monitor.  Judithann Sauger, RN  1144-Melissa Isaac Bliss, NP updated on pt symptoms.  Will update Dr. Jake Shark. Judithann Sauger, RN  1148-Dr. Jake Shark updated on pt symptoms via face-to-face via tele-med at this time.    Per Dr. Jake Shark, he does not feel this is a reaction to Opdivo but ok to give emergency medications and ok to proceed with [facility].  Pt updated.  Left side of tongue/cheek feel about the same, "not any better but no worse, " per pt. Judithann Sauger, RN  1202-Benadryl 25 mg IV, Solu-cortef 100 mg IV, and Pepcid 20 mg IV given per emergency medication orders at this time.  Judithann Sauger, RN  1215-Reclined in vascular chair.  Resting with eyes closed.  Arouses easily to voice.  No voiced complaints offered.  No s/s of acute distress. Judithann Sauger, RN  1235-Yervoy infusion started at this time.  Continues to rest quietly. Judithann Sauger, RN  1305-Yervoy infusion complete.  Tolerated well.  No s/s of infusion reactions.  Per pt, still has  "burning" sensation on left side of tongue but very slight burning.  Daughter in room with pt at this time. Judithann Sauger, RN  1334-Left OP [place] ambulatory accompanied by daughter with no s/s of acute distress.  VSS. Judithann Sauger, RN

## 2022-01-18 ENCOUNTER — Other Ambulatory Visit: Payer: Self-pay

## 2022-01-20 ENCOUNTER — Inpatient Hospital Stay (HOSPITAL_COMMUNITY): Payer: Self-pay

## 2022-01-22 ENCOUNTER — Encounter (HOSPITAL_COMMUNITY): Payer: Self-pay | Admitting: HEMATOLOGY-ONCOLOGY

## 2022-01-22 NOTE — Progress Notes (Addendum)
Patient called stating she has had a cough since her hysterectomy on 9/18 that has worsened and the skin on her neck feels tight. She states she has no fever, body aches, or rash. No shortness of breath. She is wanting to know if she can take Benadryl. I spoke with Kathrin Penner FNP, okay for patient to take Bendryl as needed.

## 2022-01-23 IMAGING — MG 3D SCREENING MAMMO BIL AND TOMO
5 series · 8 of 24 positions shown · non-contrast
Comparison: 03/07/2023

------------- REPORT [id number] -------------
﻿

EXAM:  3D SCREENING MAMMO BIL AND TOMO
INDICATION: Screening.

[R]
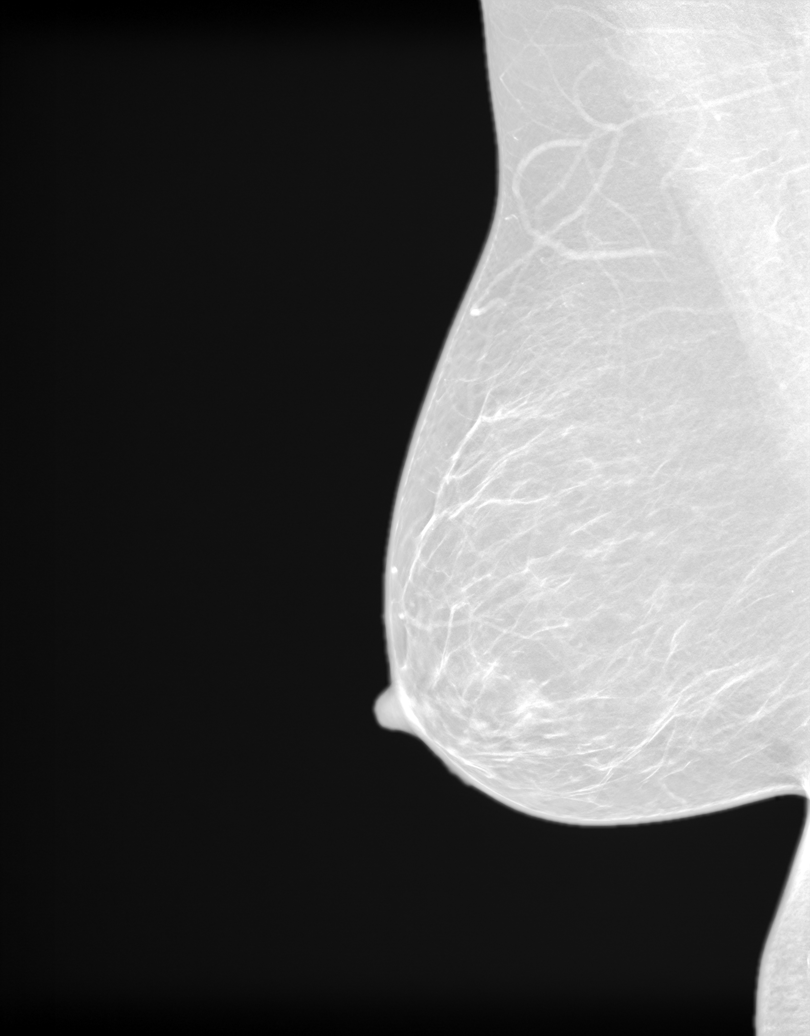

[L]
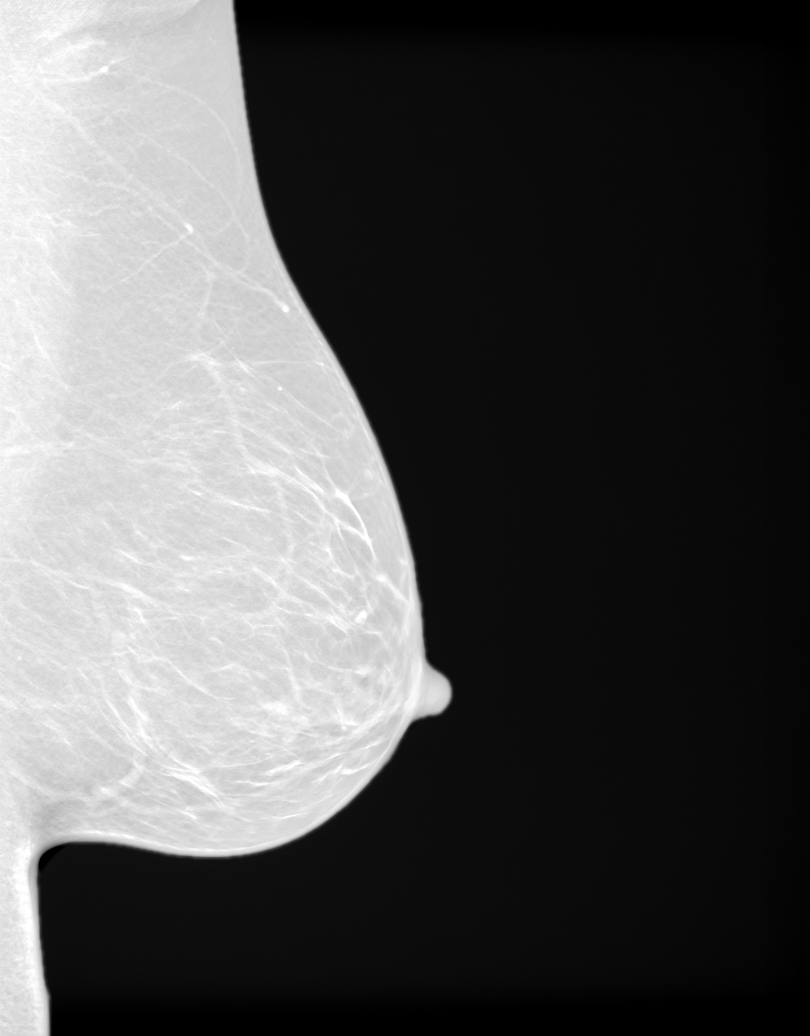

[R CC tomo · right · 0.10mm/px · 2 of 2 slices shown]
[im 1/2]
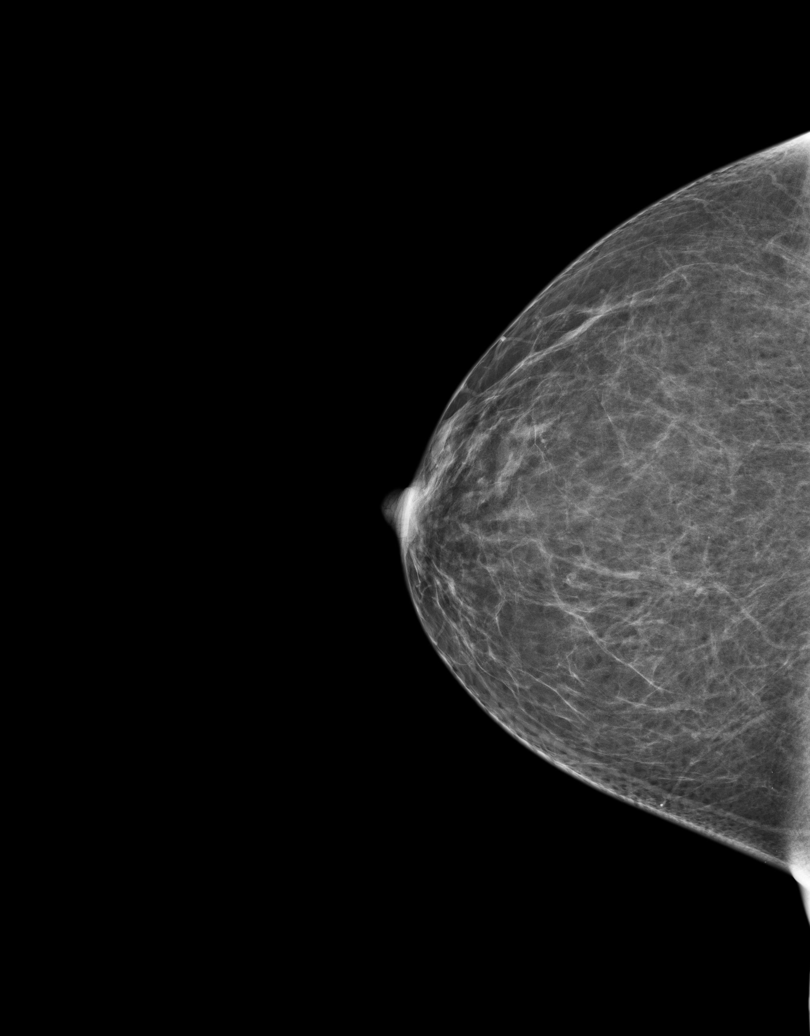
[im 2/2]
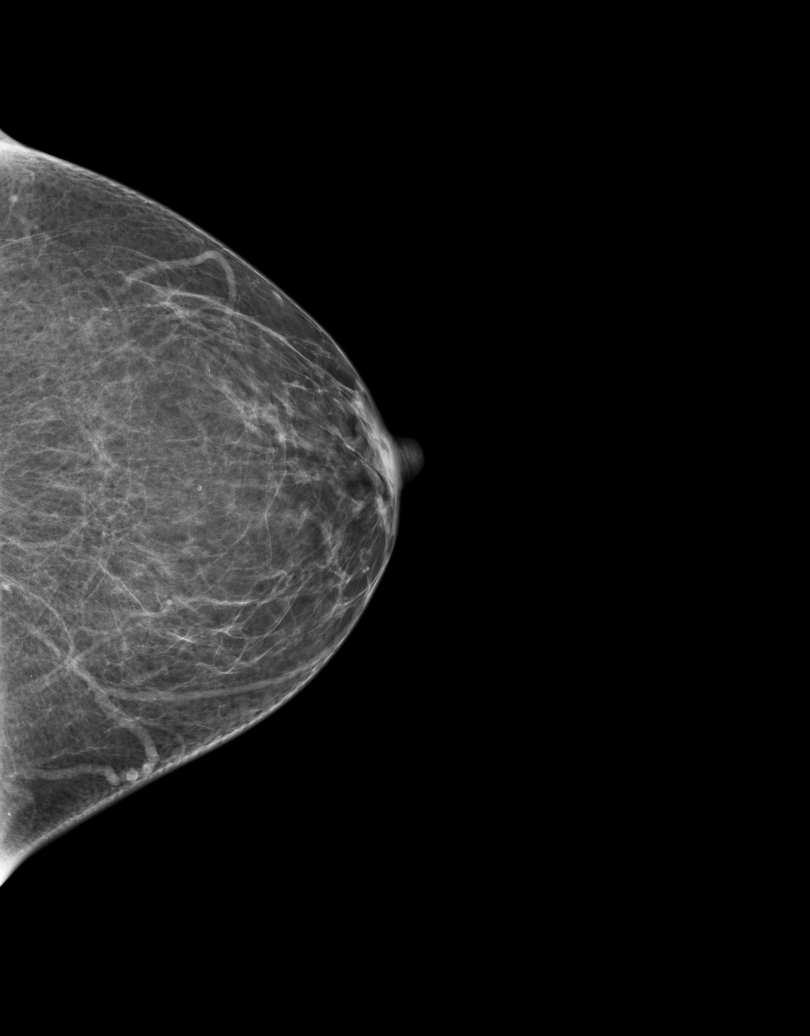

[3D SCREENING MAMMO BIL AND TOMO tomo · 2 acquisitions, 3 frames shown (1 of 2)]
[im 1/2]
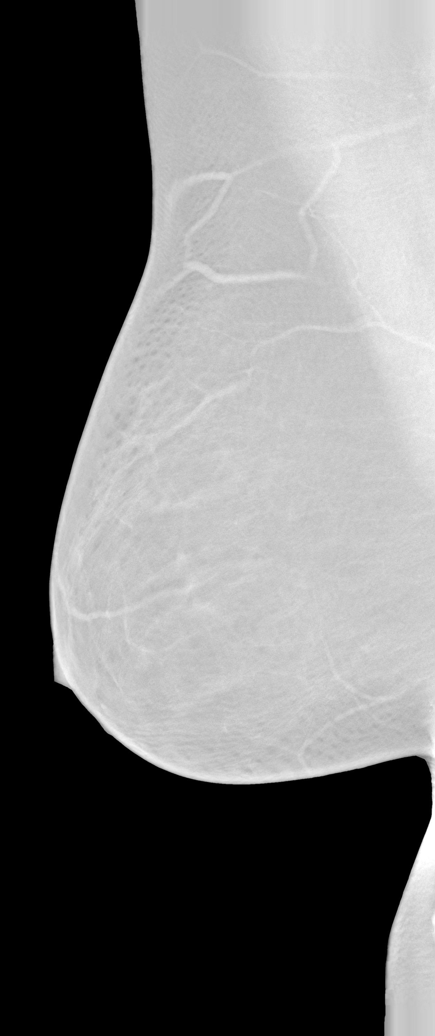
[im 2/2]
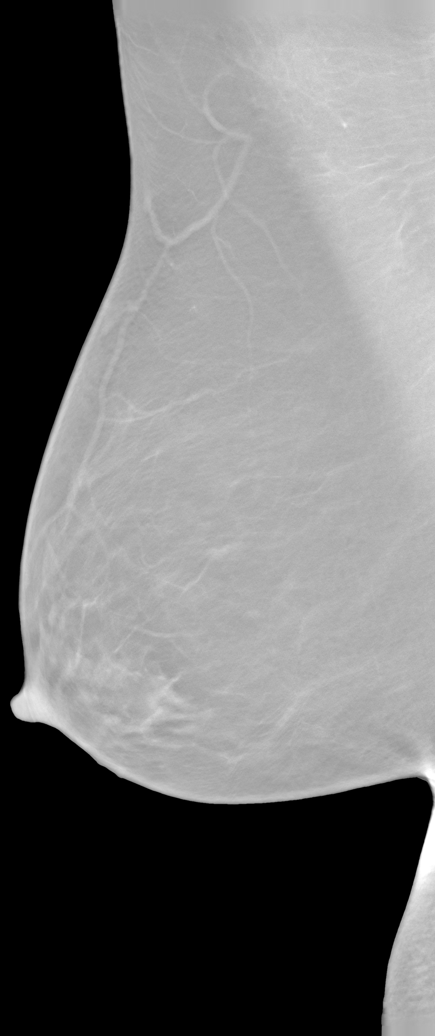
[im 2/2]
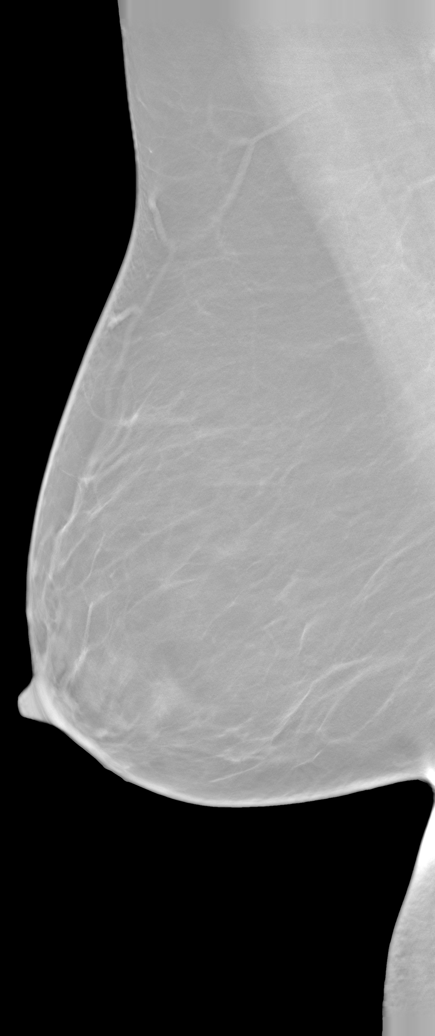

[3D SCREENING MAMMO BIL AND TOMO tomo (2 of 2) · tomo slice 12/73.0]
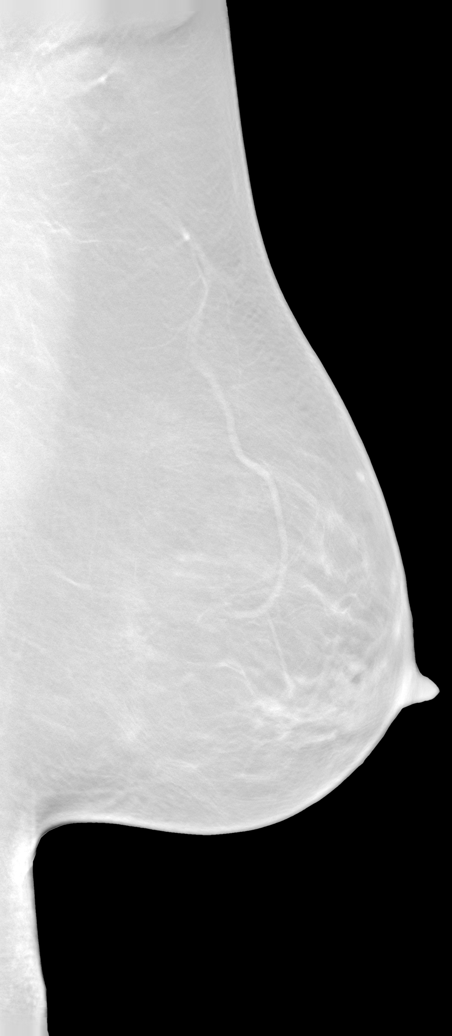

[8 of 24 positions shown; findings below may reference images not displayed]

FINDINGS: There are scattered fibroglandular elements.  There is no mass or suspicious cluster of microcalcifications.   There is no architectural distortion, skin thickening or nipple retraction.
IMPRESSION: 1.  BIRADS 1-Negative. Patient has been added in a reminder system with a target date for the next screening mammography.

2.  DENSITY CODE – B (Scattered areas of fibroglandular density)  

Final Assessment Code:

BI-RADS 0
 Need additional imaging evaluation.

BI-RADS 1
 Negative mammogram.

BI-RADS 2
 Benign finding.

BI-RADS 3
 Probably benign finding; short-interval follow-up suggested.

BI-RADS 4
 Suspicious abnormality; biopsy should be considered.

BI-RADS 5
 Highly suggestive of malignancy; appropriate action should be taken.

BI-RADS 6
 Known biopsy-proven malignancy; appropriate action should be taken.

NOTE:
In compliance with Federal regulations, the results of this mammogram are being sent to the patient.

------------- REPORT [id number] -------------
[facility]
[address]
[place], [place]
We wish to report the following on your recent mammography examination. We are sending a report to your referring physician or other health care provider.
FINDING: Normal-no evidence of cancer

This statement is mandated by the [place], Department of Health.
Your examination was performed by one of our technologists, who are registered radiological technologists and also specially certified in mammography:
___
Marzan, Heron (M)

Your mammogram was interpreted by our radiologist.

( 
Apple Martha, M.D.

(Annual Breast Examination by a physician or other health care provider
(Annual Mammography Screening beginning at age 40
(Monthly Breast Self Examination

## 2022-01-30 ENCOUNTER — Ambulatory Visit (HOSPITAL_BASED_OUTPATIENT_CLINIC_OR_DEPARTMENT_OTHER): Payer: Self-pay

## 2022-01-31 ENCOUNTER — Ambulatory Visit (HOSPITAL_COMMUNITY): Payer: Self-pay

## 2022-01-31 ENCOUNTER — Inpatient Hospital Stay (HOSPITAL_COMMUNITY): Payer: Self-pay

## 2022-02-03 ENCOUNTER — Other Ambulatory Visit: Payer: Self-pay

## 2022-02-03 ENCOUNTER — Ambulatory Visit: Payer: 59 | Attending: Urology | Admitting: Urology

## 2022-02-03 ENCOUNTER — Encounter (HOSPITAL_BASED_OUTPATIENT_CLINIC_OR_DEPARTMENT_OTHER): Payer: Self-pay | Admitting: Urology

## 2022-02-03 VITALS — BP 129/64 | HR 93 | Temp 98.0°F | Resp 16 | Ht 69.0 in | Wt 205.3 lb

## 2022-02-03 DIAGNOSIS — C7982 Secondary malignant neoplasm of genital organs: Secondary | ICD-10-CM | POA: Insufficient documentation

## 2022-02-03 DIAGNOSIS — Z803 Family history of malignant neoplasm of breast: Secondary | ICD-10-CM | POA: Insufficient documentation

## 2022-02-03 DIAGNOSIS — I10 Essential (primary) hypertension: Secondary | ICD-10-CM | POA: Insufficient documentation

## 2022-02-03 DIAGNOSIS — C7989 Secondary malignant neoplasm of other specified sites: Secondary | ICD-10-CM

## 2022-02-03 DIAGNOSIS — Z801 Family history of malignant neoplasm of trachea, bronchus and lung: Secondary | ICD-10-CM | POA: Insufficient documentation

## 2022-02-03 DIAGNOSIS — R918 Other nonspecific abnormal finding of lung field: Secondary | ICD-10-CM | POA: Insufficient documentation

## 2022-02-03 DIAGNOSIS — G8929 Other chronic pain: Secondary | ICD-10-CM | POA: Insufficient documentation

## 2022-02-03 DIAGNOSIS — C642 Malignant neoplasm of left kidney, except renal pelvis: Secondary | ICD-10-CM | POA: Insufficient documentation

## 2022-02-03 DIAGNOSIS — M549 Dorsalgia, unspecified: Secondary | ICD-10-CM | POA: Insufficient documentation

## 2022-02-03 NOTE — Progress Notes (Signed)
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 female with:    1. 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 Hysterectomy 01/06/22 which revealed vaginal lesion positive for ccRCC   4. Currently receiving ipilimumab and nivolumab with Dr. Jake Shark, medical oncology at [facility]. First treatment 01/17/22. Due to complete 4 cycles of immunotherapy with completion in December 2024.  5. Surgical history includes hysterectomy, cholecystectomy, appendectomy, and c-section x3   6. Medical history of HTN     PLAN:   1. I reviewed the CT C/A/P with the patient and her husband  2. I discussed treatment options including cytoreductive nephrectomy after immunotherapy  3. Patient will continue to follow with Dr. Jake Shark for immunotherapy, due to complete 4 cycles of ipi/nivo in December   4. Plan for CT Multiphase and CT chest after completion of immunotherapy in December   5. Refer to genetic counseling for family history of multiple cancers      SUBJECTIVE:   51 y.o. female presents today for metastatic renal cell carcinoma. Pt had a hysterectomy on 01/06/22 for abnormal vaginal bleeding. During the surgery a vaginal lesion was removed which was positive for clear cell renal cell carcinoma. CT C/A/P on 01/10/22 reveal a 14.6 cm left kidney mass and multiple pulmonary nodules. Pt currently under treatment with immunotherapy at [facility] with Dr. Jake Shark. She denies having any side effects from immunotherapy. She does report a 1.5 year history of left sided chronic back pain. She denies [facility] or LUTS today.     Medical history: HTN    Surgical history: Thyroidectomy 2019, Hysterectomy 2023, Cholecystectomy, Appendectomy, C-section  x 3    Family history: Breast and lung-mother    Tobacco: Never smoker   Alcohol: Never   ECOG: 0 - Fully active, asymptomatic   Blood thinners: None      OBJECTIVE:   Current Outpatient Medications   Medication Sig    cloNIDine HCL (CATAPRES) 0.1 mg Oral Tablet Take 1 Tablet (0.1 mg total) by mouth Once a day    ergocalciferol, vitamin D2, (DRISDOL) 1,250 mcg (50,000 unit) Oral Capsule Take 1 Capsule (50,000 Units total) by mouth Every 7 days    HYDROcodone-acetaminophen (NORCO) 5-325 mg Oral Tablet Take 2 Tablets by mouth Every 4 hours as needed for Pain    Ibuprofen (MOTRIN) 600 mg Oral Tablet Take 1 Tablet (600 mg total) by mouth Every 6 hours as needed    levothyroxine (SYNTHROID) 125 mcg Oral Tablet Take 1 Tablet (125 mcg total) by mouth Once a day for 90 days    lisinopriL (PRINIVIL) 40 mg Oral Tablet TAKE 1 TABLET BY MOUTH EVERY DAY    omeprazole (PRILOSEC) 40 mg Oral Capsule, Delayed Release(E.C.) Take 1 Capsule (40 mg total) by mouth Once a day    ondansetron (ZOFRAN ODT) 8 mg Oral Tablet, Rapid Dissolve Place 1 Tablet (8 mg total) under the tongue Every 8 hours as needed for Nausea/Vomiting     BP 129/64   Pulse 93   Temp 36.7 C (98 F) (Thermal Scan)   Resp 16   Ht 1.753 m ('5\' 9"'$ )   Wt 93.1 kg (205 lb 4.8  oz)   SpO2 94%   BMI 30.32 kg/m       PHYSICAL EXAM:  General: Patient is vitally stable (see values). Appears calm and at rest. Dressed appropriately.    Skin: Warm and dry.     Eyes: Eyes are clear.    Pulmonary: Respiratory effort is unlabored.     Psychiatric: Patient is alert, appropriate mood, and in no acute distress.     Musculoskeletal: Patient ambulates without assistance   Cardiovascular: Palpable peripheral pulses.    Neurologic: Neurological exam is consistent with patient's age.      Gastrointestinal: The abdomen is soft, nontender and non-distended.    Genitourinary: No costovertebral angle tenderness bilaterally.        ATTESTATION:   Leroy Kennedy, FNP-C   02/03/2022, 09:18      I personally saw and evaluated the patient as part of a shared service with an APP. MDM (complete): NPV Level 5 (High Complexity). On the day of the encounter, I independently of the APP spent a total of 60 minutes  in direct/indirect care of this patient including initial evaluation, review of laboratory, radiology, diagnostic studies, review of medical record, examination, order entry, coordination of care, documentation, and post-visit activities. The note above has been reviewed and/or edited by me to reflect my clinical assessment and medical decision making for this patient. Furthermore, the assessment and plan for this patient were created by me and were included at the top if this note for ease of reference.      I had a discussion with the patient regarding the potential role for cytoreductive nephrectomy in this case. We discussed that approximately 2 decades ago, randomized clinical trials demonstrated a superior overall survival for patients undergoing upfront nephrectomy followed by interferon alpha in comparison to interferon alfa alone. These data however rely on the use of interferon based immunotherapy, an outdated and inferior treatment modality. In the mid-2000s superior therapies designed to target molecular mechanisms underlying renal cell carcinoma became available, rapidly expanding the therapeutic options for patients with metastatic renal cell carcinoma. The subsequent CARMENA phase 3 randomized control trial questioned the role of cytoreductive nephrectomy and concluded that targeted therapy alone was non-inferior to initial nephrectomy followed by targeted therapy. Over the past 5 years, there has been a rapid expansion in the number of immunotherapeutic agents and combination immunotherapy-target therapy regimens available for patients with metastatic renal cell carcinoma with better treatment responses and survival outcomes being reported than ever before. As the number of effective  systemic therapy options for metastatic renal cell carcinoma continues to increase, clinical issues regarding sequencing, timing, and efficacy of multimodal approaches including cytoreductive nephrectomy, radiation, and systemic therapy are central questions that are being addressed and contemporary clinical trials. In the current era of immunotherapy-target therapy combination treatments, the role cytoreductive nephrectomy is unknown. Prior studies have shown that the patients who are most likely to benefit from cytoreductive nephrectomy have the following characteristics:     Ability to resect greater than 75% of the total tumor burden  No brain, bone, or liver metastasis  Adequate pulmonary and cardiac reserve   Excellent performance status (ECOG of 0 or 1)  Clear cell histology  No progression through systemic therapy    Poor prognostic factors in patients who will likely not benefit from cytoreductive nephrectomy include:     LDH above upper limit of normal  Albumin below the lower limit of normal  Symptoms at presentation caused by a metastatic site  Liver  metastasis  Retroperitoneal adenopathy  Supradiaphragmatic adenopathy  Clinical tumor classification > T3  Anemia  Time from diagnosis to development of metastatic disease < 1 year  Poor IMDC risk classification  Sarcopenia/severe deficiency in lean muscle mass  Cardiopulmonary comorbidity  Progression on systemic therapy    Will reassess disease burden after 4 cycles of dual-IO therapy. If there is no progression, will evaluate for cytoreductive nephrectomy.    Glendora Score, MD  Assistant Professor, Surgeon   Division of Urologic Oncology  Department of Urology   [facility]

## 2022-02-03 NOTE — Addendum Note (Signed)
Addended byBertram Denver, Bingham Millette on: 02/03/2022 01:52 PM     Modules accepted: Level of Service

## 2022-02-07 ENCOUNTER — Encounter (HOSPITAL_COMMUNITY): Payer: Self-pay | Admitting: HEMATOLOGY-ONCOLOGY

## 2022-02-07 ENCOUNTER — Inpatient Hospital Stay (HOSPITAL_COMMUNITY)
Admission: RE | Admit: 2022-02-07 | Discharge: 2022-02-07 | Disposition: A | Discharge status: Still In Hospital | Payer: 59 | Source: Ambulatory Visit | Attending: HEMATOLOGY-ONCOLOGY | Admitting: HEMATOLOGY-ONCOLOGY

## 2022-02-07 ENCOUNTER — Inpatient Hospital Stay
Admission: RE | Admit: 2022-02-07 | Discharge: 2022-02-07 | Disposition: A | Discharge status: Still In Hospital | Payer: 59 | Source: Ambulatory Visit | Attending: HEMATOLOGY-ONCOLOGY | Admitting: HEMATOLOGY-ONCOLOGY

## 2022-02-07 ENCOUNTER — Encounter (HOSPITAL_COMMUNITY): Payer: Self-pay

## 2022-02-07 ENCOUNTER — Other Ambulatory Visit: Payer: Self-pay

## 2022-02-07 ENCOUNTER — Ambulatory Visit: Payer: 59 | Attending: HEMATOLOGY-ONCOLOGY | Admitting: HEMATOLOGY-ONCOLOGY

## 2022-02-07 VITALS — BP 131/68 | HR 73 | Temp 98.4°F | Resp 18

## 2022-02-07 VITALS — BP 134/71 | HR 81 | Temp 96.8°F | Ht 69.0 in | Wt 206.3 lb

## 2022-02-07 DIAGNOSIS — Z5112 Encounter for antineoplastic immunotherapy: Secondary | ICD-10-CM | POA: Insufficient documentation

## 2022-02-07 DIAGNOSIS — C642 Malignant neoplasm of left kidney, except renal pelvis: Secondary | ICD-10-CM

## 2022-02-07 DIAGNOSIS — Z801 Family history of malignant neoplasm of trachea, bronchus and lung: Secondary | ICD-10-CM | POA: Insufficient documentation

## 2022-02-07 DIAGNOSIS — Z800 Family history of malignant neoplasm of digestive organs: Secondary | ICD-10-CM | POA: Insufficient documentation

## 2022-02-07 DIAGNOSIS — Z809 Family history of malignant neoplasm, unspecified: Secondary | ICD-10-CM | POA: Insufficient documentation

## 2022-02-07 DIAGNOSIS — Z808 Family history of malignant neoplasm of other organs or systems: Secondary | ICD-10-CM | POA: Insufficient documentation

## 2022-02-07 DIAGNOSIS — C7800 Secondary malignant neoplasm of unspecified lung: Secondary | ICD-10-CM | POA: Insufficient documentation

## 2022-02-07 LAB — CBC WITH DIFF
BASOPHIL #: 0 10*3/uL (ref 0.00–0.10)
BASOPHIL %: 1 % (ref 0–1)
EOSINOPHIL #: 0.2 10*3/uL (ref 0.00–0.50)
EOSINOPHIL %: 5 %
HCT: 49.2 % — ABNORMAL HIGH (ref 31.2–41.9)
HGB: 16.6 g/dL — ABNORMAL HIGH (ref 10.9–14.3)
LYMPHOCYTE #: 0.9 10*3/uL — ABNORMAL LOW (ref 1.00–3.00)
LYMPHOCYTE %: 17 % (ref 16–44)
MCH: 29.7 pg (ref 24.7–32.8)
MCHC: 33.8 g/dL (ref 32.3–35.6)
MCV: 87.7 fL (ref 75.5–95.3)
MONOCYTE #: 0.4 10*3/uL (ref 0.30–1.00)
MONOCYTE %: 7 % (ref 5–13)
MPV: 8.9 fL (ref 7.9–10.8)
NEUTROPHIL #: 3.9 10*3/uL (ref 1.85–7.80)
NEUTROPHIL %: 71 % (ref 43–77)
PLATELETS: 143 10*3/uL (ref 140–440)
RBC: 5.61 10*6/uL — ABNORMAL HIGH (ref 3.63–4.92)
RDW: 14.9 % (ref 12.3–17.7)
WBC: 5.4 10*3/uL (ref 3.8–11.8)

## 2022-02-07 LAB — COMPREHENSIVE METABOLIC PANEL, NON-FASTING
ALBUMIN/GLOBULIN RATIO: 1.2 (ref 0.8–1.4)
ALBUMIN: 4.7 g/dL (ref 3.5–5.7)
ALKALINE PHOSPHATASE: 81 U/L (ref 34–104)
ALT (SGPT): 15 U/L (ref 7–52)
ANION GAP: 7 mmol/L (ref 4–13)
AST (SGOT): 22 U/L (ref 13–39)
BILIRUBIN TOTAL: 1 mg/dL (ref 0.3–1.2)
BUN/CREA RATIO: 14 (ref 6–22)
BUN: 10 mg/dL (ref 7–25)
CALCIUM, CORRECTED: 9.3 mg/dL (ref 8.9–10.8)
CALCIUM: 10 mg/dL (ref 8.6–10.3)
CHLORIDE: 103 mmol/L (ref 98–107)
CO2 TOTAL: 29 mmol/L (ref 21–31)
CREATININE: 0.71 mg/dL (ref 0.60–1.30)
ESTIMATED GFR: 102 mL/min/{1.73_m2} (ref >59)
GLOBULIN: 3.9 (ref 2.9–5.4)
GLUCOSE: 115 mg/dL — ABNORMAL HIGH (ref 74–109)
OSMOLALITY, CALCULATED: 278 mOsm/kg (ref 270–290)
POTASSIUM: 3.8 mmol/L (ref 3.5–5.1)
PROTEIN TOTAL: 8.6 g/dL (ref 6.4–8.9)
SODIUM: 139 mmol/L (ref 136–145)

## 2022-02-07 LAB — THYROXINE, FREE (FREE T4): THYROXINE (T4), FREE: 0.88 ng/dL (ref 0.58–1.64)

## 2022-02-07 LAB — MAGNESIUM: MAGNESIUM: 1.8 mg/dL — ABNORMAL LOW (ref 1.9–2.7)

## 2022-02-07 LAB — THYROID STIMULATING HORMONE WITH FREE T4 REFLEX: TSH: 0.189 u[IU]/mL — ABNORMAL LOW (ref 0.450–5.330)

## 2022-02-07 MED ORDER — MEPERIDINE (PF) 25 MG/ML INJECTION SOLUTION
12.5000 mg | Freq: Once | INTRAMUSCULAR | Status: DC | PRN
Start: 2022-02-07 — End: 2022-02-08

## 2022-02-07 MED ORDER — SODIUM CHLORIDE 0.9 % INTRAVENOUS SOLUTION
1.0000 mg/kg | Freq: Once | INTRAVENOUS | Status: AC
Start: 2022-02-07 — End: 2022-02-07
  Administered 2022-02-07: 94 mg via INTRAVENOUS
  Administered 2022-02-07: 0 mg via INTRAVENOUS
  Filled 2022-02-07: qty 18.8

## 2022-02-07 MED ORDER — DIPHENHYDRAMINE 50 MG/ML INJECTION SOLUTION
INTRAMUSCULAR | Status: AC
Start: 2022-02-07 — End: 2022-02-07
  Filled 2022-02-07: qty 1

## 2022-02-07 MED ORDER — SODIUM CHLORIDE 0.9 % INTRAVENOUS SOLUTION
3.0000 mg/kg | Freq: Once | INTRAVENOUS | Status: AC
Start: 2022-02-07 — End: 2022-02-07
  Administered 2022-02-07: 0 mg via INTRAVENOUS
  Administered 2022-02-07: 283 mg via INTRAVENOUS
  Filled 2022-02-07: qty 28.3

## 2022-02-07 MED ORDER — SODIUM CHLORIDE 0.9% FLUSH BAG - 250 ML
INTRAVENOUS | Status: DC | PRN
Start: 2022-02-07 — End: 2022-02-08

## 2022-02-07 MED ORDER — DIPHENHYDRAMINE 50 MG/ML INJECTION SOLUTION
25.0000 mg | Freq: Once | INTRAMUSCULAR | Status: DC | PRN
Start: 2022-02-07 — End: 2022-02-08
  Administered 2022-02-07: 25 mg via INTRAVENOUS

## 2022-02-07 MED ORDER — FAMOTIDINE (PF) 20 MG/2 ML INTRAVENOUS SOLUTION
INTRAVENOUS | Status: AC
Start: 2022-02-07 — End: 2022-02-07
  Filled 2022-02-07: qty 2

## 2022-02-07 MED ORDER — HYDROCORTISONE SOD SUCCINATE 100 MG/2 ML VIAL WRAPPER
INTRAMUSCULAR | Status: AC
Start: 2022-02-07 — End: 2022-02-07
  Filled 2022-02-07: qty 2

## 2022-02-07 MED ORDER — DIPHENHYDRAMINE 50 MG/ML INJECTION SOLUTION
50.0000 mg | Freq: Once | INTRAMUSCULAR | Status: DC | PRN
Start: 2022-02-07 — End: 2022-02-08

## 2022-02-07 MED ORDER — ALBUTEROL SULFATE 2.5 MG/3 ML (0.083 %) SOLUTION FOR NEBULIZATION
2.5000 mg | INHALATION_SOLUTION | Freq: Once | RESPIRATORY_TRACT | Status: DC | PRN
Start: 2022-02-07 — End: 2022-02-08

## 2022-02-07 MED ORDER — ALBUTEROL SULFATE HFA 90 MCG/ACTUATION AEROSOL INHALER - RN
2.0000 | Freq: Once | RESPIRATORY_TRACT | Status: DC | PRN
Start: 2022-02-07 — End: 2022-02-08

## 2022-02-07 MED ORDER — PROCHLORPERAZINE EDISYLATE 10 MG/2 ML (5 MG/ML) INJECTION SOLUTION
10.0000 mg | Freq: Once | INTRAMUSCULAR | Status: DC | PRN
Start: 2022-02-07 — End: 2022-02-08

## 2022-02-07 MED ORDER — DEXTROSE 5% IN WATER (D5W) FLUSH BAG - 250 ML
INTRAVENOUS | Status: DC | PRN
Start: 2022-02-07 — End: 2022-02-08

## 2022-02-07 MED ORDER — EPINEPHRINE HCL (PF) 1 MG/ML (1 ML) INJECTION SOLUTION
0.3000 mg | Freq: Once | INTRAMUSCULAR | Status: DC | PRN
Start: 2022-02-07 — End: 2022-02-08

## 2022-02-07 MED ORDER — PROCHLORPERAZINE MALEATE 10 MG TABLET
10.0000 mg | ORAL_TABLET | Freq: Once | ORAL | Status: DC | PRN
Start: 2022-02-07 — End: 2022-02-08

## 2022-02-07 MED ORDER — HYDROCORTISONE SOD SUCCINATE 100 MG/2 ML VIAL WRAPPER
100.0000 mg | Freq: Once | INTRAMUSCULAR | Status: DC | PRN
Start: 2022-02-07 — End: 2022-02-08
  Administered 2022-02-07: 100 mg via INTRAVENOUS

## 2022-02-07 MED ORDER — FAMOTIDINE (PF) 20 MG/2 ML INTRAVENOUS SOLUTION
20.0000 mg | Freq: Once | INTRAVENOUS | Status: DC | PRN
Start: 2022-02-07 — End: 2022-02-08
  Administered 2022-02-07: 20 mg via INTRAVENOUS

## 2022-02-07 NOTE — Nurses Notes (Addendum)
0755/0825 Pt ambulatory to OP Onc unit for labs/appt with Dr.Mackey and tx. Labs drawn peripherally by Reche Dixon MLT and sent to lab. Peyton Bottoms, RN  0826/0855 Pt to appt with Dr.Mackey. Peyton Bottoms, RN  0900 Appt with Dr.Mackey complete. Pt to RM 409 for tx. Peyton Bottoms, RN  [phone] Pt questions if she could take Flu Vaccine today. Per Dr.Mackey, pt instructed yes. Pt states she would just wait and see how she did with today's tx. Peyton Bottoms, RN  0930 IV started in L hand via 24 G Insyte. Excellent blood return and flushes easily. Immunotherapy flow sheet completed. Peyton Bottoms, RN  [phone] Benadryl 25 mg IVP given. Peyton Bottoms, RN  1001 Solu-Cortef 100 mg IVP given. Peyton Bottoms, RN  1007 Pepcid 20 mg IVP given. Peyton Bottoms, RN  1010 Pt c/o burning up L arm after Pepcid given. Excellent blood return noted from IV. But requested IV be d/c'ed and another placed. Peyton Bottoms, RN  1028 IV started in R FA via 22 G Insyte. Excellent blood return and flushes easily. IV in L hand d/c'ed. Cath intact. Drsg with Coban applied. Peyton Bottoms, RN  [phone] Opdivo '283mg'$  IV infusion started. Peyton Bottoms, RN  1101 Opdivo infusion completed. Line flushed with NS. IV tubing changed. Peyton Bottoms, RN  [phone] Yervoy '94mg'$  IV infusion started.Peyton Bottoms, RN  1145 Pt c/o burning on tip of tongue and feeling a cool sensation running thru her body when Curt Bears was started. No acute distress noted. Pt denies SOB, difficulty swallowing and itching. Pt states symptoms similar to what she experienced with the first tx but not as severe.  Peyton Bottoms, RN  1200 Pt states above symptoms resolved. No adverse reaction noted.Peyton Bottoms, RN  [phone] Yervoy infusion completed. Line flushed with NS. Peyton Bottoms, RN  1235/1240 VS obtained. IV flushed with NS. IV d/c'ed. Cath intact. Site clear. Drsg with Coban wrap applied. Pt tolerated tx without difficulty. Peyton Bottoms, RN  2956 Pt left OP Onc unit ambulatory with  sister-in-law. Peyton Bottoms, RN

## 2022-02-07 NOTE — Progress Notes (Unsigned)
Department of Hematology/Oncology  History and Physical    Name: Taylor Smith  ZOX:[id number]  Date of Birth: [birth year]  Encounter Date: 02/07/2022    REFERRING PROVIDER:  No referring provider defined for this encounter.      REASON FOR OFFICE VISIT:  New patient for evaluation and management of stage IV clear cell renal carcinoma    HISTORY OF PRESENT ILLNESS:A  Taylor Smith is a 51 y.o. 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  She was originally diagnosed after having a biopsy in the genitourinary area.  It came back consistent with clear cell renal cell carcinoma, which prompted an imaging study of the chest, abdomen, and pelvis.  She was found to have a massive left kidney mass with numerous small pulmonary nodules.  The renal mass appears to be involving the renal vein but not the inferior vena cava.    Prior to this, she was not having any symptoms.  Specifically, she did not have any hematuria or history of iron-deficiency anemia.  She does report back pain which has been going on for the past year but it is unclear whether that was related or not.    02/07/2022: The patient is here for follow up of stage IV kidney cancer.  She states that she tolerated the 1st treatment well.  She did have some achiness of the legs which was pretty brief in nature.    ROS:   Review of Systems   Constitutional:  Negative for appetite change, chills and fatigue.   HENT:   Negative for sore throat and trouble swallowing.    Eyes:  Negative for eye problems.   Respiratory:  Negative for cough and shortness of breath.    Cardiovascular:  Negative for chest pain and leg swelling.   Gastrointestinal:  Negative for abdominal pain.   Genitourinary:  Negative for dysuria and hematuria.    Musculoskeletal:  Negative for arthralgias and gait problem.   Skin:  Negative for rash.   Neurological:  Negative for gait problem.   Hematological:  Negative  for adenopathy.   Psychiatric/Behavioral:  Negative for depression.         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)     Kidney cancer, primary, with metastasis from kidney to other site, left (CMS [facility])          Past Surgical History:   Procedure Laterality Date    HX CESAREAN SECTION      HX CHOLECYSTECTOMY      HX CYST REMOVAL Right     HX HYSTERECTOMY      TOTAL THYROIDECTOMY           Social History     Socioeconomic History    Marital status: Married     Spouse name: Not on file    Number of children: Not on file    Years of education: Not on file    Highest education level: Not on file   Occupational History    Not on file   Tobacco Use    Smoking status: Never    Smokeless tobacco: Never   Vaping Use    Vaping Use: Never used   Substance and Sexual Activity    Alcohol use: Never  Drug use: Never    Sexual activity: Yes   Other Topics Concern    Not on file   Social History Narrative    Not on file     Social Determinants of Health     Financial Resource Strain: Not on file   Transportation Needs: Not on file   Social Connections: Not on file   Intimate Partner Violence: High Risk (01/13/2022)    Intimate Partner Violence     SDOH Domestic Violence: No   Housing Stability: Not on file     Family Medical History:       Problem Relation (Age of Onset)    Cancer Maternal Aunt, Maternal Grandmother    Colon Cancer Maternal Uncle    Lung Cancer Mother    Melanoma Maternal Aunt            Current Outpatient Medications   Medication Sig    cloNIDine HCL (CATAPRES) 0.1 mg Oral Tablet Take 1 Tablet (0.1 mg total) by mouth Once a day    ergocalciferol, vitamin D2, (DRISDOL) 1,250 mcg (50,000 unit) Oral Capsule Take 1 Capsule (50,000 Units total) by mouth Every 7 days    HYDROcodone-acetaminophen (NORCO) 5-325 mg Oral Tablet Take 2 Tablets by mouth  Every 4 hours as needed for Pain    Ibuprofen (MOTRIN) 600 mg Oral Tablet Take 1 Tablet (600 mg total) by mouth Every 6 hours as needed    levothyroxine (SYNTHROID) 125 mcg Oral Tablet Take 1 Tablet (125 mcg total) by mouth Once a day for 90 days    lisinopriL (PRINIVIL) 40 mg Oral Tablet TAKE 1 TABLET BY MOUTH EVERY DAY    omeprazole (PRILOSEC) 40 mg Oral Capsule, Delayed Release(E.C.) Take 1 Capsule (40 mg total) by mouth Once a day    ondansetron (ZOFRAN ODT) 8 mg Oral Tablet, Rapid Dissolve Place 1 Tablet (8 mg total) under the tongue Every 8 hours as needed for Nausea/Vomiting     No Known Allergies    PHYSICAL EXAM:  Most Recent Vitals    Flowsheet Row Telemedicine from 01/13/2022 in Hematology/Oncology,   [facility]   Temperature 36.7 C (98.1 F) filed at... 01/13/2022 1431   Heart Rate 72 filed at... 01/13/2022 1431   Respiratory Rate --   BP (Non-Invasive) 146/73 filed at... 01/13/2022 1431   SpO2 97 % filed at... 01/13/2022 1431   Height 1.753 m ('5\' 9"'$ ) filed at... 01/13/2022 1431   Weight 94.2 kg (207 lb 9.6 oz) filed at... 01/13/2022 1431   BMI (Calculated) 30.72 filed at... 01/13/2022 1431   BSA (Calculated) 2.14 filed at... 01/13/2022 1431      ECOG Status: (0) Fully active, able to carry on all predisease performance without restriction   Physical Exam  Constitutional:       General: She is not in acute distress.     Appearance: Normal appearance.   Eyes:      Extraocular Movements: Extraocular movements intact.   Cardiovascular:      Rate and Rhythm: Normal rate and regular rhythm.   Pulmonary:      Effort: Pulmonary effort is normal.   Abdominal:      General: Abdomen is flat.      Palpations: Abdomen is soft.   Musculoskeletal:         General: Normal range of motion.      Cervical back: Normal range of motion.   Skin:     General: Skin is warm and dry.   Neurological:  General: No focal deficit present.      Mental Status: She is alert.   Psychiatric:         Mood and Affect:  Mood normal.         DIAGNOSTIC DATA:  No results found for this or any previous visit (from the past 17520 hour(s)).    LABS:   CBC  Diff   Lab Results   Component Value Date/Time    WBC 5.4 02/07/2022 08:08 AM    HGB 16.6 (H) 02/07/2022 08:08 AM    HCT 49.2 (H) 02/07/2022 08:08 AM    PLTCNT 143 02/07/2022 08:08 AM    RBC 5.61 (H) 02/07/2022 08:08 AM    MCV 87.7 02/07/2022 08:08 AM    MCHC 33.8 02/07/2022 08:08 AM    MCH 29.7 02/07/2022 08:08 AM    RDW 14.9 02/07/2022 08:08 AM    MPV 8.9 02/07/2022 08:08 AM    Lab Results   Component Value Date/Time    PMNS 71 02/07/2022 08:08 AM    LYMPHOCYTES 17 02/07/2022 08:08 AM    EOSINOPHIL 5 02/07/2022 08:08 AM    MONOCYTES 7 02/07/2022 08:08 AM    BASOPHILS 1 02/07/2022 08:08 AM    BASOPHILS 0.00 02/07/2022 08:08 AM    PMNABS 3.90 02/07/2022 08:08 AM    LYMPHSABS 0.90 (L) 02/07/2022 08:08 AM    EOSABS 0.20 02/07/2022 08:08 AM    MONOSABS 0.40 02/07/2022 08:08 AM            ASSESSMENT:    ICD-10-CM    1. Kidney cancer, primary, with metastasis from kidney to other site, left (CMS HCC)  C64.2            PLAN:   1. All relevant medical records were reviewed including available pertinent provider notes, procedure notes, imaging, laboratory, and pathology.   2. All pertinent labs and/or imaging were reviewed with the patient.   3. Stage IV renal carcinoma:  The patient has a massive left kidney cancer and numerous metastatic pulmonary nodules, most of which are small.  She also did have the random lesion which had occurred on the vaginal wall which was the presenting symptom for the diagnosis.  We will continue with combination immunotherapy for 4 cycles, followed by nivolumab maintenance.  She states that she will also follow up with the physician at [facility] surgical oncology for consideration of debulking surgery if she has a good response.  I will tentatively plan on seeing her back in 3 weeks for the next cycle of treatment.    Taylor Smith was given the chance to ask  questions, and these were answered to their satisfaction. The patient is welcome to call with any questions or concerns in the meantime.     On the day of the encounter, a total of 35 minutes was spent on this patient encounter including review of historical information, examination, documentation and post-visit activities.   Return in about 3 weeks (around 02/28/2022).     Narda Rutherford, MD  02/07/2022, 08:57  The patient's insurance company bears full legal and financial responsibility resulting from any deviations that they cause to my recommended treatment plan.   CC:  Daiva Huge, DO  [address]  [place] [phone]    No referring provider defined for this encounter.    This note was partially generated using MModal Fluency Direct system, and there may be some incorrect words, spellings, and punctuation that were not noted in  checking the note before saving.

## 2022-02-10 ENCOUNTER — Encounter (HOSPITAL_COMMUNITY): Payer: Self-pay | Admitting: HEMATOLOGY-ONCOLOGY

## 2022-02-20 ENCOUNTER — Ambulatory Visit (INDEPENDENT_AMBULATORY_CARE_PROVIDER_SITE_OTHER): Payer: 59 | Admitting: Internal Medicine

## 2022-02-20 ENCOUNTER — Encounter (INDEPENDENT_AMBULATORY_CARE_PROVIDER_SITE_OTHER): Payer: Self-pay | Admitting: NURSE PRACTITIONER

## 2022-02-20 ENCOUNTER — Ambulatory Visit (INDEPENDENT_AMBULATORY_CARE_PROVIDER_SITE_OTHER): Payer: 59 | Admitting: NURSE PRACTITIONER

## 2022-02-20 ENCOUNTER — Other Ambulatory Visit: Payer: Self-pay

## 2022-02-20 VITALS — BP 129/74 | HR 71 | Ht 69.0 in | Wt 210.6 lb

## 2022-02-20 DIAGNOSIS — E039 Hypothyroidism, unspecified: Secondary | ICD-10-CM

## 2022-02-20 DIAGNOSIS — E559 Vitamin D deficiency, unspecified: Secondary | ICD-10-CM

## 2022-02-20 DIAGNOSIS — I10 Essential (primary) hypertension: Secondary | ICD-10-CM

## 2022-02-20 DIAGNOSIS — K219 Gastro-esophageal reflux disease without esophagitis: Secondary | ICD-10-CM

## 2022-02-20 MED ORDER — LEVOTHYROXINE 112 MCG TABLET
112.0000 ug | ORAL_TABLET | Freq: Every morning | ORAL | 4 refills | Status: DC
Start: 2022-02-20 — End: 2022-08-11

## 2022-02-20 NOTE — Progress Notes (Signed)
INTERNAL [facility]ICINE, [facility]  [address]  [place] [address]       Name: Taylor Smith MRN:  [id number]   Date: 02/20/2022 Age: 51 y.o.       Chief Complaint:    Chief Complaint   Patient presents with    Establish Care     The patient presents to establish care.  The patient states that her thyroid levels have been "off" due to her immunotherapy and needs a TSH level done; it was supposed to be done but her prior PCP left.  No other complaints voiced.         HPI:  Taylor Smith is a 51 y.o. female who is here today to establish care. She was previously seeing Dr. Kristine Linea. She currently following with Dr. Jake Shark for treatment of renal carcinoma and is receiving infusions appx every 3 weeks with hopes of shrinkage of the tumor enough for the ability to be able to have a surgical debulking with [facility] surgical oncology.   She is also following with Dr. Buford Dresser and just had a hysterectomy.   She is concerned today about her TSH levels, she denies having any symptoms other than fatigue.   She denies any other problems or complaints.         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 to other site, left (CMS [facility])          Past Surgical History:   Procedure Laterality Date    HX CESAREAN SECTION      HX CHOLECYSTECTOMY      HX CYST REMOVAL Right     HX HYSTERECTOMY      TOTAL THYROIDECTOMY        Current Outpatient Medications   Medication Sig    cloNIDine HCL (CATAPRES) 0.1 mg Oral Tablet Take 1 Tablet (0.1 mg total) by mouth Once a day    ergocalciferol, vitamin D2, (DRISDOL) 1,250 mcg (50,000 unit) Oral Capsule Take 1 Capsule (50,000 Units total) by mouth Every 7 days    HYDROcodone-acetaminophen (NORCO) 5-325 mg Oral Tablet Take 2 Tablets by mouth Every 4 hours  as needed for Pain    Ibuprofen (MOTRIN) 600 mg Oral Tablet Take 1 Tablet (600 mg total) by mouth Every 6 hours as needed    levothyroxine (SYNTHROID) 112 mcg Oral Tablet Take 1 Tablet (112 mcg total) by mouth Every morning    lisinopriL (PRINIVIL) 40 mg Oral Tablet TAKE 1 TABLET BY MOUTH EVERY DAY (Patient taking differently: Take 1 Tablet (40 mg total) by mouth Once a day)    omeprazole (PRILOSEC) 40 mg Oral Capsule, Delayed Release(E.C.) Take 1 Capsule (40 mg total) by mouth Once a day    ondansetron (ZOFRAN ODT) 8 mg Oral Tablet, Rapid Dissolve Place 1 Tablet (8 mg total) under the tongue Every 8 hours as needed for Nausea/Vomiting     No Known Allergies    Family History:  Family Medical History:       Problem Relation (Age of Onset)    Cancer Maternal Aunt, Maternal Grandmother    Colon Cancer Maternal Uncle    Lung Cancer Mother    Melanoma Maternal Aunt  Social History:   Social History     Tobacco Use   Smoking Status Never   Smokeless Tobacco Never     Social History     Substance and Sexual Activity   Alcohol Use Never     Social History     Occupational History    Not on file       Review of Systems:  Review of systems as discussed in HPI    Problem List:  Patient Active Problem List   Diagnosis    Vitamin D deficiency    Hypothyroid    GERD (gastroesophageal reflux disease)    Primary hypertension    Neural foraminal stenosis of cervical spine    Osteophyte of cervical spine    S/P hysterectomy    Kidney cancer, primary, with metastasis from kidney to other site, left (CMS [facility])       Physical Examination:  BP 129/74 (Site: Left, Patient Position: Sitting, Cuff Size: Adult)   Pulse 71   Ht 1.753 m ('5\' 9"'$ )   Wt 95.5 kg (210 lb 9.6 oz)   SpO2 97%   BMI 31.10 kg/m       Physical Exam  Vitals and nursing note reviewed.   Constitutional:       General: She is not in acute distress.     Appearance: Normal appearance.   HENT:      Head: Normocephalic and atraumatic.   Cardiovascular:      Rate  and Rhythm: Normal rate.      Pulses: Normal pulses.      Heart sounds: Normal heart sounds, S1 normal and S2 normal.   Pulmonary:      Effort: Pulmonary effort is normal.      Breath sounds: Normal breath sounds.   Abdominal:      General: Abdomen is flat. Bowel sounds are normal.      Palpations: Abdomen is soft.   Skin:     General: Skin is warm and dry.      Capillary Refill: Capillary refill takes less than 2 seconds.   Neurological:      General: No focal deficit present.      Mental Status: She is alert and oriented to person, place, and time.        Data Reviewed:  Hospital Outpatient Visit on 02/07/2022   Component Date Value Ref Range Status    SODIUM 02/07/2022 139  136 - 145 mmol/L Final    POTASSIUM 02/07/2022 3.8  3.5 - 5.1 mmol/L Final    CHLORIDE 02/07/2022 103  98 - 107 mmol/L Final    CO2 TOTAL 02/07/2022 29  21 - 31 mmol/L Final    ANION GAP 02/07/2022 7  4 - 13 mmol/L Final    BUN 02/07/2022 10  7 - 25 mg/dL Final    CREATININE 02/07/2022 0.71  0.60 - 1.30 mg/dL Final    BUN/CREA RATIO 02/07/2022 14  6 - 22 Final    ESTIMATED GFR 02/07/2022 102  >59 mL/min/1.62m2 Final    ALBUMIN 02/07/2022 4.7  3.5 - 5.7 g/dL Final    CALCIUM 02/07/2022 10.0  8.6 - 10.3 mg/dL Final    GLUCOSE 02/07/2022 115 (H)  74 - 109 mg/dL Final    ALKALINE PHOSPHATASE 02/07/2022 81  34 - 104 U/L Final    ALT (SGPT) 02/07/2022 15  7 - 52 U/L Final    AST (SGOT) 02/07/2022 22  13 - 39 U/L Final    BILIRUBIN TOTAL  02/07/2022 1.0  0.3 - 1.2 mg/dL Final    PROTEIN TOTAL 02/07/2022 8.6  6.4 - 8.9 g/dL Final    ALBUMIN/GLOBULIN RATIO 02/07/2022 1.2  0.8 - 1.4 Final    OSMOLALITY, CALCULATED 02/07/2022 278  270 - 290 mOsm/kg Final    CALCIUM, CORRECTED 02/07/2022 9.3  8.9 - 10.8 mg/dL Final    GLOBULIN 02/07/2022 3.9  2.9 - 5.4 Final    MAGNESIUM 02/07/2022 1.8 (L)  1.9 - 2.7 mg/dL Final    TSH 02/07/2022 0.189 (L)  0.450 - 5.330 uIU/mL Final    WBC 02/07/2022 5.4  3.8 - 11.8 x10^3/uL Final    RBC 02/07/2022 5.61 (H)  3.63 - 4.92  x10^6/uL Final    HGB 02/07/2022 16.6 (H)  10.9 - 14.3 g/dL Final    HCT 02/07/2022 49.2 (H)  31.2 - 41.9 % Final    MCV 02/07/2022 87.7  75.5 - 95.3 fL Final    MCH 02/07/2022 29.7  24.7 - 32.8 pg Final    MCHC 02/07/2022 33.8  32.3 - 35.6 g/dL Final    RDW 02/07/2022 14.9  12.3 - 17.7 % Final    PLATELETS 02/07/2022 143  140 - 440 x10^3/uL Final    MPV 02/07/2022 8.9  7.9 - 10.8 fL Final    NEUTROPHIL % 02/07/2022 71  43 - 77 % Final    LYMPHOCYTE % 02/07/2022 17  16 - 44 % Final    MONOCYTE % 02/07/2022 7  5 - 13 % Final    EOSINOPHIL % 02/07/2022 5  % Final    BASOPHIL % 02/07/2022 1  0 - 1 % Final    NEUTROPHIL # 02/07/2022 3.90  1.85 - 7.80 x10^3/uL Final    LYMPHOCYTE # 02/07/2022 0.90 (L)  1.00 - 3.00 x10^3/uL Final    MONOCYTE # 02/07/2022 0.40  0.30 - 1.00 x10^3/uL Final    EOSINOPHIL # 02/07/2022 0.20  0.00 - 0.50 x10^3/uL Final    BASOPHIL # 02/07/2022 0.00  0.00 - 0.10 x10^3/uL Final    THYROXINE (T4), FREE 02/07/2022 0.88  0.58 - 1.64 ng/dL Final   Hospital Outpatient Visit on 01/17/2022   Component Date Value Ref Range Status    SODIUM 01/17/2022 138  136 - 145 mmol/L Final    POTASSIUM 01/17/2022 3.8  3.5 - 5.1 mmol/L Final    CHLORIDE 01/17/2022 104  98 - 107 mmol/L Final    CO2 TOTAL 01/17/2022 27  21 - 31 mmol/L Final    ANION GAP 01/17/2022 7  4 - 13 mmol/L Final    BUN 01/17/2022 8  7 - 25 mg/dL Final    CREATININE 01/17/2022 0.72  0.60 - 1.30 mg/dL Final    BUN/CREA RATIO 01/17/2022 11  6 - 22 Final    ESTIMATED GFR 01/17/2022 101  >59 mL/min/1.57m2 Final    ALBUMIN 01/17/2022 4.4  3.5 - 5.7 g/dL Final    CALCIUM 01/17/2022 9.9  8.6 - 10.3 mg/dL Final    GLUCOSE 01/17/2022 96  74 - 109 mg/dL Final    ALKALINE PHOSPHATASE 01/17/2022 78  34 - 104 U/L Final    ALT (SGPT) 01/17/2022 16  7 - 52 U/L Final    AST (SGOT) 01/17/2022 18  13 - 39 U/L Final    BILIRUBIN TOTAL 01/17/2022 1.1  0.3 - 1.2 mg/dL Final    PROTEIN TOTAL 01/17/2022 8.0  6.4 - 8.9 g/dL Final    ALBUMIN/GLOBULIN RATIO 01/17/2022  1.2  0.8 - 1.4 Final  OSMOLALITY, CALCULATED 01/17/2022 274  270 - 290 mOsm/kg Final    CALCIUM, CORRECTED 01/17/2022 9.5  8.9 - 10.8 mg/dL Final    GLOBULIN 01/17/2022 3.6  2.9 - 5.4 Final    MAGNESIUM 01/17/2022 2.0  1.9 - 2.7 mg/dL Final    TSH 01/17/2022 0.069 (L)  0.450 - 5.330 uIU/mL Final    WBC 01/17/2022 5.5  3.8 - 11.8 x10^3/uL Final    RBC 01/17/2022 5.35 (H)  3.63 - 4.92 x10^6/uL Final    HGB 01/17/2022 15.8 (H)  10.9 - 14.3 g/dL Final    HCT 01/17/2022 46.4 (H)  31.2 - 41.9 % Final    MCV 01/17/2022 86.7  75.5 - 95.3 fL Final    MCH 01/17/2022 29.6  24.7 - 32.8 pg Final    MCHC 01/17/2022 34.1  32.3 - 35.6 g/dL Final    RDW 01/17/2022 15.6  12.3 - 17.7 % Final    PLATELETS 01/17/2022 168  140 - 440 x10^3/uL Final    MPV 01/17/2022 8.9  7.9 - 10.8 fL Final    NEUTROPHIL % 01/17/2022 72  43 - 77 % Final    LYMPHOCYTE % 01/17/2022 16  16 - 44 % Final    MONOCYTE % 01/17/2022 9  5 - 13 % Final    EOSINOPHIL % 01/17/2022 3  % Final    BASOPHIL % 01/17/2022 1  0 - 1 % Final    NEUTROPHIL # 01/17/2022 3.90  1.85 - 7.80 x10^3/uL Final    LYMPHOCYTE # 01/17/2022 0.90 (L)  1.00 - 3.00 x10^3/uL Final    MONOCYTE # 01/17/2022 0.50  0.30 - 1.00 x10^3/uL Final    EOSINOPHIL # 01/17/2022 0.10  0.00 - 0.50 x10^3/uL Final    BASOPHIL # 01/17/2022 0.00  0.00 - 0.10 x10^3/uL Final    THYROXINE (T4), FREE 01/17/2022 1.54  0.58 - 1.64 ng/dL Final   Clinical Support on 01/16/2022   Component Date Value Ref Range Status    TSH 01/16/2022 0.059 (L)  0.450 - 5.330 uIU/mL Final   Admission on 01/06/2022, Discharged on 01/07/2022   Component Date Value Ref Range Status    Final Diagnosis 01/06/2022    Final                    Value:This result contains rich text formatting which cannot be displayed here.    Diagnosis Comment 01/06/2022    Final                    Value:This result contains rich text formatting which cannot be displayed here.    Clinical History 01/06/2022    Final                    Value:This result contains  rich text formatting which cannot be displayed here.    Gross Description 01/06/2022    Final                    Value:This result contains rich text formatting which cannot be displayed here.    WBC 01/07/2022 11.1  3.8 - 11.8 x10^3/uL Final    RBC 01/07/2022 5.23 (H)  3.63 - 4.92 x10^6/uL Final    HGB 01/07/2022 15.3 (H)  10.9 - 14.3 g/dL Final    HCT 01/07/2022 46.0 (H)  31.2 - 41.9 % Final    MCV 01/07/2022 88.0  75.5 - 95.3 fL Final    MCH 01/07/2022 29.2  24.7 -  32.8 pg Final    MCHC 01/07/2022 33.2  32.3 - 35.6 g/dL Final    RDW 01/07/2022 15.5  12.3 - 17.7 % Final    PLATELETS 01/07/2022 143  140 - 440 x10^3/uL Final    MPV 01/07/2022 9.5  7.9 - 10.8 fL Final    NEUTROPHIL % 01/07/2022 89 (H)  43 - 77 % Final    LYMPHOCYTE % 01/07/2022 5 (L)  16 - 44 % Final    MONOCYTE % 01/07/2022 6  5 - 13 % Final    EOSINOPHIL % 01/07/2022 0 (L)  % Final    BASOPHIL % 01/07/2022 0  0 - 1 % Final    NEUTROPHIL # 01/07/2022 9.90 (H)  1.85 - 7.80 x10^3/uL Final    LYMPHOCYTE # 01/07/2022 0.60 (L)  1.00 - 3.00 x10^3/uL Final    MONOCYTE # 01/07/2022 0.60  0.30 - 1.00 x10^3/uL Final    EOSINOPHIL # 01/07/2022 0.00  0.00 - 0.50 x10^3/uL Final    BASOPHIL # 01/07/2022 0.00  0.00 - 0.10 x10^3/uL Final    RAINBOW/EXTRA TUBE AUTO RESULT 01/07/2022 Yes   Final   Appointment on 01/02/2022   Component Date Value Ref Range Status    SODIUM 01/02/2022 141  136 - 145 mmol/L Final    POTASSIUM 01/02/2022 3.7  3.5 - 5.1 mmol/L Final    CHLORIDE 01/02/2022 105  98 - 107 mmol/L Final    CO2 TOTAL 01/02/2022 30  21 - 31 mmol/L Final    ANION GAP 01/02/2022 6  4 - 13 mmol/L Final    CALCIUM 01/02/2022 10.1  8.6 - 10.3 mg/dL Final    GLUCOSE 01/02/2022 88  74 - 109 mg/dL Final    BUN 01/02/2022 11  7 - 25 mg/dL Final    CREATININE 01/02/2022 0.79  0.60 - 1.30 mg/dL Final    BUN/CREA RATIO 01/02/2022 14  6 - 22 Final    ESTIMATED GFR 01/02/2022 90  >59 mL/min/1.60m2 Final    OSMOLALITY, CALCULATED 01/02/2022 280  270 - 290 mOsm/kg Final     THYROXINE (T4), FREE 01/02/2022 1.28  0.58 - 1.64 ng/dL Final    TSH 01/02/2022 0.050 (L)  0.450 - 5.330 uIU/mL Final    WBC 01/02/2022 4.9  3.8 - 11.8 x10^3/uL Final    RBC 01/02/2022 5.59 (H)  3.63 - 4.92 x10^6/uL Final    HGB 01/02/2022 16.4 (H)  10.9 - 14.3 g/dL Final    HCT 01/02/2022 49.0 (H)  31.2 - 41.9 % Final    MCV 01/02/2022 87.6  75.5 - 95.3 fL Final    MCH 01/02/2022 29.3  24.7 - 32.8 pg Final    MCHC 01/02/2022 33.5  32.3 - 35.6 g/dL Final    RDW 01/02/2022 15.6  12.3 - 17.7 % Final    PLATELETS 01/02/2022 144  140 - 440 x10^3/uL Final    MPV 01/02/2022 9.9  7.9 - 10.8 fL Final    NEUTROPHIL % 01/02/2022 69  43 - 77 % Final    LYMPHOCYTE % 01/02/2022 20  16 - 44 % Final    MONOCYTE % 01/02/2022 9  5 - 13 % Final    EOSINOPHIL % 01/02/2022 2  % Final    BASOPHIL % 01/02/2022 1  0 - 1 % Final    NEUTROPHIL # 01/02/2022 3.40  1.85 - 7.80 x10^3/uL Final    LYMPHOCYTE # 01/02/2022 1.00  1.00 - 3.00 x10^3/uL Final    MONOCYTE # 01/02/2022 0.40  0.30 - 1.00 x10^3/uL Final    EOSINOPHIL # 01/02/2022 0.10  0.00 - 0.50 x10^3/uL Final    BASOPHIL # 01/02/2022 0.00  0.00 - 0.10 x10^3/uL Final   Lab Requisition on 12/02/2021   Component Date Value Ref Range Status    Final Diagnosis 12/02/2021    Final                    Value:This result contains rich text formatting which cannot be displayed here.    Clinical History 12/02/2021    Final                    Value:This result contains rich text formatting which cannot be displayed here.    Gross Description 12/02/2021    Final                    Value:This result contains rich text formatting which cannot be displayed here.   Admission on 10/21/2021, Discharged on 10/22/2021   Component Date Value Ref Range Status    SODIUM 10/22/2021 141  136 - 145 mmol/L Final    POTASSIUM 10/22/2021 3.7  3.5 - 5.1 mmol/L Final    CHLORIDE 10/22/2021 104  98 - 107 mmol/L Final    CO2 TOTAL 10/22/2021 28  21 - 32 mmol/L Final    ANION GAP 10/22/2021 9 (L)  10 - 20 mmol/L Final     BUN 10/22/2021 10  7 - 18 mg/dL Final    CREATININE 10/22/2021 0.83  0.55 - 1.02 mg/dL Final    BUN/CREA RATIO 10/22/2021 12   Final    ESTIMATED GFR 10/22/2021 85  >59 mL/min/1.36m2 Final    ALBUMIN 10/22/2021 3.8  3.4 - 5.0 g/dL Final    CALCIUM 10/22/2021 9.1  8.5 - 10.1 mg/dL Final    GLUCOSE 10/22/2021 121 (H)  74 - 106 mg/dL Final    ALKALINE PHOSPHATASE 10/22/2021 85  46 - 116 U/L Final    ALT (SGPT) 10/22/2021 26  <=78 U/L Final    AST (SGOT) 10/22/2021 23  15 - 37 U/L Final    BILIRUBIN TOTAL 10/22/2021 0.9  0.2 - 1.0 mg/dL Final    PROTEIN TOTAL 10/22/2021 8.2  6.4 - 8.2 g/dL Final    ALBUMIN/GLOBULIN RATIO 10/22/2021 0.9  0.8 - 1.4 Final    OSMOLALITY, CALCULATED 10/22/2021 282  270 - 290 mOsm/kg Final    CALCIUM, CORRECTED 10/22/2021 9.3  mg/dL Final    GLOBULIN 10/22/2021 4.4   Final    WBC 10/22/2021 5.5  4.0 - 10.5 x10^3/uL Final    RBC 10/22/2021 6.08 (H)  4.20 - 5.40 x10^6/uL Final    HGB 10/22/2021 17.4 (H)  12.5 - 16.0 g/dL Final    HCT 10/22/2021 54.4 (H)  37.0 - 47.0 % Final    MCV 10/22/2021 89.5  78.0 - 99.0 fL Final    MCH 10/22/2021 28.7  27.0 - 32.0 pg Final    MCHC 10/22/2021 32.0  32.0 - 36.0 g/dL Final    RDW 10/22/2021 16.0 (H)  11.6 - 14.8 % Final    PLATELETS 10/22/2021 144  140 - 440 x10^3/uL Final    MPV 10/22/2021 10.0  7.4 - 10.4 fL Final    NEUTROPHIL % 10/22/2021 63  40 - 76 % Final    LYMPHOCYTE % 10/22/2021 26  25 - 45 % Final    MONOCYTE % 10/22/2021 9  0 - 12 % Final  EOSINOPHIL % 10/22/2021 3  0 - 7 % Final    BASOPHIL % 10/22/2021 0  0 - 3 % Final    NEUTROPHIL # 10/22/2021 3.45  1.80 - 8.40 x10^3/uL Final    LYMPHOCYTE # 10/22/2021 1.41  1.10 - 5.00 x10^3/uL Final    MONOCYTE # 10/22/2021 0.48  0.00 - 1.30 x10^3/uL Final    EOSINOPHIL # 10/22/2021 0.15  0.00 - 0.80 x10^3/uL Final    BASOPHIL # 10/22/2021 0.01  0.00 - 0.30 x10^3/uL Final    COLOR 10/22/2021 Yellow  Yellow Final    APPEARANCE 10/22/2021 Clear  Clear Final    SPECIFIC GRAVITY 10/22/2021 <=1.005  1.003 -  1.035 Final    PH 10/22/2021 6.0  4.6 - 8.0 Final    LEUKOCYTES 10/22/2021 Negative  Negative WBCs/uL Final    NITRITE 10/22/2021 Negative  Negative Final    PROTEIN 10/22/2021 Negative  Negative mg/dL Final    GLUCOSE 10/22/2021 Negative  Negative mg/dL Final    KETONES 10/22/2021 Negative  Negative mg/dL Final    BILIRUBIN 10/22/2021 Negative  Negative mg/dL Final    BLOOD 10/22/2021 Trace (A)  Negative mg/dL Final    UROBILINOGEN 10/22/2021 0.2  0.2 - 1.0 mg/dL Final    TSH 10/22/2021 34.384 (H)  0.358 - 3.740 uIU/mL Final    RBCS 10/22/2021 0-3  0-3, Not Present /hpf Final    BACTERIA 10/22/2021 Rare (A)  Negative /hpf Final    SQUAMOUS EPITHELIAL 10/22/2021 Few  Not Present, Few /hpf Final   Lab Requisition on 08/29/2021   Component Date Value Ref Range Status    Final Diagnosis 08/29/2021    Final                    Value:This result contains rich text formatting which cannot be displayed here.    Clinical History 08/29/2021    Final                    Value:This result contains rich text formatting which cannot be displayed here.    Gross Description 08/29/2021    Final                    Value:This result contains rich text formatting which cannot be displayed here.        Health Maintenance:  Health Maintenance   Topic Date Due    Hepatitis B Vaccine (1 of 3 - 3-dose series) Never done    Pneumococcal Vaccine, Age 64-64 (1 - PCV) Never done    HIV Screening  Never done    Adult Tdap-Td (1 - Tdap) Never done    Shingles Vaccine (1 of 2) Never done    Pap smear  Never done    Colonoscopy  Never done    Mammography  Never done    Influenza Vaccine (1) Never done    Covid-19 Vaccine (4 - 2023-24 season) 12/20/2021    Depression Screening  11/08/2022    Meningococcal Vaccine  Aged Out        Assessment & Plan   Problem List Items Addressed This Visit          Cardiovascular System    Primary hypertension     Condition stable will continue current therapy with lisinopril.               Digestive    GERD  (gastroesophageal reflux disease) (Chronic)     Condition stable will continue current  therapy with Prilosec.               Endocrine    Hypothyroid - Primary (Chronic)     Synthroid decreased. Order given to patient to recheck TSH in appx 4-6 weeks.          Relevant Medications    levothyroxine (SYNTHROID) 112 mcg Oral Tablet    Other Relevant Orders    THYROID STIMULATING HORMONE (SENSITIVE TSH)    Vitamin D deficiency     Condition stable will continue current therapy with vitamin d replacement.                Chronic disease management follow up. Patient is taking and tolerating all medications well without complaint of negative side effects.   Past medical history/medications/labs/ and current plan of care summarized and discussed in detail with patient during visit.   Return to clinic with new or worsening symptoms.          Follow up:  Return in about 4 months (around 06/21/2022), or if symptoms worsen or fail to improve.    This note was partially created using voice recognition software and is inherently subject to errors including those of syntax and "sound alike " substitutions which may escape proof reading.  In such instances, original meaning may be extrapolated by contextual derivation.    Eleanora Neighbor, FNP-C  02/20/2022, 08:59

## 2022-02-20 NOTE — Assessment & Plan Note (Signed)
Condition stable will continue current therapy with lisinopril.

## 2022-02-20 NOTE — Assessment & Plan Note (Signed)
Synthroid decreased. Order given to patient to recheck TSH in appx 4-6 weeks.

## 2022-02-20 NOTE — Assessment & Plan Note (Signed)
Condition stable will continue current therapy with vitamin d replacement.

## 2022-02-20 NOTE — Assessment & Plan Note (Signed)
Condition stable will continue current therapy with Prilosec.

## 2022-02-27 ENCOUNTER — Other Ambulatory Visit (HOSPITAL_COMMUNITY): Payer: Self-pay | Admitting: HEMATOLOGY-ONCOLOGY

## 2022-02-28 ENCOUNTER — Inpatient Hospital Stay
Admission: RE | Admit: 2022-02-28 | Discharge: 2022-02-28 | Disposition: A | Discharge status: Still In Hospital | Payer: 59 | Source: Ambulatory Visit | Attending: HEMATOLOGY-ONCOLOGY | Admitting: HEMATOLOGY-ONCOLOGY

## 2022-02-28 ENCOUNTER — Ambulatory Visit: Payer: 59 | Attending: HEMATOLOGY-ONCOLOGY | Admitting: HEMATOLOGY-ONCOLOGY

## 2022-02-28 ENCOUNTER — Inpatient Hospital Stay (HOSPITAL_COMMUNITY)
Admission: RE | Admit: 2022-02-28 | Discharge: 2022-02-28 | Disposition: A | Discharge status: Still In Hospital | Payer: 59 | Source: Ambulatory Visit | Attending: HEMATOLOGY-ONCOLOGY | Admitting: HEMATOLOGY-ONCOLOGY

## 2022-02-28 ENCOUNTER — Encounter (HOSPITAL_COMMUNITY): Payer: Self-pay | Admitting: HEMATOLOGY-ONCOLOGY

## 2022-02-28 ENCOUNTER — Encounter (HOSPITAL_COMMUNITY): Payer: Self-pay

## 2022-02-28 ENCOUNTER — Other Ambulatory Visit: Payer: Self-pay

## 2022-02-28 VITALS — BP 150/67 | HR 79 | Temp 97.7°F | Ht 69.0 in | Wt 207.6 lb

## 2022-02-28 VITALS — BP 134/84 | HR 82 | Temp 97.6°F | Resp 16

## 2022-02-28 DIAGNOSIS — Z23 Encounter for immunization: Secondary | ICD-10-CM | POA: Insufficient documentation

## 2022-02-28 DIAGNOSIS — C7800 Secondary malignant neoplasm of unspecified lung: Secondary | ICD-10-CM | POA: Insufficient documentation

## 2022-02-28 DIAGNOSIS — Z801 Family history of malignant neoplasm of trachea, bronchus and lung: Secondary | ICD-10-CM | POA: Insufficient documentation

## 2022-02-28 DIAGNOSIS — C642 Malignant neoplasm of left kidney, except renal pelvis: Secondary | ICD-10-CM | POA: Insufficient documentation

## 2022-02-28 DIAGNOSIS — Z800 Family history of malignant neoplasm of digestive organs: Secondary | ICD-10-CM | POA: Insufficient documentation

## 2022-02-28 DIAGNOSIS — Z5112 Encounter for antineoplastic immunotherapy: Secondary | ICD-10-CM | POA: Insufficient documentation

## 2022-02-28 DIAGNOSIS — Z809 Family history of malignant neoplasm, unspecified: Secondary | ICD-10-CM | POA: Insufficient documentation

## 2022-02-28 DIAGNOSIS — C799 Secondary malignant neoplasm of unspecified site: Secondary | ICD-10-CM | POA: Insufficient documentation

## 2022-02-28 DIAGNOSIS — Z808 Family history of malignant neoplasm of other organs or systems: Secondary | ICD-10-CM | POA: Insufficient documentation

## 2022-02-28 LAB — CBC WITH DIFF
BASOPHIL #: 0 10*3/uL (ref 0.00–0.10)
BASOPHIL %: 1 % (ref 0–1)
EOSINOPHIL #: 0.2 10*3/uL (ref 0.00–0.50)
EOSINOPHIL %: 4 %
HCT: 47.5 % — ABNORMAL HIGH (ref 31.2–41.9)
HGB: 16.2 g/dL — ABNORMAL HIGH (ref 10.9–14.3)
LYMPHOCYTE #: 0.9 10*3/uL — ABNORMAL LOW (ref 1.00–3.00)
LYMPHOCYTE %: 19 % (ref 16–44)
MCH: 29.4 pg (ref 24.7–32.8)
MCHC: 34 g/dL (ref 32.3–35.6)
MCV: 86.5 fL (ref 75.5–95.3)
MONOCYTE #: 0.5 10*3/uL (ref 0.30–1.00)
MONOCYTE %: 9 % (ref 5–13)
MPV: 9.1 fL (ref 7.9–10.8)
NEUTROPHIL #: 3.4 10*3/uL (ref 1.85–7.80)
NEUTROPHIL %: 67 % (ref 43–77)
PLATELETS: 149 10*3/uL (ref 140–440)
RBC: 5.49 10*6/uL — ABNORMAL HIGH (ref 3.63–4.92)
RDW: 14.9 % (ref 12.3–17.7)
WBC: 5 10*3/uL (ref 3.8–11.8)

## 2022-02-28 LAB — THYROXINE, FREE (FREE T4): THYROXINE (T4), FREE: 0.9 ng/dL (ref 0.58–1.64)

## 2022-02-28 LAB — COMPREHENSIVE METABOLIC PANEL, NON-FASTING
ALBUMIN/GLOBULIN RATIO: 1.2 (ref 0.8–1.4)
ALBUMIN: 4.4 g/dL (ref 3.5–5.7)
ALKALINE PHOSPHATASE: 88 U/L (ref 34–104)
ALT (SGPT): 41 U/L (ref 7–52)
ANION GAP: 8 mmol/L (ref 4–13)
AST (SGOT): 42 U/L — ABNORMAL HIGH (ref 13–39)
BILIRUBIN TOTAL: 1.2 mg/dL (ref 0.3–1.2)
BUN/CREA RATIO: 12 (ref 6–22)
BUN: 8 mg/dL (ref 7–25)
CALCIUM, CORRECTED: 9.4 mg/dL (ref 8.9–10.8)
CALCIUM: 9.7 mg/dL (ref 8.6–10.3)
CHLORIDE: 106 mmol/L (ref 98–107)
CO2 TOTAL: 26 mmol/L (ref 21–31)
CREATININE: 0.67 mg/dL (ref 0.60–1.30)
ESTIMATED GFR: 105 mL/min/{1.73_m2} (ref >59)
GLOBULIN: 3.8 (ref 2.9–5.4)
GLUCOSE: 129 mg/dL — ABNORMAL HIGH (ref 74–109)
OSMOLALITY, CALCULATED: 280 mOsm/kg (ref 270–290)
POTASSIUM: 3.8 mmol/L (ref 3.5–5.1)
PROTEIN TOTAL: 8.2 g/dL (ref 6.4–8.9)
SODIUM: 140 mmol/L (ref 136–145)

## 2022-02-28 LAB — THYROID STIMULATING HORMONE WITH FREE T4 REFLEX: TSH: 0.238 u[IU]/mL — ABNORMAL LOW (ref 0.450–5.330)

## 2022-02-28 LAB — MAGNESIUM: MAGNESIUM: 1.9 mg/dL (ref 1.9–2.7)

## 2022-02-28 MED ORDER — BENZONATATE 200 MG CAPSULE
200.0000 mg | ORAL_CAPSULE | Freq: Three times a day (TID) | ORAL | 0 refills | Status: DC
Start: 2022-02-28 — End: 2022-06-13

## 2022-02-28 MED ORDER — DIPHENHYDRAMINE 50 MG/ML INJECTION SOLUTION
INTRAMUSCULAR | Status: AC
Start: 2022-02-28 — End: 2022-02-28
  Filled 2022-02-28: qty 1

## 2022-02-28 MED ORDER — PROCHLORPERAZINE MALEATE 10 MG TABLET
10.0000 mg | ORAL_TABLET | Freq: Once | ORAL | Status: DC | PRN
Start: 2022-02-28 — End: 2022-03-01

## 2022-02-28 MED ORDER — ALBUTEROL SULFATE HFA 90 MCG/ACTUATION AEROSOL INHALER - RN
2.0000 | Freq: Once | RESPIRATORY_TRACT | Status: DC | PRN
Start: 2022-02-28 — End: 2022-03-01

## 2022-02-28 MED ORDER — MEPERIDINE (PF) 25 MG/ML INJECTION SOLUTION
12.5000 mg | Freq: Once | INTRAMUSCULAR | Status: DC | PRN
Start: 2022-02-28 — End: 2022-03-01

## 2022-02-28 MED ORDER — EPINEPHRINE HCL (PF) 1 MG/ML (1 ML) INJECTION SOLUTION
0.3000 mg | Freq: Once | INTRAMUSCULAR | Status: DC | PRN
Start: 2022-02-28 — End: 2022-03-01

## 2022-02-28 MED ORDER — DIPHENHYDRAMINE 50 MG/ML INJECTION SOLUTION
50.0000 mg | Freq: Once | INTRAMUSCULAR | Status: DC | PRN
Start: 2022-02-28 — End: 2022-03-01

## 2022-02-28 MED ORDER — FAMOTIDINE (PF) 20 MG/2 ML INTRAVENOUS SOLUTION
INTRAVENOUS | Status: AC
Start: 2022-02-28 — End: 2022-02-28
  Filled 2022-02-28: qty 2

## 2022-02-28 MED ORDER — DEXTROSE 5% IN WATER (D5W) FLUSH BAG - 250 ML
INTRAVENOUS | Status: DC | PRN
Start: 2022-02-28 — End: 2022-03-01

## 2022-02-28 MED ORDER — SODIUM CHLORIDE 0.9 % INTRAVENOUS SOLUTION
3.0000 mg/kg | Freq: Once | INTRAVENOUS | Status: AC
Start: 2022-02-28 — End: 2022-02-28
  Administered 2022-02-28: 0 mg via INTRAVENOUS
  Administered 2022-02-28: 283 mg via INTRAVENOUS
  Filled 2022-02-28: qty 10

## 2022-02-28 MED ORDER — SODIUM CHLORIDE 0.9 % INTRAVENOUS SOLUTION
1.0000 mg/kg | Freq: Once | INTRAVENOUS | Status: AC
Start: 2022-02-28 — End: 2022-02-28
  Administered 2022-02-28: 94 mg via INTRAVENOUS
  Administered 2022-02-28: 0 mg via INTRAVENOUS
  Filled 2022-02-28: qty 18.8

## 2022-02-28 MED ORDER — PROCHLORPERAZINE EDISYLATE 10 MG/2 ML (5 MG/ML) INJECTION SOLUTION
10.0000 mg | Freq: Once | INTRAMUSCULAR | Status: DC | PRN
Start: 2022-02-28 — End: 2022-03-01

## 2022-02-28 MED ORDER — FAMOTIDINE (PF) 20 MG/2 ML INTRAVENOUS SOLUTION
20.0000 mg | Freq: Once | INTRAVENOUS | Status: DC | PRN
Start: 2022-02-28 — End: 2022-03-01
  Administered 2022-02-28: 20 mg via INTRAVENOUS

## 2022-02-28 MED ORDER — FLU VACCINE QS 2023-24(6MOS UP)(PF) 60 MCG(15 MCGX4)/0.5 ML IM SYRINGE
0.5000 mL | INJECTION | Freq: Once | INTRAMUSCULAR | Status: AC
Start: 2022-02-28 — End: 2022-02-28
  Administered 2022-02-28: 0.5 mL via INTRAMUSCULAR
  Filled 2022-02-28: qty 0.5

## 2022-02-28 MED ORDER — ALBUTEROL SULFATE 2.5 MG/3 ML (0.083 %) SOLUTION FOR NEBULIZATION
2.5000 mg | INHALATION_SOLUTION | Freq: Once | RESPIRATORY_TRACT | Status: DC | PRN
Start: 2022-02-28 — End: 2022-03-01

## 2022-02-28 MED ORDER — HYDROCORTISONE SOD SUCCINATE 100 MG/2 ML VIAL WRAPPER
100.0000 mg | Freq: Once | INTRAMUSCULAR | Status: DC | PRN
Start: 2022-02-28 — End: 2022-03-01
  Administered 2022-02-28: 100 mg via INTRAVENOUS

## 2022-02-28 MED ORDER — SODIUM CHLORIDE 0.9% FLUSH BAG - 250 ML
INTRAVENOUS | Status: DC | PRN
Start: 2022-02-28 — End: 2022-03-01

## 2022-02-28 MED ORDER — HYDROCORTISONE SOD SUCCINATE 100 MG/2 ML VIAL WRAPPER
INTRAMUSCULAR | Status: AC
Start: 2022-02-28 — End: 2022-02-28
  Filled 2022-02-28: qty 2

## 2022-02-28 MED ORDER — DIPHENHYDRAMINE 50 MG/ML INJECTION SOLUTION
25.0000 mg | Freq: Once | INTRAMUSCULAR | Status: DC | PRN
Start: 2022-02-28 — End: 2022-03-01
  Administered 2022-02-28: 25 mg via INTRAVENOUS

## 2022-02-28 NOTE — Progress Notes (Unsigned)
Department of Hematology/Oncology  History and Physical    Name: Taylor Smith  ZMO:[id number]  Date of Birth: 20-Nov-1970  Encounter Date: 02/28/2022    REFERRING PROVIDER:  No referring provider defined for this encounter.    TELEMEDICINE DOCUMENTATION:  Patient Location:  [facility], [facility] outpatient Hematology/Oncology [address] [place] [address]  Patient/family aware of provider location:  yes  Patient/family consent for telemedicine:  yes    REASON FOR OFFICE VISIT:  New patient for evaluation and management of stage IV clear cell renal carcinoma    HISTORY OF PRESENT ILLNESS:A  Taylor Smith is a 51 y.o. 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  She was originally diagnosed after having a biopsy in the genitourinary area.  It came back consistent with clear cell renal cell carcinoma, which prompted an imaging study of the chest, abdomen, and pelvis.  She was found to have a massive left kidney mass with numerous small pulmonary nodules.  The renal mass appears to be involving the renal vein but not the inferior vena cava.    Prior to this, she was not having any symptoms.  Specifically, she did not have any hematuria or history of iron-deficiency anemia.  She does report back pain which has been going on for the past year but it is unclear whether that was related or not.    02/07/2022: The patient is here for follow up of stage IV kidney cancer.  She states that she tolerated the 1st treatment well.  She did have some achiness of the legs which was pretty brief in nature.    02/28/2022: The patient is here for follow up of stage IV kidney cancer.  She states that she has some pain in her legs for a few days following treatment.  She also reports that she is had a cough which has been present since her most recent surgery.  Today will be her 3rd cycle of treatment, and she has a CT scan scheduled for  December 20.    ROS:   Review of Systems   Constitutional:  Negative for appetite change, chills and fatigue.   HENT:   Negative for sore throat and trouble swallowing.    Eyes:  Negative for eye problems.   Respiratory:  Negative for cough and shortness of breath.    Cardiovascular:  Negative for chest pain and leg swelling.   Gastrointestinal:  Negative for abdominal pain.   Genitourinary:  Negative for dysuria and hematuria.    Musculoskeletal:  Negative for arthralgias and gait problem.   Skin:  Negative for rash.   Neurological:  Negative for gait problem.   Hematological:  Negative for adenopathy.   Psychiatric/Behavioral:  Negative for depression.         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)     Kidney cancer, primary, with metastasis from kidney to other site, left (CMS [facility])          Past Surgical History:   Procedure Laterality Date    HX CESAREAN SECTION      HX CHOLECYSTECTOMY      HX CYST REMOVAL Right     HX HYSTERECTOMY      TOTAL THYROIDECTOMY  Social History     Socioeconomic History    Marital status: Married     Spouse name: Not on file    Number of children: Not on file    Years of education: Not on file    Highest education level: Not on file   Occupational History    Not on file   Tobacco Use    Smoking status: Never    Smokeless tobacco: Never   Vaping Use    Vaping Use: Never used   Substance and Sexual Activity    Alcohol use: Never    Drug use: Never    Sexual activity: Yes   Other Topics Concern    Not on file   Social History Narrative    Not on file     Social Determinants of Health     Financial Resource Strain: Not on file   Transportation Needs: Not on file   Social Connections: Not on file   Intimate Partner Violence: High Risk (01/13/2022)    Intimate Partner Violence     SDOH Domestic Violence: No    Housing Stability: Not on file     Family Medical History:       Problem Relation (Age of Onset)    Cancer Maternal Aunt, Maternal Grandmother    Colon Cancer Maternal Uncle    Lung Cancer Mother    Melanoma Maternal Aunt            Current Outpatient Medications   Medication Sig    Benzonatate (TESSALON) 200 mg Oral Capsule Take 1 Capsule (200 mg total) by mouth Three times a day    cloNIDine HCL (CATAPRES) 0.1 mg Oral Tablet Take 1 Tablet (0.1 mg total) by mouth Once a day    ergocalciferol, vitamin D2, (DRISDOL) 1,250 mcg (50,000 unit) Oral Capsule Take 1 Capsule (50,000 Units total) by mouth Every 7 days    HYDROcodone-acetaminophen (NORCO) 5-325 mg Oral Tablet Take 2 Tablets by mouth Every 4 hours as needed for Pain    Ibuprofen (MOTRIN) 600 mg Oral Tablet Take 1 Tablet (600 mg total) by mouth Every 6 hours as needed    levothyroxine (SYNTHROID) 112 mcg Oral Tablet Take 1 Tablet (112 mcg total) by mouth Every morning    lisinopriL (PRINIVIL) 40 mg Oral Tablet TAKE 1 TABLET BY MOUTH EVERY DAY (Patient taking differently: Take 1 Tablet (40 mg total) by mouth Once a day)    omeprazole (PRILOSEC) 40 mg Oral Capsule, Delayed Release(E.C.) Take 1 Capsule (40 mg total) by mouth Once a day    ondansetron (ZOFRAN ODT) 8 mg Oral Tablet, Rapid Dissolve Place 1 Tablet (8 mg total) under the tongue Every 8 hours as needed for Nausea/Vomiting     No Known Allergies    PHYSICAL EXAM:  Most Recent Vitals    Flowsheet Row Telemedicine from 01/13/2022 in Hematology/Oncology,   [facility]   Temperature 36.7 C (98.1 F) filed at... 01/13/2022 1431   Heart Rate 72 filed at... 01/13/2022 1431   Respiratory Rate --   BP (Non-Invasive) 146/73 filed at... 01/13/2022 1431   SpO2 97 % filed at... 01/13/2022 1431   Height 1.753 m ('5\' 9"'$ ) filed at... 01/13/2022 1431   Weight 94.2 kg (207 lb 9.6 oz) filed at... 01/13/2022 1431   BMI (Calculated) 30.72 filed at... 01/13/2022 1431   BSA (Calculated) 2.14 filed at...  01/13/2022 1431      ECOG Status: (0) Fully active, able to carry on all predisease performance  without restriction   Physical Exam  Constitutional:       General: She is not in acute distress.     Appearance: Normal appearance.   Eyes:      Extraocular Movements: Extraocular movements intact.   Cardiovascular:      Rate and Rhythm: Normal rate and regular rhythm.   Pulmonary:      Effort: Pulmonary effort is normal.   Abdominal:      General: Abdomen is flat.      Palpations: Abdomen is soft.   Musculoskeletal:         General: Normal range of motion.      Cervical back: Normal range of motion.   Skin:     General: Skin is warm and dry.   Neurological:      General: No focal deficit present.      Mental Status: She is alert.   Psychiatric:         Mood and Affect: Mood normal.         DIAGNOSTIC DATA:  No results found for this or any previous visit (from the past 17520 hour(s)).    LABS:   CBC  Diff   Lab Results   Component Value Date/Time    WBC 5.0 02/28/2022 07:35 AM    HGB 16.2 (H) 02/28/2022 07:35 AM    HCT 47.5 (H) 02/28/2022 07:35 AM    PLTCNT 149 02/28/2022 07:35 AM    RBC 5.49 (H) 02/28/2022 07:35 AM    MCV 86.5 02/28/2022 07:35 AM    MCHC 34.0 02/28/2022 07:35 AM    MCH 29.4 02/28/2022 07:35 AM    RDW 14.9 02/28/2022 07:35 AM    MPV 9.1 02/28/2022 07:35 AM    Lab Results   Component Value Date/Time    PMNS 67 02/28/2022 07:35 AM    LYMPHOCYTES 19 02/28/2022 07:35 AM    EOSINOPHIL 4 02/28/2022 07:35 AM    MONOCYTES 9 02/28/2022 07:35 AM    BASOPHILS 1 02/28/2022 07:35 AM    BASOPHILS 0.00 02/28/2022 07:35 AM    PMNABS 3.40 02/28/2022 07:35 AM    LYMPHSABS 0.90 (L) 02/28/2022 07:35 AM    EOSABS 0.20 02/28/2022 07:35 AM    MONOSABS 0.50 02/28/2022 07:35 AM            ASSESSMENT:    ICD-10-CM    1. Kidney cancer, primary, with metastasis from kidney to other site, left (CMS HCC)  C64.2            PLAN:   1. All relevant medical records were reviewed including available pertinent provider notes, procedure  notes, imaging, laboratory, and pathology.   2. All pertinent labs and/or imaging were reviewed with the patient.   3. Stage IV renal carcinoma:  The patient has a massive left kidney cancer and numerous metastatic pulmonary nodules, most of which are small.  She also did have the random lesion which had occurred on the vaginal wall which was the presenting symptom for the diagnosis.  We will continue with combination immunotherapy for 4 cycles, followed by nivolumab maintenance.  She states that she will also follow up with the physician at [facility] surgical oncology for consideration of debulking surgery if she has a good response.  I will tentatively plan on seeing her back in 3 weeks for the next cycle of treatment.    Taylor Smith was given the chance to ask questions, and these were answered to their satisfaction. The patient is welcome  to call with any questions or concerns in the meantime.     On the day of the encounter, a total of 35 minutes was spent on this patient encounter including review of historical information, examination, documentation and post-visit activities.   No follow-ups on file.     Narda Rutherford, MD  02/28/2022, 08:13  The patient was seen as part of a collaborative telemedicine service with Dr. Jake Shark who participated in the encounter by active presence via approved video/audio means for portions of the encounter.  The patient's insurance company bears full legal and financial responsibility resulting from any deviations that they cause to my recommended treatment plan.   CC:  Eleanora Neighbor, FNP-C  [place] [address]  [place] [address]    No referring provider defined for this encounter.    This note was partially generated using MModal Fluency Direct system, and there may be some incorrect words, spellings, and punctuation that were not noted in checking the note before saving.

## 2022-02-28 NOTE — Nurses Notes (Addendum)
0818-Arrived to OP ONC ambulatory.  Was seen by Dr. Jake Shark via telehealth prior to coming back to OP ONC.  Labs previously obtained via venipuncture by Reche Dixon, MLT and sent to main hospital lab through tube system.  Here for Opdivo and Yervoy infusions. To room 412. Judithann Sauger, RN  0830-Sitting up in bed.  Awake, alert, and oriented x 3.  Pleasant affect.  Does verbalize complaints of slight headache rated 2/10 on pain scale, described as achy.  Intermittent, dry cough noted.  Per pt, has had cough since before starting treatment.  No other voiced complaints of pain or discomfort.  Per pt,  she did have leg pain during previous two Yervoy infusions which continued, intermittently, a day or two after treatment  as well as numbness/tingling of tongue.  Pain in legs described as sore and achy.  Currently, no pain in legs and no numbness or tingling of tongue.  HRR.  Lungs clear in all fields and bases.  Abdomen soft and non-tender with active bowel sounds in all four quadrants.  No s/s of acute distress noted.  VSS. Judithann Sauger, RN  0920-Pepcid 20 mg IV administered.  Pt also took her own Tylenol at this time. Judithann Sauger, RN  0922-Benadry 25 mg IV administered. Judithann Sauger, RN  0939-Solu-cortef 100 mg IV administered. Judithann Sauger, RN  1006-Opdivo infusion started at this time.  No further voiced complaints of HA. Judithann Sauger, RN  1036-Opdivo infusion complete.  Tolerated well.  Judithann Sauger, RN  1112-Yervoy infusion started.  Infusion time increased from 30 minutes to one hour due to previous, possible, adverse reactions.  Judithann Sauger, RN  1212-Yervoy infusion complete.  Tolerated well.  No voiced complaints of leg pain, numbness/tingling of tongue.  Judithann Sauger, RN  1225-Influenza vaccine administered in left deltoid per order.  Tolerated well. Judithann Sauger, RN  1244-Left OP ONC ambulatory. Accompanied by sister-in-law.  No s/s of acute distress noted.  VSS. Judithann Sauger, RN

## 2022-03-21 ENCOUNTER — Ambulatory Visit (HOSPITAL_BASED_OUTPATIENT_CLINIC_OR_DEPARTMENT_OTHER): Payer: 59 | Admitting: HEMATOLOGY-ONCOLOGY

## 2022-03-21 ENCOUNTER — Encounter (HOSPITAL_COMMUNITY): Payer: Self-pay

## 2022-03-21 ENCOUNTER — Other Ambulatory Visit: Payer: Self-pay

## 2022-03-21 ENCOUNTER — Encounter (HOSPITAL_COMMUNITY): Payer: Self-pay | Admitting: HEMATOLOGY-ONCOLOGY

## 2022-03-21 ENCOUNTER — Ambulatory Visit
Admission: RE | Admit: 2022-03-21 | Discharge: 2022-03-21 | Disposition: A | Discharge status: Home / Self Care | Payer: 59 | Source: Ambulatory Visit | Attending: HEMATOLOGY-ONCOLOGY | Admitting: HEMATOLOGY-ONCOLOGY

## 2022-03-21 ENCOUNTER — Ambulatory Visit (HOSPITAL_COMMUNITY)
Admission: RE | Admit: 2022-03-21 | Discharge: 2022-03-21 | Disposition: A | Discharge status: Home / Self Care | Payer: 59 | Source: Ambulatory Visit | Attending: HEMATOLOGY-ONCOLOGY | Admitting: HEMATOLOGY-ONCOLOGY

## 2022-03-21 VITALS — BP 146/70 | HR 70 | Temp 97.5°F | Resp 18 | Ht 69.0 in | Wt 211.2 lb

## 2022-03-21 VITALS — BP 149/75 | HR 77 | Temp 98.3°F | Ht 69.0 in | Wt 211.3 lb

## 2022-03-21 DIAGNOSIS — C642 Malignant neoplasm of left kidney, except renal pelvis: Secondary | ICD-10-CM

## 2022-03-21 DIAGNOSIS — Z5112 Encounter for antineoplastic immunotherapy: Secondary | ICD-10-CM | POA: Insufficient documentation

## 2022-03-21 DIAGNOSIS — Z801 Family history of malignant neoplasm of trachea, bronchus and lung: Secondary | ICD-10-CM | POA: Insufficient documentation

## 2022-03-21 DIAGNOSIS — C799 Secondary malignant neoplasm of unspecified site: Secondary | ICD-10-CM | POA: Insufficient documentation

## 2022-03-21 DIAGNOSIS — Z808 Family history of malignant neoplasm of other organs or systems: Secondary | ICD-10-CM | POA: Insufficient documentation

## 2022-03-21 DIAGNOSIS — Z800 Family history of malignant neoplasm of digestive organs: Secondary | ICD-10-CM | POA: Insufficient documentation

## 2022-03-21 LAB — MAGNESIUM: MAGNESIUM: 1.7 mg/dL — ABNORMAL LOW (ref 1.9–2.7)

## 2022-03-21 LAB — CBC WITH DIFF
BASOPHIL #: 0 10*3/uL (ref 0.00–0.10)
BASOPHIL %: 1 % (ref 0–1)
EOSINOPHIL #: 0.1 10*3/uL (ref 0.00–0.50)
EOSINOPHIL %: 4 %
HCT: 48.5 % — ABNORMAL HIGH (ref 31.2–41.9)
HGB: 15.7 g/dL — ABNORMAL HIGH (ref 10.9–14.3)
LYMPHOCYTE #: 0.8 10*3/uL — ABNORMAL LOW (ref 1.00–3.00)
LYMPHOCYTE %: 19 % (ref 16–44)
MCH: 28.3 pg (ref 24.7–32.8)
MCHC: 32.5 g/dL (ref 32.3–35.6)
MCV: 87.2 fL (ref 75.5–95.3)
MONOCYTE #: 0.4 10*3/uL (ref 0.30–1.00)
MONOCYTE %: 11 % (ref 5–13)
MPV: 9.3 fL (ref 7.9–10.8)
NEUTROPHIL #: 2.6 10*3/uL (ref 1.85–7.80)
NEUTROPHIL %: 65 % (ref 43–77)
PLATELETS: 140 10*3/uL (ref 140–440)
RBC: 5.56 10*6/uL — ABNORMAL HIGH (ref 3.63–4.92)
RDW: 15.3 % (ref 12.3–17.7)
WBC: 4 10*3/uL (ref 3.8–11.8)

## 2022-03-21 LAB — COMPREHENSIVE METABOLIC PANEL, NON-FASTING
ALBUMIN/GLOBULIN RATIO: 1 (ref 0.8–1.4)
ALBUMIN: 4.1 g/dL (ref 3.5–5.7)
ALKALINE PHOSPHATASE: 102 U/L (ref 34–104)
ALT (SGPT): 109 U/L — ABNORMAL HIGH (ref 7–52)
ANION GAP: 8 mmol/L (ref 4–13)
AST (SGOT): 110 U/L — ABNORMAL HIGH (ref 13–39)
BILIRUBIN TOTAL: 1.1 mg/dL (ref 0.3–1.2)
BUN/CREA RATIO: 11 (ref 6–22)
BUN: 7 mg/dL (ref 7–25)
CALCIUM, CORRECTED: 9.3 mg/dL (ref 8.9–10.8)
CALCIUM: 9.4 mg/dL (ref 8.6–10.3)
CHLORIDE: 105 mmol/L (ref 98–107)
CO2 TOTAL: 26 mmol/L (ref 21–31)
CREATININE: 0.66 mg/dL (ref 0.60–1.30)
ESTIMATED GFR: 105 mL/min/{1.73_m2} (ref >59)
GLOBULIN: 4 (ref 2.9–5.4)
GLUCOSE: 114 mg/dL — ABNORMAL HIGH (ref 74–109)
OSMOLALITY, CALCULATED: 276 mOsm/kg (ref 270–290)
POTASSIUM: 3.7 mmol/L (ref 3.5–5.1)
PROTEIN TOTAL: 8.1 g/dL (ref 6.4–8.9)
SODIUM: 139 mmol/L (ref 136–145)

## 2022-03-21 LAB — THYROID STIMULATING HORMONE WITH FREE T4 REFLEX: TSH: 0.763 u[IU]/mL (ref 0.450–5.330)

## 2022-03-21 MED ORDER — SODIUM CHLORIDE 0.9% FLUSH BAG - 250 ML
INTRAVENOUS | Status: DC | PRN
Start: 2022-03-21 — End: 2022-03-22

## 2022-03-21 MED ORDER — SODIUM CHLORIDE 0.9 % INTRAVENOUS SOLUTION
3.0000 mg/kg | Freq: Once | INTRAVENOUS | Status: AC
Start: 2022-03-21 — End: 2022-03-21
  Administered 2022-03-21: 283 mg via INTRAVENOUS
  Administered 2022-03-21: 0 mg via INTRAVENOUS
  Filled 2022-03-21: qty 28.3

## 2022-03-21 MED ORDER — EPINEPHRINE HCL (PF) 1 MG/ML (1 ML) INJECTION SOLUTION
0.3000 mg | Freq: Once | INTRAMUSCULAR | Status: DC | PRN
Start: 2022-03-21 — End: 2022-03-22

## 2022-03-21 MED ORDER — HYDROCORTISONE SOD SUCCINATE 100 MG/2 ML VIAL WRAPPER
100.0000 mg | Freq: Once | INTRAMUSCULAR | Status: DC | PRN
Start: 2022-03-21 — End: 2022-03-22
  Administered 2022-03-21: 100 mg via INTRAVENOUS

## 2022-03-21 MED ORDER — FAMOTIDINE (PF) 20 MG/2 ML INTRAVENOUS SOLUTION
INTRAVENOUS | Status: AC
Start: 2022-03-21 — End: 2022-03-21
  Filled 2022-03-21: qty 2

## 2022-03-21 MED ORDER — DEXTROSE 5% IN WATER (D5W) FLUSH BAG - 250 ML
INTRAVENOUS | Status: DC | PRN
Start: 2022-03-21 — End: 2022-03-22

## 2022-03-21 MED ORDER — MEPERIDINE (PF) 25 MG/ML INJECTION SOLUTION
12.5000 mg | Freq: Once | INTRAMUSCULAR | Status: DC | PRN
Start: 2022-03-21 — End: 2022-03-22

## 2022-03-21 MED ORDER — ALBUTEROL SULFATE HFA 90 MCG/ACTUATION AEROSOL INHALER - RN
2.0000 | Freq: Once | RESPIRATORY_TRACT | Status: DC | PRN
Start: 2022-03-21 — End: 2022-03-22

## 2022-03-21 MED ORDER — DIPHENHYDRAMINE 50 MG/ML INJECTION SOLUTION
25.0000 mg | Freq: Once | INTRAMUSCULAR | Status: DC | PRN
Start: 2022-03-21 — End: 2022-03-22

## 2022-03-21 MED ORDER — SODIUM CHLORIDE 0.9 % INTRAVENOUS SOLUTION
1.0000 mg/kg | Freq: Once | INTRAVENOUS | Status: AC
Start: 2022-03-21 — End: 2022-03-21
  Administered 2022-03-21: 0 mg via INTRAVENOUS
  Administered 2022-03-21: 94 mg via INTRAVENOUS
  Filled 2022-03-21: qty 18.8

## 2022-03-21 MED ORDER — HYDROCORTISONE SOD SUCCINATE 100 MG/2 ML VIAL WRAPPER
INTRAMUSCULAR | Status: AC
Start: 2022-03-21 — End: 2022-03-21
  Filled 2022-03-21: qty 2

## 2022-03-21 MED ORDER — PROCHLORPERAZINE MALEATE 10 MG TABLET
10.0000 mg | ORAL_TABLET | Freq: Once | ORAL | Status: DC | PRN
Start: 2022-03-21 — End: 2022-03-22

## 2022-03-21 MED ORDER — DIPHENHYDRAMINE 50 MG/ML INJECTION SOLUTION
50.0000 mg | Freq: Once | INTRAMUSCULAR | Status: DC | PRN
Start: 2022-03-21 — End: 2022-03-22

## 2022-03-21 MED ORDER — FAMOTIDINE (PF) 20 MG/2 ML INTRAVENOUS SOLUTION
20.0000 mg | Freq: Once | INTRAVENOUS | Status: DC | PRN
Start: 2022-03-21 — End: 2022-03-22
  Administered 2022-03-21: 20 mg via INTRAVENOUS

## 2022-03-21 MED ORDER — ALBUTEROL SULFATE 2.5 MG/3 ML (0.083 %) SOLUTION FOR NEBULIZATION
2.5000 mg | INHALATION_SOLUTION | Freq: Once | RESPIRATORY_TRACT | Status: DC | PRN
Start: 2022-03-21 — End: 2022-03-22

## 2022-03-21 MED ORDER — DIPHENHYDRAMINE 50 MG/ML INJECTION SOLUTION
INTRAMUSCULAR | Status: AC
Start: 2022-03-21 — End: 2022-03-21
  Filled 2022-03-21: qty 1

## 2022-03-21 MED ORDER — PROCHLORPERAZINE EDISYLATE 10 MG/2 ML (5 MG/ML) INJECTION SOLUTION
10.0000 mg | Freq: Once | INTRAMUSCULAR | Status: DC | PRN
Start: 2022-03-21 — End: 2022-03-22

## 2022-03-21 NOTE — Progress Notes (Unsigned)
Department of Hematology/Oncology  History and Physical    Name: Taylor Smith  NOM:[id number]  Date of Birth: 1970/06/26  Encounter Date: 03/21/2022    REFERRING PROVIDER:  No referring provider defined for this encounter.    REASON FOR OFFICE VISIT:  New Smith for evaluation and management of stage IV clear cell renal carcinoma    HISTORY OF PRESENT ILLNESS:Taylor  Taylor Smith is Taylor 51 y.o. female who presents today for initial medical oncology consultation regarding stage IV clear cell carcinoma of Taylor kidney.  Taylor Smith was originally diagnosed after having Taylor biopsy in Taylor genitourinary area.  It came back consistent with clear cell renal cell carcinoma, which prompted an imaging study of Taylor chest, abdomen, and pelvis.  Taylor Smith was found to have Taylor massive left kidney mass with numerous small pulmonary nodules.  Taylor renal mass appears to be involving Taylor renal vein but not Taylor inferior vena cava.    Prior to this, Taylor Smith was not having any symptoms.  Specifically, Taylor Smith did not have any hematuria or history of iron-deficiency anemia.  Taylor Smith does report back pain which has been going on for Taylor past year but it is unclear whether that was related or not.    02/07/2022: Taylor Smith is here for follow up of stage IV kidney cancer.  Taylor Smith that Taylor Smith tolerated Taylor 1st treatment well.  Taylor Smith did have some achiness of Taylor legs which was pretty brief in nature.    02/28/2022: Taylor Smith is here for follow up of stage IV kidney cancer.  Taylor Smith that Taylor Smith has some pain in Taylor Smith legs for Taylor few days following treatment.  Taylor Smith also reports that Taylor Smith is had Taylor cough which has been present since Taylor Smith most recent surgery.  Today will be Taylor Smith 3rd cycle of treatment, and Taylor Smith has Taylor CT scan scheduled for December 20.    03/21/2022:  Taylor Smith is here for follow up of metastatic renal cell carcinoma.  Taylor Smith was due for day 1 of cycle 4 of combination ipilimumab and nivolumab.  Taylor Smith was having some tolerability issues with Taylor combination, but is  receiving additional premedications and Smith that it is helping to make Taylor treatments easier.  Taylor Smith does not have any new issues at this time.    ROS:   Review of Systems   Constitutional:  Negative for appetite change, chills and fatigue.   HENT:   Negative for sore throat and trouble swallowing.    Eyes:  Negative for eye problems.   Respiratory:  Negative for cough and shortness of breath.    Cardiovascular:  Negative for chest pain and leg swelling.   Gastrointestinal:  Negative for abdominal pain.   Genitourinary:  Negative for dysuria and hematuria.    Musculoskeletal:  Negative for arthralgias and gait problem.   Skin:  Negative for rash.   Neurological:  Negative for gait problem.   Hematological:  Negative for adenopathy.   Psychiatric/Behavioral:  Negative for depression.         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)     Kidney cancer, primary, with metastasis from kidney to other site, left (CMS [facility])          Past Surgical History:   Procedure Laterality Date    HX CESAREAN SECTION  HX CHOLECYSTECTOMY      HX CYST REMOVAL Right     HX HYSTERECTOMY      TOTAL THYROIDECTOMY           Social History     Socioeconomic History    Marital status: Married     Spouse name: Not on file    Number of children: Not on file    Years of education: Not on file    Highest education level: Not on file   Occupational History    Not on file   Tobacco Use    Smoking status: Never    Smokeless tobacco: Never   Vaping Use    Vaping Use: Never used   Substance and Sexual Activity    Alcohol use: Never    Drug use: Never    Sexual activity: Yes   Other Topics Concern    Not on file   Social History Narrative    Not on file     Social Determinants of Health     Financial Resource Strain: Not on file   Transportation Needs: Not on file   Social  Connections: Not on file   Intimate Partner Violence: High Risk (01/13/2022)    Intimate Partner Violence     SDOH Domestic Violence: No   Housing Stability: Not on file     Family Medical History:       Problem Relation (Age of Onset)    Cancer Maternal Aunt, Maternal Grandmother    Colon Cancer Maternal Uncle    Lung Cancer Mother    Melanoma Maternal Aunt            Current Outpatient Medications   Medication Sig    Benzonatate (TESSALON) 200 mg Oral Capsule Take 1 Capsule (200 mg total) by mouth Three times Taylor day    cloNIDine HCL (CATAPRES) 0.1 mg Oral Tablet Take 1 Tablet (0.1 mg total) by mouth Once Taylor day    ergocalciferol, vitamin D2, (DRISDOL) 1,250 mcg (50,000 unit) Oral Capsule Take 1 Capsule (50,000 Units total) by mouth Every 7 days    HYDROcodone-acetaminophen (NORCO) 5-325 mg Oral Tablet Take 2 Tablets by mouth Every 4 hours as needed for Pain    Ibuprofen (MOTRIN) 600 mg Oral Tablet Take 1 Tablet (600 mg total) by mouth Every 6 hours as needed    levothyroxine (SYNTHROID) 112 mcg Oral Tablet Take 1 Tablet (112 mcg total) by mouth Every morning    lisinopriL (PRINIVIL) 40 mg Oral Tablet TAKE 1 TABLET BY MOUTH EVERY DAY (Smith taking differently: Take 1 Tablet (40 mg total) by mouth Once Taylor day)    omeprazole (PRILOSEC) 40 mg Oral Capsule, Delayed Release(E.C.) Take 1 Capsule (40 mg total) by mouth Once Taylor day    ondansetron (ZOFRAN ODT) 8 mg Oral Tablet, Rapid Dissolve Place 1 Tablet (8 mg total) under Taylor tongue Every 8 hours as needed for Nausea/Vomiting     No Known Allergies    PHYSICAL EXAM:  Most Recent Vitals    Flowsheet Row Telemedicine from 01/13/2022 in Hematology/Oncology,   [facility]   Temperature 36.7 C (98.1 F) filed at... 01/13/2022 1431   Heart Rate 72 filed at... 01/13/2022 1431   Respiratory Rate --   BP (Non-Invasive) 146/73 filed at... 01/13/2022 1431   SpO2 97 % filed at... 01/13/2022 1431   Height 1.753 m ('5\' 9"'$ ) filed at... 01/13/2022 1431   Weight 94.2 kg  (207 lb 9.6 oz) filed at... 01/13/2022 1431  BMI (Calculated) 30.72 filed at... 01/13/2022 1431   BSA (Calculated) 2.14 filed at... 01/13/2022 1431      ECOG Status: (0) Fully active, able to carry on all predisease performance without restriction   Physical Exam  Constitutional:       General: Taylor Smith is not in acute distress.     Appearance: Normal appearance.   Eyes:      Extraocular Movements: Extraocular movements intact.   Cardiovascular:      Rate and Rhythm: Normal rate and regular rhythm.   Pulmonary:      Effort: Pulmonary effort is normal.   Abdominal:      General: Abdomen is flat.      Palpations: Abdomen is soft.   Musculoskeletal:         General: Normal range of motion.      Cervical back: Normal range of motion.   Skin:     General: Skin is warm and dry.   Neurological:      General: No focal deficit present.      Mental Status: Taylor Smith is alert.   Psychiatric:         Mood and Affect: Mood normal.         DIAGNOSTIC DATA:  No results found for this or any previous visit (from Taylor past 17520 hour(s)).    LABS:   CBC  Diff   Lab Results   Component Value Date/Time    WBC 4.0 03/21/2022 08:04 AM    HGB 15.7 (H) 03/21/2022 08:04 AM    HCT 48.5 (H) 03/21/2022 08:04 AM    PLTCNT 140 03/21/2022 08:04 AM    RBC 5.56 (H) 03/21/2022 08:04 AM    MCV 87.2 03/21/2022 08:04 AM    MCHC 32.5 03/21/2022 08:04 AM    MCH 28.3 03/21/2022 08:04 AM    RDW 15.3 03/21/2022 08:04 AM    MPV 9.3 03/21/2022 08:04 AM    Lab Results   Component Value Date/Time    PMNS 65 03/21/2022 08:04 AM    LYMPHOCYTES 19 03/21/2022 08:04 AM    EOSINOPHIL 4 03/21/2022 08:04 AM    MONOCYTES 11 03/21/2022 08:04 AM    BASOPHILS 1 03/21/2022 08:04 AM    BASOPHILS 0.00 03/21/2022 08:04 AM    PMNABS 2.60 03/21/2022 08:04 AM    LYMPHSABS 0.80 (L) 03/21/2022 08:04 AM    EOSABS 0.10 03/21/2022 08:04 AM    MONOSABS 0.40 03/21/2022 08:04 AM            ASSESSMENT:    ICD-10-CM    1. Kidney cancer, primary, with metastasis from kidney to other site, left (CMS  HCC)  C64.2            PLAN:   1. All relevant medical records were reviewed including available pertinent provider notes, procedure notes, imaging, laboratory, and pathology.   2. All pertinent labs and/or imaging were reviewed with Taylor Smith.   3. Stage IV renal carcinoma:  Taylor Smith has Taylor massive left kidney cancer and numerous metastatic pulmonary nodules, most of which are small.  Taylor Smith also did have Taylor random lesion which had occurred on Taylor vaginal wall which was Taylor presenting symptom for Taylor diagnosis.  We will continue with combination immunotherapy for 4 cycles, followed by nivolumab maintenance.  Taylor Smith that Taylor Smith will also follow up with Taylor physician at [facility] surgical oncology for consideration of debulking surgery if Taylor Smith has Taylor good response.  I will tentatively plan on seeing Taylor Smith back  in 3 weeks for Taylor next cycle of treatment.    DEEANNA BEIGHTOL was given Taylor chance to ask questions, and these were answered to their satisfaction. Taylor Smith is welcome to call with any questions or concerns in Taylor meantime.     On Taylor day of Taylor encounter, Taylor total of 35 minutes was spent on this Smith encounter including review of historical information, examination, documentation and post-visit activities.   Return in about 3 weeks (around 04/11/2022).     Narda Rutherford, MD  03/21/2022, 08:23  Taylor Smith's insurance company bears full legal and financial responsibility resulting from any deviations that they cause to my recommended treatment plan.   CC:  Eleanora Neighbor, FNP-C  [place] [address]  [place] [address]    No referring provider defined for this encounter.    This note was partially generated using MModal Fluency Direct system, and there may be some incorrect words, spellings, and punctuation that were not noted in checking Taylor note before saving.

## 2022-03-21 NOTE — Nurses Notes (Addendum)
0755 Patient arrived to unit for lab work prior to appointment with Dr. Jake Shark. Judge Stall, RN  [phone] Labs drawn via venipuncture by Reche Dixon, MLT. Patient now in exam room to see doctor. Judge Stall, RN  [phone] Patient back to infusion center. Labs still pending. Patient updated. Judge Stall, RN  0830 IV placed. Allergies and home medications updated per patient. Lungs clear. Bowel sounds active, abdomen soft and non-tender. No edema.  Judge Stall, RN  Patient Assessment/Symptom Management Patient Has MD Appointment Today   Key: (+) Symptom present           (-)  Symptom not present If Symptom is Positive(+) a Nursing Note is required   Edema -   Uncontrolled Nausea -   Vomiting -   Inability to eat/drink -   Mouth Sores -   Diarrhea -   Constipation (? Last BM) -   Fatigue that interferes with ADL's -   Numbness/Tingling -change -   Other -   Fever/Signs & Symptoms of infection -   Nurse Initials AN   Patient denies any side effects today. Patient has had previous infusion reactions, see previous nurses notes. She requests to be premedicated prior to opdivo and yervoy infusions. Judge Stall, RN  [phone] Dr. Jake Shark made aware of elevated LFTs through secure messaging. Judge Stall, RN  1000 Orders given to go ahead with treatment today. I educated patient to report any pain in right upper quadrant immediately. All orders released to pharmacy. Judge Stall, RN  [phone] solu-cortef given IVP. Judge Stall, RN  1005 Pepcid given IVP. Judge Stall, RN  1007 Benadryl given IVP. Judge Stall, RN  [phone] Opdivo infusion started. Call light within reach. Judge Stall, RN  1140 Opdivo infusion complete. Patient tolerated infusion well. Judge Stall, RN  [phone] Yervoy infusion started. Call light within reach. Judge Stall, RN  1247 Yervoy infusion complete. Patient tolerated infusion well. Judge Stall, RN  [phone] Patient had no infusion reaction with today's treatment. IV flushed with  NS and removed. Pressure dressing applied. VSS. Judge Stall, RN  [phone] Patient left unit ambulatory. Judge Stall, RN

## 2022-03-31 ENCOUNTER — Other Ambulatory Visit (HOSPITAL_BASED_OUTPATIENT_CLINIC_OR_DEPARTMENT_OTHER): Payer: Self-pay | Admitting: Adult Health

## 2022-03-31 DIAGNOSIS — C642 Malignant neoplasm of left kidney, except renal pelvis: Secondary | ICD-10-CM

## 2022-04-02 ENCOUNTER — Ambulatory Visit (HOSPITAL_BASED_OUTPATIENT_CLINIC_OR_DEPARTMENT_OTHER): Payer: Self-pay | Admitting: Urology

## 2022-04-02 NOTE — Telephone Encounter (Signed)
(  H) patient calling stating received a message to schedule a CT, but is scheduled 12/20 and doesn't understand  why she got message, requesting a return call to confirm and verify please.   Returned call to patient. Reviewed with her that both CT's the doctor ordered has been scheduled. Verified date and times of appts for 04/09/22. The patient verbalized understanding and offers no additional questions at this time.  Vassie Moment, RN

## 2022-04-09 ENCOUNTER — Other Ambulatory Visit: Payer: Self-pay

## 2022-04-09 ENCOUNTER — Inpatient Hospital Stay (HOSPITAL_BASED_OUTPATIENT_CLINIC_OR_DEPARTMENT_OTHER)
Admission: RE | Admit: 2022-04-09 | Discharge: 2022-04-09 | Disposition: A | Discharge status: Home / Self Care | Payer: 59 | Source: Ambulatory Visit

## 2022-04-09 ENCOUNTER — Inpatient Hospital Stay (HOSPITAL_BASED_OUTPATIENT_CLINIC_OR_DEPARTMENT_OTHER)
Admission: RE | Admit: 2022-04-09 | Discharge: 2022-04-09 | Disposition: A | Discharge status: Home / Self Care | Payer: 59 | Source: Ambulatory Visit | Attending: Family | Admitting: Family

## 2022-04-09 ENCOUNTER — Ambulatory Visit: Payer: 59 | Attending: Urology | Admitting: Urology

## 2022-04-09 VITALS — BP 142/71 | HR 64 | Temp 95.5°F | Resp 18 | Ht 69.0 in | Wt 210.4 lb

## 2022-04-09 DIAGNOSIS — I10 Essential (primary) hypertension: Secondary | ICD-10-CM | POA: Insufficient documentation

## 2022-04-09 DIAGNOSIS — R937 Abnormal findings on diagnostic imaging of other parts of musculoskeletal system: Secondary | ICD-10-CM

## 2022-04-09 DIAGNOSIS — C7982 Secondary malignant neoplasm of genital organs: Secondary | ICD-10-CM | POA: Insufficient documentation

## 2022-04-09 DIAGNOSIS — N2889 Other specified disorders of kidney and ureter: Secondary | ICD-10-CM

## 2022-04-09 DIAGNOSIS — Z79899 Other long term (current) drug therapy: Secondary | ICD-10-CM | POA: Insufficient documentation

## 2022-04-09 DIAGNOSIS — C642 Malignant neoplasm of left kidney, except renal pelvis: Secondary | ICD-10-CM | POA: Insufficient documentation

## 2022-04-09 DIAGNOSIS — Z803 Family history of malignant neoplasm of breast: Secondary | ICD-10-CM | POA: Insufficient documentation

## 2022-04-09 DIAGNOSIS — I823 Embolism and thrombosis of renal vein: Secondary | ICD-10-CM

## 2022-04-09 DIAGNOSIS — C7802 Secondary malignant neoplasm of left lung: Secondary | ICD-10-CM | POA: Insufficient documentation

## 2022-04-09 DIAGNOSIS — C7800 Secondary malignant neoplasm of unspecified lung: Secondary | ICD-10-CM

## 2022-04-09 DIAGNOSIS — M899 Disorder of bone, unspecified: Secondary | ICD-10-CM | POA: Insufficient documentation

## 2022-04-09 DIAGNOSIS — C649 Malignant neoplasm of unspecified kidney, except renal pelvis: Secondary | ICD-10-CM

## 2022-04-09 DIAGNOSIS — Z801 Family history of malignant neoplasm of trachea, bronchus and lung: Secondary | ICD-10-CM | POA: Insufficient documentation

## 2022-04-09 MED ORDER — IOPAMIDOL 370 MG IODINE/ML (76 %) INTRAVENOUS SOLUTION
70.0000 mL | INTRAVENOUS | Status: AC
Start: 2022-04-09 — End: 2022-04-09
  Administered 2022-04-09: 70 mL via INTRAVENOUS

## 2022-04-09 MED ORDER — IOPAMIDOL 370 MG IODINE/ML (76 %) INTRAVENOUS SOLUTION
100.0000 mL | INTRAVENOUS | Status: AC
Start: 2022-04-09 — End: 2022-04-09
  Administered 2022-04-09: 100 mL via INTRAVENOUS

## 2022-04-09 NOTE — Cancer Center Note (Signed)
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 female with:    1. 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 vaginal metastasis, possible thoracic spine lesion?)    12/2021 - Hysterectomy with vaginal lesion positive for ccRCC, CT-CAP - large bulky locally invasive left renal mass with direct extension into adrenal gland and renal vein thrombus   01/2022 - Started Ipi/Nivo, planned 4 cycles   03/2022 - Completed 4 x cycles of Ipi/Nivo  3. Surgical history includes hysterectomy, cholecystectomy, appendectomy, and c-section x3   4. Medical history of HTN     PLAN:   1. Reviewed cross sectional CT scan with patient/husband. Likely there is treatment response in lung region but awaiting official reading. Given the extent of her disease, she may require additional systemic therapy prior to attempting a debulking procedure.   2. Add to up coming January 2024 genitourinary tumor board for radiology review and planning  3. Continue systemic therapy with [facility]  4. We will call the patient after up coming Tumor Board for final planning.     SUBJECTIVE:   51 y.o. female presents today for metastatic renal cell carcinoma. Continues with cycle 4 ipi/nivo. Doing well. Tolerating. Has post Tx nasuea. No FCNV.       Medical history: HTN    Surgical history: Thyroidectomy 2019, Hysterectomy 2023, Cholecystectomy, Appendectomy, C-section x 3    Family history: Breast and lung-mother    Tobacco: Never smoker   Alcohol: Never   ECOG: 0 - Fully active, asymptomatic   Blood thinners: None      OBJECTIVE:   Current Outpatient Medications   Medication Sig    Benzonatate (TESSALON) 200 mg Oral Capsule Take 1 Capsule (200 mg total) by mouth Three times a day    cloNIDine HCL (CATAPRES) 0.1 mg Oral Tablet Take 1 Tablet (0.1 mg total) by mouth Once a day     ergocalciferol, vitamin D2, (DRISDOL) 1,250 mcg (50,000 unit) Oral Capsule Take 1 Capsule (50,000 Units total) by mouth Every 7 days    HYDROcodone-acetaminophen (NORCO) 5-325 mg Oral Tablet Take 2 Tablets by mouth Every 4 hours as needed for Pain (Patient not taking: Reported on 04/09/2022)    Ibuprofen (MOTRIN) 600 mg Oral Tablet Take 1 Tablet (600 mg total) by mouth Every 6 hours as needed    levothyroxine (SYNTHROID) 112 mcg Oral Tablet Take 1 Tablet (112 mcg total) by mouth Every morning    lisinopriL (PRINIVIL) 40 mg Oral Tablet TAKE 1 TABLET BY MOUTH EVERY DAY (Patient taking differently: Take 1 Tablet (40 mg total) by mouth Once a day)    omeprazole (PRILOSEC) 40 mg Oral Capsule, Delayed Release(E.C.) Take 1 Capsule (40 mg total) by mouth Once a day    ondansetron (ZOFRAN ODT) 8 mg Oral Tablet, Rapid Dissolve Place 1 Tablet (8 mg total) under the tongue Every 8 hours as needed for Nausea/Vomiting     BP (!) 142/71   Pulse 64   Temp (!) 35.3 C (95.5 F)   Resp 18   Ht 1.753 m ('5\' 9"'$ )   Wt 95.4 kg (210 lb 6.4 oz)   SpO2 98%   BMI 31.07 kg/m       PHYSICAL EXAM:  General: Patient is vitally stable (see values). Appears calm and at rest. Dressed appropriately.  Skin: Warm and dry.     Eyes: Eyes are clear.    Pulmonary: Respiratory effort is unlabored.     Psychiatric: Patient is alert, appropriate mood, and in no acute distress.     Musculoskeletal: Patient ambulates without assistance   Cardiovascular: Palpable peripheral pulses.    Neurologic: Neurological exam is consistent with patient's age.      Gastrointestinal: The abdomen is soft, nontender and non-distended.    Genitourinary: No costovertebral angle tenderness bilaterally.        ATTESTATION:   This is a shared service visit    Edythe Clarity, APRN,AGPCNP-BC     I personally saw and evaluated the patient as part of a shared service with an APP. MDM (complete): RPV Level 4 (Moderate Complexity). On the day of the encounter, I independently of  the APP spent a total of 30 mins  in direct/indirect care of this patient including initial evaluation, review of laboratory, radiology, diagnostic studies, review of medical record, examination, order entry, coordination of care, documentation, and post-visit activities. The note above has been reviewed and/or edited by me to reflect my clinical assessment and medical decision making for this patient. Furthermore, the assessment and plan for this patient were created by me and were included at the top if this note for ease of reference.      Glendora Score, MD  Assistant Professor, Surgeon   Division of Urologic Oncology  Department of Urology   [facility]

## 2022-04-10 DIAGNOSIS — C642 Malignant neoplasm of left kidney, except renal pelvis: Secondary | ICD-10-CM

## 2022-04-10 DIAGNOSIS — R161 Splenomegaly, not elsewhere classified: Secondary | ICD-10-CM

## 2022-04-10 DIAGNOSIS — N2889 Other specified disorders of kidney and ureter: Secondary | ICD-10-CM

## 2022-04-11 ENCOUNTER — Ambulatory Visit
Admission: RE | Admit: 2022-04-11 | Discharge: 2022-04-11 | Disposition: A | Discharge status: Home / Self Care | Payer: 59 | Source: Ambulatory Visit | Attending: HEMATOLOGY-ONCOLOGY | Admitting: HEMATOLOGY-ONCOLOGY

## 2022-04-11 ENCOUNTER — Ambulatory Visit (HOSPITAL_COMMUNITY)
Admission: RE | Admit: 2022-04-11 | Discharge: 2022-04-11 | Disposition: A | Discharge status: Home / Self Care | Payer: 59 | Source: Ambulatory Visit | Attending: HEMATOLOGY-ONCOLOGY | Admitting: HEMATOLOGY-ONCOLOGY

## 2022-04-11 ENCOUNTER — Other Ambulatory Visit: Payer: Self-pay

## 2022-04-11 ENCOUNTER — Encounter (HOSPITAL_COMMUNITY): Payer: Self-pay | Admitting: HEMATOLOGY-ONCOLOGY

## 2022-04-11 ENCOUNTER — Ambulatory Visit (HOSPITAL_BASED_OUTPATIENT_CLINIC_OR_DEPARTMENT_OTHER): Payer: 59 | Admitting: HEMATOLOGY-ONCOLOGY

## 2022-04-11 VITALS — BP 133/71 | HR 70 | Temp 98.2°F | Ht 69.0 in | Wt 209.5 lb

## 2022-04-11 DIAGNOSIS — C799 Secondary malignant neoplasm of unspecified site: Secondary | ICD-10-CM | POA: Insufficient documentation

## 2022-04-11 DIAGNOSIS — Z800 Family history of malignant neoplasm of digestive organs: Secondary | ICD-10-CM | POA: Insufficient documentation

## 2022-04-11 DIAGNOSIS — C642 Malignant neoplasm of left kidney, except renal pelvis: Secondary | ICD-10-CM

## 2022-04-11 DIAGNOSIS — Z809 Family history of malignant neoplasm, unspecified: Secondary | ICD-10-CM | POA: Insufficient documentation

## 2022-04-11 DIAGNOSIS — Z808 Family history of malignant neoplasm of other organs or systems: Secondary | ICD-10-CM | POA: Insufficient documentation

## 2022-04-11 DIAGNOSIS — Z801 Family history of malignant neoplasm of trachea, bronchus and lung: Secondary | ICD-10-CM | POA: Insufficient documentation

## 2022-04-11 LAB — COMPREHENSIVE METABOLIC PANEL, NON-FASTING
ALBUMIN/GLOBULIN RATIO: 1 (ref 0.8–1.4)
ALBUMIN: 3.8 g/dL (ref 3.5–5.7)
ALKALINE PHOSPHATASE: 255 U/L — ABNORMAL HIGH (ref 34–104)
ALT (SGPT): 1133 U/L — ABNORMAL HIGH (ref 7–52)
ANION GAP: 7 mmol/L (ref 4–13)
AST (SGOT): 1486 U/L — ABNORMAL HIGH (ref 13–39)
BILIRUBIN TOTAL: 8.9 mg/dL — ABNORMAL HIGH (ref 0.3–1.2)
BUN/CREA RATIO: 10 (ref 6–22)
BUN: 7 mg/dL (ref 7–25)
CALCIUM, CORRECTED: 9.5 mg/dL (ref 8.9–10.8)
CALCIUM: 9.3 mg/dL (ref 8.6–10.3)
CHLORIDE: 104 mmol/L (ref 98–107)
CO2 TOTAL: 25 mmol/L (ref 21–31)
CREATININE: 0.7 mg/dL (ref 0.60–1.30)
ESTIMATED GFR: 104 mL/min/{1.73_m2} (ref >59)
GLOBULIN: 3.8 (ref 2.9–5.4)
GLUCOSE: 166 mg/dL — ABNORMAL HIGH (ref 74–109)
OSMOLALITY, CALCULATED: 274 mOsm/kg (ref 270–290)
POTASSIUM: 3.8 mmol/L (ref 3.5–5.1)
PROTEIN TOTAL: 7.6 g/dL (ref 6.4–8.9)
SODIUM: 136 mmol/L (ref 136–145)

## 2022-04-11 LAB — CBC WITH DIFF
BASOPHIL #: 0 10*3/uL (ref 0.00–0.10)
BASOPHIL %: 1 % (ref 0–1)
EOSINOPHIL #: 0.1 10*3/uL (ref 0.00–0.50)
EOSINOPHIL %: 3 %
HCT: 50.4 % — ABNORMAL HIGH (ref 31.2–41.9)
HGB: 16.5 g/dL — ABNORMAL HIGH (ref 10.9–14.3)
LYMPHOCYTE #: 0.7 10*3/uL — ABNORMAL LOW (ref 1.00–3.00)
LYMPHOCYTE %: 18 % (ref 16–44)
MCH: 28.7 pg (ref 24.7–32.8)
MCHC: 32.8 g/dL (ref 32.3–35.6)
MCV: 87.4 fL (ref 75.5–95.3)
MONOCYTE #: 0.4 10*3/uL (ref 0.30–1.00)
MONOCYTE %: 10 % (ref 5–13)
MPV: 9.5 fL (ref 7.9–10.8)
NEUTROPHIL #: 2.8 10*3/uL (ref 1.85–7.80)
NEUTROPHIL %: 68 % (ref 43–77)
PLATELETS: 125 10*3/uL — ABNORMAL LOW (ref 140–440)
RBC: 5.77 10*6/uL — ABNORMAL HIGH (ref 3.63–4.92)
RDW: 16.6 % (ref 12.3–17.7)
WBC: 4.1 10*3/uL (ref 3.8–11.8)

## 2022-04-11 LAB — MAGNESIUM: MAGNESIUM: 1.8 mg/dL — ABNORMAL LOW (ref 1.9–2.7)

## 2022-04-11 LAB — THYROID STIMULATING HORMONE WITH FREE T4 REFLEX: TSH: 0.647 u[IU]/mL (ref 0.450–5.330)

## 2022-04-11 MED ORDER — METHYLPREDNISOLONE SOD SUCC 125 MG SOLUTION FOR INJECTION WRAPPER
125.0000 mg | Freq: Once | INTRAVENOUS | Status: DC
Start: 2022-04-11 — End: 2022-12-31

## 2022-04-11 MED ORDER — METHYLPREDNISOLONE SOD SUCC 125 MG SOLUTION FOR INJECTION WRAPPER
INTRAVENOUS | Status: AC
Start: 2022-04-11 — End: 2022-04-11
  Filled 2022-04-11: qty 2

## 2022-04-11 MED ORDER — PREDNISONE 50 MG TABLET
100.0000 mg | ORAL_TABLET | Freq: Every day | ORAL | 0 refills | Status: DC
Start: 2022-04-11 — End: 2022-05-16

## 2022-04-11 MED ORDER — SODIUM CHLORIDE 0.9 % IV BOLUS
1000.0000 mL | INJECTION | Freq: Once | Status: AC
Start: 2022-04-11 — End: 2022-04-11
  Administered 2022-04-11: 0 mL via INTRAVENOUS
  Administered 2022-04-11: 1000 mL via INTRAVENOUS

## 2022-04-11 MED ORDER — METHYLPREDNISOLONE SOD SUCC 125 MG SOLUTION FOR INJECTION WRAPPER
125.0000 mg | INTRAVENOUS | Status: AC
Start: 2022-04-11 — End: 2022-04-11
  Administered 2022-04-11: 125 mg via INTRAVENOUS

## 2022-04-11 NOTE — Progress Notes (Unsigned)
Department of Hematology/Oncology  History and Physical    Name: Taylor Smith  DUK:[id number]  Date of Birth: 05/12/1970  Encounter Date: 04/11/2022    REFERRING PROVIDER:  No referring provider defined for this encounter.    TELEMEDICINE DOCUMENTATION:  Patient Location:  [facility], [facility] outpatient Hematology/Oncology [address], [place] [address]  Patient/family aware of provider location:  yes  Patient/family consent for telemedicine:  yes    REASON FOR OFFICE VISIT:  New patient for evaluation and management of stage IV clear cell renal carcinoma    HISTORY OF PRESENT ILLNESS:A  Taylor Smith is a 51 y.o. 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  She was originally diagnosed after having a biopsy in the genitourinary area.  It came back consistent with clear cell renal cell carcinoma, which prompted an imaging study of the chest, abdomen, and pelvis.  She was found to have a massive left kidney mass with numerous small pulmonary nodules.  The renal mass appears to be involving the renal vein but not the inferior vena cava.    Prior to this, she was not having any symptoms.  Specifically, she did not have any hematuria or history of iron-deficiency anemia.  She does report back pain which has been going on for the past year but it is unclear whether that was related or not.    02/07/2022: The patient is here for follow up of stage IV kidney cancer.  She states that she tolerated the 1st treatment well.  She did have some achiness of the legs which was pretty brief in nature.    02/28/2022: The patient is here for follow up of stage IV kidney cancer.  She states that she has some pain in her legs for a few days following treatment.  She also reports that she is had a cough which has been present since her most recent surgery.  Today will be her 3rd cycle of treatment, and she has a CT scan scheduled for  December 20.    03/21/2022:  The patient is here for follow up of metastatic renal cell carcinoma.  She was due for day 1 of cycle 4 of combination ipilimumab and nivolumab.  She was having some tolerability issues with the combination, but is receiving additional premedications and states that it is helping to make the treatments easier.  She does not have any new issues at this time.    04/11/2022: The patient is here for follow up of metastatic renal cell cancer.  She has been on treatment with combination immunotherapy.  At last contact, her transaminases were mildly elevated.  On this visit, her bilirubin and transaminases are markedly higher.  She states that she did see a surgeon yesterday for consideration of nephrectomy.  She indicates that there was not a discussion regarding a jaundiced appearance at that time.  She had CT imaging performed approximately 2 days ago.    ROS:   Review of Systems   Constitutional:  Negative for appetite change, chills and fatigue.   HENT:   Negative for sore throat and trouble swallowing.    Eyes:  Negative for eye problems.   Respiratory:  Negative for cough and shortness of breath.    Cardiovascular:  Negative for chest pain and leg swelling.   Gastrointestinal:  Negative for abdominal pain.   Genitourinary:  Negative for dysuria and hematuria.    Musculoskeletal:  Negative  for arthralgias and gait problem.   Skin:  Negative for rash.   Neurological:  Negative for gait problem.   Hematological:  Negative for adenopathy.   Psychiatric/Behavioral:  Negative for depression.         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)     Kidney cancer, primary, with metastasis from kidney to other site, left (CMS [facility])          Past Surgical History:   Procedure Laterality Date    HX CESAREAN SECTION      HX  CHOLECYSTECTOMY      HX CYST REMOVAL Right     HX HYSTERECTOMY      TOTAL THYROIDECTOMY           Social History     Socioeconomic History    Marital status: Married     Spouse name: Not on file    Number of children: Not on file    Years of education: Not on file    Highest education level: Not on file   Occupational History    Not on file   Tobacco Use    Smoking status: Never    Smokeless tobacco: Never   Vaping Use    Vaping Use: Never used   Substance and Sexual Activity    Alcohol use: Never    Drug use: Never    Sexual activity: Yes   Other Topics Concern    Not on file   Social History Narrative    Not on file     Social Determinants of Health     Financial Resource Strain: Not on file   Transportation Needs: Not on file   Social Connections: Not on file   Intimate Partner Violence: High Risk (01/13/2022)    Intimate Partner Violence     SDOH Domestic Violence: No   Housing Stability: Not on file     Family Medical History:       Problem Relation (Age of Onset)    Cancer Maternal Aunt, Maternal Grandmother    Colon Cancer Maternal Uncle    Lung Cancer Mother    Melanoma Maternal Aunt            Current Outpatient Medications   Medication Sig    Benzonatate (TESSALON) 200 mg Oral Capsule Take 1 Capsule (200 mg total) by mouth Three times a day    cloNIDine HCL (CATAPRES) 0.1 mg Oral Tablet Take 1 Tablet (0.1 mg total) by mouth Once a day    ergocalciferol, vitamin D2, (DRISDOL) 1,250 mcg (50,000 unit) Oral Capsule Take 1 Capsule (50,000 Units total) by mouth Every 7 days    HYDROcodone-acetaminophen (NORCO) 5-325 mg Oral Tablet Take 2 Tablets by mouth Every 4 hours as needed for Pain    Ibuprofen (MOTRIN) 600 mg Oral Tablet Take 1 Tablet (600 mg total) by mouth Every 6 hours as needed    levothyroxine (SYNTHROID) 112 mcg Oral Tablet Take 1 Tablet (112 mcg total) by mouth Every morning    lisinopriL (PRINIVIL) 40 mg Oral Tablet TAKE 1 TABLET BY MOUTH EVERY DAY (Patient taking differently: Take 1 Tablet (40 mg  total) by mouth Once a day)    omeprazole (PRILOSEC) 40 mg Oral Capsule, Delayed Release(E.C.) Take 1 Capsule (40 mg total) by mouth Once a day  ondansetron (ZOFRAN ODT) 8 mg Oral Tablet, Rapid Dissolve Place 1 Tablet (8 mg total) under the tongue Every 8 hours as needed for Nausea/Vomiting    predniSONE (DELTASONE) 50 mg Oral Tablet Take 2 Tablets (100 mg total) by mouth Once a day     No Known Allergies    PHYSICAL EXAM:  Most Recent Vitals    Flowsheet Row Telemedicine from 01/13/2022 in Hematology/Oncology,   [facility]   Temperature 36.7 C (98.1 F) filed at... 01/13/2022 1431   Heart Rate 72 filed at... 01/13/2022 1431   Respiratory Rate --   BP (Non-Invasive) 146/73 filed at... 01/13/2022 1431   SpO2 97 % filed at... 01/13/2022 1431   Height 1.753 m ('5\' 9"'$ ) filed at... 01/13/2022 1431   Weight 94.2 kg (207 lb 9.6 oz) filed at... 01/13/2022 1431   BMI (Calculated) 30.72 filed at... 01/13/2022 1431   BSA (Calculated) 2.14 filed at... 01/13/2022 1431      ECOG Status: (0) Fully active, able to carry on all predisease performance without restriction   Physical Exam  Constitutional:       General: She is not in acute distress.     Appearance: Normal appearance.   Eyes:      Extraocular Movements: Extraocular movements intact.   Cardiovascular:      Rate and Rhythm: Normal rate and regular rhythm.   Pulmonary:      Effort: Pulmonary effort is normal.   Abdominal:      General: Abdomen is flat.      Palpations: Abdomen is soft.   Musculoskeletal:         General: Normal range of motion.      Cervical back: Normal range of motion.   Skin:     General: Skin is warm and dry.   Neurological:      General: No focal deficit present.      Mental Status: She is alert.   Psychiatric:         Mood and Affect: Mood normal.         DIAGNOSTIC DATA:  No results found for this or any previous visit (from the past 17520 hour(s)).    LABS:   CBC  Diff   Lab Results   Component Value Date/Time    WBC 4.1  04/11/2022 08:22 AM    HGB 16.5 (H) 04/11/2022 08:22 AM    HCT 50.4 (H) 04/11/2022 08:22 AM    PLTCNT 125 (L) 04/11/2022 08:22 AM    RBC 5.77 (H) 04/11/2022 08:22 AM    MCV 87.4 04/11/2022 08:22 AM    MCHC 32.8 04/11/2022 08:22 AM    MCH 28.7 04/11/2022 08:22 AM    RDW 16.6 04/11/2022 08:22 AM    MPV 9.5 04/11/2022 08:22 AM    Lab Results   Component Value Date/Time    PMNS 68 04/11/2022 08:22 AM    LYMPHOCYTES 18 04/11/2022 08:22 AM    EOSINOPHIL 3 04/11/2022 08:22 AM    MONOCYTES 10 04/11/2022 08:22 AM    BASOPHILS 1 04/11/2022 08:22 AM    BASOPHILS 0.00 04/11/2022 08:22 AM    PMNABS 2.80 04/11/2022 08:22 AM    LYMPHSABS 0.70 (L) 04/11/2022 08:22 AM    EOSABS 0.10 04/11/2022 08:22 AM    MONOSABS 0.40 04/11/2022 08:22 AM            ASSESSMENT:    ICD-10-CM    1. Kidney cancer, primary, with metastasis from kidney to other site, left (CMS [facility])  C64.2 COMPREHENSIVE METABOLIC PANEL, NON-FASTING     CBC/DIFF     CANCELED: CT CHEST ABDOMEN PELVIS W IV CONTRAST           PLAN:   1. All relevant medical records were reviewed including available pertinent provider notes, procedure notes, imaging, laboratory, and pathology.   2. All pertinent labs and/or imaging were reviewed with the patient.   3. Stage IV renal carcinoma:  She is status post 4 cycles of combination immunotherapy.  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  We will see her back in 1 week and reassess labs.  Immunotherapy will be on hold for now.  We reviewed the recent imaging studies, and it shows no evidence of any obvious hepatic lesions or duct obstruction.  This would support the autoimmune hypothesis for the recent laboratory change.  Unfortunately, it also does not appear as though there has been any significant decrease in size of the renal lesion.  It was slightly longer in 1 dimension and slightly  smaller in to others.  The overall burden of disease is probably similar.  We will reassess in 1 week can try to make a plan at that time for moving forward.    DEBANHI BLAKER was given the chance to ask questions, and these were answered to their satisfaction. The patient is welcome to call with any questions or concerns in the meantime.     On the day of the encounter, a total of 35 minutes was spent on this patient encounter including review of historical information, examination, documentation and post-visit activities.   Return in about 1 week (around 04/18/2022).     Narda Rutherford, MD  04/11/2022, 10:12  The patient was seen as part of a collaborative telemedicine service with Dr. Jake Shark who participated in the encounter by active presence via approved video/audio means for portions of the encounter.  The patient's insurance company bears full legal and financial responsibility resulting from any deviations that they cause to my recommended treatment plan.   CC:  Eleanora Neighbor, FNP-C  [address]  [place] [id number]    No referring provider defined for this encounter.    This note was partially generated using MModal Fluency Direct system, and there may be some incorrect words, spellings, and punctuation that were not noted in checking the note before saving.

## 2022-04-11 NOTE — Nurses Notes (Signed)
0805 Patient arrived to unit for lab work prior to appointment with Dr. Jake Shark. Patient is ambulatory to unit. Judge Stall, RN  [phone] Labs collected via venipuncture by Reche Dixon, MLT. Judge Stall, RN  [phone] Patient at oncology clinic for office appointment. Judge Stall, RN  1000 Patient back to infusion center. Orders given to hold opdivo infusion and give solu-medrol 125 mg and 1L NS for elevated LFTs and bilirubin. Patient back to room 413 for fluids. Solu-medrol changed from IM to IV since IV will need to be obtained for fluids. Judge Stall, RN  [phone] Physical assessment: Lungs clear. Bowel sounds active. Abdomen soft and non-tender. No edema to lower extremities. Patients skin and sclera is jaundiced. I explained to her that it is because of the elevated bilirubin. Patient reports some nausea within the past week. She has nausea medication at home. I instructed her to return to hospital if she has worsening nausea and vomiting, abdominal pain, dark colored urine. IV inserted. Patient tolerated well. Judge Stall, RN  [phone] Solu-medrol given IVP. Judge Stall, RN  1012 NS bolus started. Judge Stall, RN  [phone] NS bolus complete. Judge Stall, RN  1125 IV removed and pressure dressing applied. VSS. Dr. Jake Shark messaged as to when patient needs to start oral steroids. Orders given to start oral steroids tomorrow and patient made aware. Judge Stall, RN  [phone] Patient left unit ambulatory. Judge Stall, RN

## 2022-04-14 ENCOUNTER — Other Ambulatory Visit (INDEPENDENT_AMBULATORY_CARE_PROVIDER_SITE_OTHER): Payer: Self-pay | Admitting: NURSE PRACTITIONER

## 2022-04-16 ENCOUNTER — Emergency Department
Admission: EM | Admit: 2022-04-16 | Discharge: 2022-04-16 | Disposition: A | Discharge status: Home / Self Care | Payer: 59 | Attending: Emergency Medicine | Admitting: Emergency Medicine

## 2022-04-16 ENCOUNTER — Emergency Department (HOSPITAL_COMMUNITY): Payer: 59

## 2022-04-16 ENCOUNTER — Other Ambulatory Visit: Payer: Self-pay

## 2022-04-16 DIAGNOSIS — R748 Abnormal levels of other serum enzymes: Secondary | ICD-10-CM | POA: Insufficient documentation

## 2022-04-16 DIAGNOSIS — Z7952 Long term (current) use of systemic steroids: Secondary | ICD-10-CM | POA: Insufficient documentation

## 2022-04-16 DIAGNOSIS — R161 Splenomegaly, not elsewhere classified: Secondary | ICD-10-CM | POA: Insufficient documentation

## 2022-04-16 DIAGNOSIS — R590 Localized enlarged lymph nodes: Secondary | ICD-10-CM | POA: Insufficient documentation

## 2022-04-16 DIAGNOSIS — C642 Malignant neoplasm of left kidney, except renal pelvis: Secondary | ICD-10-CM | POA: Insufficient documentation

## 2022-04-16 DIAGNOSIS — I491 Atrial premature depolarization: Secondary | ICD-10-CM

## 2022-04-16 DIAGNOSIS — J984 Other disorders of lung: Secondary | ICD-10-CM | POA: Insufficient documentation

## 2022-04-16 DIAGNOSIS — Z79899 Other long term (current) drug therapy: Secondary | ICD-10-CM | POA: Insufficient documentation

## 2022-04-16 DIAGNOSIS — R109 Unspecified abdominal pain: Secondary | ICD-10-CM

## 2022-04-16 DIAGNOSIS — C7800 Secondary malignant neoplasm of unspecified lung: Secondary | ICD-10-CM | POA: Insufficient documentation

## 2022-04-16 DIAGNOSIS — R1010 Upper abdominal pain, unspecified: Secondary | ICD-10-CM | POA: Insufficient documentation

## 2022-04-16 LAB — CBC WITH DIFF
BASOPHIL #: 0 10*3/uL (ref 0.00–0.10)
BASOPHIL %: 0 % (ref 0–1)
EOSINOPHIL #: 0 10*3/uL (ref 0.00–0.50)
EOSINOPHIL %: 0 % — ABNORMAL LOW
HCT: 52.4 % — ABNORMAL HIGH (ref 31.2–41.9)
HGB: 17.4 g/dL — ABNORMAL HIGH (ref 10.9–14.3)
LYMPHOCYTE #: 1.1 10*3/uL (ref 1.00–3.00)
LYMPHOCYTE %: 8 % — ABNORMAL LOW (ref 16–44)
MCH: 29.4 pg (ref 24.7–32.8)
MCHC: 33.3 g/dL (ref 32.3–35.6)
MCV: 88.3 fL (ref 75.5–95.3)
MONOCYTE #: 1.1 10*3/uL — ABNORMAL HIGH (ref 0.30–1.00)
MONOCYTE %: 8 % (ref 5–13)
MPV: 9.9 fL (ref 7.9–10.8)
NEUTROPHIL #: 11.5 10*3/uL — ABNORMAL HIGH (ref 1.85–7.80)
NEUTROPHIL %: 84 % — ABNORMAL HIGH (ref 43–77)
PLATELETS: 132 10*3/uL — ABNORMAL LOW (ref 140–440)
RBC: 5.93 10*6/uL — ABNORMAL HIGH (ref 3.63–4.92)
RDW: 17.2 % (ref 12.3–17.7)
WBC: 13.7 10*3/uL — ABNORMAL HIGH (ref 3.8–11.8)

## 2022-04-16 LAB — COMPREHENSIVE METABOLIC PANEL, NON-FASTING
ALBUMIN/GLOBULIN RATIO: 1 (ref 0.8–1.4)
ALBUMIN: 4.2 g/dL (ref 3.5–5.7)
ALKALINE PHOSPHATASE: 189 U/L — ABNORMAL HIGH (ref 34–104)
ALT (SGPT): 578 U/L — ABNORMAL HIGH (ref 7–52)
ANION GAP: 9 mmol/L (ref 4–13)
AST (SGOT): 246 U/L — ABNORMAL HIGH (ref 13–39)
BILIRUBIN TOTAL: 3.6 mg/dL — ABNORMAL HIGH (ref 0.3–1.2)
BUN/CREA RATIO: 25 — ABNORMAL HIGH (ref 6–22)
BUN: 15 mg/dL (ref 7–25)
CALCIUM, CORRECTED: 9.7 mg/dL (ref 8.9–10.8)
CALCIUM: 9.9 mg/dL (ref 8.6–10.3)
CHLORIDE: 103 mmol/L (ref 98–107)
CO2 TOTAL: 28 mmol/L (ref 21–31)
CREATININE: 0.6 mg/dL (ref 0.60–1.30)
ESTIMATED GFR: 108 mL/min/{1.73_m2} (ref >59)
GLOBULIN: 4.1 (ref 2.9–5.4)
GLUCOSE: 77 mg/dL (ref 74–109)
OSMOLALITY, CALCULATED: 279 mOsm/kg (ref 270–290)
POTASSIUM: 4.1 mmol/L (ref 3.5–5.1)
PROTEIN TOTAL: 8.3 g/dL (ref 6.4–8.9)
SODIUM: 140 mmol/L (ref 136–145)

## 2022-04-16 LAB — ECG 12 LEAD
Atrial Rate: 63 {beats}/min
Calculated P Axis: 77 degrees
Calculated R Axis: 92 degrees
Calculated T Axis: 58 degrees
PR Interval: 172 ms
QRS Duration: 106 ms
QT Interval: 432 ms
QTC Calculation: 442 ms
Ventricular rate: 63 {beats}/min

## 2022-04-16 LAB — LIPASE: LIPASE: 32 U/L (ref 11–82)

## 2022-04-16 LAB — LACTIC ACID LEVEL W/ REFLEX FOR LEVEL >2.0: LACTIC ACID: 1.2 mmol/L (ref 0.5–2.2)

## 2022-04-16 LAB — TROPONIN-I: TROPONIN I: 4 ng/L (ref <15)

## 2022-04-16 LAB — C-REACTIVE PROTEIN(CRP),INFLAMMATION: C-REACTIVE PROTEIN (CRP): 0.4 mg/dL (ref 0.1–0.5)

## 2022-04-16 MED ORDER — ONDANSETRON HCL (PF) 4 MG/2 ML INJECTION SOLUTION
INTRAMUSCULAR | Status: AC
Start: 2022-04-16 — End: 2022-04-16
  Filled 2022-04-16: qty 2

## 2022-04-16 MED ORDER — PANTOPRAZOLE 40 MG INTRAVENOUS SOLUTION
INTRAVENOUS | Status: AC
Start: 2022-04-16 — End: 2022-04-16
  Filled 2022-04-16: qty 10

## 2022-04-16 MED ORDER — IOHEXOL 350 MG IODINE/ML INTRAVENOUS SOLUTION
75.0000 mL | INTRAVENOUS | Status: AC
Start: 2022-04-16 — End: 2022-04-16
  Administered 2022-04-16: 75 mL via INTRAVENOUS

## 2022-04-16 MED ORDER — LIDOCAINE HCL 2 % MUCOSAL SOLUTION
15.0000 mL | Freq: Once | Status: AC
Start: 2022-04-16 — End: 2022-04-16
  Administered 2022-04-16: 15 mL via ORAL

## 2022-04-16 MED ORDER — SODIUM CHLORIDE 0.9 % IV BOLUS
1000.0000 mL | INJECTION | Status: AC
Start: 2022-04-16 — End: 2022-04-16
  Administered 2022-04-16: 1000 mL via INTRAVENOUS
  Administered 2022-04-16: 0 mL via INTRAVENOUS

## 2022-04-16 MED ORDER — HYDROMORPHONE 2 MG/ML INJECTION WRAPPER
0.5000 mg | INJECTION | INTRAMUSCULAR | Status: AC
Start: 2022-04-16 — End: 2022-04-16
  Administered 2022-04-16: 0.5 mg via INTRAVENOUS

## 2022-04-16 MED ORDER — HYDROMORPHONE 2 MG/ML INJECTION WRAPPER
INJECTION | INTRAMUSCULAR | Status: AC
Start: 2022-04-16 — End: 2022-04-16
  Filled 2022-04-16: qty 1

## 2022-04-16 MED ORDER — ALUMINUM-MAG HYDROXIDE-SIMETHICONE 200 MG-200 MG-20 MG/5 ML ORAL SUSP
30.0000 mL | Freq: Once | ORAL | Status: AC
Start: 2022-04-16 — End: 2022-04-16
  Administered 2022-04-16: 30 mL via ORAL

## 2022-04-16 MED ORDER — HYOSCYAMINE 0.125 MG/5 ML ORAL ELIXIR
10.0000 mL | ORAL_SOLUTION | Freq: Once | ORAL | Status: DC
Start: 2022-04-16 — End: 2022-04-16
  Administered 2022-04-16: 0 mL via ORAL

## 2022-04-16 MED ORDER — MORPHINE 4 MG/ML INJECTION WRAPPER
INJECTION | INTRAMUSCULAR | Status: AC
Start: 2022-04-16 — End: 2022-04-16
  Filled 2022-04-16: qty 1

## 2022-04-16 MED ORDER — MORPHINE 4 MG/ML INJECTION WRAPPER
4.0000 mg | INJECTION | INTRAMUSCULAR | Status: AC
Start: 2022-04-16 — End: 2022-04-16
  Administered 2022-04-16: 4 mg via INTRAVENOUS

## 2022-04-16 MED ORDER — LIDOCAINE HCL 2 % MUCOSAL SOLUTION
Status: AC
Start: 2022-04-16 — End: 2022-04-16
  Filled 2022-04-16: qty 15

## 2022-04-16 MED ORDER — OXYCODONE 5 MG TABLET
5.0000 mg | ORAL_TABLET | Freq: Four times a day (QID) | ORAL | 0 refills | Status: AC | PRN
Start: 2022-04-16 — End: 2022-04-19

## 2022-04-16 MED ORDER — PANTOPRAZOLE 40 MG INTRAVENOUS SOLUTION
40.0000 mg | INTRAVENOUS | Status: AC
Start: 2022-04-16 — End: 2022-04-16
  Administered 2022-04-16: 40 mg via INTRAVENOUS

## 2022-04-16 MED ORDER — ALUMINUM-MAG HYDROXIDE-SIMETHICONE 200 MG-200 MG-20 MG/5 ML ORAL SUSP
ORAL | Status: AC
Start: 2022-04-16 — End: 2022-04-16
  Filled 2022-04-16: qty 30

## 2022-04-16 MED ORDER — DICYCLOMINE 20 MG TABLET
20.0000 mg | ORAL_TABLET | Freq: Four times a day (QID) | ORAL | 0 refills | Status: DC | PRN
Start: 2022-04-16 — End: 2022-06-13

## 2022-04-16 MED ORDER — ONDANSETRON HCL (PF) 4 MG/2 ML INJECTION SOLUTION
4.0000 mg | INTRAMUSCULAR | Status: AC
Start: 2022-04-16 — End: 2022-04-16
  Administered 2022-04-16: 4 mg via INTRAVENOUS

## 2022-04-16 NOTE — ED Triage Notes (Signed)
Has been on high dose steroids, abdominal pain since 2000, worsening.

## 2022-04-16 NOTE — ED Nurses Note (Signed)
Patient discharged home with family.  AVS reviewed with patient.  A written copy of the AVS and discharge instructions was given to the patient.  Questions sufficiently answered as needed.  Patient encouraged to follow up with PCP as indicated.  In the event of an emergency, patient instructed to call 911 or go to the nearest emergency room.

## 2022-04-16 NOTE — Discharge Instructions (Addendum)
Take Prilosec 40 mg twice daily while taking prednisone.    Bentyl 20 mg every 8 hours as needed for abdominal pain and/or cramping.    Return to the emergency department if worse and as needed.    Follow up with Dr. Jake Shark as soon as possible.

## 2022-04-16 NOTE — ED Attending Note (Signed)
[facility]  Emergency Department  Attending Provider Note      CHIEF COMPLAINT  Chief Complaint   Patient presents with    Abdominal Pain     HISTORY OF PRESENT ILLNESS  Taylor Smith, date of birth [birth year], is a 51 y.o. female who presented to the Emergency Department with upper abdominal pain.  The patient states she has renal cancer and is currently taking immunotherapy.  She states her liver enzymes have been elevated and her oncologist started prednisone 100 mg daily.  She states while taking the prednisone she has forgotten to take her daily Prilosec.  She states her abdominal pain became severe last night after eating pizza.    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)     Kidney cancer, primary, with metastasis from kidney to other site, left (CMS [facility])        Past Surgical History:   Procedure Laterality Date    HX CESAREAN SECTION      HX CHOLECYSTECTOMY      HX CYST REMOVAL Right     HX HYSTERECTOMY      TOTAL THYROIDECTOMY         Family Medical History:       Problem Relation (Age of Onset)    Cancer Maternal Aunt, Maternal Grandmother    Colon Cancer Maternal Uncle    Lung Cancer Mother    Melanoma Maternal Aunt          Social History     Socioeconomic History    Marital status: Married   Tobacco Use    Smoking status: Never    Smokeless tobacco: Never   Vaping Use    Vaping Use: Never used   Substance and Sexual Activity    Alcohol use: Never    Drug use: Never    Sexual activity: Yes     Social Determinants of Health     Intimate Partner Violence: High Risk (01/13/2022)    Intimate Partner Violence     SDOH Domestic Violence: No      ALLERGIES  No Known Allergies    PHYSICAL EXAM  VITAL SIGNS:  Filed Vitals:    04/16/22 0545 04/16/22 0600 04/16/22 0615 04/16/22 0630   BP: (!)  153/79 134/78 (!) 140/74 (!) 147/80   Pulse:       Resp:       Temp:       SpO2: 96% 93%  90%     GENERAL: PATIENT IS ALERT AND ORIENTED TO PERSON, PLACE, AND TIME.  HEAD: NORMOCEPHALIC AND ATRAUMATIC.  EYES: PUPILS EQUALLY ROUND AND REACT TO LIGHT. EXTRAOCULAR MOVEMENTS INTACT.  EARS: GROSS HEARING INTACT. EXTERNAL EARS WITHIN NORMAL LIMITS.  NOSE: NO SEPTAL DEVIATION. NASAL PASSAGES CLEAR.  THROAT: MOIST ORAL MUCOSA. NO ERYTHEMA OR EXUDATE OF THE PHARYNX.  NECK: SUPPLE. TRACHEA MIDLINE.  CARDIOVASCULAR: REGULAR RATE, AND RHYTHM. NO MURMUR.  LUNGS: CLEAR TO AUSCULTATION BILATERAL.  ABDOMEN: SOFT, NON-DISTENDED, AND BOWEL SOUNDS ARE PRESENT.  Epigastric tenderness  GENITOURINARY: DEFERRED.  RECTAL: DEFERRED.  EXTREMITIES: NO CYANOSIS, CLUBBING, OR EDEMA.  SKIN: WARM AND DRY.  NEUROLOGIC: CRANIAL NERVES II THROUGH XII ARE GROSSLY INTACT. MOVES ALL 4 EXTREMITIES.  PSYCHIATRIC: JUDGMENT AND INSIGHT ARE SEEMINGLY INTACT. MOOD AND AFFECT ARE APPROPRIATE FOR THE SITUATION.  DIAGNOSTICS  Labs:  Labs listed below were reviewed and interpreted by me.  Results for orders placed or performed during the hospital encounter of 04/16/22   C-REACTIVE PROTEIN(CRP),INFLAMMATION   Result Value Ref Range    C-REACTIVE PROTEIN (CRP) 0.4 0.1 - 0.5 mg/dL   COMPREHENSIVE METABOLIC PANEL, NON-FASTING   Result Value Ref Range    SODIUM 140 136 - 145 mmol/L    POTASSIUM 4.1 3.5 - 5.1 mmol/L    CHLORIDE 103 98 - 107 mmol/L    CO2 TOTAL 28 21 - 31 mmol/L    ANION GAP 9 4 - 13 mmol/L    BUN 15 7 - 25 mg/dL    CREATININE 0.60 0.60 - 1.30 mg/dL    BUN/CREA RATIO 25 (H) 6 - 22    ESTIMATED GFR 108 >59 mL/min/1.38m2    ALBUMIN 4.2 3.5 - 5.7 g/dL    CALCIUM 9.9 8.6 - 10.3 mg/dL    GLUCOSE 77 74 - 109 mg/dL    ALKALINE PHOSPHATASE 189 (H) 34 - 104 U/L    ALT (SGPT) 578 (H) 7 - 52 U/L    AST (SGOT) 246 (H) 13 - 39 U/L    BILIRUBIN TOTAL 3.6 (H) 0.3 - 1.2 mg/dL    PROTEIN TOTAL 8.3 6.4 - 8.9 g/dL    ALBUMIN/GLOBULIN RATIO 1.0 0.8 - 1.4    OSMOLALITY,  CALCULATED 279 270 - 290 mOsm/kg    CALCIUM, CORRECTED 9.7 8.9 - 10.8 mg/dL    GLOBULIN 4.1 2.9 - 5.4   LACTIC ACID LEVEL W/ REFLEX FOR LEVEL >2.0   Result Value Ref Range    LACTIC ACID 1.2 0.5 - 2.2 mmol/L   LIPASE   Result Value Ref Range    LIPASE 32 11 - 82 U/L   TROPONIN-I   Result Value Ref Range    TROPONIN I 4 <15 ng/L   CBC WITH DIFF   Result Value Ref Range    WBC 13.7 (H) 3.8 - 11.8 x10^3/uL    RBC 5.93 (H) 3.63 - 4.92 x10^6/uL    HGB 17.4 (H) 10.9 - 14.3 g/dL    HCT 52.4 (H) 31.2 - 41.9 %    MCV 88.3 75.5 - 95.3 fL    MCH 29.4 24.7 - 32.8 pg    MCHC 33.3 32.3 - 35.6 g/dL    RDW 17.2 12.3 - 17.7 %    PLATELETS 132 (L) 140 - 440 x10^3/uL    MPV 9.9 7.9 - 10.8 fL    NEUTROPHIL % 84 (H) 43 - 77 %    LYMPHOCYTE % 8 (L) 16 - 44 %    MONOCYTE % 8 5 - 13 %    EOSINOPHIL % 0 (L) %    BASOPHIL % 0 0 - 1 %    NEUTROPHIL # 11.50 (H) 1.85 - 7.80 x10^3/uL    LYMPHOCYTE # 1.10 1.00 - 3.00 x10^3/uL    MONOCYTE # 1.10 (H) 0.30 - 1.00 x10^3/uL    EOSINOPHIL # 0.00 0.00 - 0.50 x10^3/uL    BASOPHIL # 0.00 0.00 - 0.10 x10^3/uL   ECG 12 LEAD   Result Value Ref Range    Ventricular rate 63 BPM    Atrial Rate 63 BPM    PR Interval 172 ms    QRS Duration 106 ms    QT Interval 432 ms    QTC Calculation 442 ms    Calculated P Axis 77 degrees    Calculated R Axis 92 degrees  Calculated T Axis 58 degrees     Radiology:  Results for orders placed or performed during the hospital encounter of 04/16/22   CT ABDOMEN PELVIS W IV CONTRAST     Status: None    Narrative    Laveah A Trimarco    RADIOLOGIST: Peterson Ao    CT ABDOMEN PELVIS W IV CONTRAST performed on 04/16/2022 6:51 AM    CLINICAL HISTORY: upper abdominal pain, known renal cancer.  EPIGASTRIC ABD PAIN, NAUSEA, H/O RENAL CA WITH IMMUNOTHERAPY, CHOLECYSTECTOMY, HYSTERECTOMY    TECHNIQUE:  Abdomen and pelvis CT with intravenous contrast.  IV CONTRAST: 75 ml's of Omnipaque 350    COMPARISON:  04/09/2022  # of known CTs in the past 12 months:  4   # of known Cardiac Nuclear  Medicine Studies in the past 12 months:  0    FINDINGS:  Lung bases: Mild bibasilar atelectasis/scarring. Known metastatic lung lesions are again seen with the largest in the left lower lobe measuring 2.3 cm.    Liver:   Unremarkable.    Gallbladder:   Surgically absent.    Spleen:   Stable mild splenomegaly    Pancreas:   Unremarkable.    Adrenals:   The left adrenal gland is again not well-visualized. The right adrenal gland is unremarkable.    Kidneys:   Redemonstration of the known large centrally necrotic left renal malignancy involving the mid and upper pole measuring approximately 19 x 10 x 14 cm. Probable thrombus is again seen within portions of the left renal vein near the hilum but does not appear to extend to the level of the IVC.    The right kidney is unremarkable.    Bladder:  Unremarkable.    Uterus and Adnexa:  Prior hysterectomy.  Adnexal regions are unremarkable.    Bowel:   Unremarkable.    Appendix:  No evidence of appendicitis    Lymph nodes:  No significant interval change in the upper abdominal lymph nodes in the common hepatic and portacaval regions as well as numerous retroperitoneal lymph nodes.    Vasculature:   Redemonstration of numerous large varices in the left upper abdomen surrounding the known renal malignancy. 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     Peritoneum / Retroperitoneum: No ascites.  No free air. Redemonstration of a small amount of loculated appearing fluid surrounding the left kidney.    Bones:   Degenerative changes of the spine.        Impression    NO ACUTE FINDINGS IN THE ABDOMEN/PELVIS.    REDEMONSTRATION OF THE KNOWN LARGE CENTRALLY NECROTIC LEFT RENAL MALIGNANCY WITH EXTENSIVE ADJACENT VARICES AND LOCULATED FLUID. 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 PROBABLE THROMBUS WITHIN THE MAIN LEFT RENAL VEIN IS ALSO AGAIN NOTED.    NO SIGNIFICANT CHANGE IN THE UPPER ABDOMINAL/RETROPERITONEAL  ADENOPATHY    NONVISUALIZATION OF THE LEFT ADRENAL GLAND.    STABLE MILD SPLENOMEGALY     REDEMONSTRATION OF KNOWN METASTATIC LUNG DISEASE      One or more dose reduction techniques were used (e.g., Automated exposure control, adjustment of the mA and/or kV according to patient size, use of iterative reconstruction technique).      Radiologist location ID: [id number]         ED COURSE/MEDICAL DECISION MAKING  Medications Administered in the ED   aluminum-magnesium hydroxide-simethicone (MAG-AL PLUS) 200-200-20 mg per 5 mL oral liquid (30 mL Oral Given 04/16/22 0515)  And   hyoscyamine (HYOSYNE) 0.125 mg per 5 mL oral liquid (0 mL Oral Not Given 04/16/22 0516)     And   lidocaine (XYLOCAINE) 2% oral topical viscous solution (15 mL Oral Given 04/16/22 0515)   morphine 4 mg/mL injection (4 mg Intravenous Given 04/16/22 0459)   ondansetron (ZOFRAN) 2 mg/mL injection (4 mg Intravenous Given 04/16/22 0459)   pantoprazole (PROTONIX) injection (40 mg Intravenous Given 04/16/22 0459)   NS bolus infusion 1,000 mL (0 mL Intravenous Stopped 04/16/22 0529)   HYDROmorphone (DILAUDID) 2 mg/mL injection (0.5 mg Intravenous Given 04/16/22 0600)   iohexol (OMNIPAQUE 350) infusion (75 mL Intravenous Given 04/16/22 5400)          Medical Decision Making  Patient presents to the ED with significant upper abdominal pain.  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  There is lung Mets.  The patient states her pain began this evening after eating pizza.  She states the pain is intermittent but severe when present.  She denies nausea vomiting.  Labs were obtained that unremarkable.  A CT was obtained that does not have any new findings.  Patient was medicated in the ED and now feels much better and wants to go home.  She will be given a prescription for Bentyl and oxycodone.  The patient states she can not take acetaminophen secondary to elevated liver enzymes and can not take ibuprofen.  She will also  be given a prescription for Prilosec to take twice daily while taking prednisone.        CLINICAL IMPRESSION  Clinical Impression   Abdominal pain, unspecified abdominal location (Primary)     DISPOSITION  Discharged       DISCHARGE MEDICATIONS  Current Discharge Medication List        START taking these medications    Details   dicyclomine (BENTYL) 20 mg Oral Tablet Take 1 Tablet (20 mg total) by mouth Four times a day as needed  Qty: 30 Tablet, Refills: 0      oxyCODONE (ROXICODONE) 5 mg Oral Tablet Take 1 Tablet (5 mg total) by mouth Every 6 hours as needed for Pain for up to 3 days  Qty: 12 Tablet, Refills: 0             Quita Skye Sabra Heck D.O.   04/16/2022, 04:37   [facility]  Department of Emergency Medicine  [facility]    -----

## 2022-04-18 ENCOUNTER — Encounter (HOSPITAL_COMMUNITY): Payer: Self-pay | Admitting: HEMATOLOGY-ONCOLOGY

## 2022-04-18 ENCOUNTER — Ambulatory Visit
Admission: RE | Admit: 2022-04-18 | Discharge: 2022-04-18 | Disposition: A | Discharge status: Home / Self Care | Payer: 59 | Source: Ambulatory Visit | Attending: HEMATOLOGY-ONCOLOGY | Admitting: HEMATOLOGY-ONCOLOGY

## 2022-04-18 ENCOUNTER — Ambulatory Visit (HOSPITAL_BASED_OUTPATIENT_CLINIC_OR_DEPARTMENT_OTHER): Payer: 59 | Admitting: HEMATOLOGY-ONCOLOGY

## 2022-04-18 ENCOUNTER — Other Ambulatory Visit: Payer: Self-pay

## 2022-04-18 VITALS — BP 131/70 | HR 72 | Temp 98.1°F | Ht 69.0 in | Wt 206.6 lb

## 2022-04-18 DIAGNOSIS — Z808 Family history of malignant neoplasm of other organs or systems: Secondary | ICD-10-CM | POA: Insufficient documentation

## 2022-04-18 DIAGNOSIS — I8290 Acute embolism and thrombosis of unspecified vein: Secondary | ICD-10-CM | POA: Insufficient documentation

## 2022-04-18 DIAGNOSIS — C642 Malignant neoplasm of left kidney, except renal pelvis: Secondary | ICD-10-CM

## 2022-04-18 DIAGNOSIS — Z809 Family history of malignant neoplasm, unspecified: Secondary | ICD-10-CM | POA: Insufficient documentation

## 2022-04-18 DIAGNOSIS — I823 Embolism and thrombosis of renal vein: Secondary | ICD-10-CM | POA: Insufficient documentation

## 2022-04-18 DIAGNOSIS — Z800 Family history of malignant neoplasm of digestive organs: Secondary | ICD-10-CM | POA: Insufficient documentation

## 2022-04-18 DIAGNOSIS — Z801 Family history of malignant neoplasm of trachea, bronchus and lung: Secondary | ICD-10-CM | POA: Insufficient documentation

## 2022-04-18 LAB — CBC WITH DIFF
BASOPHIL #: 0 10*3/uL (ref 0.00–0.10)
BASOPHIL %: 0 % (ref 0–1)
EOSINOPHIL #: 0.1 10*3/uL (ref 0.00–0.50)
EOSINOPHIL %: 2 %
HCT: 52.2 % — ABNORMAL HIGH (ref 31.2–41.9)
HGB: 17 g/dL — ABNORMAL HIGH (ref 10.9–14.3)
LYMPHOCYTE #: 0.4 10*3/uL — ABNORMAL LOW (ref 1.00–3.00)
LYMPHOCYTE %: 7 % — ABNORMAL LOW (ref 16–44)
MCH: 28.6 pg (ref 24.7–32.8)
MCHC: 32.6 g/dL (ref 32.3–35.6)
MCV: 87.5 fL (ref 75.5–95.3)
MONOCYTE #: 0.4 10*3/uL (ref 0.30–1.00)
MONOCYTE %: 7 % (ref 5–13)
MPV: 9.6 fL (ref 7.9–10.8)
NEUTROPHIL #: 5.5 10*3/uL (ref 1.85–7.80)
NEUTROPHIL %: 84 % — ABNORMAL HIGH (ref 43–77)
PLATELETS: 119 10*3/uL — ABNORMAL LOW (ref 140–440)
RBC: 5.96 10*6/uL — ABNORMAL HIGH (ref 3.63–4.92)
RDW: 17 % (ref 12.3–17.7)
WBC: 6.5 10*3/uL (ref 3.8–11.8)

## 2022-04-18 LAB — COMPREHENSIVE METABOLIC PANEL, NON-FASTING
ALBUMIN/GLOBULIN RATIO: 1 (ref 0.8–1.4)
ALBUMIN: 3.7 g/dL (ref 3.5–5.7)
ALKALINE PHOSPHATASE: 170 U/L — ABNORMAL HIGH (ref 34–104)
ALT (SGPT): 438 U/L — ABNORMAL HIGH (ref 7–52)
ANION GAP: 7 mmol/L (ref 4–13)
AST (SGOT): 264 U/L — ABNORMAL HIGH (ref 13–39)
BILIRUBIN TOTAL: 3.3 mg/dL — ABNORMAL HIGH (ref 0.3–1.2)
BUN/CREA RATIO: 14 (ref 6–22)
BUN: 9 mg/dL (ref 7–25)
CALCIUM, CORRECTED: 9.4 mg/dL (ref 8.9–10.8)
CALCIUM: 9.2 mg/dL (ref 8.6–10.3)
CHLORIDE: 102 mmol/L (ref 98–107)
CO2 TOTAL: 27 mmol/L (ref 21–31)
CREATININE: 0.63 mg/dL (ref 0.60–1.30)
ESTIMATED GFR: 107 mL/min/{1.73_m2} (ref >59)
GLOBULIN: 3.6 (ref 2.9–5.4)
GLUCOSE: 134 mg/dL — ABNORMAL HIGH (ref 74–109)
OSMOLALITY, CALCULATED: 273 mOsm/kg (ref 270–290)
POTASSIUM: 3.9 mmol/L (ref 3.5–5.1)
PROTEIN TOTAL: 7.3 g/dL (ref 6.4–8.9)
SODIUM: 136 mmol/L (ref 136–145)

## 2022-04-18 MED ORDER — APIXABAN 5 MG TABLET
5.0000 mg | ORAL_TABLET | Freq: Two times a day (BID) | ORAL | 5 refills | Status: DC
Start: 2022-04-18 — End: 2022-04-28

## 2022-04-18 MED ORDER — PAZOPANIB 200 MG TABLET
800.0000 mg | ORAL_TABLET | Freq: Every day | ORAL | 5 refills | Status: DC
Start: 2022-04-18 — End: 2022-05-08

## 2022-04-18 NOTE — Nurses Notes (Addendum)
0955 Patient arrived to unit for lab work. She also has an appointment with Dr. Jake Shark today.Judge Stall, RN  1023 Labs collected via venipuncture. Judge Stall, RN  [phone] Patient left unit ambulatory. Judge Stall, RN

## 2022-04-18 NOTE — Progress Notes (Unsigned)
Department of Hematology/Oncology  History and Physical    Name: Taylor Smith  HDQ:[id number]  Date of Birth: [birth year]  Encounter Date: 04/18/2022    REFERRING PROVIDER:  No referring provider defined for this encounter.    REASON FOR OFFICE VISIT:  New patient for evaluation and management of stage IV clear cell renal carcinoma    HISTORY OF PRESENT ILLNESS:A  Taylor Smith is a 51 y.o. 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  She was originally diagnosed after having a biopsy in the genitourinary area.  It came back consistent with clear cell renal cell carcinoma, which prompted an imaging study of the chest, abdomen, and pelvis.  She was found to have a massive left kidney mass with numerous small pulmonary nodules.  The renal mass appears to be involving the renal vein but not the inferior vena cava.    Prior to this, she was not having any symptoms.  Specifically, she did not have any hematuria or history of iron-deficiency anemia.  She does report back pain which has been going on for the past year but it is unclear whether that was related or not.    02/07/2022: The patient is here for follow up of stage IV kidney cancer.  She states that she tolerated the 1st treatment well.  She did have some achiness of the legs which was pretty brief in nature.    02/28/2022: The patient is here for follow up of stage IV kidney cancer.  She states that she has some pain in her legs for a few days following treatment.  She also reports that she is had a cough which has been present since her most recent surgery.  Today will be her 3rd cycle of treatment, and she has a CT scan scheduled for December 20.    03/21/2022:  The patient is here for follow up of metastatic renal cell carcinoma.  She was due for day 1 of cycle 4 of combination ipilimumab and nivolumab.  She was having some tolerability issues with the combination, but is  receiving additional premedications and states that it is helping to make the treatments easier.  She does not have any new issues at this time.    04/11/2022: The patient is here for follow up of metastatic renal cell cancer.  She has been on treatment with combination immunotherapy.  At last contact, her transaminases were mildly elevated.  On this visit, her bilirubin and transaminases are markedly higher.  She states that she did see a surgeon yesterday for consideration of nephrectomy.  She indicates that there was not a discussion regarding a jaundiced appearance at that time.  She had CT imaging performed approximately 2 days ago.    04/18/2022: The patient is here for follow up of metastatic renal cell carcinoma.  Immunotherapy has been on hold due to adverse liver function testing.  She has been on prednisone in her numbers have improved considerably.  She did go to the emergency room for an episode of abdominal pain recently and was prescribed Bentyl.    ROS:   Review of Systems   Constitutional:  Negative for appetite change, chills and fatigue.   HENT:   Negative for sore throat and trouble swallowing.    Eyes:  Negative for eye problems.   Respiratory:  Negative for cough and shortness of breath.    Cardiovascular:  Negative for chest pain and leg swelling.  Gastrointestinal:  Negative for abdominal pain.   Genitourinary:  Negative for dysuria and hematuria.    Musculoskeletal:  Negative for arthralgias and gait problem.   Skin:  Negative for rash.   Neurological:  Negative for gait problem.   Hematological:  Negative for adenopathy.   Psychiatric/Behavioral:  Negative for depression.         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)     Kidney cancer, primary, with metastasis from kidney to other site, left (CMS [facility])          Past  Surgical History:   Procedure Laterality Date    HX CESAREAN SECTION      HX CHOLECYSTECTOMY      HX CYST REMOVAL Right     HX HYSTERECTOMY      TOTAL THYROIDECTOMY           Social History     Socioeconomic History    Marital status: Married     Spouse name: Not on file    Number of children: Not on file    Years of education: Not on file    Highest education level: Not on file   Occupational History    Not on file   Tobacco Use    Smoking status: Never    Smokeless tobacco: Never   Vaping Use    Vaping Use: Never used   Substance and Sexual Activity    Alcohol use: Never    Drug use: Never    Sexual activity: Yes   Other Topics Concern    Not on file   Social History Narrative    Not on file     Social Determinants of Health     Financial Resource Strain: Not on file   Transportation Needs: Not on file   Social Connections: Not on file   Intimate Partner Violence: High Risk (01/13/2022)    Intimate Partner Violence     SDOH Domestic Violence: No   Housing Stability: Not on file     Family Medical History:       Problem Relation (Age of Onset)    Cancer Maternal Aunt, Maternal Grandmother    Colon Cancer Maternal Uncle    Lung Cancer Mother    Melanoma Maternal Aunt            Current Outpatient Medications   Medication Sig    Benzonatate (TESSALON) 200 mg Oral Capsule Take 1 Capsule (200 mg total) by mouth Three times a day    cloNIDine HCL (CATAPRES) 0.1 mg Oral Tablet Take 1 Tablet (0.1 mg total) by mouth Once a day    dicyclomine (BENTYL) 20 mg Oral Tablet Take 1 Tablet (20 mg total) by mouth Four times a day as needed    ergocalciferol, vitamin D2, (DRISDOL) 1,250 mcg (50,000 unit) Oral Capsule Take 1 Capsule (50,000 Units total) by mouth Every 7 days    HYDROcodone-acetaminophen (NORCO) 5-325 mg Oral Tablet Take 2 Tablets by mouth Every 4 hours as needed for Pain    Ibuprofen (MOTRIN) 600 mg Oral Tablet Take 1 Tablet (600 mg total) by mouth Every 6 hours as needed    levothyroxine (SYNTHROID) 112 mcg Oral  Tablet Take 1 Tablet (112 mcg total) by mouth Every morning    levothyroxine (SYNTHROID) 125 mcg Oral Tablet  TAKE 1 TABLET (125 MCG TOTAL) BY MOUTH ONCE A DAY FOR 90 DAYS    lisinopriL (PRINIVIL) 40 mg Oral Tablet TAKE 1 TABLET BY MOUTH EVERY DAY (Patient taking differently: Take 1 Tablet (40 mg total) by mouth Once a day)    omeprazole (PRILOSEC) 40 mg Oral Capsule, Delayed Release(E.C.) Take 1 Capsule (40 mg total) by mouth Once a day    ondansetron (ZOFRAN ODT) 8 mg Oral Tablet, Rapid Dissolve Place 1 Tablet (8 mg total) under the tongue Every 8 hours as needed for Nausea/Vomiting    oxyCODONE (ROXICODONE) 5 mg Oral Tablet Take 1 Tablet (5 mg total) by mouth Every 6 hours as needed for Pain for up to 3 days    predniSONE (DELTASONE) 50 mg Oral Tablet Take 2 Tablets (100 mg total) by mouth Once a day     No Known Allergies    PHYSICAL EXAM:  Most Recent Vitals    Flowsheet Row Telemedicine from 01/13/2022 in Hematology/Oncology,   [facility]   Temperature 36.7 C (98.1 F) filed at... 01/13/2022 1431   Heart Rate 72 filed at... 01/13/2022 1431   Respiratory Rate --   BP (Non-Invasive) 146/73 filed at... 01/13/2022 1431   SpO2 97 % filed at... 01/13/2022 1431   Height 1.753 m ('5\' 9"'$ ) filed at... 01/13/2022 1431   Weight 94.2 kg (207 lb 9.6 oz) filed at... 01/13/2022 1431   BMI (Calculated) 30.72 filed at... 01/13/2022 1431   BSA (Calculated) 2.14 filed at... 01/13/2022 1431      ECOG Status: (0) Fully active, able to carry on all predisease performance without restriction   Physical Exam  Constitutional:       General: She is not in acute distress.     Appearance: Normal appearance.   Eyes:      Extraocular Movements: Extraocular movements intact.   Cardiovascular:      Rate and Rhythm: Normal rate and regular rhythm.   Pulmonary:      Effort: Pulmonary effort is normal.   Abdominal:      General: Abdomen is flat.      Palpations: Abdomen is soft.   Musculoskeletal:         General: Normal range of  motion.      Cervical back: Normal range of motion.   Skin:     General: Skin is warm and dry.   Neurological:      General: No focal deficit present.      Mental Status: She is alert.   Psychiatric:         Mood and Affect: Mood normal.         DIAGNOSTIC DATA:  No results found for this or any previous visit (from the past 17520 hour(s)).    LABS:   CBC  Diff   Lab Results   Component Value Date/Time    WBC 6.5 04/18/2022 10:23 AM    HGB 17.0 (H) 04/18/2022 10:23 AM    HCT 52.2 (H) 04/18/2022 10:23 AM    PLTCNT 119 (L) 04/18/2022 10:23 AM    RBC 5.96 (H) 04/18/2022 10:23 AM    MCV 87.5 04/18/2022 10:23 AM    MCHC 32.6 04/18/2022 10:23 AM    MCH 28.6 04/18/2022 10:23 AM    RDW 17.0 04/18/2022 10:23 AM    MPV 9.6 04/18/2022 10:23 AM    Lab Results   Component Value Date/Time    PMNS 84 (H) 04/18/2022 10:23 AM    LYMPHOCYTES 7 (L) 04/18/2022  10:23 AM    EOSINOPHIL 2 04/18/2022 10:23 AM    MONOCYTES 7 04/18/2022 10:23 AM    BASOPHILS 0 04/18/2022 10:23 AM    BASOPHILS 0.00 04/18/2022 10:23 AM    PMNABS 5.50 04/18/2022 10:23 AM    LYMPHSABS 0.40 (L) 04/18/2022 10:23 AM    EOSABS 0.10 04/18/2022 10:23 AM    MONOSABS 0.40 04/18/2022 10:23 AM            ASSESSMENT:    ICD-10-CM    1. Kidney cancer, primary, with metastasis from kidney to other site, left (CMS HCC)  C64.2 COMPREHENSIVE METABOLIC PANEL, NON-FASTING     CBC/DIFF           PLAN:   1. All relevant medical records were reviewed including available pertinent provider notes, procedure notes, imaging, laboratory, and pathology.   2. All pertinent labs and/or imaging were reviewed with the patient.   3. Stage IV renal carcinoma:  She would significant deterioration of liver function tests with combination immunotherapy.  We will discontinue that plan and switch to a tyrosine kinase inhibitor, pazopanib.  I will send in a prescription to the specialty pharmacy and we reviewed the potential side effects and she is agreeable to proceeding.  We discussed that  combination TKI/immunotherapy may be considered in the future if her numbers continue to improve.  She states that there is still some discussion about whether to perform a nephrectomy for the primary site of involvement.    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    Taylor Smith was given the chance to ask questions, and these were answered to their satisfaction. The patient is welcome to call with any questions or concerns in the meantime.     On the day of the encounter, a total of 45 minutes was spent on this patient encounter including review of historical information, examination, documentation and post-visit activities.   Return in about 4 weeks (around 05/16/2022).     Narda Rutherford, MD  04/18/2022, 10:49  The patient's insurance company bears full legal and financial responsibility resulting from any deviations that they cause to my recommended treatment plan.   CC:  Eleanora Neighbor, FNP-C  [address]  [place] [address]    No referring provider defined for this encounter.    This note was partially generated using MModal Fluency Direct system, and there may be some incorrect words, spellings, and punctuation that were not noted in checking the note before saving.

## 2022-04-22 ENCOUNTER — Telehealth (INDEPENDENT_AMBULATORY_CARE_PROVIDER_SITE_OTHER): Payer: Self-pay | Admitting: NURSE PRACTITIONER

## 2022-04-22 DIAGNOSIS — C642 Malignant neoplasm of left kidney, except renal pelvis: Secondary | ICD-10-CM

## 2022-04-22 NOTE — Telephone Encounter (Signed)
PT NEEDS REFERRAL TO [facility] [facility] [phone] FOR KIDNEY CANCER, She IS WANTING TO CHANGE PROVIDERS FOR THIS  THANKS MELISSA W

## 2022-04-23 ENCOUNTER — Other Ambulatory Visit (HOSPITAL_BASED_OUTPATIENT_CLINIC_OR_DEPARTMENT_OTHER): Payer: Self-pay | Admitting: Urology

## 2022-04-23 NOTE — Progress Notes (Signed)
MULTIDISCIPLINARY GENITOURINARY TUMOR BOARD DISCUSSION:    PATIENT:   Taylor Smith NUMBER:   [id number]  DOB:    1970-06-11  AGE:   52 y.o.  DATE:   04/29/2022    REFERRING PROVIDER: No ref. provider found  PCP: Eleanora Neighbor, FNP-C    PRESENTER: Glendora Score, MD  TYPE OF PRESENTATION: Prospective  PATHOLOGY REVIEWED AT [facility]?: Yes  RADIOGRAPHS REVIEWED AT [facility]?: Yes  NATIONAL GUIDELINES DISCUSSED?: Yes; NCCN    DIAGNOSIS: Metastatic ccRCC  DIAGNOSIS LOCATION: Outside Facility  DIAGNOSIS METHOD: Biopsy/Imaging  STAGE: Stage IV, cT4, N0, M1  RECURRENT?: No    FAMILY HISTORY?: Not Significant; Maternal uncle w/ colon cancer, maternal aunt w/ melanoma, mother w/ lung cancer  GENETIC TESTING?: No  PROGNOSTIC INDICATORS DISCUSSED: Yes    CLINICAL TRIAL PARTICIPATION: No  ELIGIBLE CLINICAL TRIALS: No    NEED FOR PALLIATIVE CARE?:  No  PSYCHOSOCIAL CONCERNS?:  No  REHABILITATION CONCERNS?:  No  NUTRITIONAL CONCERNS?:  No    PATIENT NAVIGATOR DISCUSSED OUTREACH ACTIVITIES/PROGRAMS?:  No  PATIENT NAVIGATOR PROVIDED EDUCATIONAL MATERIAL TO PATIENT?:  No    THE PATIENT'S MOST RECENT CLINICAL INFORMATION, IMAGING, AND PATHOLOGY WAS DISCUSSED TODAY AT [place] MULTIDISCIPLINARY GENITOURINARY TUMOR BOARD BY THE MEDICAL ONCOLOGY, RADIATION ONCOLOGY, SURGERY, RADIOLOGY, PATHOLOGY, SOCIAL SERVICES, GENETIC, REHAB, AND NURSING SERVICES.    PRELIMINARY RECOMMENDATIONS BASED ON TODAY'S TUMOR BOARD DISCUSSION:   -Not a surgical candidate  -Continue systemic therapy  -Repeat imaging in 3-4 months    York Pellant 04/29/2022 10:46  Oncology Tumor Board Coordinator

## 2022-04-24 ENCOUNTER — Other Ambulatory Visit (HOSPITAL_BASED_OUTPATIENT_CLINIC_OR_DEPARTMENT_OTHER): Payer: Self-pay | Admitting: Family

## 2022-04-24 NOTE — Progress Notes (Signed)
Multidisciplinary Urologic Tumor Board Note    The patient's most recent clinical information, imaging, and pathology was discussed today at [facility] Board by the Urology, Medical Oncology, Radiation Oncology, Radiology, and Pathology Services.    Taylor Smith is a 52 y.o. 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 Radiology consensus was the T3 lytic focus on CT from 04/09/22 is indeterminate. Pulmonary nodules are consistent with a partial response to therapy. Overall consensus was this patient is not a candidate for surgery at this time. It was recommended to continue systemic therapy and re-image to assess response to treatment.     Plan:  Consensus is that patient is not a candidate for cytoreductive surgery at this time given volume of disease  Continue to follow with medical oncology for systemic treatment at this time.     Leroy Kennedy, FNP-C   [facility]  Department of Urology  Division of Urologic Oncology  04/24/2022, 07:52

## 2022-04-28 ENCOUNTER — Telehealth (INDEPENDENT_AMBULATORY_CARE_PROVIDER_SITE_OTHER): Payer: Self-pay | Admitting: HEMATOLOGY-ONCOLOGY

## 2022-04-28 ENCOUNTER — Other Ambulatory Visit (HOSPITAL_COMMUNITY): Payer: Self-pay | Admitting: HEMATOLOGY-ONCOLOGY

## 2022-04-28 MED ORDER — APIXABAN 5 MG TABLET
5.0000 mg | ORAL_TABLET | Freq: Two times a day (BID) | ORAL | 5 refills | Status: DC
Start: 2022-04-28 — End: 2022-11-10

## 2022-04-28 NOTE — Telephone Encounter (Signed)
Called [employer] about patients medication "Pazopanib". [employer] stated that they had started the PA and needed office notes. Office notes sent to [employer].

## 2022-04-29 ENCOUNTER — Ambulatory Visit (HOSPITAL_BASED_OUTPATIENT_CLINIC_OR_DEPARTMENT_OTHER): Payer: Self-pay | Admitting: Urology

## 2022-04-29 NOTE — Nursing Note (Signed)
Message from Sharman Cheek sent at 04/29/2022  1:36 PM EST    "Taylor Smith" patient stating she missed Dr. Damaris Hippo call to talk about treatment plan, Requesting a return call.       Call History     Type Contact Phone/Fax User   04/29/2022 01:35 PM EST Phone (Incoming) [place], [place] (Self) [phone] Jerilynn Mages) Sharman Cheek       Message forwarded to Dr. Bertram Denver.    Roselyn Meier, RN

## 2022-05-05 ENCOUNTER — Telehealth (HOSPITAL_BASED_OUTPATIENT_CLINIC_OR_DEPARTMENT_OTHER): Payer: Self-pay | Admitting: Urology

## 2022-05-05 ENCOUNTER — Other Ambulatory Visit (HOSPITAL_BASED_OUTPATIENT_CLINIC_OR_DEPARTMENT_OTHER): Payer: Self-pay | Admitting: Urology

## 2022-05-05 DIAGNOSIS — C649 Malignant neoplasm of unspecified kidney, except renal pelvis: Secondary | ICD-10-CM

## 2022-05-05 NOTE — Telephone Encounter (Signed)
[facility]  DEPARTMENT OF UROLOGY  TELEPHONE ENCOUNTER    NAME:  Taylor Smith MRN: [id number]   DATE:  05/05/2022 AGE:   52 y.o.     REASON FOR CALL: Discuss plan of care    CALL NOTES:  I called the patient at the telephone numbers listed in the demographics section on 05/05/2022 at 09:25. Unfortunately, there was no answer. I left a message and encouraged the patient to call our office at [phone] as soon as possible.     Glendora Score, MD  Assistant Professor, Surgeon   Division of Urologic Oncology  Department of Urology   [facility] Medicine   05/05/2022 09:25

## 2022-05-06 NOTE — Patient Instructions (Addendum)
How to reach [place] after your clinic visit today:  For EMERGENCIES:   Please call 911 or go to your closest Emergency Department    For URGENT Needs :  Any sudden changes in level of consciousness/confusion/drowsiness  Pain not controlled with pain medication  Nausea/vomiting not controlled with anti-nausea medication  Diarrhea (4 or more liquid/watery stools daily) not controlled with anti-diarrheals  Fever > 100.3 or shaking chills without fever  Inability to urinate  Signs or symptoms of infection - fever, non-healing wound, burning or pain on urination, dark yellow or green sputum production, new cough    Monday - Friday 8am - 4pm (NO holidays or weekends)       Please contact The [facility] Nurse Triage line at [phone], option 6 to speak with a nurse. You will have the option to wait and speak with a nurse or leave a message for a nurse to call you back within 2 hours. This is the best way to reach your care team if the problem is urgent and needs to be addressed quickly.  After 4pm Monday - Friday, Weekends, & Holidays  Please contact the Therapist, music at [phone] and ask to page your doctor. The paging operator will contact the covering provider to address your concerns.     For NON-URGENT Needs:  You can contact the nurse triage line at [phone], option 6 or send a message via MyChart.   MyChart messages are answered by the clinic RNs who are assessing patients in clinic Monday - Friday 8 am - 4pm. We do not monitor the messages after hours or on weekends. It can take up to 72 hours to receive a response regarding your concerns. If you need is more urgent please contact the nurse triage line.   04/09/22 CT chest: Lungs/Pleura: The lungs are clear without focal consolidation. Mild subsegmental atelectasis. Linear atelectasis/scarring is noted in the left lung base.  There is overall decreased size and number of multifocal metastatic lesions throughout the lungs, for instance  subpleural anterior left and right upper lobe nodules are no longer visualized. Left lower lobe nodule previously measuring 12 mm now measures 6 mm (series 6 image 183). The largest residual lesions measure 23 mm in the left lower lobe and 5 mm in the basilar right lower lobe.     04/16/22 CT a/p:  FINDINGS:   Lung bases: Mild bibasilar atelectasis/scarring. Known metastatic lung lesions are again seen with the largest in the left lower lobe measuring 2.3 cm.   Liver:   Unremarkable.   Kidneys:   Redemonstration of the known large centrally necrotic left renal malignancy involving the mid and upper pole measuring approximately 19 x 10 x 14 cm. Probable thrombus is again seen within portions of the left renal vein near the hilum but does not appear to extend to the level of the IVC.   The right kidney is unremarkable.  Lymph nodes:  No significant interval change in the upper abdominal lymph nodes in the common hepatic and portacaval regions as well as numerous retroperitoneal lymph nodes.   Vasculature:   Redemonstration of numerous large varices in the left upper abdomen surrounding the known renal malignancy. 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

## 2022-05-07 ENCOUNTER — Ambulatory Visit (HOSPITAL_BASED_OUTPATIENT_CLINIC_OR_DEPARTMENT_OTHER): Payer: 59 | Admitting: Urology

## 2022-05-07 ENCOUNTER — Other Ambulatory Visit (HOSPITAL_COMMUNITY): Payer: Self-pay | Admitting: NURSE PRACTITIONER

## 2022-05-08 ENCOUNTER — Other Ambulatory Visit (HOSPITAL_COMMUNITY): Payer: Self-pay | Admitting: HEMATOLOGY-ONCOLOGY

## 2022-05-08 MED ORDER — AXITINIB 5 MG TABLET
5.0000 mg | ORAL_TABLET | Freq: Two times a day (BID) | ORAL | 5 refills | Status: DC
Start: 2022-05-08 — End: 2022-05-21

## 2022-05-08 NOTE — Progress Notes (Signed)
[facility]  GENITOURINARY CLINIC 5-1    REFERRING PROVIDER: Self   REFERRING PCP: Macario Golds    CHIEF COMPLAINT   Metastatic ccRCC (right kidney)    HISTORY   Taylor Smith is a 52 y.o. year old woman who presents for evaluation and consideration of treatment for metastatic ccRCC. She initially presented with a large L ccRCC with RV invasion and thrombosis and small volume lung metastases. She has responded to ipi/nivo x 4 with reduced lung metastases but developed severe transaminitis, now improving, due to immunotherapy. Her large L renal mass is still present and quite large. She was seen in the GU medical oncology clinic with Dr. Hilda Blades earlier this week with plans to start a 3 to 6 month course of pazopanib with re-staging imaging to be scheduled in 3 months with consideration of cytoreductive nephrectomy at that time.     ONCOLOGIC HISTORY     Oncology History    No history exists.        PAST MEDICAL HISTORY     Past Medical History:   Diagnosis Date   . Cancer with pulmonary metastases (CMS-HCC)    . Drug-induced hepatitis    . HTN (hypertension)    . Metastatic renal cell carcinoma, left (CMS-HCC)    . Renal vein thrombosis (CMS-HCC)    Hypothyroidism (due to surgical removal)    PAST SURGICAL HISTORY     Past Surgical History:   Procedure Laterality Date   . HYSTERECTOMY SUPRACERVICAL ABDOMINAL W/REMOVAL TUBES &/OR OVARIES     Thyroidectomy  Open cholecystectomy  Caesarian section x3    MEDICATIONS     Current Outpatient Medications   Medication Sig Dispense Refill   . cloNIDine HCL (CATAPRES) 0.1 MG tablet Take 1 tablet by mouth once daily     . ergocalciferol, vitamin D2, 1,250 mcg (50,000 unit) capsule Take 1 capsule by mouth once a week     . ibuprofen (MOTRIN) 600 MG tablet Take 600 mg by mouth every 6 (six) hours as needed     . levothyroxine (SYNTHROID) 112 MCG tablet Take 112 mcg by mouth once daily     . lisinopriL (ZESTRIL) 40 MG tablet Take 1 tablet by mouth once daily     .  omeprazole (PRILOSEC) 40 MG DR capsule Take 1 capsule by mouth once daily     . predniSONE (DELTASONE) 10 MG tablet Take 1 tablet (10 mg total) by mouth once daily Take 20 mg daily for 7 days then 10 mg daily for 7 days then stop 30 tablet 2   . benzonatate (TESSALON) 200 MG capsule Take by mouth (Patient not taking: Reported on 05/08/2022)     . ondansetron (ZOFRAN-ODT) 8 MG disintegrating tablet Place under the tongue (Patient not taking: Reported on 05/08/2022)       No current facility-administered medications for this visit.     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 Marital status: Married   Tobacco Use   . Smoking status: Never   . Smokeless tobacco: Never   Vaping Use   . 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 3 children. Extensive +FH of malignancy. No second hand smoke       FAMILY HISTORY  Family History   Problem Relation Age of Onset   . Breast cancer Mother    . Pancreatic cancer Maternal Grandmother    . Lung cancer Maternal Grandfather    . Ovarian cancer Maternal Aunt    . Melanoma Maternal Aunt    . Lung cancer Paternal Uncle      REVIEW OF SYSTEMS     Review of systems as per HPI, otherwise negative.     PHYSICAL EXAM     Vitals:    05/08/22 0918   BP: 111/73   Pulse: 79   Resp: 17   SpO2: 97%   Weight: 93 kg (205 lb 0.4 oz)   Height: 175.3 cm (5' 9.02")   Body mass index is 30.26 kg/m.Body surface area is 2.13 meters squared.     General appearance: well appearing in no acute distress  Mental status: alert and oriented to person, place, and time; normal mood and affect  HEENT: normocephalic, atraumatic, EOMI  Neck: symmetric, trachea midline   Respiratory: effort unlabored, normal excursion without intercostal retraction  Abdomen: Moderately protuberant. Soft, non-tender, non-distended  Musculoskeletal: normal range  of motion, good strength, tone, and gait  Neurological: A&O x 4, CN II-XII grossly intact    LABORATORY AND RADIOLOGY   These [facility] labs were personally reviewed:  Lab Results   Component Value Date    CREATININE 0.6 05/06/2022    ALKPHOS 114 (H) 05/06/2022     ASSESSMENT   Diagnoses and all orders for this visit:    Metastatic renal cell carcinoma, left (CMS-HCC)           PLAN   The patient presents today for discussion and management of a left renal mass. This mass is known to represent a primary site for metastatic ccRCC. There is concern for a venous tumor thrombus extending to renal vein. Current workup has identified metastatic sites. She has currently completed ipi/nivo x 4 with good response and now with plans to start a 3 to 6 month course of pazopanib.     Surgery to remove the kidney is the mainstay of treatment for a large renal mass with evidence of surrounding hilar/retroperitoneal lymphadenopathy.     In general, when a large IVC thrombus is present, an open approach is generally recommended to facilitate safe removal of the kidney and tumor thrombus, and surgery is considered up front as a means to decrease the risk of sudden death from a bland thrombus pulmonary embolism.     The perioperative details of surgery and the postoperative recovery were discussed.  Potential complications of surgery including bleeding, infection, adjacent organ injury, renal dysfunction, urine leaks, thromboembolism, myocardial infarction, and death were explained. We considered carefully the implications of the surgery in the context of their cardiac status and reviewed the importance of preoperative evaluation to confirm fitness for surgery.    Systemic therapy (immunotherapy and targeted therapy) is utilized in the case of metastatic spread and the timing of this (before or after surgery) is dependent on the patient's fitness for surgery, extent of disease, and risk factors for acute mortality/morbidity. Response rates  to systemic therapy vary and histology of the primary tumor is used to guide choice of systemic therapy. A biopsy may be required if surgery is not performed up front to manage the primary tumor.    For patients who are not candidates for surgery, we carefully consider the role of alternative treatments (e.g. radiation therapy) that may have some treatment effect with less  short term risk, noting that these treatments are not standard of care in the case of large renal masses with IVC thrombus. Historically, radiation has not been utilized in such cases, and it is not clear if such treatments represent a better balance of risk/benefit ratio compared to systemic therapy treatments.     The patient asked questions and all of them were answered.  After this discussion the patient agrees to proceed with a three month course of pazopanib with re-staging imaging scheduled for 08/05/2022. We will also discuss her case in a multi-disciplinary GU tumor board this week. We will consider CN in three months pending re-staging imaging (CT CAP and MRI abdomen).     60 minutes of total time was spent reviewing data and including face to face with the patient. Greater than 50% was spent in counseling and/or coordination of care of the patient's problems as documented.      Attestation Statement:     I personally performed the service. (TP)    MICHAEL Brunetta Genera, MD

## 2022-05-09 ENCOUNTER — Ambulatory Visit (HOSPITAL_COMMUNITY): Payer: 59

## 2022-05-09 ENCOUNTER — Other Ambulatory Visit (HOSPITAL_COMMUNITY): Payer: Self-pay | Admitting: HEMATOLOGY-ONCOLOGY

## 2022-05-10 ENCOUNTER — Encounter (HOSPITAL_COMMUNITY): Payer: Self-pay | Admitting: HEMATOLOGY-ONCOLOGY

## 2022-05-13 ENCOUNTER — Encounter (INDEPENDENT_AMBULATORY_CARE_PROVIDER_SITE_OTHER): Payer: Self-pay | Admitting: Family Medicine

## 2022-05-16 ENCOUNTER — Other Ambulatory Visit: Payer: Self-pay

## 2022-05-16 ENCOUNTER — Ambulatory Visit (HOSPITAL_COMMUNITY): Payer: 59 | Admitting: HEMATOLOGY-ONCOLOGY

## 2022-05-16 ENCOUNTER — Encounter (HOSPITAL_COMMUNITY): Payer: Self-pay | Admitting: HEMATOLOGY-ONCOLOGY

## 2022-05-16 ENCOUNTER — Ambulatory Visit
Admission: RE | Admit: 2022-05-16 | Discharge: 2022-05-16 | Disposition: A | Discharge status: Home / Self Care | Payer: 59 | Source: Ambulatory Visit | Attending: HEMATOLOGY-ONCOLOGY | Admitting: HEMATOLOGY-ONCOLOGY

## 2022-05-16 VITALS — BP 152/83 | HR 65 | Temp 97.8°F | Ht 69.0 in | Wt 211.5 lb

## 2022-05-16 DIAGNOSIS — Z008 Encounter for other general examination: Secondary | ICD-10-CM | POA: Insufficient documentation

## 2022-05-16 DIAGNOSIS — Z7901 Long term (current) use of anticoagulants: Secondary | ICD-10-CM | POA: Insufficient documentation

## 2022-05-16 DIAGNOSIS — C642 Malignant neoplasm of left kidney, except renal pelvis: Secondary | ICD-10-CM

## 2022-05-16 DIAGNOSIS — Z808 Family history of malignant neoplasm of other organs or systems: Secondary | ICD-10-CM | POA: Insufficient documentation

## 2022-05-16 DIAGNOSIS — Z801 Family history of malignant neoplasm of trachea, bronchus and lung: Secondary | ICD-10-CM | POA: Insufficient documentation

## 2022-05-16 DIAGNOSIS — Z800 Family history of malignant neoplasm of digestive organs: Secondary | ICD-10-CM | POA: Insufficient documentation

## 2022-05-16 DIAGNOSIS — Z86718 Personal history of other venous thrombosis and embolism: Secondary | ICD-10-CM | POA: Insufficient documentation

## 2022-05-16 DIAGNOSIS — K754 Autoimmune hepatitis: Secondary | ICD-10-CM | POA: Insufficient documentation

## 2022-05-16 DIAGNOSIS — Z809 Family history of malignant neoplasm, unspecified: Secondary | ICD-10-CM | POA: Insufficient documentation

## 2022-05-16 LAB — COMPREHENSIVE METABOLIC PANEL, NON-FASTING
ALBUMIN/GLOBULIN RATIO: 1.3 (ref 0.8–1.4)
ALBUMIN: 3.9 g/dL (ref 3.5–5.7)
ALKALINE PHOSPHATASE: 91 U/L (ref 34–104)
ALT (SGPT): 243 U/L — ABNORMAL HIGH (ref 7–52)
ANION GAP: 7 mmol/L (ref 4–13)
AST (SGOT): 135 U/L — ABNORMAL HIGH (ref 13–39)
BILIRUBIN TOTAL: 0.9 mg/dL (ref 0.3–1.2)
BUN/CREA RATIO: 9 (ref 6–22)
BUN: 6 mg/dL — ABNORMAL LOW (ref 7–25)
CALCIUM, CORRECTED: 9.4 mg/dL (ref 8.9–10.8)
CALCIUM: 9.3 mg/dL (ref 8.6–10.3)
CHLORIDE: 103 mmol/L (ref 98–107)
CO2 TOTAL: 31 mmol/L (ref 21–31)
CREATININE: 0.66 mg/dL (ref 0.60–1.30)
ESTIMATED GFR: 105 mL/min/{1.73_m2} (ref >59)
GLOBULIN: 3.1 (ref 2.9–5.4)
GLUCOSE: 115 mg/dL — ABNORMAL HIGH (ref 74–109)
OSMOLALITY, CALCULATED: 280 mOsm/kg (ref 270–290)
POTASSIUM: 3.5 mmol/L (ref 3.5–5.1)
PROTEIN TOTAL: 7 g/dL (ref 6.4–8.9)
SODIUM: 141 mmol/L (ref 136–145)

## 2022-05-16 LAB — CBC WITH DIFF
BASOPHIL #: 0 10*3/uL (ref 0.00–0.10)
BASOPHIL %: 1 % (ref 0–1)
EOSINOPHIL #: 0.1 10*3/uL (ref 0.00–0.50)
EOSINOPHIL %: 2 %
HCT: 52.5 % — ABNORMAL HIGH (ref 31.2–41.9)
HGB: 17.4 g/dL — ABNORMAL HIGH (ref 10.9–14.3)
LYMPHOCYTE #: 1.6 10*3/uL (ref 1.00–3.00)
LYMPHOCYTE %: 30 % (ref 16–44)
MCH: 29.2 pg (ref 24.7–32.8)
MCHC: 33.1 g/dL (ref 32.3–35.6)
MCV: 88.3 fL (ref 75.5–95.3)
MONOCYTE #: 0.6 10*3/uL (ref 0.30–1.00)
MONOCYTE %: 10 % (ref 5–13)
MPV: 8.5 fL (ref 7.9–10.8)
NEUTROPHIL #: 3.1 10*3/uL (ref 1.85–7.80)
NEUTROPHIL %: 57 % (ref 43–77)
PLATELETS: 143 10*3/uL (ref 140–440)
RBC: 5.95 10*6/uL — ABNORMAL HIGH (ref 3.63–4.92)
RDW: 17.3 % (ref 12.3–17.7)
WBC: 5.4 10*3/uL (ref 3.8–11.8)

## 2022-05-16 NOTE — Progress Notes (Unsigned)
Department of Hematology/Oncology  History and Physical    Name: Taylor Smith  VQM:[id number]  Date of Birth: 01-Apr-1971  Encounter Date: 05/16/2022    REFERRING PROVIDER:  No referring provider defined for this encounter.    REASON FOR OFFICE VISIT:  New patient for evaluation and management of stage IV clear cell renal carcinoma    HISTORY OF PRESENT ILLNESS:  Taylor Smith is a 52 y.o. 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  She was originally diagnosed after having a biopsy in the genitourinary area.  It came back consistent with clear cell renal cell carcinoma, which prompted an imaging study of the chest, abdomen, and pelvis.  She was found to have a massive left kidney mass with numerous small pulmonary nodules.  The renal mass appears to be involving the renal vein but not the inferior vena cava.    Prior to this, she was not having any symptoms.  Specifically, she did not have any hematuria or history of iron-deficiency anemia.  She does report back pain which has been going on for the past year but it is unclear whether that was related or not.    02/07/2022: The patient is here for follow up of stage IV kidney cancer.  She states that she tolerated the 1st treatment well.  She did have some achiness of the legs which was pretty brief in nature.    02/28/2022: The patient is here for follow up of stage IV kidney cancer.  She states that she has some pain in her legs for a few days following treatment.  She also reports that she is had a cough which has been present since her most recent surgery.  Today will be her 3rd cycle of treatment, and she has a CT scan scheduled for December 20.    03/21/2022:  The patient is here for follow up of metastatic renal cell carcinoma.  She was due for day 1 of cycle 4 of combination ipilimumab and nivolumab.  She was having some tolerability issues with the combination, but is receiving  additional premedications and states that it is helping to make the treatments easier.  She does not have any new issues at this time.    04/11/2022: The patient is here for follow up of metastatic renal cell cancer.  She has been on treatment with combination immunotherapy.  At last contact, her transaminases were mildly elevated.  On this visit, her bilirubin and transaminases are markedly higher.  She states that she did see a surgeon yesterday for consideration of nephrectomy.  She indicates that there was not a discussion regarding a jaundiced appearance at that time.  She had CT imaging performed approximately 2 days ago.    04/18/2022: The patient is here for follow up of metastatic renal cell carcinoma.  Immunotherapy has been on hold due to adverse liver function testing.  She has been on prednisone in her numbers have improved considerably.  She did go to the emergency room for an episode of abdominal pain recently and was prescribed Bentyl.    ROS:   Review of Systems   Constitutional:  Negative for appetite change, chills and fatigue.   HENT:   Negative for sore throat and trouble swallowing.    Eyes:  Negative for eye problems.   Respiratory:  Negative for cough and shortness of breath.    Cardiovascular:  Negative for chest pain and leg swelling.  Gastrointestinal:  Negative for abdominal pain.   Genitourinary:  Negative for dysuria and hematuria.    Musculoskeletal:  Negative for arthralgias and gait problem.   Skin:  Negative for rash.   Neurological:  Negative for gait problem.   Hematological:  Negative for adenopathy.   Psychiatric/Behavioral:  Negative for depression.         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)     Kidney cancer, primary, with metastasis from kidney to other site, left ([facility])          Past Surgical  History:   Procedure Laterality Date    HX CESAREAN SECTION      HX CHOLECYSTECTOMY      HX CYST REMOVAL Right     HX HYSTERECTOMY      TOTAL THYROIDECTOMY           Social History     Socioeconomic History    Marital status: Married     Spouse name: Not on file    Number of children: Not on file    Years of education: Not on file    Highest education level: Not on file   Occupational History    Not on file   Tobacco Use    Smoking status: Never    Smokeless tobacco: Never   Vaping Use    Vaping Use: Never used   Substance and Sexual Activity    Alcohol use: Never    Drug use: Never    Sexual activity: Yes   Other Topics Concern    Not on file   Social History Narrative    Not on file     Social Determinants of Health     Financial Resource Strain: Not on file   Transportation Needs: Not on file   Social Connections: Not on file   Intimate Partner Violence: High Risk (01/13/2022)    Intimate Partner Violence     SDOH Domestic Violence: No   Housing Stability: Not on file     Family Medical History:       Problem Relation (Age of Onset)    Cancer Maternal Aunt, Maternal Grandmother    Colon Cancer Maternal Uncle    Lung Cancer Mother    Melanoma Maternal Aunt            Current Outpatient Medications   Medication Sig    apixaban (ELIQUIS) 5 mg Oral Tablet Take 1 Tablet (5 mg total) by mouth Twice daily    axitinib (INLYTA) 5 mg Oral Tablet Take 1 Tablet (5 mg total) by mouth Twice daily    Benzonatate (TESSALON) 200 mg Oral Capsule Take 1 Capsule (200 mg total) by mouth Three times a day    cloNIDine HCL (CATAPRES) 0.1 mg Oral Tablet Take 1 Tablet (0.1 mg total) by mouth Once a day    dicyclomine (BENTYL) 20 mg Oral Tablet Take 1 Tablet (20 mg total) by mouth Four times a day as needed    ergocalciferol, vitamin D2, (DRISDOL) 1,250 mcg (50,000 unit) Oral Capsule Take 1 Capsule (50,000 Units total) by mouth Every 7 days    HYDROcodone-acetaminophen (NORCO) 5-325 mg Oral Tablet Take 2 Tablets by mouth Every 4 hours as  needed for Pain    Ibuprofen (MOTRIN) 600 mg Oral Tablet Take 1 Tablet (  600 mg total) by mouth Every 6 hours as needed    levothyroxine (SYNTHROID) 112 mcg Oral Tablet Take 1 Tablet (112 mcg total) by mouth Every morning    levothyroxine (SYNTHROID) 125 mcg Oral Tablet TAKE 1 TABLET (125 MCG TOTAL) BY MOUTH ONCE A DAY FOR 90 DAYS    lisinopriL (PRINIVIL) 40 mg Oral Tablet TAKE 1 TABLET BY MOUTH EVERY DAY (Patient taking differently: Take 1 Tablet (40 mg total) by mouth Once a day)    omeprazole (PRILOSEC) 40 mg Oral Capsule, Delayed Release(E.C.) Take 1 Capsule (40 mg total) by mouth Once a day    ondansetron (ZOFRAN ODT) 8 mg Oral Tablet, Rapid Dissolve Place 1 Tablet (8 mg total) under the tongue Every 8 hours as needed for Nausea/Vomiting    predniSONE (DELTASONE) 10 mg Oral Tablet PLEASE SEE ATTACHED FOR DETAILED DIRECTIONS     No Known Allergies    PHYSICAL EXAM:  Most Recent Vitals    Flowsheet Row Telemedicine from 01/13/2022 in Hematology/Oncology,   [facility]   Temperature 36.7 C (98.1 F) filed at... 01/13/2022 1431   Heart Rate 72 filed at... 01/13/2022 1431   Respiratory Rate --   BP (Non-Invasive) 146/73 filed at... 01/13/2022 1431   SpO2 97 % filed at... 01/13/2022 1431   Height 1.753 m ('5\' 9"'$ ) filed at... 01/13/2022 1431   Weight 94.2 kg (207 lb 9.6 oz) filed at... 01/13/2022 1431   BMI (Calculated) 30.72 filed at... 01/13/2022 1431   BSA (Calculated) 2.14 filed at... 01/13/2022 1431      ECOG Status: (0) Fully active, able to carry on all predisease performance without restriction   Physical Exam  Constitutional:       General: She is not in acute distress.     Appearance: Normal appearance.   Eyes:      Extraocular Movements: Extraocular movements intact.   Cardiovascular:      Rate and Rhythm: Normal rate and regular rhythm.   Pulmonary:      Effort: Pulmonary effort is normal.   Abdominal:      General: Abdomen is flat.      Palpations: Abdomen is soft.   Musculoskeletal:          General: Normal range of motion.      Cervical back: Normal range of motion.   Skin:     General: Skin is warm and dry.   Neurological:      General: No focal deficit present.      Mental Status: She is alert.   Psychiatric:         Mood and Affect: Mood normal.         DIAGNOSTIC DATA:  No results found for this or any previous visit (from the past 17520 hour(s)).    LABS:   CBC  Diff   Lab Results   Component Value Date/Time    WBC 6.5 04/18/2022 10:23 AM    HGB 17.0 (H) 04/18/2022 10:23 AM    HCT 52.2 (H) 04/18/2022 10:23 AM    PLTCNT 119 (L) 04/18/2022 10:23 AM    RBC 5.96 (H) 04/18/2022 10:23 AM    MCV 87.5 04/18/2022 10:23 AM    MCHC 32.6 04/18/2022 10:23 AM    MCH 28.6 04/18/2022 10:23 AM    RDW 17.0 04/18/2022 10:23 AM    MPV 9.6 04/18/2022 10:23 AM    Lab Results   Component Value Date/Time    PMNS 84 (H) 04/18/2022 10:23 AM  LYMPHOCYTES 7 (L) 04/18/2022 10:23 AM    EOSINOPHIL 2 04/18/2022 10:23 AM    MONOCYTES 7 04/18/2022 10:23 AM    BASOPHILS 0 04/18/2022 10:23 AM    BASOPHILS 0.00 04/18/2022 10:23 AM    PMNABS 5.50 04/18/2022 10:23 AM    LYMPHSABS 0.40 (L) 04/18/2022 10:23 AM    EOSABS 0.10 04/18/2022 10:23 AM    MONOSABS 0.40 04/18/2022 10:23 AM            ASSESSMENT:    ICD-10-CM    1. Kidney cancer, primary, with metastasis from kidney to other site, left (CMS HCC)  C64.2 CBC/DIFF     COMPREHENSIVE METABOLIC PANEL, NON-FASTING      2. Encounter for other general examination  Z00.8 CBC/DIFF           PLAN:   1. All relevant medical records were reviewed including available pertinent provider notes, procedure notes, imaging, laboratory, and pathology.   2. All pertinent labs and/or imaging were reviewed with the patient.   3. Stage IV renal carcinoma:  She had significant deterioration of liver function tests with combination immunotherapy.  We are switching to axitinib, but we are still waiting for the insurance to approve the switch.  She states that clinically she has been doing about the  same.  4. Autoimmune hepatitis:  Her numbers have improved and leveled off but steroids were recently restarted to try to get further improvement.  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 The patient is welcome to call with any questions or concerns in the meantime.     On the day of the encounter, a total of 35 minutes was spent on this patient encounter including review of historical information, examination, documentation and post-visit activities.   Return in about 4 weeks (around 06/13/2022).     Narda Rutherford, MD  05/16/2022, 09:28  The patient's insurance company bears full legal and financial responsibility resulting from any deviations that they cause to my recommended treatment plan.   CC:  Eleanora Neighbor, FNP-C  [address]  [place] [id number]    No referring provider defined for this encounter.    This note was partially generated using M[employer] Fluency Direct system, and there may be some incorrect words, spellings, and punctuation that were not noted in checking the note before saving.

## 2022-05-21 ENCOUNTER — Other Ambulatory Visit (INDEPENDENT_AMBULATORY_CARE_PROVIDER_SITE_OTHER): Payer: Self-pay | Admitting: HEMATOLOGY-ONCOLOGY

## 2022-05-21 MED ORDER — AXITINIB 5 MG TABLET
5.0000 mg | ORAL_TABLET | Freq: Two times a day (BID) | ORAL | 5 refills | Status: DC
Start: 2022-05-21 — End: 2022-11-27

## 2022-05-22 NOTE — Cancer Center Note (Incomplete)
Taylor Smith  [id number]  [birth year]  05/26/2022      Department of Hematology/Oncology  TELEMEDICINE CONSULT NOTE  VIDEO VISIT      TELEMEDICINE DOCUMENTATION:  Patient Location:  MyChart video visit from home address: [address]  [place] [address]  Patient/family aware of provider location:  yes  Patient/family consent for telemedicine:  yes  Examination observed and performed by:  Velda Wendt L Martinique, PA-C        REASON FOR VISIT:  Evaluation for hereditary cancer syndrome.         PATIENT IDENTIFICATION:    -52 y.o. female with Stage IV, metastatic renal (clear cell) cancer. Referred by Dr. Deatra Canter. Following with med/onc, at [facility], Dr. Jake Shark.   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   12/2021 underwent TAH/LSO: Clear Cell Carcinoma involving vaginal biopsy.   -Family History:   -Mother: Lung ca, age   -MGM: ***  -M Aunt: Melanoma, age ***   -M Uncle: Colon cancer, age ***      HPI:  Taylor Smith is a 52 y.o. female  who presents to the [facility] genetics clinic today for evaluation of their ***personal and family history of cancer and possible genetic testing.        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   Maternal ethnicity: ***  Paternal ethnicity: ***  No known environmental events/factors come to mind  for the patient in terms of the patient's geographical location or any occupational exposures.    *A family pedigree was constructed and filed in the genetics chart.      Menstrual History    Total time span for birth control pills      Use of HRT (how long total use)      Use of Infertility Treatments      Age of Menarche      Avg. frequency (days)      Duration (days)      Flow      Pattern      Age of Menopause         OB History   No obstetric history on file.       GYN:   Menarche:  ***  Menopause:  ***  G:  ***  P:  ***  Age First Live Birth:  ***  Age 1st Mammogram:  ***  H/O  Breast Biopsy:  ***  H/O HRT: ***  H/O OCPs: ***  H/O Infertility: ***  Social History     Socioeconomic History    Marital status: Married   Tobacco Use    Smoking status: Never    Smokeless tobacco: Never   Vaping Use    Vaping Use: Never used   Substance and Sexual Activity    Alcohol use: Never    Drug use: Never    Sexual activity: Yes     Social Determinants of Health     Intimate Partner Violence: High Risk (01/13/2022)    Intimate Partner Violence     SDOH Domestic Violence: No     Expanded Substance History               Current Outpatient Medications:     apixaban (ELIQUIS) 5 mg Oral Tablet, Take 1 Tablet (5 mg total) by mouth Twice daily, Disp: 60 Tablet, Rfl: 5    axitinib (INLYTA) 5 mg Oral Tablet, Take 1 Tablet (5 mg total) by mouth Twice daily, Disp: 60 Tablet, Rfl: 5    Benzonatate (TESSALON) 200 mg Oral Capsule, Take 1 Capsule (200 mg total) by mouth Three times a day, Disp: 60 Capsule, Rfl: 0    cloNIDine HCL (CATAPRES) 0.1 mg Oral Tablet, Take 1 Tablet (0.1 mg total) by mouth Once a day, Disp: , Rfl:     dicyclomine (BENTYL) 20 mg Oral Tablet, Take 1 Tablet (20 mg total) by mouth Four times a day as needed, Disp: 30 Tablet, Rfl: 0    ergocalciferol, vitamin D2, (DRISDOL) 1,250 mcg (50,000 unit) Oral Capsule, Take 1 Capsule (50,000 Units total) by mouth Every 7 days, Disp: , Rfl:     HYDROcodone-acetaminophen (NORCO)  5-325 mg Oral Tablet, Take 2 Tablets by mouth Every 4 hours as needed for Pain, Disp: , Rfl:     Ibuprofen (MOTRIN) 600 mg Oral Tablet, Take 1 Tablet (600 mg total) by mouth Every 6 hours as needed, Disp: 60 Tablet, Rfl: 1    levothyroxine (SYNTHROID) 112 mcg Oral Tablet, Take 1 Tablet (112 mcg total) by mouth Every morning, Disp: 90 Tablet, Rfl: 4    levothyroxine (SYNTHROID) 125 mcg Oral Tablet, TAKE 1 TABLET (125 MCG TOTAL) BY MOUTH ONCE A DAY FOR 90 DAYS, Disp: 90 Tablet, Rfl: 0    lisinopriL (PRINIVIL) 40 mg Oral Tablet, TAKE 1 TABLET BY MOUTH EVERY DAY (Patient taking differently: Take 1 Tablet (40 mg total) by mouth Once a day), Disp: 90 Tablet, Rfl: 1    omeprazole (PRILOSEC) 40 mg Oral Capsule, Delayed Release(E.C.), Take 1 Capsule (40 mg total) by mouth Once a day, Disp: 90 Capsule, Rfl: 4    ondansetron (ZOFRAN ODT) 8 mg Oral Tablet, Rapid Dissolve, Place 1 Tablet (8 mg total) under the tongue Every 8 hours as needed for Nausea/Vomiting, Disp: 30 Tablet, Rfl: 2    predniSONE (DELTASONE) 10 mg Oral Tablet, PLEASE SEE ATTACHED FOR DETAILED DIRECTIONS, Disp: , Rfl:     Current Facility-Administered Medications:     methylPREDNISolone sod succ (SOLU-medrol) 125 mg/2 mL injection, 125 mg, IntraMUSCULAR, Once, Narda Rutherford, MD      No Known Allergies    REVIEW OF SYSTEMS: Denies any headache, dizziness, nasal congestion, sore throat, chest pain, dyspnea, nausea, vomiting, abdomnial pain, diarrhea, constipation, seizures, and ataxia. All other systems were reviewed and are negative.      Objective:  No vital signs.   General: very pleasant female who appears stated age  and is in no acute distress.    Psychiatric: Normal affect and mood.      EDUCATION AND COUNSELING:  A careful construction of the patient's pedigree revealed atypical ages of cancer onset, multiple primary cancers in relatives, a spectrum of associated tumors and a clear pattern of transmission.  Per NCCN guidelines, it is recommended that the  patient undergo genetic testing.      Comprehensive Common Hereditary Cancer Panel testing was identified to provide the most definitive genetic diagnostic information.  Discussed that genes sequenced via this comprehensive panel have been found to be associated with hereditary breast and ovarian cancer and/or family pedigrees have been found to overlap with genes analyzed in this panel.  Guidelines from the [employer]  (NCCN) are available to manage those with various genetic mutations.     Most genes analyzed are inherited autosomal dominantly, meaning if patient is found to carry a mutation, there is a 50% risk of other close relatives carrying the same mutation. Genetic testing is a family affair. Should the results show a genetic syndrome/actionable mutation, patient has a "duty to warn" other relatives and share results. Some mutations are inherited autosomal recessively, meaning while close relatives may be a carrier, their is a 25% risk of having the syndrome and elevated cancer/disease risks.     BRCA1/BRCA2              The risks of cancer especially with BRCA1 and BRCA2 mutations were discussed in detail. If a mutation were found, the patient would have a significantly increased risk for breast and ovarian cancer compared to the general population. Individuals with this mutation face up to a 50% risk of developing breast cancer by age 109 and up to an 70% risk for developing breast cancer by age 36. They also face up to 44% risk for developing ovarian cancer by the age of 16. The patient's who already have a personal history of breast cancer face up to 27% risk of developing contralateral breast cancer in 5 years and up to a 44% risk after 10 years. Other cancers such as lymphoma, leukemia, pancreatic cancer, colon cancer, gastric cancer, gallbladder cancer, bile duct cancer, and prostate cancer are also described in this subset of patients who carry the BRCA1 or BRCA 2 mutation.  Too, it was explained to the patient that these 2 mutations are inherited autosomal dominantly meaning there is a 50% chance that if she is found to carry the mutation they will pass it onto the next generation and/or other close relatives have a 50% risk of having inherited the same gene.                The availability of clinical management options for mutation carriers were discussed in detail including increased surveillance with mammogram and/or breast MRI, chemoprevention with tamoxifen and prophylactic surgery with bilateral modified radical mastectomy and/or bilateral salpingo-oophorectomy. The details of the testing process including benefits and limitations as well as implication of possible test results were discussed.  Other screenings/interventions may be required depending on the prevalence of other associated cancers in the family and gender of the affected individuals.       Cowden's Syndrome (CS)/PHTS  Cowden syndrome is a hereditary disorder characterized by multiple noncancerous, tumor-like growths called hamartomas and an increased risk of developing certain cancers. Almost everyone with Cowden syndrome develops hamartomas. Cowden syndrome is associated with an increased risk of developing several types of cancer, particularly cancers of the breast, thyroid,  and endometrium. Other cancers that have been identified in people with Cowden syndrome include colorectal cancer, kidney cancer, and melanoma. Compared with the general population, people with Cowden syndrome develop these cancers at younger ages, often beginning in their thirties or forties. Other diseases of the breast (fibrocystic breast disease), thyroid, and endometrium (fibroids) are also common in Cowden syndrome. Additional signs and symptoms can include macrocephaly and rare benign brain tumors of the cerebellum called Lhermitte-Duclos disease. A small percentage of affected individuals have delayed development or intellectual  disability.      LiFraumeni Syndrome (LFS):  LiFraumeni Syndrome is an extremely rare autosomal dominant hereditary disorder.  This syndrome increases susceptibility to cancer. The syndrome is linked to germline mutations of the p53 tumor suppressor gene.  Person's with LFS have an approximately 25-fold risk of developing malignant tumor by age 67 than the population average, and are at risk for a wide range of malignancies with particularly high occurrances of breast, brain tumors, acute leukemia, soft tissue sarcomas, osteosarcomas, and adrenal cortical carcinoma.        I discussed that those who carry a p53 mutation and therefore have LFS benefit from some of the following:  1) Increased breast screening with monthly self-breast exams starting at age 65, clinical breast exams q 6-12 months and annual mammograms and breast MRIs starting b/t ages 68-25 y/o.    2) Annual comprehensive physical examination with high index of suspicion for rare cancers and second malignancies in cancer survivors, including careful skin and neurological exams.  3) Therapeutic radiation therapy for cancer should be used with caution.  4) Colonoscopy q 2-5 years starting age 68  5) Advise pediatric patient's to have increased screenings with pediatrician for childhood cancers.  6) Consider clinical trial enrollment for screenings vs whole body MRI, abdominal US, and MRI brain.  7) Target surveillance based on family history                 CHEK2              The CHEK2 protein acts as a tumor suppressor. Women with mutations in the CHEK2 gene have an increased risk for breast cancer, sometimes at relatively young ages. Men with mutations in the CHEK2 gene also have an increased risk for breast cancer. This risk is much smaller than the risk for women.  Women with CHEK2 mutations may benefit from a variety of options to reduce their risk for breast cancer. This could include starting breast screenings at younger ages than normal and  having the screenings more often than typically recommended. The same may be true for prostate cancer and colorectal cancer screenings, especially if there is history of these cancers in the family. We have only recently discovered that CHEK2 mutations increase cancer risks and as we learn more, there may be new information about risks for other cancers and the best ways to reduce those risks. For this reason, people with CHEK2 mutations may benefit from speaking with healthcare providers who specialize in the genetics of inherited cancer risks.    PALB2 "Partner and Localizer BRCA2 gene"  PALB2 is an essential partner of BRCA2 that promotes the localization and stability of BRCA2. Women with mutations in the PALB2 gene have an increased risk for breast cancer, sometimes at relatively young ages.  Men with mutations in the PALB2 gene also have an increased risk for breast cancer. This risk is much smaller than the risk for women. Men and women with PALB2 mutations  have an increased risk for pancreatic cancer.               Women with PALB2 mutations may benefit from a variety of options to reduce their risk for breast cancer. This could include starting screenings at younger ages than normal and having the screenings more often than typically recommended. If there is a family history of pancreatic cancer, it may be reasonable to also consider screening for pancreatic cancer, but this is considered experimental at this time.    [facility] SYNDROME:   [facility] Syndrome/Hereditary Non Polyposis Colorectal Cancer Syndrome (MLH1, MSH2, MSH6, PMS2, and EPCAM)  The risk for colorectal cancer associated with HNPCC mutations were discussed.    It was explained to the patient cancer risks associated with this syndrome. In general, the lifetime risk of colorectal cancer is up to 82% in mutation carriers compared to the general population of 2%. The risk of endometrial cancer is up to 71% in mutation carriers compared to the general  population of 1.5%. The risk of stomach cancer is up to 13% in mutation carriers compared to the general population of less than 1%, and the risk of ovarian cancer is up to 12% in mutation carriers compared to the general populations risk of 1%. [facility] can also be associated with other cancers. NCCN has clear guidelines for the management of those with [facility] syndrome, which may include the following:     Colonoscopy beginning at age 6-25 (or 5-10 years earlier than the youngest colorectal cancer in the family which ever comes first) which is to be performed every 1-2 years and then annually starting at age 58. Also, screening for endometrial cancer and ovarian cancer, which include annual gynecologic examinations, annual transvaginal ultrasound, annual endometrial aspiration and annual CA125 level starting between ages 75-35.  It was also explained that risk reducing removal of the uterus and/or ovaries decrease the risks of endometrial and ovarian cancer and is an option after childbearing is completed.    CDH1/Hereditary Diffuse Gastric Cancer Syndrome (HDGC)              The risk for cancers associated with CDH1 mutations were discussed in detail. If a mutation were found, the patient would have a significantly increased risk of developing CDH1 related cancers.  It has autosomal dominant susceptibility for diffuse gastric cancer, a poorly differentiated adenocarcinoma that infiltrates into the stomach wall causing thickening of the wall without forming a distinct mass. Diffuse gastric cancer is also referred to as signet ring carcinoma or isolated cell-type carcinoma.  Women also have a 39%-52% risk for lobular breast cancer. Additionally, the risks of colon cancer appear to be increased, but not well defined at this point.     Treatment of manifestations: Ideally, management of individuals who have a CDH1 cancer-predisposing mutation is either intense surveillance for early detection and treatment of gastric  cancer or prophylactic gastrectomy. Women will benefit from increased breast surveillance with monthly self-breast exams, clinical breast exams q 6-12 months, and breast annual MRI starting at age 29 and annual breast MRI and mammogram starting at age 63.     Surveillance: To date, the optimal management of individuals at risk for a cancer- predisposing mutation has been controversial because of the unproven value of surveillance regimens and the potential morbidity and mortality from prophylactic gastrectomy. The option for avilable investigational trials should be discussed.         ATM gene:   The ATM gene is associated with an increased  risk for autosomal dominant breast, pancreatic and prostate cancers however a definitive risk percentages is unknown. The ATM gene is also associated with autosomal recessive ataxia-telangiectasia (A-T). There is preliminary evidence supporting a correlation with autosomal dominant colorectal cancer and possibly other cancers.       The lifetime risk of breast cancer in females with one pathogenic ATM variant ranges between 17-60%. The risk of developing pancreatic and prostate cancer is also elevated. The risk of other cancer such as colorectal may be increased as well. Currently NCCN has guidelines for screening of breast cancer for those with ATM, however, no current guidelines exist for screening of pancreatic cancer as a standard of care. Clinical trials are an option.        Per NCCN guidelines: multi-gene testing can include "intermediate" penetrant ( moderate - risk) genes. For many of these genes, there are limited data on the degree of cancer risk and there are no clear guidelines on risk management for carriers of mutations. Not all genes included on available multi-gene tests are necessarily clinically actionable. As is the case with high -risk genes, it is possible that the risks associated with moderate-risk genes may not be entirely due to the gene alone, but may  be influence by gene/gene or gene/envirmomental interreactions. Therefore, it may be difficulty to use a known mutation alone to assign risk for relatives. In many cases the information from testing moderate penetrance genes does not change risk management compared to that based on family history alone. Depending on the insurance provider, additional testing may not be covered if initial testing is negative. Therefore, the benefits of panel testing initially, rules out the potential for future out of pocket expense.     There is an increased likelihood of finding variants of uncertain significance when testing for mutations in multiple genes.  In the absence of a definitive mutation, the patient's risk of future cancers and medical management recommendations must be based on personal and fhx of cancer.    The potential outcomes regarding results of panel testing were discussed as well as the potential for recommendations and results to change/evolve in the future. It is for this reason, pre-test counseling was completed during this encounter and post -test counseling will be provided during results disclosure.     Genes included in this comprehensive panel include the following:  ***        ASSESSMENT:   Kidney cancer, primary, with metastasis from kidney to other site, left (CMS [facility])    Encounter for nonprocreative genetic counseling    Family history of cancer   Taylor Smith is a 52 y.o. female with a personal history of *** cancer and strong family history of malignancy suggestive of a hereditary syndrome.       PLAN:   1. All relative medical records, including H&P's, progress notes, imaging's and pathology were reviewed.  2. Discussed pros and cons of genetic testing at length. Afterwards, informed consent was obtained from the patient verbally to pursue genetic testing.    Comprehensive Common Hereditary Cancer Panel Testing via saliva sample home kit was ordered today through [facility] genetic laboratories.  Patient pedigree, and TRF was sent to the [place].   We did discuss that the financial team at the [facility] will review the patient's insurance coverage. We did discuss potential for OOP expenses and the genetic company will contact patient if OOP is estimated to be >$100 to get consent from the patient first.  For those  whose insurance denies/later denies testing, [employer] offers straight cost of $250.00. Also, [employer] has a financial assistance program that patients may be eligible for. Testing not returned within 6 weeks of this visit will be cancelled and should they decide to do testing in the future they would need to set up a new appointment.   3. The patient will be notified by telephone when results are in disclosure and is aware that this can take anywhere between 3-6 weeks on average. Results can come back as positive, negative, or variant of uncertain significance (VUS). I also discussed risks of ambiguous and unexpected results.  If testing is uninformative, we will make recommendations for the patient and their families future screening and interventions based on the family history and/or relative calculated risk models. Note VUS result(s) do not have an impact at this time on Clinical Management or Individual/Family Risk.   4. Educational materials were provided to the patient.       Taylor Smith was given the chance to ask questions, and these were answered to their satisfaction. The patient is welcome to call with any questions or concerns in the meantime.       On the day of the encounter, I spent a total of  *** minutes on this patient encounter including review of historical information, examination, documentation and post-visit activities.       Dymin Dingledine L [place], [place], PA-C   [place] Department of Medicine   Section of Hematology/Oncology    [address], [place]  [place],  [place] [address]

## 2022-05-23 ENCOUNTER — Other Ambulatory Visit: Payer: Self-pay

## 2022-05-23 ENCOUNTER — Other Ambulatory Visit: Payer: 59 | Attending: HEMATOLOGY-ONCOLOGY

## 2022-05-23 DIAGNOSIS — C642 Malignant neoplasm of left kidney, except renal pelvis: Secondary | ICD-10-CM | POA: Insufficient documentation

## 2022-05-23 DIAGNOSIS — Z008 Encounter for other general examination: Secondary | ICD-10-CM | POA: Insufficient documentation

## 2022-05-23 LAB — CBC WITH DIFF
BASOPHIL #: 0 10*3/uL (ref 0.00–0.10)
BASOPHIL %: 1 % (ref 0–1)
EOSINOPHIL #: 0.1 10*3/uL (ref 0.00–0.50)
EOSINOPHIL %: 2 %
HCT: 52.4 % — ABNORMAL HIGH (ref 31.2–41.9)
HGB: 17.3 g/dL — ABNORMAL HIGH (ref 10.9–14.3)
LYMPHOCYTE #: 1.4 10*3/uL (ref 1.00–3.00)
LYMPHOCYTE %: 20 % (ref 16–44)
MCH: 29.3 pg (ref 24.7–32.8)
MCHC: 33 g/dL (ref 32.3–35.6)
MCV: 88.7 fL (ref 75.5–95.3)
MONOCYTE #: 0.5 10*3/uL (ref 0.30–1.00)
MONOCYTE %: 8 % (ref 5–13)
MPV: 8.3 fL (ref 7.9–10.8)
NEUTROPHIL #: 4.9 10*3/uL (ref 1.85–7.80)
NEUTROPHIL %: 71 % (ref 43–77)
PLATELETS: 138 10*3/uL — ABNORMAL LOW (ref 140–440)
RBC: 5.91 10*6/uL — ABNORMAL HIGH (ref 3.63–4.92)
RDW: 17.9 % — ABNORMAL HIGH (ref 12.3–17.7)
WBC: 6.9 10*3/uL (ref 3.8–11.8)

## 2022-05-23 LAB — COMPREHENSIVE METABOLIC PANEL, NON-FASTING
ALBUMIN/GLOBULIN RATIO: 1.3 (ref 0.8–1.4)
ALBUMIN: 4 g/dL (ref 3.5–5.7)
ALKALINE PHOSPHATASE: 84 U/L (ref 34–104)
ALT (SGPT): 342 U/L — ABNORMAL HIGH (ref 7–52)
ANION GAP: 7 mmol/L (ref 4–13)
AST (SGOT): 232 U/L — ABNORMAL HIGH (ref 13–39)
BILIRUBIN TOTAL: 1.1 mg/dL (ref 0.3–1.2)
BUN/CREA RATIO: 15 (ref 6–22)
BUN: 11 mg/dL (ref 7–25)
CALCIUM, CORRECTED: 9.8 mg/dL (ref 8.9–10.8)
CALCIUM: 9.8 mg/dL (ref 8.6–10.3)
CHLORIDE: 103 mmol/L (ref 98–107)
CO2 TOTAL: 30 mmol/L (ref 21–31)
CREATININE: 0.74 mg/dL (ref 0.60–1.30)
ESTIMATED GFR: 97 mL/min/{1.73_m2} (ref >59)
GLOBULIN: 3.1 (ref 2.9–5.4)
GLUCOSE: 162 mg/dL — ABNORMAL HIGH (ref 74–109)
OSMOLALITY, CALCULATED: 282 mOsm/kg (ref 270–290)
POTASSIUM: 4.2 mmol/L (ref 3.5–5.1)
PROTEIN TOTAL: 7.1 g/dL (ref 6.4–8.9)
SODIUM: 140 mmol/L (ref 136–145)

## 2022-05-26 ENCOUNTER — Encounter (HOSPITAL_BASED_OUTPATIENT_CLINIC_OR_DEPARTMENT_OTHER): Payer: Self-pay | Admitting: Medical

## 2022-05-26 ENCOUNTER — Ambulatory Visit: Payer: 59 | Attending: Family | Admitting: Medical

## 2022-05-26 DIAGNOSIS — Z809 Family history of malignant neoplasm, unspecified: Secondary | ICD-10-CM

## 2022-05-26 DIAGNOSIS — C642 Malignant neoplasm of left kidney, except renal pelvis: Secondary | ICD-10-CM | POA: Insufficient documentation

## 2022-05-26 DIAGNOSIS — Z7183 Encounter for nonprocreative genetic counseling: Secondary | ICD-10-CM

## 2022-05-26 NOTE — Addendum Note (Signed)
Addended by: [place], Leisa Gault L on: 05/26/2022 02:03 PM     Modules accepted: Orders

## 2022-05-27 ENCOUNTER — Other Ambulatory Visit (HOSPITAL_COMMUNITY): Payer: Self-pay | Admitting: NURSE PRACTITIONER

## 2022-05-27 ENCOUNTER — Telehealth (HOSPITAL_COMMUNITY): Payer: Self-pay | Admitting: HEMATOLOGY-ONCOLOGY

## 2022-05-27 DIAGNOSIS — R748 Abnormal levels of other serum enzymes: Secondary | ICD-10-CM

## 2022-05-27 NOTE — Telephone Encounter (Addendum)
Received [facility] Live Triage call from patient.    Patient requesting advice regarding elevated LFTs. She recently consulted with Dr. Tasia Catchings on 1/16 and it was decided pt will continue oncology care in [facility] with Dr. Jake Shark. Dr. Tasia Catchings prescribed prednisone '20mg'$  x 7 days, '10mg'$  x 7 days for pt's elevated LFTs. She completed the prednisone course on 1/31 or 2/1. Labs resulted Friday and notes LFTs were elevated. Pt contacted Dr. Nadara Mustard office but was told he is away on vacation this week. The office staff recommended reaching out to Dr. Tasia Catchings and did not offer to reach out to a covering provider locally.      2/2  1/26 1/16   ALT [phone]   AST [phone]   Tbili 1.1 0.9 1.1     Patient asks if Dr. Tasia Catchings would be able to advise her on prednisone while Dr. Jake Shark is out of the office. She is concerned as the LFTs are rising, and she is unable to start treatment until LFTs are down.    Addendum: Pt denies any n/v, abdominal pain, jaundice to eyes or skin, fever, or chills. Denies new symptoms.     Will notify provider.

## 2022-05-27 NOTE — Telephone Encounter (Signed)
Received [facility] Live Triage call from patient.    Patient updates Dr. Nadara Mustard office will order the [facility] for tomorrow, 2/7. States labs are completed through the [facility] so results are same-day and can be viewed in [place].    She will start prednisone today and complete lab tomorrow.

## 2022-05-27 NOTE — Telephone Encounter (Signed)
Informed patient of message per Griffith Citron, NP. Patient Taylor Smith, she will pick up prednisone and start this today. Pt understands she will need to request advice from Dr. Nadara Mustard office next week on what to do re: prednisone.     Pt has been completing labs at Dr. Nadara Mustard office. She will inform them of plan as given per Griffith Citron, NP and ask if they can schedule CMP for tomorrow. If order is needed, she will call back to triage.    Will notify provider.

## 2022-05-27 NOTE — Telephone Encounter (Signed)
Let's have her restart prednisone 10 mg daily until she is able to get in touch with Dr. Jake Shark next week. I will send this in for her (sending in a 30 day supply but she should contact Dr. Nadara Mustard office next week for guidance on what to do). In the meantime, she should get LFTs rechecked tomorrow (2/7) so that we can get the results before the end of the week and increase the prednisone dose at that time if needed.    Does she need a lab order for CMP?

## 2022-05-27 NOTE — Telephone Encounter (Signed)
Patient called and stated that her liver enzymes were elevated and that she had called Dr. Tasia Catchings and they put her on prednisone 10 mg for one week and repeat CMP labs in the morning.

## 2022-05-28 ENCOUNTER — Other Ambulatory Visit: Payer: Self-pay

## 2022-05-28 ENCOUNTER — Encounter (HOSPITAL_COMMUNITY): Payer: Self-pay | Admitting: HEMATOLOGY-ONCOLOGY

## 2022-05-28 ENCOUNTER — Other Ambulatory Visit: Payer: 59 | Attending: HEMATOLOGY-ONCOLOGY

## 2022-05-28 DIAGNOSIS — E039 Hypothyroidism, unspecified: Secondary | ICD-10-CM | POA: Insufficient documentation

## 2022-05-28 DIAGNOSIS — R748 Abnormal levels of other serum enzymes: Secondary | ICD-10-CM | POA: Insufficient documentation

## 2022-05-28 LAB — COMPREHENSIVE METABOLIC PANEL, NON-FASTING
ALBUMIN/GLOBULIN RATIO: 1.1 (ref 0.8–1.4)
ALBUMIN: 4 g/dL (ref 3.5–5.7)
ALKALINE PHOSPHATASE: 86 U/L (ref 34–104)
ALT (SGPT): 388 U/L — ABNORMAL HIGH (ref 7–52)
ANION GAP: 5 mmol/L (ref 4–13)
AST (SGOT): 271 U/L — ABNORMAL HIGH (ref 13–39)
BILIRUBIN TOTAL: 1.5 mg/dL — ABNORMAL HIGH (ref 0.3–1.2)
BUN/CREA RATIO: 15 (ref 6–22)
BUN: 10 mg/dL (ref 7–25)
CALCIUM, CORRECTED: 9.9 mg/dL (ref 8.9–10.8)
CALCIUM: 9.9 mg/dL (ref 8.6–10.3)
CHLORIDE: 102 mmol/L (ref 98–107)
CO2 TOTAL: 33 mmol/L — ABNORMAL HIGH (ref 21–31)
CREATININE: 0.67 mg/dL (ref 0.60–1.30)
ESTIMATED GFR: 105 mL/min/{1.73_m2} (ref >59)
GLOBULIN: 3.5 (ref 2.9–5.4)
GLUCOSE: 118 mg/dL — ABNORMAL HIGH (ref 74–109)
OSMOLALITY, CALCULATED: 280 mOsm/kg (ref 270–290)
POTASSIUM: 4.6 mmol/L (ref 3.5–5.1)
PROTEIN TOTAL: 7.5 g/dL (ref 6.4–8.9)
SODIUM: 140 mmol/L (ref 136–145)

## 2022-05-28 LAB — THYROID STIMULATING HORMONE (SENSITIVE TSH): TSH: 5.079 u[IU]/mL (ref 0.450–5.330)

## 2022-05-28 NOTE — Progress Notes (Signed)
Patient returned call regarding Taylor Smith, she states she received the medication on Monday 05/26/22 but has not started the medication yet due to elevated liver enzymes. She has spoken with her provider at [facility] and states that they have started her on Prednisone 10 mg daily. She will follow up with our office on Monday when Dr Jake Shark returns.

## 2022-06-03 ENCOUNTER — Encounter (HOSPITAL_COMMUNITY): Payer: Self-pay | Admitting: HEMATOLOGY-ONCOLOGY

## 2022-06-03 NOTE — Progress Notes (Signed)
Patient called regarding starting Inlyta with elevated liver enzymes and continuing the Prednisone that [facility] had prescribed. Per Dr Jake Shark okay to start Inlyta and continue Prednisone as prescribed, patient notified, verbalized understanding. Encouraged patient to call with any further questions or concerns.

## 2022-06-06 ENCOUNTER — Other Ambulatory Visit (HOSPITAL_COMMUNITY): Payer: Self-pay | Admitting: HEMATOLOGY-ONCOLOGY

## 2022-06-11 ENCOUNTER — Encounter (HOSPITAL_BASED_OUTPATIENT_CLINIC_OR_DEPARTMENT_OTHER): Payer: Self-pay | Admitting: Medical

## 2022-06-12 ENCOUNTER — Other Ambulatory Visit (HOSPITAL_COMMUNITY): Payer: Self-pay | Admitting: HEMATOLOGY-ONCOLOGY

## 2022-06-12 MED ORDER — PREDNISONE 10 MG TABLET
ORAL_TABLET | ORAL | 0 refills | Status: DC
Start: 2022-06-12 — End: 2022-06-13

## 2022-06-12 NOTE — Telephone Encounter (Signed)
Already done

## 2022-06-13 ENCOUNTER — Ambulatory Visit
Admission: RE | Admit: 2022-06-13 | Discharge: 2022-06-13 | Disposition: A | Discharge status: Home / Self Care | Payer: 59 | Source: Ambulatory Visit | Attending: HEMATOLOGY-ONCOLOGY | Admitting: HEMATOLOGY-ONCOLOGY

## 2022-06-13 ENCOUNTER — Other Ambulatory Visit: Payer: Self-pay

## 2022-06-13 ENCOUNTER — Ambulatory Visit (HOSPITAL_COMMUNITY): Payer: 59 | Admitting: HEMATOLOGY-ONCOLOGY

## 2022-06-13 ENCOUNTER — Encounter (HOSPITAL_COMMUNITY): Payer: Self-pay | Admitting: HEMATOLOGY-ONCOLOGY

## 2022-06-13 VITALS — BP 163/97 | HR 67 | Temp 97.5°F | Ht 69.0 in | Wt 222.7 lb

## 2022-06-13 DIAGNOSIS — I823 Embolism and thrombosis of renal vein: Secondary | ICD-10-CM | POA: Insufficient documentation

## 2022-06-13 DIAGNOSIS — Z7901 Long term (current) use of anticoagulants: Secondary | ICD-10-CM | POA: Insufficient documentation

## 2022-06-13 DIAGNOSIS — Z808 Family history of malignant neoplasm of other organs or systems: Secondary | ICD-10-CM | POA: Insufficient documentation

## 2022-06-13 DIAGNOSIS — Z800 Family history of malignant neoplasm of digestive organs: Secondary | ICD-10-CM | POA: Insufficient documentation

## 2022-06-13 DIAGNOSIS — K754 Autoimmune hepatitis: Secondary | ICD-10-CM | POA: Insufficient documentation

## 2022-06-13 DIAGNOSIS — R748 Abnormal levels of other serum enzymes: Secondary | ICD-10-CM

## 2022-06-13 DIAGNOSIS — C642 Malignant neoplasm of left kidney, except renal pelvis: Secondary | ICD-10-CM | POA: Insufficient documentation

## 2022-06-13 DIAGNOSIS — M549 Dorsalgia, unspecified: Secondary | ICD-10-CM | POA: Insufficient documentation

## 2022-06-13 DIAGNOSIS — Z801 Family history of malignant neoplasm of trachea, bronchus and lung: Secondary | ICD-10-CM | POA: Insufficient documentation

## 2022-06-13 DIAGNOSIS — Z809 Family history of malignant neoplasm, unspecified: Secondary | ICD-10-CM | POA: Insufficient documentation

## 2022-06-13 LAB — CBC WITH DIFF
BASOPHIL #: 0.1 10*3/uL (ref 0.00–0.10)
BASOPHIL %: 1 % (ref 0–1)
EOSINOPHIL #: 0.1 10*3/uL (ref 0.00–0.50)
EOSINOPHIL %: 1 %
HCT: 54.1 % — ABNORMAL HIGH (ref 31.2–41.9)
HGB: 18.1 g/dL — ABNORMAL HIGH (ref 10.9–14.3)
LYMPHOCYTE #: 3.1 10*3/uL — ABNORMAL HIGH (ref 1.00–3.00)
LYMPHOCYTE %: 27 % (ref 16–44)
MCH: 29.8 pg (ref 24.7–32.8)
MCHC: 33.4 g/dL (ref 32.3–35.6)
MCV: 89.1 fL (ref 75.5–95.3)
MONOCYTE #: 0.9 10*3/uL (ref 0.30–1.00)
MONOCYTE %: 8 % (ref 5–13)
MPV: 8.9 fL (ref 7.9–10.8)
NEUTROPHIL #: 7.1 10*3/uL (ref 1.85–7.80)
NEUTROPHIL %: 63 % (ref 43–77)
PLATELETS: 173 10*3/uL (ref 140–440)
RBC: 6.06 10*6/uL — ABNORMAL HIGH (ref 3.63–4.92)
RDW: 17.7 % (ref 12.3–17.7)
WBC: 11.3 10*3/uL (ref 3.8–11.8)

## 2022-06-13 LAB — COMPREHENSIVE METABOLIC PANEL, NON-FASTING
ALBUMIN/GLOBULIN RATIO: 1.2 (ref 0.8–1.4)
ALBUMIN: 4 g/dL (ref 3.5–5.7)
ALKALINE PHOSPHATASE: 67 U/L (ref 34–104)
ALT (SGPT): 117 U/L — ABNORMAL HIGH (ref 7–52)
ANION GAP: 6 mmol/L (ref 4–13)
AST (SGOT): 58 U/L — ABNORMAL HIGH (ref 13–39)
BILIRUBIN TOTAL: 0.8 mg/dL (ref 0.3–1.2)
BUN/CREA RATIO: 19 (ref 6–22)
BUN: 12 mg/dL (ref 7–25)
CALCIUM, CORRECTED: 9.4 mg/dL (ref 8.9–10.8)
CALCIUM: 9.4 mg/dL (ref 8.6–10.3)
CHLORIDE: 103 mmol/L (ref 98–107)
CO2 TOTAL: 31 mmol/L (ref 21–31)
CREATININE: 0.64 mg/dL (ref 0.60–1.30)
ESTIMATED GFR: 106 mL/min/{1.73_m2} (ref >59)
GLOBULIN: 3.3 (ref 2.9–5.4)
GLUCOSE: 70 mg/dL — ABNORMAL LOW (ref 74–109)
OSMOLALITY, CALCULATED: 278 mOsm/kg (ref 270–290)
POTASSIUM: 3.8 mmol/L (ref 3.5–5.1)
PROTEIN TOTAL: 7.3 g/dL (ref 6.4–8.9)
SODIUM: 140 mmol/L (ref 136–145)

## 2022-06-13 LAB — EMPOWER COMPREHENSIVE (2+79): REPORT SUMMARY: NEGATIVE

## 2022-06-13 MED ORDER — PREDNISONE 10 MG TABLET
ORAL_TABLET | ORAL | 1 refills | Status: DC
Start: 2022-06-13 — End: 2022-08-29

## 2022-06-13 NOTE — Progress Notes (Unsigned)
Department of Hematology/Oncology  History and Physical    Name: Taylor Smith  [id number]  Date of Birth: 03/17/1971  Encounter Date: 06/13/2022    REFERRING PROVIDER:  No referring provider defined for this encounter.    REASON FOR OFFICE VISIT:  New patient for evaluation and management of stage IV clear cell renal carcinoma    HISTORY OF PRESENT ILLNESS:A  Taylor Smith is a 52 y.o. 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  She was originally diagnosed after having a biopsy in the genitourinary area.  It came back consistent with clear cell renal cell carcinoma, which prompted an imaging study of the chest, abdomen, and pelvis.  She was found to have a massive left kidney mass with numerous small pulmonary nodules.  The renal mass appears to be involving the renal vein but not the inferior vena cava.    Prior to this, she was not having any symptoms.  Specifically, she did not have any hematuria or history of iron-deficiency anemia.  She does report back pain which has been going on for the past year but it is unclear whether that was related or not.    02/07/2022: The patient is here for follow up of stage IV kidney cancer.  She states that she tolerated the 1st treatment well.  She did have some achiness of the legs which was pretty brief in nature.    02/28/2022: The patient is here for follow up of stage IV kidney cancer.  She states that she has some pain in her legs for a few days following treatment.  She also reports that she is had a cough which has been present since her most recent surgery.  Today will be her 3rd cycle of treatment, and she has a CT scan scheduled for December 20.    03/21/2022:  The patient is here for follow up of metastatic renal cell carcinoma.  She was due for day 1 of cycle 4 of combination ipilimumab and nivolumab.  She was having some tolerability issues with the combination, but is  receiving additional premedications and states that it is helping to make the treatments easier.  She does not have any new issues at this time.    04/11/2022: The patient is here for follow up of metastatic renal cell cancer.  She has been on treatment with combination immunotherapy.  At last contact, her transaminases were mildly elevated.  On this visit, her bilirubin and transaminases are markedly higher.  She states that she did see a surgeon yesterday for consideration of nephrectomy.  She indicates that there was not a discussion regarding a jaundiced appearance at that time.  She had CT imaging performed approximately 2 days ago.    04/18/2022: The patient is here for follow up of metastatic renal cell carcinoma.  Immunotherapy has been on hold due to adverse liver function testing.  She has been on prednisone in her numbers have improved considerably.  She did go to the emergency room for an episode of abdominal pain recently and was prescribed Bentyl.    06/13/2022:  She states that she recently received Inlyta, and has not been on it for long.  She has a follow up visit with [facility] in early April.  She is having more issues with back pain, primarily when laying down.  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    ROS:   Review of Systems   Constitutional:  Negative for appetite change, chills and fatigue.   HENT:   Negative for sore throat and trouble swallowing.    Eyes:  Negative for eye problems.   Respiratory:  Negative for cough and shortness of breath.    Cardiovascular:  Negative for chest pain and leg swelling.   Gastrointestinal:  Negative for abdominal pain.   Genitourinary:  Negative for dysuria and hematuria.    Musculoskeletal:  Negative for arthralgias and gait problem.   Skin:  Negative for rash.   Neurological:  Negative for gait problem.   Hematological:  Negative for adenopathy.    Psychiatric/Behavioral:  Negative for depression.         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)     Kidney cancer, primary, with metastasis from kidney to other site, left (CMS [facility])          Past Surgical History:   Procedure Laterality Date    HX CESAREAN SECTION      HX CHOLECYSTECTOMY      HX CYST REMOVAL Right     HX HYSTERECTOMY      TOTAL THYROIDECTOMY           Social History     Socioeconomic History    Marital status: Married     Spouse name: Not on file    Number of children: Not on file    Years of education: Not on file    Highest education level: Not on file   Occupational History    Not on file   Tobacco Use    Smoking status: Never    Smokeless tobacco: Never   Vaping Use    Vaping status: Never Used   Substance and Sexual Activity    Alcohol use: Never    Drug use: Never    Sexual activity: Yes   Other Topics Concern    Not on file   Social History Narrative    Not on file     Social Determinants of Health     Financial Resource Strain: Not on file   Transportation Needs: Not on file   Social Connections: Not on file   Intimate Partner Violence: High Risk (01/13/2022)    Intimate Partner Violence     SDOH Domestic Violence: No   Housing Stability: Not on file     Family Medical History:       Problem Relation (Age of Onset)    Cancer Maternal Aunt, Maternal Grandmother    Colon Cancer Maternal Uncle    Lung Cancer Mother    Melanoma Maternal Aunt            Current Outpatient Medications   Medication Sig    apixaban (ELIQUIS) 5 mg Oral Tablet Take 1 Tablet (5 mg total) by mouth Twice daily    axitinib (INLYTA) 5 mg Oral Tablet Take 1 Tablet (5 mg total) by mouth Twice daily    cloNIDine HCL (CATAPRES) 0.1 mg Oral Tablet Take 1 Tablet (0.1 mg total) by mouth Once a day    ergocalciferol, vitamin D2, (DRISDOL) 1,250 mcg (50,000  unit) Oral Capsule Take 1 Capsule (50,000 Units total) by mouth Every 7 days    HYDROcodone-acetaminophen (NORCO) 5-325 mg Oral Tablet  Take 2 Tablets by mouth Every 4 hours as needed for Pain    Ibuprofen (MOTRIN) 600 mg Oral Tablet Take 1 Tablet (600 mg total) by mouth Every 6 hours as needed    levothyroxine (SYNTHROID) 112 mcg Oral Tablet Take 1 Tablet (112 mcg total) by mouth Every morning    levothyroxine (SYNTHROID) 125 mcg Oral Tablet TAKE 1 TABLET (125 MCG TOTAL) BY MOUTH ONCE A DAY FOR 90 DAYS    lisinopriL (PRINIVIL) 40 mg Oral Tablet TAKE 1 TABLET BY MOUTH EVERY DAY (Patient taking differently: Take 1 Tablet (40 mg total) by mouth Once a day)    omeprazole (PRILOSEC) 40 mg Oral Capsule, Delayed Release(E.C.) Take 1 Capsule (40 mg total) by mouth Once a day    predniSONE (DELTASONE) 10 mg Oral Tablet PLEASE SEE ATTACHED FOR DETAILED DIRECTIONS     No Known Allergies    PHYSICAL EXAM:  Most Recent Vitals    Flowsheet Row Telemedicine from 01/13/2022 in Hematology/Oncology,   [facility]   Temperature 36.7 C (98.1 F) filed at... 01/13/2022 1431   Heart Rate 72 filed at... 01/13/2022 1431   Respiratory Rate --   BP (Non-Invasive) 146/73 filed at... 01/13/2022 1431   SpO2 97 % filed at... 01/13/2022 1431   Height 1.753 m ('5\' 9"'$ ) filed at... 01/13/2022 1431   Weight 94.2 kg (207 lb 9.6 oz) filed at... 01/13/2022 1431   BMI (Calculated) 30.72 filed at... 01/13/2022 1431   BSA (Calculated) 2.14 filed at... 01/13/2022 1431      ECOG Status: (0) Fully active, able to carry on all predisease performance without restriction   Physical Exam  Constitutional:       General: She is not in acute distress.     Appearance: Normal appearance.   Eyes:      Extraocular Movements: Extraocular movements intact.   Cardiovascular:      Rate and Rhythm: Normal rate and regular rhythm.   Pulmonary:      Effort: Pulmonary effort is normal.   Abdominal:      General: Abdomen is flat.      Palpations: Abdomen is soft.    Musculoskeletal:         General: Normal range of motion.      Cervical back: Normal range of motion.   Skin:     General: Skin is warm and dry.   Neurological:      General: No focal deficit present.      Mental Status: She is alert.   Psychiatric:         Mood and Affect: Mood normal.         DIAGNOSTIC DATA:  No results found for this or any previous visit (from the past 17520 hour(s)).    LABS:   CBC  Diff   Lab Results   Component Value Date/Time    WBC 6.9 05/23/2022 08:51 AM    HGB 17.3 (H) 05/23/2022 08:51 AM    HCT 52.4 (H) 05/23/2022 08:51 AM    PLTCNT 138 (L) 05/23/2022 08:51 AM    RBC 5.91 (H) 05/23/2022 08:51 AM    MCV 88.7 05/23/2022 08:51 AM    MCHC 33.0 05/23/2022 08:51 AM    MCH 29.3 05/23/2022 08:51 AM    RDW 17.9 (H) 05/23/2022 08:51 AM    MPV 8.3 05/23/2022 08:51 AM    Lab Results   Component Value Date/Time    PMNS 71 05/23/2022 08:51 AM    LYMPHOCYTES 20 05/23/2022 08:51  AM    EOSINOPHIL 2 05/23/2022 08:51 AM    MONOCYTES 8 05/23/2022 08:51 AM    BASOPHILS 1 05/23/2022 08:51 AM    BASOPHILS 0.00 05/23/2022 08:51 AM    PMNABS 4.90 05/23/2022 08:51 AM    LYMPHSABS 1.40 05/23/2022 08:51 AM    EOSABS 0.10 05/23/2022 08:51 AM    MONOSABS 0.50 05/23/2022 08:51 AM            ASSESSMENT:    ICD-10-CM    1. Kidney cancer, primary, with metastasis from kidney to other site, left (CMS HCC)  C64.2 COMPREHENSIVE METABOLIC PANEL, NON-FASTING     CBC/DIFF      2. Elevated liver enzymes  R74.8 COMPREHENSIVE METABOLIC PANEL, NON-FASTING     CBC/DIFF           PLAN:   1. All relevant medical records were reviewed including available pertinent provider notes, procedure notes, imaging, laboratory, and pathology.   2. All pertinent labs and/or imaging were reviewed with the patient.   3. Stage IV renal carcinoma:  Continue Inlyta.  We will go ahead and get a Signatera test today and again in about a month.  Because she has not been on Inlyta for very long, it is possible that the upcoming scan could be misleading  and the Signatera testing could help determine whether she was responding to treatment or not.  We will arrange for reassessments on a monthly basis.  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  5. Pain:  Likely secondary to her malignancy.  We discussed various medications that can be attempted to try to alleviate her symptoms.    EMMAGENE SESTER was given the chance to ask questions, and these were answered to their satisfaction. The patient is welcome to call with any questions or concerns in the meantime.     On the day of the encounter, a total of 45 minutes was spent on this patient encounter including review of historical information, examination, documentation and post-visit activities.   Return in about 4 weeks (around 07/11/2022).     Narda Rutherford, MD  06/13/2022, 10:53  The patient's insurance company bears full legal and financial responsibility resulting from any deviations that they cause to my recommended treatment plan.   CC:  Eleanora Neighbor, FNP-C  [address]  [place] [address]    No referring provider defined for this encounter.    This note was partially generated using MModal Fluency Direct system, and there may be some incorrect words, spellings, and punctuation that were not noted in checking the note before saving.

## 2022-06-13 NOTE — Nurses Notes (Signed)
10:15-11:15;  Taylor Smith here today to have her labs drawn.  Labs drawn after seeing the doctor.  Labs drawn peripherally by myself, including Signatera and patient discharged to home.Reche Dixon, MA

## 2022-06-16 ENCOUNTER — Telehealth: Payer: 59 | Admitting: Medical

## 2022-06-16 DIAGNOSIS — Z712 Person consulting for explanation of examination or test findings: Secondary | ICD-10-CM

## 2022-06-16 NOTE — Progress Notes (Signed)
Taylor Smith  [id number]  [birth year]  06/16/2022     HEMATOLOGY/ONCOLOGY, [facility]  [place] [address]  Operated by [facility]  Telephone Visit    Name:  Taylor Smith MRN: [id number]   Date:  06/16/2022 Age:   52 y.o.     The patient/family initiated a request for telephone service.  Verbal consent for this service was obtained from the patient/family.    Last office visit in this department: 05/26/2022     Reason for call: GENETIC RESULTS disclosure    Call notes:      Total provider time spent with the patient on the phone: 5 minutes.      Called with genetic testing results. Results are negative for a hereditary cause to her cancer. She does have an elevated sporadic risk of breast cancer based on her family history of breast cancer in her mother per TC calculation                                 I repeated her IBIS today. I did calculate risk at 17% in her lifetime.           GAIL risk also <20% in her lifetime for breast cancer.         Given the calculations are both <20%, her genetic testing was negative and the fact that she has Stage IV kidney ca, I would recommend annual mammogram's, no breast MRI annually. Could reeval based on response to therapies for her renal ca. She already has annual mammo and clinical breast exam.     No follow up.     Kalisa Girtman L [place], [place], PA-C   [place] Department of Medicine   Section of Hematology/Oncology    CC:   Care Team       PCP       Name Type Specialty Phone Number    Narda Rutherford, MD Physician HEMATOLOGY-ONCOLOGY [phone]              Care Team       Name Type Specialty Phone Number    Addison Lank, RN Clinical Navigator Not available Not available

## 2022-06-23 ENCOUNTER — Emergency Department (HOSPITAL_BASED_OUTPATIENT_CLINIC_OR_DEPARTMENT_OTHER): Payer: 59

## 2022-06-23 ENCOUNTER — Emergency Department (EMERGENCY_DEPARTMENT_HOSPITAL): Payer: 59

## 2022-06-23 ENCOUNTER — Encounter (INDEPENDENT_AMBULATORY_CARE_PROVIDER_SITE_OTHER): Payer: 59 | Admitting: NURSE PRACTITIONER

## 2022-06-23 ENCOUNTER — Emergency Department
Admission: EM | Admit: 2022-06-23 | Discharge: 2022-06-23 | Disposition: A | Discharge status: Home / Self Care | Payer: 59 | Attending: Student in an Organized Health Care Education/Training Program | Admitting: Student in an Organized Health Care Education/Training Program

## 2022-06-23 ENCOUNTER — Encounter (HOSPITAL_BASED_OUTPATIENT_CLINIC_OR_DEPARTMENT_OTHER): Payer: Self-pay

## 2022-06-23 ENCOUNTER — Other Ambulatory Visit: Payer: Self-pay

## 2022-06-23 DIAGNOSIS — I10 Essential (primary) hypertension: Secondary | ICD-10-CM

## 2022-06-23 DIAGNOSIS — C649 Malignant neoplasm of unspecified kidney, except renal pelvis: Secondary | ICD-10-CM

## 2022-06-23 DIAGNOSIS — R079 Chest pain, unspecified: Secondary | ICD-10-CM

## 2022-06-23 DIAGNOSIS — C7800 Secondary malignant neoplasm of unspecified lung: Secondary | ICD-10-CM | POA: Insufficient documentation

## 2022-06-23 DIAGNOSIS — Z9221 Personal history of antineoplastic chemotherapy: Secondary | ICD-10-CM | POA: Insufficient documentation

## 2022-06-23 DIAGNOSIS — R072 Precordial pain: Secondary | ICD-10-CM

## 2022-06-23 DIAGNOSIS — J9811 Atelectasis: Secondary | ICD-10-CM | POA: Insufficient documentation

## 2022-06-23 DIAGNOSIS — Z85528 Personal history of other malignant neoplasm of kidney: Secondary | ICD-10-CM | POA: Insufficient documentation

## 2022-06-23 LAB — CBC WITH DIFF
BASOPHIL #: 0.01 10*3/uL (ref 0.00–0.30)
BASOPHIL %: 0 % (ref 0–3)
EOSINOPHIL #: 0.16 10*3/uL (ref 0.00–0.80)
EOSINOPHIL %: 2 % (ref 0–7)
HCT: 57.3 % — ABNORMAL HIGH (ref 37.0–47.0)
HGB: 19.1 g/dL — ABNORMAL HIGH (ref 12.5–16.0)
LYMPHOCYTE #: 2.77 10*3/uL (ref 1.10–5.00)
LYMPHOCYTE %: 28 % (ref 25–45)
MCH: 30.4 pg (ref 27.0–32.0)
MCHC: 33.3 g/dL (ref 32.0–36.0)
MCV: 91.3 fL (ref 78.0–99.0)
MONOCYTE #: 0.61 10*3/uL (ref 0.00–1.30)
MONOCYTE %: 6 % (ref 0–12)
MPV: 8.8 fL (ref 7.4–10.4)
NEUTROPHIL #: 6.43 10*3/uL (ref 1.80–8.40)
NEUTROPHIL %: 64 % (ref 40–76)
PLATELETS: 211 10*3/uL (ref 140–440)
RBC: 6.28 10*6/uL — ABNORMAL HIGH (ref 4.20–5.40)
RDW: 18 % — ABNORMAL HIGH (ref 11.6–14.8)
WBC: 10 10*3/uL (ref 4.0–10.5)

## 2022-06-23 LAB — BASIC METABOLIC PANEL
ANION GAP: 12 mmol/L (ref 4–13)
BUN/CREA RATIO: 19
BUN: 15 mg/dL (ref 7–18)
CALCIUM: 9.3 mg/dL (ref 8.5–10.1)
CHLORIDE: 104 mmol/L (ref 98–107)
CO2 TOTAL: 25 mmol/L (ref 21–32)
CREATININE: 0.8 mg/dL (ref 0.55–1.02)
ESTIMATED GFR: 88 mL/min/{1.73_m2} (ref >59)
GLUCOSE: 142 mg/dL — ABNORMAL HIGH (ref 74–106)
OSMOLALITY, CALCULATED: 285 mOsm/kg (ref 270–290)
POTASSIUM: 4.3 mmol/L (ref 3.5–5.1)
SODIUM: 141 mmol/L (ref 136–145)

## 2022-06-23 LAB — TROPONIN-I
TROPONIN I: 6 ng/L (ref <15)
TROPONIN I: 8 ng/L (ref <15)

## 2022-06-23 MED ORDER — FENTANYL (PF) 50 MCG/ML INJECTION SOLUTION
50.0000 ug | INTRAMUSCULAR | Status: AC
Start: 2022-06-23 — End: 2022-06-23
  Administered 2022-06-23: 50 ug via INTRAVENOUS

## 2022-06-23 MED ORDER — ONDANSETRON HCL (PF) 4 MG/2 ML INJECTION SOLUTION
INTRAMUSCULAR | Status: AC
Start: 2022-06-23 — End: 2022-06-23
  Filled 2022-06-23: qty 2

## 2022-06-23 MED ORDER — ONDANSETRON HCL (PF) 4 MG/2 ML INJECTION SOLUTION
4.0000 mg | INTRAMUSCULAR | Status: AC
Start: 2022-06-23 — End: 2022-06-23
  Administered 2022-06-23: 4 mg via INTRAVENOUS

## 2022-06-23 MED ORDER — ASPIRIN 81 MG CHEWABLE TABLET
CHEWABLE_TABLET | ORAL | Status: AC
Start: 2022-06-23 — End: 2022-06-23
  Filled 2022-06-23: qty 4

## 2022-06-23 MED ORDER — FENTANYL (PF) 50 MCG/ML INJECTION SOLUTION
INTRAMUSCULAR | Status: AC
Start: 2022-06-23 — End: 2022-06-23
  Filled 2022-06-23: qty 2

## 2022-06-23 MED ORDER — ASPIRIN 81 MG CHEWABLE TABLET
324.0000 mg | CHEWABLE_TABLET | ORAL | Status: AC
Start: 2022-06-23 — End: 2022-06-23
  Administered 2022-06-23: 324 mg via ORAL

## 2022-06-23 MED ORDER — IOHEXOL 350 MG IODINE/ML INTRAVENOUS SOLUTION
100.0000 mL | INTRAVENOUS | Status: AC
Start: 2022-06-23 — End: 2022-06-23
  Administered 2022-06-23: 100 mL via INTRAVENOUS

## 2022-06-23 NOTE — Discharge Instructions (Signed)
You were seen for chest pain. We performed labs, EKG and a CT scan that were reassuring. Your bedside echo was also fairly normal. Your pain improved.    We are discharging you at this time. Please keep your upcoming scheduled appointment with your physician. As discussed you may need to have further outpatient cardiac testing. If your symptoms return or progress, please come back to the emergency department.

## 2022-06-23 NOTE — ED Triage Notes (Addendum)
Pain in center of chest radiating into back since 0630. Pain started while drinking green tea. Denies shortness of breath,n/v. Pain eases when drinking water but then pain returns. Hx kidney cancer with mets to lung. Taking oral chemo

## 2022-06-23 NOTE — ED Provider Notes (Signed)
[facility] [place] Emergency Department    Name: Taylor Smith  Age and Gender: 52 y.o. female  PCP: Narda Rutherford, MD    Triage Note:   Chest Pain       HPI:  Taylor Smith is a 52 y.o. female  who presents to the ED today for chest pain.  Patient reports she was sitting drinking her green tea this morning when she had acute onset substernal chest pain.  Pain was sharp, 10/10 in severity, radiates to her back. No ripping or tearing pain. Took her breath away.  No nausea or vomiting, no diaphoresis but patient does report becoming flushed.  She states that she thought it might be reflux and tried to drink milk with minimal relief.  She also tried to eat something which worsened the pain.  Patient states she has been sipping on water but pain is persistent.  She does have a history of kidney cancer with metastatic disease to the lungs, on active chemotherapy.  She takes Eliquis for unknown renal clot.  She has not had a renal transplant.  She did not take any aspirin today.  Denies current SOB, N/V/D, abdominal pain, lightheadedness or dizziness, numbness/tingling or weakness.  Has not missed any doses of her medications. No recent lower extremity edema or long travel or immobilization.     History Limitations: None         ROS:  Pertinent ROS noted within HPI.      PHYSICAL EXAM:  Objective:  Nursing notes reviewed  ED Triage Vitals   BP (Non-Invasive) 06/23/22 0745 (!) 181/99   Heart Rate 06/23/22 0747 80   Respiratory Rate 06/23/22 0747 16   Temperature 06/23/22 0747 36.7 C (98 F)   SpO2 06/23/22 0747 95 %   Weight 06/23/22 0747 98.9 kg (218 lb)   Height 06/23/22 0747 1.753 m ('5\' 9"'$ )     Filed Vitals:    06/23/22 0945 06/23/22 1000 06/23/22 1015 06/23/22 1030   BP: 122/79 132/70 137/83 139/87   Pulse: 69 70 72 67   Resp: '16 15 19 '$ (!) 21   Temp:       SpO2: 93% 93% 95% 94%       Pertinent Physical Exam:  Constitutional: 52 y.o. female appears stated age in average/fair health.   Sitting up in bed, appears uncomfortable due to pain, holding chest.  HENT: Normocephalic, atraumatic.  Neck: Supple, trachea midline.  Cardiovascular: Regular rate and regular rhythm.  No murmurs.  Pulmonary/Chest: Normal work of breathing, no distress.  CTAB without wheezing or crackles.  Abdominal: Soft, non-distended.  Nontender.  Musculoskeletal: Normal ROM. No lower extremity edema.  Skin: Warm and dry. No rashes, erythema or cyanosis.  Psychiatric: Normal mood and affect.  Neurological: Alert and oriented. Conversing appropriately. Moving all extremities equally and fully with normal ROM. Sensation intact.      EKG: Sinus rhythm. HR 79BPM. Normal intervals.  New T-wave inversion in lead V1.  Mild new ST depression in lead III.  No ST elevations, no signs of acute ischemia or infarction.       MEDICAL DECISION MAKING    Course and MDM:  Patient seen and examined. Labs and imaging reviewed.  Taylor Smith is a 52 y.o. female presenting for chest pain    Acute onset substernal chest pain radiating to the back. Patient has had a cholecystectomy but reports this feels similar in nature. 10/10 and initially took her breath away, became  flushed. No N/V or diaphoresis. No hx of cardiac disease but has metastatic renal cancer with mets to the lungs. On Eliquis for renal vein clot but has not missed any doses of this.    On arrival, appears uncomfortable d/t pain, clutching mid chest. HTN to 180's, otherwise vitals hemodynamically appropriate. Patient's exam largely unremarkable.  EKG per my read does show new Twave inversions in lead III and V1. No ST elevations noted. Bedside echo with normal LV function, no RV strain. No pericardial effusion.  Patient given '324mg'$  ASA. Fentanyl and Zofran ordered for symptomatic relief.  Labs and CXR ordered.    Labs notable for normal WBC count at 10, Hgb 19.1, platelets normal. Electrolytes unremarkable with Cr 0.80. Trop negative.  CT PE study ordered for further assessment given  CP in the setting of known CA and hx of prior renal vein thrombosis. Imaging negative for PE. Notes improving metastatic lung disease. No other acute process identified. Discussed this with patient. Her symptoms have resolved. Delta trop ordered and negative.  Discussed overall workup. Patient feels back to her baseline, no persistent symptoms. We will plan for discharge with close outpatient follow up. Return precautions discussed and patient in agreement with plan for discharge. Has an appointment outpatient Thursday that she will attend. Discharged home.    Medical Decision Making  Problems Addressed:  Chest pain, unspecified type: acute illness or injury    Amount and/or Complexity of Data Reviewed  Labs: ordered.  Radiology: ordered and independent interpretation performed.  ECG/medicine tests: ordered and independent interpretation performed.    Risk  OTC drugs.  Prescription drug management.  Parenteral controlled substances.        The patient was discharged.      Disposition: Discharged    Follow up:   Narda Rutherford, MD  [address]  [place] [address]  [phone]      Please keep your scheduled upcoming appointment      Clinical Impression:     Clinical Impression   Chest pain, unspecified type (Primary)            /Corbin Hott "Lorri Frederick, MD 06/23/2022, 07:52   Department of Emergency Medicine  [facility]      *Parts of this patients chart were completed in a retrospective fashion due to simultaneous direct patient care activities in the Emergency Department.   *This note was partially generated using MModal Fluency Direct system, and there may be some incorrect words, spellings, and punctuation that were not noted in checking the note before saving.

## 2022-06-25 LAB — ECG 12 LEAD
Atrial Rate: 79 {beats}/min
Calculated P Axis: 57 degrees
Calculated R Axis: 103 degrees
Calculated T Axis: 42 degrees
PR Interval: 140 ms
QRS Duration: 104 ms
QT Interval: 386 ms
QTC Calculation: 442 ms
Ventricular rate: 79 {beats}/min

## 2022-06-26 ENCOUNTER — Encounter (INDEPENDENT_AMBULATORY_CARE_PROVIDER_SITE_OTHER): Payer: Self-pay | Admitting: NURSE PRACTITIONER

## 2022-06-26 ENCOUNTER — Other Ambulatory Visit: Payer: Self-pay

## 2022-06-26 ENCOUNTER — Ambulatory Visit (INDEPENDENT_AMBULATORY_CARE_PROVIDER_SITE_OTHER): Payer: 59 | Admitting: NURSE PRACTITIONER

## 2022-06-26 VITALS — BP 148/88 | HR 78 | Ht 69.02 in | Wt 225.6 lb

## 2022-06-26 DIAGNOSIS — Z0001 Encounter for general adult medical examination with abnormal findings: Secondary | ICD-10-CM

## 2022-06-26 DIAGNOSIS — E039 Hypothyroidism, unspecified: Secondary | ICD-10-CM

## 2022-06-26 DIAGNOSIS — I10 Essential (primary) hypertension: Secondary | ICD-10-CM

## 2022-06-26 DIAGNOSIS — Z0000 Encounter for general adult medical examination without abnormal findings: Secondary | ICD-10-CM

## 2022-06-26 DIAGNOSIS — K219 Gastro-esophageal reflux disease without esophagitis: Secondary | ICD-10-CM

## 2022-06-26 DIAGNOSIS — C642 Malignant neoplasm of left kidney, except renal pelvis: Secondary | ICD-10-CM

## 2022-06-26 DIAGNOSIS — Z136 Encounter for screening for cardiovascular disorders: Secondary | ICD-10-CM

## 2022-06-26 DIAGNOSIS — E559 Vitamin D deficiency, unspecified: Secondary | ICD-10-CM

## 2022-06-26 DIAGNOSIS — R072 Precordial pain: Secondary | ICD-10-CM

## 2022-06-26 MED ORDER — AMLODIPINE 5 MG TABLET
5.0000 mg | ORAL_TABLET | Freq: Every day | ORAL | 1 refills | Status: DC
Start: 2022-06-26 — End: 2022-07-21

## 2022-06-26 NOTE — Progress Notes (Signed)
INTERNAL MEDICINE, [facility]  [address]  [place] [address]       Name: Taylor Smith MRN:  [id number]   Date: 06/26/2022 Age: 52 y.o.       Chief Complaint:    Chief Complaint   Patient presents with    Follow Up 4 Months     4 month f/u. Pt went to the ER on 06/23/22 for chest pain. States she is feeling better. Pt has not yet made an appt with cardiology at this time. Pt has c/o of BP running high in the mornings.         HPI:  Taylor Smith is a 52 y.o. female who is here today for routine follow up/annual exam.     She continues to follow with Dr. Mackey/oncology - tx Kidney CA, next appt 3/22  She is also following with [facility], next appt 4/18  She follows with Dr. Buford Dresser for Pap Smears, and mammogram.     She is complaining today that she has recently been having trouble with her blood pressure staying  elevated especially in the morning time. She has been monitoring it at home and keeping a log on her phone. Systolic's have been ranging mainly from 130's -431'V, with diastolic's ranging mostly in the upper 80's. She has been compliant with taking her medications. She denies having any increase in headaches, no visual disturbance, she is complaining though that over the last several days she has had an increase in shortness of breath, especially with exertion.     She did also recently have an episode of what she describes at mid substernal chest pain on 3/4, where she was seen and elevated in the emergency room. She states that the pain just came out of no where and describes it as sharp and would not go away no matter what she did. Full chest pain workup completed in ER showed no acute cardiac abnormalities.  She states this has only occurred this one time and she has not experienced any further symptoms, and she is feeling much better but she has not yet scheduled further f/u with cardiology.     She denies any other current  problems or complaints.     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)     Kidney cancer, primary, with metastasis from kidney to other site, left (CMS [facility])          Past Surgical History:   Procedure Laterality Date    HX CESAREAN SECTION      HX CHOLECYSTECTOMY      HX CYST REMOVAL Right     HX HYSTERECTOMY      TOTAL THYROIDECTOMY        Current Outpatient Medications   Medication Sig    amLODIPine (NORVASC) 5 mg Oral Tablet Take 1 Tablet (5 mg total) by mouth Once a day Indications: high blood pressure    apixaban (ELIQUIS) 5 mg Oral Tablet Take 1 Tablet (5 mg total) by mouth Twice daily    axitinib (INLYTA) 5 mg Oral Tablet Take 1 Tablet (5 mg total) by mouth Twice daily    cloNIDine HCL (CATAPRES) 0.1 mg Oral Tablet Take 1 Tablet (0.1 mg total) by mouth Once a day    ergocalciferol,  vitamin D2, (DRISDOL) 1,250 mcg (50,000 unit) Oral Capsule Take 1 Capsule (50,000 Units total) by mouth Every 7 days    HYDROcodone-acetaminophen (NORCO) 5-325 mg Oral Tablet Take 2 Tablets by mouth Every 4 hours as needed for Pain (Patient not taking: Reported on 06/26/2022)    Ibuprofen (MOTRIN) 600 mg Oral Tablet Take 1 Tablet (600 mg total) by mouth Every 6 hours as needed    levothyroxine (SYNTHROID) 112 mcg Oral Tablet Take 1 Tablet (112 mcg total) by mouth Every morning    levothyroxine (SYNTHROID) 125 mcg Oral Tablet TAKE 1 TABLET (125 MCG TOTAL) BY MOUTH ONCE A DAY FOR 90 DAYS (Patient not taking: Reported on 06/26/2022)    lisinopriL (PRINIVIL) 40 mg Oral Tablet TAKE 1 TABLET BY MOUTH EVERY DAY (Patient taking differently: Take 1 Tablet (40 mg total) by mouth Once a day)    omeprazole (PRILOSEC) 40 mg Oral Capsule, Delayed Release(E.C.) Take 1 Capsule (40 mg total) by mouth Once a day    predniSONE (DELTASONE) 10 mg Oral Tablet PLEASE SEE ATTACHED FOR DETAILED DIRECTIONS     No Known  Allergies    Family History:  Family Medical History:       Problem Relation (Age of Onset)    Cancer Maternal Aunt, Maternal Grandmother    Colon Cancer Maternal Uncle    Lung Cancer Mother    Melanoma Maternal Aunt              Social History:   Social History     Tobacco Use   Smoking Status Never   Smokeless Tobacco Never     Social History     Substance and Sexual Activity   Alcohol Use None     Social History     Occupational History    Not on file       Review of Systems:  Review of systems as discussed in HPI    Problem List:  Patient Active Problem List   Diagnosis    Vitamin D deficiency    Hypothyroid    GERD (gastroesophageal reflux disease)    Primary hypertension    Neural foraminal stenosis of cervical spine    Osteophyte of cervical spine    S/P hysterectomy    Kidney cancer, primary, with metastasis from kidney to other site, left (CMS [facility])       Physical Examination:  BP (!) 148/88 (Site: Left, Patient Position: Sitting, Cuff Size: Adult)   Pulse 78   Ht 1.753 m (5' 9.02")   Wt 102 kg (225 lb 9.6 oz)   SpO2 93%   BMI 33.30 kg/m       Physical Exam  Vitals and nursing note reviewed.   Constitutional:       General: She is not in acute distress.     Appearance: Normal appearance. She is not diaphoretic.   HENT:      Head: Normocephalic and atraumatic.   Eyes:      Extraocular Movements: Extraocular movements intact.      Pupils: Pupils are equal, round, and reactive to light.   Cardiovascular:      Rate and Rhythm: Normal rate.      Pulses: Normal pulses.           Radial pulses are 2+ on the right side and 2+ on the left side.        Dorsalis pedis pulses are 2+ on the right side and 2+ on the left side.  Posterior tibial pulses are 2+ on the right side and 2+ on the left side.      Heart sounds: Normal heart sounds, S1 normal and S2 normal.   Pulmonary:      Effort: Pulmonary effort is normal.      Breath sounds: Normal breath sounds.   Abdominal:      General: Abdomen is flat. Bowel  sounds are normal.      Palpations: Abdomen is soft.   Musculoskeletal:      Cervical back: Normal range of motion.      Right lower leg: No edema.      Left lower leg: No edema.   Skin:     General: Skin is warm and dry.      Capillary Refill: Capillary refill takes less than 2 seconds.   Neurological:      General: No focal deficit present.      Mental Status: She is alert and oriented to person, place, and time.        Data Reviewed:  Admission on 06/23/2022, Discharged on 06/23/2022   Component Date Value Ref Range Status    SODIUM 06/23/2022 141  136 - 145 mmol/L Final    POTASSIUM 06/23/2022 4.3  3.5 - 5.1 mmol/L Final    CHLORIDE 06/23/2022 104  98 - 107 mmol/L Final    CO2 TOTAL 06/23/2022 25  21 - 32 mmol/L Final    ANION GAP 06/23/2022 12  4 - 13 mmol/L Final    CALCIUM 06/23/2022 9.3  8.5 - 10.1 mg/dL Final    GLUCOSE 06/23/2022 142 (H)  74 - 106 mg/dL Final    BUN 06/23/2022 15  7 - 18 mg/dL Final    CREATININE 06/23/2022 0.80  0.55 - 1.02 mg/dL Final    BUN/CREA RATIO 06/23/2022 19   Final    ESTIMATED GFR 06/23/2022 88  >59 mL/min/1.37m^2 Final    OSMOLALITY, CALCULATED 06/23/2022 285  270 - 290 mOsm/kg Final    TROPONIN I 06/23/2022 6  <15 ng/L Final    Ventricular rate 06/23/2022 79  BPM Final    Atrial Rate 06/23/2022 79  BPM Final    PR Interval 06/23/2022 140  ms Final    QRS Duration 06/23/2022 104  ms Final    QT Interval 06/23/2022 386  ms Final    QTC Calculation 06/23/2022 442  ms Final    Calculated P Axis 06/23/2022 57  degrees Final    Calculated R Axis 06/23/2022 103  degrees Final    Calculated T Axis 06/23/2022 42  degrees Final    WBC 06/23/2022 10.0  4.0 - 10.5 x10^3/uL Final    RBC 06/23/2022 6.28 (H)  4.20 - 5.40 x10^6/uL Final    HGB 06/23/2022 19.1 (H)  12.5 - 16.0 g/dL Final    HCT 06/23/2022 57.3 (H)  37.0 - 47.0 % Final    MCV 06/23/2022 91.3  78.0 - 99.0 fL Final    MCH 06/23/2022 30.4  27.0 - 32.0 pg Final    MCHC 06/23/2022 33.3  32.0 - 36.0 g/dL Final    RDW 06/23/2022 18.0  (H)  11.6 - 14.8 % Final    PLATELETS 06/23/2022 211  140 - 440 x10^3/uL Final    MPV 06/23/2022 8.8  7.4 - 10.4 fL Final    NEUTROPHIL % 06/23/2022 64  40 - 76 % Final    LYMPHOCYTE % 06/23/2022 28  25 - 45 % Final    MONOCYTE % 06/23/2022  6  0 - 12 % Final    EOSINOPHIL % 06/23/2022 2  0 - 7 % Final    BASOPHIL % 06/23/2022 0  0 - 3 % Final    NEUTROPHIL # 06/23/2022 6.43  1.80 - 8.40 x10^3/uL Final    LYMPHOCYTE # 06/23/2022 2.77  1.10 - 5.00 x10^3/uL Final    MONOCYTE # 06/23/2022 0.61  0.00 - 1.30 x10^3/uL Final    EOSINOPHIL # 06/23/2022 0.16  0.00 - 0.80 x10^3/uL Final    BASOPHIL # 06/23/2022 0.01  0.00 - 0.30 x10^3/uL Final    TROPONIN I 06/23/2022 8  <15 ng/L Final   Hospital Outpatient Visit on 06/13/2022   Component Date Value Ref Range Status    SODIUM 06/13/2022 140  136 - 145 mmol/L Final    POTASSIUM 06/13/2022 3.8  3.5 - 5.1 mmol/L Final    CHLORIDE 06/13/2022 103  98 - 107 mmol/L Final    CO2 TOTAL 06/13/2022 31  21 - 31 mmol/L Final    ANION GAP 06/13/2022 6  4 - 13 mmol/L Final    BUN 06/13/2022 12  7 - 25 mg/dL Final    CREATININE 06/13/2022 0.64  0.60 - 1.30 mg/dL Final    BUN/CREA RATIO 06/13/2022 19  6 - 22 Final    ESTIMATED GFR 06/13/2022 106  >59 mL/min/1.53m^2 Final    ALBUMIN 06/13/2022 4.0  3.5 - 5.7 g/dL Final    CALCIUM 06/13/2022 9.4  8.6 - 10.3 mg/dL Final    GLUCOSE 06/13/2022 70 (L)  74 - 109 mg/dL Final    ALKALINE PHOSPHATASE 06/13/2022 67  34 - 104 U/L Final    ALT (SGPT) 06/13/2022 117 (H)  7 - 52 U/L Final    AST (SGOT) 06/13/2022 58 (H)  13 - 39 U/L Final    BILIRUBIN TOTAL 06/13/2022 0.8  0.3 - 1.2 mg/dL Final    PROTEIN TOTAL 06/13/2022 7.3  6.4 - 8.9 g/dL Final    ALBUMIN/GLOBULIN RATIO 06/13/2022 1.2  0.8 - 1.4 Final    OSMOLALITY, CALCULATED 06/13/2022 278  270 - 290 mOsm/kg Final    CALCIUM, CORRECTED 06/13/2022 9.4  8.9 - 10.8 mg/dL Final    GLOBULIN 06/13/2022 3.3  2.9 - 5.4 Final    WBC 06/13/2022 11.3  3.8 - 11.8 x10^3/uL Final    RBC 06/13/2022 6.06 (H)  3.63 -  4.92 x10^6/uL Final    HGB 06/13/2022 18.1 (H)  10.9 - 14.3 g/dL Final    HCT 06/13/2022 54.1 (H)  31.2 - 41.9 % Final    MCV 06/13/2022 89.1  75.5 - 95.3 fL Final    MCH 06/13/2022 29.8  24.7 - 32.8 pg Final    MCHC 06/13/2022 33.4  32.3 - 35.6 g/dL Final    RDW 06/13/2022 17.7  12.3 - 17.7 % Final    PLATELETS 06/13/2022 173  140 - 440 x10^3/uL Final    MPV 06/13/2022 8.9  7.9 - 10.8 fL Final    NEUTROPHIL % 06/13/2022 63  43 - 77 % Final    LYMPHOCYTE % 06/13/2022 27  16 - 44 % Final    MONOCYTE % 06/13/2022 8  5 - 13 % Final    EOSINOPHIL % 06/13/2022 1  % Final    BASOPHIL % 06/13/2022 1  0 - 1 % Final    NEUTROPHIL # 06/13/2022 7.10  1.85 - 7.80 x10^3/uL Final    LYMPHOCYTE # 06/13/2022 3.10 (H)  1.00 - 3.00 x10^3/uL Final  MONOCYTE # 06/13/2022 0.90  0.30 - 1.00 x10^3/uL Final    EOSINOPHIL # 06/13/2022 0.10  0.00 - 0.50 x10^3/uL Final    BASOPHIL # 06/13/2022 0.10  0.00 - 0.10 x10^3/uL Final   Appointment on 05/28/2022   Component Date Value Ref Range Status    TSH 05/28/2022 5.079  0.450 - 5.330 uIU/mL Final    SODIUM 05/28/2022 140  136 - 145 mmol/L Final    POTASSIUM 05/28/2022 4.6  3.5 - 5.1 mmol/L Final    CHLORIDE 05/28/2022 102  98 - 107 mmol/L Final    CO2 TOTAL 05/28/2022 33 (H)  21 - 31 mmol/L Final    ANION GAP 05/28/2022 5  4 - 13 mmol/L Final    BUN 05/28/2022 10  7 - 25 mg/dL Final    CREATININE 05/28/2022 0.67  0.60 - 1.30 mg/dL Final    BUN/CREA RATIO 05/28/2022 15  6 - 22 Final    ESTIMATED GFR 05/28/2022 105  >59 mL/min/1.64m^2 Final    ALBUMIN 05/28/2022 4.0  3.5 - 5.7 g/dL Final    CALCIUM 05/28/2022 9.9  8.6 - 10.3 mg/dL Final    GLUCOSE 05/28/2022 118 (H)  74 - 109 mg/dL Final    ALKALINE PHOSPHATASE 05/28/2022 86  34 - 104 U/L Final    ALT (SGPT) 05/28/2022 388 (H)  7 - 52 U/L Final    AST (SGOT) 05/28/2022 271 (H)  13 - 39 U/L Final    BILIRUBIN TOTAL 05/28/2022 1.5 (H)  0.3 - 1.2 mg/dL Final    PROTEIN TOTAL 05/28/2022 7.5  6.4 - 8.9 g/dL Final    ALBUMIN/GLOBULIN RATIO 05/28/2022  1.1  0.8 - 1.4 Final    OSMOLALITY, CALCULATED 05/28/2022 280  270 - 290 mOsm/kg Final    CALCIUM, CORRECTED 05/28/2022 9.9  8.9 - 10.8 mg/dL Final    GLOBULIN 05/28/2022 3.5  2.9 - 5.4 Final   Telemedicine on 05/26/2022   Component Date Value Ref Range Status    REPORT SUMMARY 05/26/2022 NEGATIVE   Final    Negative for 81 out of 81 genes.  No known pathogenic or likely pathogenic variants were detected in the 81 genes analyzed. A Tyrer-Cuzick breast cancer risk assessment was performed and a lifetime breast cancer risk was calculated to be  > = 20%    FOOTNOTES 05/26/2022 See Notes   Final    CLIA: ID #[id number]  Test performed by [place] [address]   J. Leanne Chang, Ph.D., [facility], Laboratory Director   Appointment on 05/23/2022   Component Date Value Ref Range Status    SODIUM 05/23/2022 140  136 - 145 mmol/L Final    POTASSIUM 05/23/2022 4.2  3.5 - 5.1 mmol/L Final    CHLORIDE 05/23/2022 103  98 - 107 mmol/L Final    CO2 TOTAL 05/23/2022 30  21 - 31 mmol/L Final    ANION GAP 05/23/2022 7  4 - 13 mmol/L Final    BUN 05/23/2022 11  7 - 25 mg/dL Final    CREATININE 05/23/2022 0.74  0.60 - 1.30 mg/dL Final    BUN/CREA RATIO 05/23/2022 15  6 - 22 Final    ESTIMATED GFR 05/23/2022 97  >59 mL/min/1.55m^2 Final    ALBUMIN 05/23/2022 4.0  3.5 - 5.7 g/dL Final    CALCIUM 05/23/2022 9.8  8.6 - 10.3 mg/dL Final    GLUCOSE 05/23/2022 162 (H)  74 - 109 mg/dL Final    ALKALINE PHOSPHATASE  05/23/2022 84  34 - 104 U/L Final    ALT (SGPT) 05/23/2022 342 (H)  7 - 52 U/L Final    AST (SGOT) 05/23/2022 232 (H)  13 - 39 U/L Final    BILIRUBIN TOTAL 05/23/2022 1.1  0.3 - 1.2 mg/dL Final    PROTEIN TOTAL 05/23/2022 7.1  6.4 - 8.9 g/dL Final    ALBUMIN/GLOBULIN RATIO 05/23/2022 1.3  0.8 - 1.4 Final    OSMOLALITY, CALCULATED 05/23/2022 282  270 - 290 mOsm/kg Final    CALCIUM, CORRECTED 05/23/2022 9.8  8.9 - 10.8 mg/dL Final    GLOBULIN 05/23/2022 3.1  2.9 - 5.4 Final    WBC 05/23/2022 6.9   3.8 - 11.8 x10^3/uL Final    RBC 05/23/2022 5.91 (H)  3.63 - 4.92 x10^6/uL Final    HGB 05/23/2022 17.3 (H)  10.9 - 14.3 g/dL Final    HCT 05/23/2022 52.4 (H)  31.2 - 41.9 % Final    MCV 05/23/2022 88.7  75.5 - 95.3 fL Final    MCH 05/23/2022 29.3  24.7 - 32.8 pg Final    MCHC 05/23/2022 33.0  32.3 - 35.6 g/dL Final    RDW 05/23/2022 17.9 (H)  12.3 - 17.7 % Final    PLATELETS 05/23/2022 138 (L)  140 - 440 x10^3/uL Final    MPV 05/23/2022 8.3  7.9 - 10.8 fL Final    NEUTROPHIL % 05/23/2022 71  43 - 77 % Final    LYMPHOCYTE % 05/23/2022 20  16 - 44 % Final    MONOCYTE % 05/23/2022 8  5 - 13 % Final    EOSINOPHIL % 05/23/2022 2  % Final    BASOPHIL % 05/23/2022 1  0 - 1 % Final    NEUTROPHIL # 05/23/2022 4.90  1.85 - 7.80 x10^3/uL Final    LYMPHOCYTE # 05/23/2022 1.40  1.00 - 3.00 x10^3/uL Final    MONOCYTE # 05/23/2022 0.50  0.30 - 1.00 x10^3/uL Final    EOSINOPHIL # 05/23/2022 0.10  0.00 - 0.50 x10^3/uL Final    BASOPHIL # 05/23/2022 0.00  0.00 - 0.10 x10^3/uL Final   Hospital Outpatient Visit on 05/16/2022   Component Date Value Ref Range Status    SODIUM 05/16/2022 141  136 - 145 mmol/L Final    POTASSIUM 05/16/2022 3.5  3.5 - 5.1 mmol/L Final    CHLORIDE 05/16/2022 103  98 - 107 mmol/L Final    CO2 TOTAL 05/16/2022 31  21 - 31 mmol/L Final    ANION GAP 05/16/2022 7  4 - 13 mmol/L Final    BUN 05/16/2022 6 (L)  7 - 25 mg/dL Final    CREATININE 05/16/2022 0.66  0.60 - 1.30 mg/dL Final    BUN/CREA RATIO 05/16/2022 9  6 - 22 Final    ESTIMATED GFR 05/16/2022 105  >59 mL/min/1.42m^2 Final    ALBUMIN 05/16/2022 3.9  3.5 - 5.7 g/dL Final    CALCIUM 05/16/2022 9.3  8.6 - 10.3 mg/dL Final    GLUCOSE 05/16/2022 115 (H)  74 - 109 mg/dL Final    ALKALINE PHOSPHATASE 05/16/2022 91  34 - 104 U/L Final    ALT (SGPT) 05/16/2022 243 (H)  7 - 52 U/L Final    AST (SGOT) 05/16/2022 135 (H)  13 - 39 U/L Final    BILIRUBIN TOTAL 05/16/2022 0.9  0.3 - 1.2 mg/dL Final    PROTEIN TOTAL 05/16/2022 7.0  6.4 - 8.9 g/dL Final     ALBUMIN/GLOBULIN RATIO 05/16/2022  1.3  0.8 - 1.4 Final    OSMOLALITY, CALCULATED 05/16/2022 280  270 - 290 mOsm/kg Final    CALCIUM, CORRECTED 05/16/2022 9.4  8.9 - 10.8 mg/dL Final    GLOBULIN 05/16/2022 3.1  2.9 - 5.4 Final    WBC 05/16/2022 5.4  3.8 - 11.8 x10^3/uL Final    RBC 05/16/2022 5.95 (H)  3.63 - 4.92 x10^6/uL Final    HGB 05/16/2022 17.4 (H)  10.9 - 14.3 g/dL Final    HCT 05/16/2022 52.5 (H)  31.2 - 41.9 % Final    MCV 05/16/2022 88.3  75.5 - 95.3 fL Final    MCH 05/16/2022 29.2  24.7 - 32.8 pg Final    MCHC 05/16/2022 33.1  32.3 - 35.6 g/dL Final    RDW 05/16/2022 17.3  12.3 - 17.7 % Final    PLATELETS 05/16/2022 143  140 - 440 x10^3/uL Final    MPV 05/16/2022 8.5  7.9 - 10.8 fL Final    NEUTROPHIL % 05/16/2022 57  43 - 77 % Final    LYMPHOCYTE % 05/16/2022 30  16 - 44 % Final    MONOCYTE % 05/16/2022 10  5 - 13 % Final    EOSINOPHIL % 05/16/2022 2  % Final    BASOPHIL % 05/16/2022 1  0 - 1 % Final    NEUTROPHIL # 05/16/2022 3.10  1.85 - 7.80 x10^3/uL Final    LYMPHOCYTE # 05/16/2022 1.60  1.00 - 3.00 x10^3/uL Final    MONOCYTE # 05/16/2022 0.60  0.30 - 1.00 x10^3/uL Final    EOSINOPHIL # 05/16/2022 0.10  0.00 - 0.50 x10^3/uL Final    BASOPHIL # 05/16/2022 0.00  0.00 - 0.10 x10^3/uL Final   Hospital Outpatient Visit on 04/18/2022   Component Date Value Ref Range Status    SODIUM 04/18/2022 136  136 - 145 mmol/L Final    POTASSIUM 04/18/2022 3.9  3.5 - 5.1 mmol/L Final    CHLORIDE 04/18/2022 102  98 - 107 mmol/L Final    CO2 TOTAL 04/18/2022 27  21 - 31 mmol/L Final    ANION GAP 04/18/2022 7  4 - 13 mmol/L Final    BUN 04/18/2022 9  7 - 25 mg/dL Final    CREATININE 04/18/2022 0.63  0.60 - 1.30 mg/dL Final    BUN/CREA RATIO 04/18/2022 14  6 - 22 Final    ESTIMATED GFR 04/18/2022 107  >59 mL/min/1.3m^2 Final    ALBUMIN 04/18/2022 3.7  3.5 - 5.7 g/dL Final    CALCIUM 04/18/2022 9.2  8.6 - 10.3 mg/dL Final    GLUCOSE 04/18/2022 134 (H)  74 - 109 mg/dL Final    ALKALINE PHOSPHATASE 04/18/2022 170 (H)  34 -  104 U/L Final    ALT (SGPT) 04/18/2022 438 (H)  7 - 52 U/L Final    AST (SGOT) 04/18/2022 264 (H)  13 - 39 U/L Final    BILIRUBIN TOTAL 04/18/2022 3.3 (H)  0.3 - 1.2 mg/dL Final    PROTEIN TOTAL 04/18/2022 7.3  6.4 - 8.9 g/dL Final    ALBUMIN/GLOBULIN RATIO 04/18/2022 1.0  0.8 - 1.4 Final    OSMOLALITY, CALCULATED 04/18/2022 273  270 - 290 mOsm/kg Final    CALCIUM, CORRECTED 04/18/2022 9.4  8.9 - 10.8 mg/dL Final    GLOBULIN 04/18/2022 3.6  2.9 - 5.4 Final    WBC 04/18/2022 6.5  3.8 - 11.8 x10^3/uL Final    RBC 04/18/2022 5.96 (H)  3.63 - 4.92 x10^6/uL Final  HGB 04/18/2022 17.0 (H)  10.9 - 14.3 g/dL Final    HCT 04/18/2022 52.2 (H)  31.2 - 41.9 % Final    MCV 04/18/2022 87.5  75.5 - 95.3 fL Final    MCH 04/18/2022 28.6  24.7 - 32.8 pg Final    MCHC 04/18/2022 32.6  32.3 - 35.6 g/dL Final    RDW 04/18/2022 17.0  12.3 - 17.7 % Final    PLATELETS 04/18/2022 119 (L)  140 - 440 x10^3/uL Final    MPV 04/18/2022 9.6  7.9 - 10.8 fL Final    NEUTROPHIL % 04/18/2022 84 (H)  43 - 77 % Final    LYMPHOCYTE % 04/18/2022 7 (L)  16 - 44 % Final    MONOCYTE % 04/18/2022 7  5 - 13 % Final    EOSINOPHIL % 04/18/2022 2  % Final    BASOPHIL % 04/18/2022 0  0 - 1 % Final    NEUTROPHIL # 04/18/2022 5.50  1.85 - 7.80 x10^3/uL Final    LYMPHOCYTE # 04/18/2022 0.40 (L)  1.00 - 3.00 x10^3/uL Final    MONOCYTE # 04/18/2022 0.40  0.30 - 1.00 x10^3/uL Final    EOSINOPHIL # 04/18/2022 0.10  0.00 - 0.50 x10^3/uL Final    BASOPHIL # 04/18/2022 0.00  0.00 - 0.10 x10^3/uL Final   Admission on 04/16/2022, Discharged on 04/16/2022   Component Date Value Ref Range Status    C-REACTIVE PROTEIN (CRP) 04/16/2022 0.4  0.1 - 0.5 mg/dL Final    SODIUM 04/16/2022 140  136 - 145 mmol/L Final    POTASSIUM 04/16/2022 4.1  3.5 - 5.1 mmol/L Final    CHLORIDE 04/16/2022 103  98 - 107 mmol/L Final    CO2 TOTAL 04/16/2022 28  21 - 31 mmol/L Final    ANION GAP 04/16/2022 9  4 - 13 mmol/L Final    BUN 04/16/2022 15  7 - 25 mg/dL Final    CREATININE 04/16/2022 0.60   0.60 - 1.30 mg/dL Final    BUN/CREA RATIO 04/16/2022 25 (H)  6 - 22 Final    ESTIMATED GFR 04/16/2022 108  >59 mL/min/1.39m^2 Final    ALBUMIN 04/16/2022 4.2  3.5 - 5.7 g/dL Final    CALCIUM 04/16/2022 9.9  8.6 - 10.3 mg/dL Final    GLUCOSE 04/16/2022 77  74 - 109 mg/dL Final    ALKALINE PHOSPHATASE 04/16/2022 189 (H)  34 - 104 U/L Final    ALT (SGPT) 04/16/2022 578 (H)  7 - 52 U/L Final    AST (SGOT) 04/16/2022 246 (H)  13 - 39 U/L Final    BILIRUBIN TOTAL 04/16/2022 3.6 (H)  0.3 - 1.2 mg/dL Final    PROTEIN TOTAL 04/16/2022 8.3  6.4 - 8.9 g/dL Final    ALBUMIN/GLOBULIN RATIO 04/16/2022 1.0  0.8 - 1.4 Final    OSMOLALITY, CALCULATED 04/16/2022 279  270 - 290 mOsm/kg Final    CALCIUM, CORRECTED 04/16/2022 9.7  8.9 - 10.8 mg/dL Final    GLOBULIN 04/16/2022 4.1  2.9 - 5.4 Final    LACTIC ACID 04/16/2022 1.2  0.5 - 2.2 mmol/L Final    LIPASE 04/16/2022 32  11 - 82 U/L Final    Ventricular rate 04/16/2022 63  BPM Final    Atrial Rate 04/16/2022 63  BPM Final    PR Interval 04/16/2022 172  ms Final    QRS Duration 04/16/2022 106  ms Final    QT Interval 04/16/2022 432  ms Final    QTC  Calculation 04/16/2022 442  ms Final    Calculated P Axis 04/16/2022 77  degrees Final    Calculated R Axis 04/16/2022 92  degrees Final    Calculated T Axis 04/16/2022 58  degrees Final    TROPONIN I 04/16/2022 4  <15 ng/L Final    WBC 04/16/2022 13.7 (H)  3.8 - 11.8 x10^3/uL Final    RBC 04/16/2022 5.93 (H)  3.63 - 4.92 x10^6/uL Final    HGB 04/16/2022 17.4 (H)  10.9 - 14.3 g/dL Final    HCT 04/16/2022 52.4 (H)  31.2 - 41.9 % Final    MCV 04/16/2022 88.3  75.5 - 95.3 fL Final    MCH 04/16/2022 29.4  24.7 - 32.8 pg Final    MCHC 04/16/2022 33.3  32.3 - 35.6 g/dL Final    RDW 04/16/2022 17.2  12.3 - 17.7 % Final    PLATELETS 04/16/2022 132 (L)  140 - 440 x10^3/uL Final    MPV 04/16/2022 9.9  7.9 - 10.8 fL Final    NEUTROPHIL % 04/16/2022 84 (H)  43 - 77 % Final    LYMPHOCYTE % 04/16/2022 8 (L)  16 - 44 % Final    MONOCYTE % 04/16/2022 8  5  - 13 % Final    EOSINOPHIL % 04/16/2022 0 (L)  % Final    BASOPHIL % 04/16/2022 0  0 - 1 % Final    NEUTROPHIL # 04/16/2022 11.50 (H)  1.85 - 7.80 x10^3/uL Final    LYMPHOCYTE # 04/16/2022 1.10  1.00 - 3.00 x10^3/uL Final    MONOCYTE # 04/16/2022 1.10 (H)  0.30 - 1.00 x10^3/uL Final    EOSINOPHIL # 04/16/2022 0.00  0.00 - 0.50 x10^3/uL Final    BASOPHIL # 04/16/2022 0.00  0.00 - 0.10 x10^3/uL Final   Hospital Outpatient Visit on 04/11/2022   Component Date Value Ref Range Status    SODIUM 04/11/2022 136  136 - 145 mmol/L Final    POTASSIUM 04/11/2022 3.8  3.5 - 5.1 mmol/L Final    CHLORIDE 04/11/2022 104  98 - 107 mmol/L Final    CO2 TOTAL 04/11/2022 25  21 - 31 mmol/L Final    ANION GAP 04/11/2022 7  4 - 13 mmol/L Final    BUN 04/11/2022 7  7 - 25 mg/dL Final    CREATININE 04/11/2022 0.70  0.60 - 1.30 mg/dL Final    BUN/CREA RATIO 04/11/2022 10  6 - 22 Final    ESTIMATED GFR 04/11/2022 104  >59 mL/min/1.53m^2 Final    ALBUMIN 04/11/2022 3.8  3.5 - 5.7 g/dL Final    CALCIUM 04/11/2022 9.3  8.6 - 10.3 mg/dL Final    GLUCOSE 04/11/2022 166 (H)  74 - 109 mg/dL Final    ALKALINE PHOSPHATASE 04/11/2022 255 (H)  34 - 104 U/L Final    ALT (SGPT) 04/11/2022 1,133 (H)  7 - 52 U/L Final    AST (SGOT) 04/11/2022 1,486 (H)  13 - 39 U/L Final    BILIRUBIN TOTAL 04/11/2022 8.9 (H)  0.3 - 1.2 mg/dL Final    PROTEIN TOTAL 04/11/2022 7.6  6.4 - 8.9 g/dL Final    ALBUMIN/GLOBULIN RATIO 04/11/2022 1.0  0.8 - 1.4 Final    OSMOLALITY, CALCULATED 04/11/2022 274  270 - 290 mOsm/kg Final    CALCIUM, CORRECTED 04/11/2022 9.5  8.9 - 10.8 mg/dL Final    GLOBULIN 04/11/2022 3.8  2.9 - 5.4 Final    MAGNESIUM 04/11/2022 1.8 (L)  1.9 - 2.7 mg/dL Final  TSH 04/11/2022 0.647  0.450 - 5.330 uIU/mL Final    WBC 04/11/2022 4.1  3.8 - 11.8 x10^3/uL Final    RBC 04/11/2022 5.77 (H)  3.63 - 4.92 x10^6/uL Final    HGB 04/11/2022 16.5 (H)  10.9 - 14.3 g/dL Final    HCT 04/11/2022 50.4 (H)  31.2 - 41.9 % Final    MCV 04/11/2022 87.4  75.5 - 95.3 fL Final     MCH 04/11/2022 28.7  24.7 - 32.8 pg Final    MCHC 04/11/2022 32.8  32.3 - 35.6 g/dL Final    RDW 04/11/2022 16.6  12.3 - 17.7 % Final    PLATELETS 04/11/2022 125 (L)  140 - 440 x10^3/uL Final    MPV 04/11/2022 9.5  7.9 - 10.8 fL Final    NEUTROPHIL % 04/11/2022 68  43 - 77 % Final    LYMPHOCYTE % 04/11/2022 18  16 - 44 % Final    MONOCYTE % 04/11/2022 10  5 - 13 % Final    EOSINOPHIL % 04/11/2022 3  % Final    BASOPHIL % 04/11/2022 1  0 - 1 % Final    NEUTROPHIL # 04/11/2022 2.80  1.85 - 7.80 x10^3/uL Final    LYMPHOCYTE # 04/11/2022 0.70 (L)  1.00 - 3.00 x10^3/uL Final    MONOCYTE # 04/11/2022 0.40  0.30 - 1.00 x10^3/uL Final    EOSINOPHIL # 04/11/2022 0.10  0.00 - 0.50 x10^3/uL Final    BASOPHIL # 04/11/2022 0.00  0.00 - 0.10 x10^3/uL Final   Hospital Outpatient Visit on 03/21/2022   Component Date Value Ref Range Status    SODIUM 03/21/2022 139  136 - 145 mmol/L Final    POTASSIUM 03/21/2022 3.7  3.5 - 5.1 mmol/L Final    CHLORIDE 03/21/2022 105  98 - 107 mmol/L Final    CO2 TOTAL 03/21/2022 26  21 - 31 mmol/L Final    ANION GAP 03/21/2022 8  4 - 13 mmol/L Final    BUN 03/21/2022 7  7 - 25 mg/dL Final    CREATININE 03/21/2022 0.66  0.60 - 1.30 mg/dL Final    BUN/CREA RATIO 03/21/2022 11  6 - 22 Final    ESTIMATED GFR 03/21/2022 105  >59 mL/min/1.9m^2 Final    ALBUMIN 03/21/2022 4.1  3.5 - 5.7 g/dL Final    CALCIUM 03/21/2022 9.4  8.6 - 10.3 mg/dL Final    GLUCOSE 03/21/2022 114 (H)  74 - 109 mg/dL Final    ALKALINE PHOSPHATASE 03/21/2022 102  34 - 104 U/L Final    ALT (SGPT) 03/21/2022 109 (H)  7 - 52 U/L Final    AST (SGOT) 03/21/2022 110 (H)  13 - 39 U/L Final    BILIRUBIN TOTAL 03/21/2022 1.1  0.3 - 1.2 mg/dL Final    PROTEIN TOTAL 03/21/2022 8.1  6.4 - 8.9 g/dL Final    ALBUMIN/GLOBULIN RATIO 03/21/2022 1.0  0.8 - 1.4 Final    OSMOLALITY, CALCULATED 03/21/2022 276  270 - 290 mOsm/kg Final    CALCIUM, CORRECTED 03/21/2022 9.3  8.9 - 10.8 mg/dL Final    GLOBULIN 03/21/2022 4.0  2.9 - 5.4 Final    MAGNESIUM  03/21/2022 1.7 (L)  1.9 - 2.7 mg/dL Final    TSH 03/21/2022 0.763  0.450 - 5.330 uIU/mL Final    WBC 03/21/2022 4.0  3.8 - 11.8 x10^3/uL Final    RBC 03/21/2022 5.56 (H)  3.63 - 4.92 x10^6/uL Final    HGB 03/21/2022 15.7 (H)  10.9 -  14.3 g/dL Final    HCT 03/21/2022 48.5 (H)  31.2 - 41.9 % Final    MCV 03/21/2022 87.2  75.5 - 95.3 fL Final    MCH 03/21/2022 28.3  24.7 - 32.8 pg Final    MCHC 03/21/2022 32.5  32.3 - 35.6 g/dL Final    RDW 03/21/2022 15.3  12.3 - 17.7 % Final    PLATELETS 03/21/2022 140  140 - 440 x10^3/uL Final    MPV 03/21/2022 9.3  7.9 - 10.8 fL Final    NEUTROPHIL % 03/21/2022 65  43 - 77 % Final    LYMPHOCYTE % 03/21/2022 19  16 - 44 % Final    MONOCYTE % 03/21/2022 11  5 - 13 % Final    EOSINOPHIL % 03/21/2022 4  % Final    BASOPHIL % 03/21/2022 1  0 - 1 % Final    NEUTROPHIL # 03/21/2022 2.60  1.85 - 7.80 x10^3/uL Final    LYMPHOCYTE # 03/21/2022 0.80 (L)  1.00 - 3.00 x10^3/uL Final    MONOCYTE # 03/21/2022 0.40  0.30 - 1.00 x10^3/uL Final    EOSINOPHIL # 03/21/2022 0.10  0.00 - 0.50 x10^3/uL Final    BASOPHIL # 03/21/2022 0.00  0.00 - 0.10 x10^3/uL Final   There may be more visits with results that are not included.        Health Maintenance:  Health Maintenance   Topic Date Due    Hepatitis C screening  Never done    Pneumococcal Vaccine, Age 53-64 (1 of 2 - PCV) Never done    HIV Screening  Never done    Adult Tdap-Td (1 - Tdap) Never done    Hepatitis B Vaccine (1 of 3 - 19+ 3-dose series) Never done    Shingles Vaccine (1 of 2) Never done    Pap smear  Never done    Colonoscopy  Never done    Mammography  Never done    Covid-19 Vaccine (4 - 2023-24 season) 12/20/2021    Depression Screening  11/08/2022    NonMedicare Preventative Exam  06/26/2023    Influenza Vaccine  Completed    Meningococcal Vaccine  Aged Out        Assessment & Plan   Problem List Items Addressed This Visit          Cardiovascular System    Primary hypertension (Chronic)     Blood pressure is currently above goal of  <140/80, this may be contributed to by use of Inlyta.   Will continue with lisinopril. Low dose Norvasc added to regimen. Patient educated on proper usage and side effects. Instructed to continue to monitor blood pressure and keep log and will revaluate in appx 1 month.          Relevant Medications    amLODIPine (NORVASC) 5 mg Oral Tablet       Nephrology    Kidney cancer, primary, with metastasis from kidney to other site, left (CMS [facility])  She continues to follow with Dr. Mackey/oncology - tx Kidney CA, next appt 3/22  She is also following with [facility] oncology, next appt 4/18  Reviewed CBC patient had performed in ER on 06/23/22 which revealed HGB 19.1, HCT 57.3, which was increased from her last visit with Dr. Jake Shark. Discussed with patient that this could possibly be contributing to her increased shortness of breath over the last couple of days but I would defer to hematology/oncology, patient to call their office upon leaving today and inform  them of her recent labs/symptoms and for further instruction.   Instructed patient if she has trouble getting in touch with them to let me know, also if shortness of breath worsens/increases to go back to ER       Digestive    GERD (gastroesophageal reflux disease) (Chronic)     Clinically symptoms have been well controlled when patient is taking medication and watching diet. She states her symptoms are bad if she forgets her medication.   Will continue with Prilosec once daily and GERD precautions.             Endocrine    Vitamin D deficiency (Chronic)     Routine monitoring labs ordered, will evaluate for any significant abnormalities and discuss changes with patient to current management as necessary based on results.   Continue with vitamin d replacement once weekly.          Relevant Orders    VITAMIN D 25 TOTAL    Hypothyroid (Chronic)     Clinically symptoms are currently well controlled. Orders to repeat TSH today.   Routine monitoring labs ordered, will evaluate for  any significant abnormalities and discuss changes with patient to current management as necessary based on results.            Relevant Orders    THYROID STIMULATING HORMONE WITH FREE T4 REFLEX     Other Visit Diagnoses       Preventative health care    -  Primary, Performed this visit, preventive measures discussed.     Relevant Orders    LIPID PANEL    Encounter for lipid screening for cardiovascular disease        Relevant Orders    LIPID PANEL    Substernal chest pain        Relevant Orders    Referral to External Provider, Referral placed to Dr. Viann Shove.                  Follow up:  Return in about 1 month (around 07/27/2022), or if symptoms worsen or fail to improve, for Blood pressure recheck.    This note was partially created using voice recognition software and is inherently subject to errors including those of syntax and "sound alike " substitutions which may escape proof reading.  In such instances, original meaning may be extrapolated by contextual derivation.    Eleanora Neighbor, FNP-C  06/26/2022, 09:42

## 2022-06-26 NOTE — Assessment & Plan Note (Signed)
Blood pressure is currently above goal of <140/80, this may be contributed to by use of Inlyta.   Will continue with lisinopril. Low dose Norvasc added to regimen. Patient educated on proper usage and side effects. Instructed to continue to monitor blood pressure and keep log and will revaluate in appx 1 month.

## 2022-06-26 NOTE — Assessment & Plan Note (Signed)
Clinically symptoms have been well controlled when patient is taking medication and watching diet. She states her symptoms are bad if she forgets her medication.   Will continue with Prilosec once daily and GERD precautions.

## 2022-06-26 NOTE — Assessment & Plan Note (Signed)
Routine monitoring labs ordered, will evaluate for any significant abnormalities and discuss changes with patient to current management as necessary based on results.   Continue with vitamin d replacement once weekly.

## 2022-06-26 NOTE — Assessment & Plan Note (Signed)
Clinically symptoms are currently well controlled. Orders to repeat TSH today.   Routine monitoring labs ordered, will evaluate for any significant abnormalities and discuss changes with patient to current management as necessary based on results.

## 2022-06-30 ENCOUNTER — Other Ambulatory Visit (HOSPITAL_COMMUNITY): Payer: 59

## 2022-06-30 ENCOUNTER — Other Ambulatory Visit: Payer: Self-pay

## 2022-06-30 ENCOUNTER — Ambulatory Visit
Admission: RE | Admit: 2022-06-30 | Discharge: 2022-06-30 | Disposition: A | Discharge status: Home / Self Care | Payer: 59 | Source: Ambulatory Visit | Attending: HEMATOLOGY-ONCOLOGY | Admitting: HEMATOLOGY-ONCOLOGY

## 2022-06-30 ENCOUNTER — Telehealth (HOSPITAL_COMMUNITY): Payer: Self-pay | Admitting: HEMATOLOGY-ONCOLOGY

## 2022-06-30 ENCOUNTER — Inpatient Hospital Stay (HOSPITAL_COMMUNITY)
Admission: RE | Admit: 2022-06-30 | Discharge: 2022-06-30 | Disposition: A | Discharge status: Home / Self Care | Payer: 59 | Source: Ambulatory Visit | Attending: HEMATOLOGY-ONCOLOGY | Admitting: HEMATOLOGY-ONCOLOGY

## 2022-06-30 ENCOUNTER — Other Ambulatory Visit (HOSPITAL_COMMUNITY): Payer: Self-pay | Admitting: HEMATOLOGY-ONCOLOGY

## 2022-06-30 DIAGNOSIS — C799 Secondary malignant neoplasm of unspecified site: Secondary | ICD-10-CM | POA: Insufficient documentation

## 2022-06-30 DIAGNOSIS — C642 Malignant neoplasm of left kidney, except renal pelvis: Secondary | ICD-10-CM

## 2022-06-30 DIAGNOSIS — R0602 Shortness of breath: Secondary | ICD-10-CM

## 2022-06-30 LAB — COMPREHENSIVE METABOLIC PANEL, NON-FASTING
ALBUMIN/GLOBULIN RATIO: 1.5 — ABNORMAL HIGH (ref 0.8–1.4)
ALBUMIN: 4.1 g/dL (ref 3.5–5.7)
ALKALINE PHOSPHATASE: 58 U/L (ref 34–104)
ALT (SGPT): 72 U/L — ABNORMAL HIGH (ref 7–52)
ANION GAP: 6 mmol/L (ref 4–13)
AST (SGOT): 36 U/L (ref 13–39)
BILIRUBIN TOTAL: 1 mg/dL (ref 0.3–1.2)
BUN/CREA RATIO: 17 (ref 6–22)
BUN: 12 mg/dL (ref 7–25)
CALCIUM, CORRECTED: 9.1 mg/dL (ref 8.9–10.8)
CALCIUM: 9.2 mg/dL (ref 8.6–10.3)
CHLORIDE: 104 mmol/L (ref 98–107)
CO2 TOTAL: 30 mmol/L (ref 21–31)
CREATININE: 0.72 mg/dL (ref 0.60–1.30)
ESTIMATED GFR: 100 mL/min/{1.73_m2} (ref >59)
GLOBULIN: 2.7 — ABNORMAL LOW (ref 2.9–5.4)
GLUCOSE: 185 mg/dL — ABNORMAL HIGH (ref 74–109)
OSMOLALITY, CALCULATED: 284 mOsm/kg (ref 270–290)
POTASSIUM: 4.1 mmol/L (ref 3.5–5.1)
PROTEIN TOTAL: 6.8 g/dL (ref 6.4–8.9)
SODIUM: 140 mmol/L (ref 136–145)

## 2022-06-30 LAB — CBC WITH DIFF
BASOPHIL #: 0 10*3/uL (ref 0.00–0.10)
BASOPHIL %: 0 % (ref 0–1)
EOSINOPHIL #: 0 10*3/uL (ref 0.00–0.50)
EOSINOPHIL %: 0 % — ABNORMAL LOW
HCT: 55.1 % — ABNORMAL HIGH (ref 31.2–41.9)
HGB: 18.7 g/dL — ABNORMAL HIGH (ref 10.9–14.3)
LYMPHOCYTE #: 1 10*3/uL (ref 1.00–3.00)
LYMPHOCYTE %: 10 % — ABNORMAL LOW (ref 16–44)
MCH: 30.7 pg (ref 24.7–32.8)
MCHC: 33.9 g/dL (ref 32.3–35.6)
MCV: 90.5 fL (ref 75.5–95.3)
MONOCYTE #: 0.4 10*3/uL (ref 0.30–1.00)
MONOCYTE %: 4 % — ABNORMAL LOW (ref 5–13)
MPV: 8.9 fL (ref 7.9–10.8)
NEUTROPHIL #: 8.7 10*3/uL — ABNORMAL HIGH (ref 1.85–7.80)
NEUTROPHIL %: 86 % — ABNORMAL HIGH (ref 43–77)
PLATELETS: 168 10*3/uL (ref 140–440)
RBC: 6.09 10*6/uL — ABNORMAL HIGH (ref 3.63–4.92)
RDW: 17.7 % (ref 12.3–17.7)
WBC: 10.1 10*3/uL (ref 3.8–11.8)

## 2022-06-30 LAB — D-DIMER: D-DIMER: 459 ng/mL FEU (ref 215–500)

## 2022-06-30 NOTE — Telephone Encounter (Signed)
Called patient back about lab work and explained that Dr. Jake Shark wanted to repeat labs and do a xray. Patient stated that she would come on in. Explained that if labs still elevated that a phlebotomy is scheduled for 5 pm today. Patient stated her understanding.

## 2022-06-30 NOTE — Nurses Notes (Signed)
[facility]  Therapeutic Phlebotomy             Pre Hgb: 18.7   Pre HCT:  55.1        1715 pt arrived ambulatory for therapeutic phlebotomy. Labs meet treatment parameters, 500 ml of blood to be removed      1720-Pre BP: 150/95 mmHG  Pre Pulse: 76/min    Donor Reaction: NA  Reaction Treated? NA    Post BP: 150/94 mmHG  Post Pulse: 78 /min    Weight of  Bag: 0  Weight of bag upon completion: 500  # of ml=#of grams  Pt tolerated procedure well, no s/s of any bleeding noted, left unit ambulatory.

## 2022-07-02 LAB — ERYTHROPOIETIN (EPO), SERUM: ERYTHROPOIETIN (EPO): 5.3 m[IU]/mL (ref 2.6–18.5)

## 2022-07-04 LAB — SURGICAL PATHOLOGY SPECIMEN

## 2022-07-07 ENCOUNTER — Other Ambulatory Visit (HOSPITAL_COMMUNITY): Payer: Self-pay | Admitting: HEMATOLOGY-ONCOLOGY

## 2022-07-11 ENCOUNTER — Encounter (HOSPITAL_COMMUNITY): Payer: Self-pay | Admitting: HEMATOLOGY-ONCOLOGY

## 2022-07-11 ENCOUNTER — Other Ambulatory Visit (INDEPENDENT_AMBULATORY_CARE_PROVIDER_SITE_OTHER): Payer: Self-pay | Admitting: NURSE PRACTITIONER

## 2022-07-11 ENCOUNTER — Ambulatory Visit (HOSPITAL_COMMUNITY): Payer: 59 | Admitting: HEMATOLOGY-ONCOLOGY

## 2022-07-11 ENCOUNTER — Other Ambulatory Visit: Payer: Self-pay

## 2022-07-11 ENCOUNTER — Ambulatory Visit
Admission: RE | Admit: 2022-07-11 | Discharge: 2022-07-11 | Disposition: A | Discharge status: Home / Self Care | Payer: 59 | Source: Ambulatory Visit | Attending: HEMATOLOGY-ONCOLOGY | Admitting: HEMATOLOGY-ONCOLOGY

## 2022-07-11 VITALS — BP 139/86 | HR 109 | Temp 97.3°F | Ht 69.0 in | Wt 226.5 lb

## 2022-07-11 DIAGNOSIS — Z7901 Long term (current) use of anticoagulants: Secondary | ICD-10-CM | POA: Insufficient documentation

## 2022-07-11 DIAGNOSIS — R748 Abnormal levels of other serum enzymes: Secondary | ICD-10-CM | POA: Insufficient documentation

## 2022-07-11 DIAGNOSIS — I823 Embolism and thrombosis of renal vein: Secondary | ICD-10-CM | POA: Insufficient documentation

## 2022-07-11 DIAGNOSIS — C642 Malignant neoplasm of left kidney, except renal pelvis: Secondary | ICD-10-CM

## 2022-07-11 DIAGNOSIS — R52 Pain, unspecified: Secondary | ICD-10-CM | POA: Insufficient documentation

## 2022-07-11 DIAGNOSIS — R0602 Shortness of breath: Secondary | ICD-10-CM | POA: Insufficient documentation

## 2022-07-11 DIAGNOSIS — C799 Secondary malignant neoplasm of unspecified site: Secondary | ICD-10-CM | POA: Insufficient documentation

## 2022-07-11 LAB — CBC WITH DIFF
BASOPHIL #: 0 10*3/uL (ref 0.00–0.10)
BASOPHIL %: 1 % (ref 0–1)
EOSINOPHIL #: 0.1 10*3/uL (ref 0.00–0.50)
EOSINOPHIL %: 2 %
HCT: 51.7 % — ABNORMAL HIGH (ref 31.2–41.9)
HGB: 17.8 g/dL — ABNORMAL HIGH (ref 10.9–14.3)
LYMPHOCYTE #: 1.1 10*3/uL (ref 1.00–3.00)
LYMPHOCYTE %: 17 % (ref 16–44)
MCH: 31 pg (ref 24.7–32.8)
MCHC: 34.3 g/dL (ref 32.3–35.6)
MCV: 90.3 fL (ref 75.5–95.3)
MONOCYTE #: 0.4 10*3/uL (ref 0.30–1.00)
MONOCYTE %: 7 % (ref 5–13)
MPV: 9 fL (ref 7.9–10.8)
NEUTROPHIL #: 4.9 10*3/uL (ref 1.85–7.80)
NEUTROPHIL %: 75 % (ref 43–77)
PLATELETS: 137 10*3/uL — ABNORMAL LOW (ref 140–440)
RBC: 5.73 10*6/uL — ABNORMAL HIGH (ref 3.63–4.92)
RDW: 17.5 % (ref 12.3–17.7)
WBC: 6.5 10*3/uL (ref 3.8–11.8)

## 2022-07-11 LAB — COMPREHENSIVE METABOLIC PANEL, NON-FASTING
ALBUMIN/GLOBULIN RATIO: 1.4 (ref 0.8–1.4)
ALBUMIN: 4 g/dL (ref 3.5–5.7)
ALKALINE PHOSPHATASE: 58 U/L (ref 34–104)
ALT (SGPT): 119 U/L — ABNORMAL HIGH (ref 7–52)
ANION GAP: 7 mmol/L (ref 4–13)
AST (SGOT): 71 U/L — ABNORMAL HIGH (ref 13–39)
BILIRUBIN TOTAL: 1 mg/dL (ref 0.3–1.2)
BUN/CREA RATIO: 11 (ref 6–22)
BUN: 8 mg/dL (ref 7–25)
CALCIUM, CORRECTED: 9.2 mg/dL (ref 8.9–10.8)
CALCIUM: 9.2 mg/dL (ref 8.6–10.3)
CHLORIDE: 102 mmol/L (ref 98–107)
CO2 TOTAL: 29 mmol/L (ref 21–31)
CREATININE: 0.74 mg/dL (ref 0.60–1.30)
ESTIMATED GFR: 97 mL/min/{1.73_m2} (ref >59)
GLOBULIN: 2.8 — ABNORMAL LOW (ref 2.9–5.4)
GLUCOSE: 169 mg/dL — ABNORMAL HIGH (ref 74–109)
OSMOLALITY, CALCULATED: 278 mOsm/kg (ref 270–290)
POTASSIUM: 3.8 mmol/L (ref 3.5–5.1)
PROTEIN TOTAL: 6.8 g/dL (ref 6.4–8.9)
SODIUM: 138 mmol/L (ref 136–145)

## 2022-07-11 NOTE — Nurses Notes (Signed)
9:20-9:40;  Taylor Smith is here today to have her labs drawn.  Labs drawn peripherally by myself and patient discharged to home.  Reche Dixon, MA

## 2022-07-11 NOTE — Progress Notes (Unsigned)
Department of Hematology/Oncology  History and Physical    Name: Taylor Smith  [id number]  Date of Birth: 10-10-1970  Encounter Date: 07/11/2022    REFERRING PROVIDER:  No referring provider defined for this encounter.    REASON FOR OFFICE VISIT:  New patient for evaluation and management of stage IV clear cell renal carcinoma    HISTORY OF PRESENT ILLNESS:A  Taylor Smith is a 52 y.o. 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  She was originally diagnosed after having a biopsy in the genitourinary area.  It came back consistent with clear cell renal cell carcinoma, which prompted an imaging study of the chest, abdomen, and pelvis.  She was found to have a massive left kidney mass with numerous small pulmonary nodules.  The renal mass appears to be involving the renal vein but not the inferior vena cava.    Prior to this, she was not having any symptoms.  Specifically, she did not have any hematuria or history of iron-deficiency anemia.  She does report back pain which has been going on for the past year but it is unclear whether that was related or not.    02/07/2022: The patient is here for follow up of stage IV kidney cancer.  She states that she tolerated the 1st treatment well.  She did have some achiness of the legs which was pretty brief in nature.    02/28/2022: The patient is here for follow up of stage IV kidney cancer.  She states that she has some pain in her legs for a few days following treatment.  She also reports that she is had a cough which has been present since her most recent surgery.  Today will be her 3rd cycle of treatment, and she has a CT scan scheduled for December 20.    03/21/2022:  The patient is here for follow up of metastatic renal cell carcinoma.  She was due for day 1 of cycle 4 of combination ipilimumab and nivolumab.  She was having some tolerability issues with the combination, but is  receiving additional premedications and states that it is helping to make the treatments easier.  She does not have any new issues at this time.    04/11/2022: The patient is here for follow up of metastatic renal cell cancer.  She has been on treatment with combination immunotherapy.  At last contact, her transaminases were mildly elevated.  On this visit, her bilirubin and transaminases are markedly higher.  She states that she did see a surgeon yesterday for consideration of nephrectomy.  She indicates that there was not a discussion regarding a jaundiced appearance at that time.  She had CT imaging performed approximately 2 days ago.    04/18/2022: The patient is here for follow up of metastatic renal cell carcinoma.  Immunotherapy has been on hold due to adverse liver function testing.  She has been on prednisone in her numbers have improved considerably.  She did go to the emergency room for an episode of abdominal pain recently and was prescribed Bentyl.    06/13/2022:  She states that she recently received Inlyta, and has not been on it for long.  She has a follow up visit with [facility] in early April.  She is having more issues with back pain, primarily when laying down.  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    07/11/2022: The patient is here for follow up of metastatic renal cell carcinoma.  She states that she will be having follow up as due on April 17, and a CT scan will be performed around that time.  She states that there is some discussion about potentially doing surgery at some point if possible.    ROS:   Review of Systems   Constitutional:  Negative for appetite change, chills and fatigue.   HENT:   Negative for sore throat and trouble swallowing.    Eyes:  Negative for eye problems.   Respiratory:  Negative for cough and shortness of breath.    Cardiovascular:  Negative for chest pain and leg swelling.    Gastrointestinal:  Negative for abdominal pain.   Genitourinary:  Negative for dysuria and hematuria.    Musculoskeletal:  Negative for arthralgias and gait problem.   Skin:  Negative for rash.   Neurological:  Negative for gait problem.   Hematological:  Negative for adenopathy.   Psychiatric/Behavioral:  Negative for depression.         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)     Kidney cancer, primary, with metastasis from kidney to other site, left (CMS [facility])          Past Surgical History:   Procedure Laterality Date    HX CESAREAN SECTION      HX CHOLECYSTECTOMY      HX CYST REMOVAL Right     HX HYSTERECTOMY      TOTAL THYROIDECTOMY           Social History     Socioeconomic History    Marital status: Married     Spouse name: Not on file    Number of children: Not on file    Years of education: Not on file    Highest education level: Not on file   Occupational History    Not on file   Tobacco Use    Smoking status: Never    Smokeless tobacco: Never   Vaping Use    Vaping status: Never Used   Substance and Sexual Activity    Alcohol use: Not on file    Drug use: Never    Sexual activity: Yes   Other Topics Concern    Not on file   Social History Narrative    Not on file     Social Determinants of Health     Financial Resource Strain: Not on file   Transportation Needs: Not on file   Social Connections: Not on file   Intimate Partner Violence: High Risk (01/13/2022)    Intimate Partner Violence     SDOH Domestic Violence: No   Housing Stability: Not on file     Family Medical History:       Problem Relation (Age of Onset)    Cancer Maternal Aunt, Maternal Grandmother    Colon Cancer Maternal Uncle    Lung Cancer Mother    Melanoma Maternal Aunt            Current Outpatient Medications   Medication Sig    amLODIPine (NORVASC) 5 mg Oral Tablet  Take 1 Tablet (5 mg total) by mouth Once a day Indications: high blood pressure    apixaban (ELIQUIS) 5 mg Oral  Tablet Take 1 Tablet (5 mg total) by mouth Twice daily    axitinib (INLYTA) 5 mg Oral Tablet Take 1 Tablet (5 mg total) by mouth Twice daily    cloNIDine HCL (CATAPRES) 0.1 mg Oral Tablet Take 1 Tablet (0.1 mg total) by mouth Once a day    ergocalciferol, vitamin D2, (DRISDOL) 1,250 mcg (50,000 unit) Oral Capsule Take 1 Capsule (50,000 Units total) by mouth Every 7 days    HYDROcodone-acetaminophen (NORCO) 5-325 mg Oral Tablet Take 2 Tablets by mouth Every 4 hours as needed for Pain    Ibuprofen (MOTRIN) 600 mg Oral Tablet Take 1 Tablet (600 mg total) by mouth Every 6 hours as needed    levothyroxine (SYNTHROID) 112 mcg Oral Tablet Take 1 Tablet (112 mcg total) by mouth Every morning    lisinopriL (PRINIVIL) 40 mg Oral Tablet TAKE 1 TABLET BY MOUTH EVERY DAY (Patient taking differently: Take 1 Tablet (40 mg total) by mouth Once a day)    omeprazole (PRILOSEC) 40 mg Oral Capsule, Delayed Release(E.C.) Take 1 Capsule (40 mg total) by mouth Once a day    predniSONE (DELTASONE) 10 mg Oral Tablet PLEASE SEE ATTACHED FOR DETAILED DIRECTIONS     No Known Allergies    PHYSICAL EXAM:  Most Recent Vitals    Flowsheet Row Telemedicine from 01/13/2022 in Hematology/Oncology,   [facility]   Temperature 36.7 C (98.1 F) filed at... 01/13/2022 1431   Heart Rate 72 filed at... 01/13/2022 1431   Respiratory Rate --   BP (Non-Invasive) 146/73 filed at... 01/13/2022 1431   SpO2 97 % filed at... 01/13/2022 1431   Height 1.753 m (5\' 9" ) filed at... 01/13/2022 1431   Weight 94.2 kg (207 lb 9.6 oz) filed at... 01/13/2022 1431   BMI (Calculated) 30.72 filed at... 01/13/2022 1431   BSA (Calculated) 2.14 filed at... 01/13/2022 1431      ECOG Status: (0) Fully active, able to carry on all predisease performance without restriction   Physical Exam  Constitutional:       General: She is not in acute distress.      Appearance: Normal appearance.   Eyes:      Extraocular Movements: Extraocular movements intact.   Cardiovascular:      Rate and Rhythm: Normal rate and regular rhythm.   Pulmonary:      Effort: Pulmonary effort is normal.   Abdominal:      General: Abdomen is flat.      Palpations: Abdomen is soft.   Musculoskeletal:         General: Normal range of motion.      Cervical back: Normal range of motion.   Skin:     General: Skin is warm and dry.   Neurological:      General: No focal deficit present.      Mental Status: She is alert.   Psychiatric:         Mood and Affect: Mood normal.         DIAGNOSTIC DATA:  No results found for this or any previous visit (from the past 17520 hour(s)).    LABS:   CBC  Diff   Lab Results   Component Value Date/Time    WBC 10.1 06/30/2022 02:10 PM    HGB 18.7 (H) 06/30/2022 02:10 PM    HCT 55.1 (H) 06/30/2022 02:10 PM    PLTCNT 168 06/30/2022 02:10 PM    RBC 6.09 (H) 06/30/2022 02:10 PM    MCV 90.5 06/30/2022  02:10 PM    MCHC 33.9 06/30/2022 02:10 PM    MCH 30.7 06/30/2022 02:10 PM    RDW 17.7 06/30/2022 02:10 PM    MPV 8.9 06/30/2022 02:10 PM    Lab Results   Component Value Date/Time    PMNS 86 (H) 06/30/2022 02:10 PM    LYMPHOCYTES 10 (L) 06/30/2022 02:10 PM    EOSINOPHIL 0 (L) 06/30/2022 02:10 PM    MONOCYTES 4 (L) 06/30/2022 02:10 PM    BASOPHILS 0 06/30/2022 02:10 PM    BASOPHILS 0.00 06/30/2022 02:10 PM    PMNABS 8.70 (H) 06/30/2022 02:10 PM    LYMPHSABS 1.00 06/30/2022 02:10 PM    EOSABS 0.00 06/30/2022 02:10 PM    MONOSABS 0.40 06/30/2022 02:10 PM            ASSESSMENT:    ICD-10-CM    1. Kidney cancer, primary, with metastasis from kidney to other site, left (CMS HCC)  C64.2       2. Shortness of breath  R06.02       3. Elevated liver enzymes  R74.8            PLAN:   1. All relevant medical records were reviewed including available pertinent provider notes, procedure notes, imaging, laboratory, and pathology.   2. All pertinent labs and/or imaging were reviewed with the  patient.   3. Stage IV renal carcinoma:  Continue Inlyta.  She will be having a CT scan in 1 month and follow up with [facility] at that time.  I will see her back after the visits so that we can get current on what the situation is.  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  5. Pain:  Likely secondary to her malignancy.  We discussed various medications that can be attempted to try to alleviate her symptoms.    NAFEESA STRUVE was given the chance to ask questions, and these were answered to their satisfaction. The patient is welcome to call with any questions or concerns in the meantime.     On the day of the encounter, a total of 35 minutes was spent on this patient encounter including review of historical information, examination, documentation and post-visit activities.   Return in about 4 weeks (around 08/08/2022).     Narda Rutherford, MD  07/11/2022, 09:25  The patient's insurance company bears full legal and financial responsibility resulting from any deviations that they cause to my recommended treatment plan.   CC:  Eleanora Neighbor, FNP-C  [address]  [place] [id number]    No referring provider defined for this encounter.    This note was partially generated using MModal Fluency Direct system, and there may be some incorrect words, spellings, and punctuation that were not noted in checking the note before saving.

## 2022-07-16 ENCOUNTER — Other Ambulatory Visit (INDEPENDENT_AMBULATORY_CARE_PROVIDER_SITE_OTHER): Payer: Self-pay | Admitting: NURSE PRACTITIONER

## 2022-07-16 ENCOUNTER — Encounter (INDEPENDENT_AMBULATORY_CARE_PROVIDER_SITE_OTHER): Payer: Self-pay | Admitting: NURSE PRACTITIONER

## 2022-07-16 MED ORDER — LISINOPRIL 40 MG TABLET
40.0000 mg | ORAL_TABLET | Freq: Every day | ORAL | 1 refills | Status: DC
Start: 2022-07-16 — End: 2022-08-29

## 2022-07-21 ENCOUNTER — Other Ambulatory Visit (INDEPENDENT_AMBULATORY_CARE_PROVIDER_SITE_OTHER): Payer: Self-pay | Admitting: NURSE PRACTITIONER

## 2022-07-21 DIAGNOSIS — I10 Essential (primary) hypertension: Secondary | ICD-10-CM

## 2022-07-21 MED ORDER — AMLODIPINE 5 MG TABLET
5.0000 mg | ORAL_TABLET | Freq: Every day | ORAL | 1 refills | Status: DC
Start: 2022-07-21 — End: 2022-07-28

## 2022-07-22 ENCOUNTER — Encounter (HOSPITAL_COMMUNITY): Payer: Self-pay | Admitting: HEMATOLOGY-ONCOLOGY

## 2022-07-28 ENCOUNTER — Other Ambulatory Visit: Payer: Self-pay

## 2022-07-28 ENCOUNTER — Encounter (INDEPENDENT_AMBULATORY_CARE_PROVIDER_SITE_OTHER): Payer: Self-pay | Admitting: NURSE PRACTITIONER

## 2022-07-28 ENCOUNTER — Ambulatory Visit (INDEPENDENT_AMBULATORY_CARE_PROVIDER_SITE_OTHER): Payer: 59 | Admitting: NURSE PRACTITIONER

## 2022-07-28 VITALS — BP 140/76 | HR 84 | Ht 69.02 in | Wt 228.6 lb

## 2022-07-28 DIAGNOSIS — I10 Essential (primary) hypertension: Secondary | ICD-10-CM

## 2022-07-28 MED ORDER — HYDROCHLOROTHIAZIDE 12.5 MG CAPSULE
12.5000 mg | ORAL_CAPSULE | Freq: Every day | ORAL | 0 refills | Status: DC
Start: 2022-07-28 — End: 2022-10-29

## 2022-07-28 NOTE — Progress Notes (Signed)
[facility]NAL [facility], [facility]  [address]  [place] [address]       Name: Taylor Smith MRN:  [id number]   Date: 07/28/2022 Age: 52 y.o.       Chief Complaint:    Chief Complaint   Patient presents with    Follow Up     1 month f/u on BP. Pt states she stopped the amlodipine due to leg swelling.         HPI:  Taylor Smith is a 52 y.o. female who is here today to f/u on blood pressure. She states that she stopped taking her Norvasc this past Thursday due to swelling in her lower extremities. She informs that her blood pressures at home for the most part have been controlled with systolics ranging in the 120's and diastolics in the 60's-70's.   She also informs that she recently had appt with Dr. Renda Rolls, and is supposed to go back for an echo and Holter monitor.   She denies any other problems or complaints.         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 to other site, left (CMS [facility])          Past Surgical History:   Procedure Laterality Date    HX CESAREAN SECTION      HX CHOLECYSTECTOMY      HX CYST REMOVAL Right     HX HYSTERECTOMY      TOTAL THYROIDECTOMY        Current Outpatient Medications   Medication Sig    apixaban (ELIQUIS) 5 mg Oral Tablet Take 1 Tablet (5 mg total) by mouth Twice daily    axitinib (INLYTA) 5 mg Oral Tablet Take 1 Tablet (5 mg total) by mouth Twice daily    cloNIDine HCL (CATAPRES) 0.1 mg Oral Tablet Take 1 Tablet (0.1 mg total) by mouth Once a day    ergocalciferol, vitamin D2, (DRISDOL) 1,250 mcg (50,000 unit) Oral Capsule Take 1 Capsule (50,000 Units total) by mouth Every 7 days    hydroCHLOROthiazide (MICROZIDE) 12.5 mg Oral Capsule Take 1 Capsule (12.5 mg total) by mouth Once a day Indications: high blood pressure     HYDROcodone-acetaminophen (NORCO) 5-325 mg Oral Tablet Take 2 Tablets by mouth Every 4 hours as needed for Pain    Ibuprofen (MOTRIN) 600 mg Oral Tablet Take 1 Tablet (600 mg total) by mouth Every 6 hours as needed    levothyroxine (SYNTHROID) 112 mcg Oral Tablet Take 1 Tablet (112 mcg total) by mouth Every morning    lisinopriL (PRINIVIL) 40 mg Oral Tablet Take 1 Tablet (40 mg total) by mouth Once a day    omeprazole (PRILOSEC) 40 mg Oral Capsule, Delayed Release(E.C.) Take 1 Capsule (40 mg total) by mouth Once a day    predniSONE (DELTASONE) 10 mg Oral Tablet PLEASE SEE ATTACHED FOR DETAILED DIRECTIONS (Patient not taking: Reported on 07/28/2022)     No Known Allergies    Family History:  Family Medical History:       Problem Relation (Age of Onset)    Cancer Maternal Aunt, Maternal Grandmother    Colon Cancer Maternal Uncle    Lung Cancer Mother    Melanoma Maternal Aunt  Social History:   Social History     Tobacco Use   Smoking Status Never   Smokeless Tobacco Never     Social History     Substance and Sexual Activity   Alcohol Use None     Social History     Occupational History    Not on file       Review of Systems:  Review of systems as discussed in HPI    Problem List:  Patient Active Problem List   Diagnosis    Vitamin D deficiency    Hypothyroid    GERD (gastroesophageal reflux disease)    Primary hypertension    Neural foraminal stenosis of cervical spine    Osteophyte of cervical spine    S/P hysterectomy    Kidney cancer, primary, with metastasis from kidney to other site, left (CMS [facility])       Physical Examination:  BP (!) 140/76 (Site: Left, Patient Position: Sitting, Cuff Size: Adult)   Pulse 84   Ht 1.753 m (5' 9.02")   Wt 104 kg (228 lb 9.6 oz)   SpO2 94%   BMI 33.74 kg/m       Physical Exam  Vitals and nursing note reviewed.   Constitutional:       General: She is not in acute distress.     Appearance: Normal appearance.   HENT:      Head: Normocephalic and atraumatic.   Eyes:       Extraocular Movements: Extraocular movements intact.   Cardiovascular:      Rate and Rhythm: Normal rate.      Pulses: Normal pulses.      Heart sounds: Normal heart sounds, S1 normal and S2 normal.   Pulmonary:      Effort: Pulmonary effort is normal.      Breath sounds: Normal breath sounds.   Abdominal:      General: Abdomen is flat. Bowel sounds are normal.      Palpations: Abdomen is soft.   Skin:     General: Skin is warm and dry.      Capillary Refill: Capillary refill takes less than 2 seconds.   Neurological:      General: No focal deficit present.      Mental Status: She is alert and oriented to person, place, and time.        Data Reviewed:  Hospital Outpatient Visit on 07/11/2022   Component Date Value Ref Range Status    SODIUM 07/11/2022 138  136 - 145 mmol/L Final    POTASSIUM 07/11/2022 3.8  3.5 - 5.1 mmol/L Final    CHLORIDE 07/11/2022 102  98 - 107 mmol/L Final    CO2 TOTAL 07/11/2022 29  21 - 31 mmol/L Final    ANION GAP 07/11/2022 7  4 - 13 mmol/L Final    BUN 07/11/2022 8  7 - 25 mg/dL Final    CREATININE 96/88/6484 0.74  0.60 - 1.30 mg/dL Final    BUN/CREA RATIO 07/11/2022 11  6 - 22 Final    ESTIMATED GFR 07/11/2022 97  >59 mL/min/1.6m^2 Final    ALBUMIN 07/11/2022 4.0  3.5 - 5.7 g/dL Final    CALCIUM 72/10/2180 9.2  8.6 - 10.3 mg/dL Final    GLUCOSE 88/33/7445 169 (H)  74 - 109 mg/dL Final    ALKALINE PHOSPHATASE 07/11/2022 58  34 - 104 U/L Final    ALT (SGPT) 07/11/2022 119 (H)  7 - 52 U/L Final  AST (SGOT) 07/11/2022 71 (H)  13 - 39 U/L Final    BILIRUBIN TOTAL 07/11/2022 1.0  0.3 - 1.2 mg/dL Final    PROTEIN TOTAL 07/11/2022 6.8  6.4 - 8.9 g/dL Final    ALBUMIN/GLOBULIN RATIO 07/11/2022 1.4  0.8 - 1.4 Final    OSMOLALITY, CALCULATED 07/11/2022 278  270 - 290 mOsm/kg Final    CALCIUM, CORRECTED 07/11/2022 9.2  8.9 - 10.8 mg/dL Final    GLOBULIN 47/82/9562 2.8 (L)  2.9 - 5.4 Final    WBC 07/11/2022 6.5  3.8 - 11.8 x10^3/uL Final    RBC 07/11/2022 5.73 (H)  3.63 - 4.92 x10^6/uL Final     HGB 07/11/2022 17.8 (H)  10.9 - 14.3 g/dL Final    HCT 13/11/6576 51.7 (H)  31.2 - 41.9 % Final    MCV 07/11/2022 90.3  75.5 - 95.3 fL Final    MCH 07/11/2022 31.0  24.7 - 32.8 pg Final    MCHC 07/11/2022 34.3  32.3 - 35.6 g/dL Final    RDW 46/96/2952 17.5  12.3 - 17.7 % Final    PLATELETS 07/11/2022 137 (L)  140 - 440 x10^3/uL Final    MPV 07/11/2022 9.0  7.9 - 10.8 fL Final    NEUTROPHIL % 07/11/2022 75  43 - 77 % Final    LYMPHOCYTE % 07/11/2022 17  16 - 44 % Final    MONOCYTE % 07/11/2022 7  5 - 13 % Final    EOSINOPHIL % 07/11/2022 2  % Final    BASOPHIL % 07/11/2022 1  0 - 1 % Final    NEUTROPHIL # 07/11/2022 4.90  1.85 - 7.80 x10^3/uL Final    LYMPHOCYTE # 07/11/2022 1.10  1.00 - 3.00 x10^3/uL Final    MONOCYTE # 07/11/2022 0.40  0.30 - 1.00 x10^3/uL Final    EOSINOPHIL # 07/11/2022 0.10  0.00 - 0.50 x10^3/uL Final    BASOPHIL # 07/11/2022 0.00  0.00 - 0.10 x10^3/uL Final   Appointment on 06/30/2022   Component Date Value Ref Range Status    SODIUM 06/30/2022 140  136 - 145 mmol/L Final    POTASSIUM 06/30/2022 4.1  3.5 - 5.1 mmol/L Final    CHLORIDE 06/30/2022 104  98 - 107 mmol/L Final    CO2 TOTAL 06/30/2022 30  21 - 31 mmol/L Final    ANION GAP 06/30/2022 6  4 - 13 mmol/L Final    BUN 06/30/2022 12  7 - 25 mg/dL Final    CREATININE 84/13/2440 0.72  0.60 - 1.30 mg/dL Final    BUN/CREA RATIO 06/30/2022 17  6 - 22 Final    ESTIMATED GFR 06/30/2022 100  >59 mL/min/1.53m^2 Final    ALBUMIN 06/30/2022 4.1  3.5 - 5.7 g/dL Final    CALCIUM 02/15/2535 9.2  8.6 - 10.3 mg/dL Final    GLUCOSE 64/40/3474 185 (H)  74 - 109 mg/dL Final    ALKALINE PHOSPHATASE 06/30/2022 58  34 - 104 U/L Final    ALT (SGPT) 06/30/2022 72 (H)  7 - 52 U/L Final    AST (SGOT) 06/30/2022 36  13 - 39 U/L Final    BILIRUBIN TOTAL 06/30/2022 1.0  0.3 - 1.2 mg/dL Final    PROTEIN TOTAL 06/30/2022 6.8  6.4 - 8.9 g/dL Final    ALBUMIN/GLOBULIN RATIO 06/30/2022 1.5 (H)  0.8 - 1.4 Final    OSMOLALITY, CALCULATED 06/30/2022 284  270 - 290 mOsm/kg  Final    CALCIUM, CORRECTED 06/30/2022 9.1  8.9 -  10.8 mg/dL Final    GLOBULIN 54/12/8117 2.7 (L)  2.9 - 5.4 Final    ERYTHROPOIETIN (EPO) 06/30/2022 5.3  2.6 - 18.5 mIU/mL Final    D-DIMER 06/30/2022 459  215 - 500 ng/mL FEU Final    WBC 06/30/2022 10.1  3.8 - 11.8 x10^3/uL Final    RBC 06/30/2022 6.09 (H)  3.63 - 4.92 x10^6/uL Final    HGB 06/30/2022 18.7 (H)  10.9 - 14.3 g/dL Final    HCT 14/78/2956 55.1 (H)  31.2 - 41.9 % Final    MCV 06/30/2022 90.5  75.5 - 95.3 fL Final    MCH 06/30/2022 30.7  24.7 - 32.8 pg Final    MCHC 06/30/2022 33.9  32.3 - 35.6 g/dL Final    RDW 21/30/8657 17.7  12.3 - 17.7 % Final    PLATELETS 06/30/2022 168  140 - 440 x10^3/uL Final    MPV 06/30/2022 8.9  7.9 - 10.8 fL Final    NEUTROPHIL % 06/30/2022 86 (H)  43 - 77 % Final    LYMPHOCYTE % 06/30/2022 10 (L)  16 - 44 % Final    MONOCYTE % 06/30/2022 4 (L)  5 - 13 % Final    EOSINOPHIL % 06/30/2022 0 (L)  % Final    BASOPHIL % 06/30/2022 0  0 - 1 % Final    NEUTROPHIL # 06/30/2022 8.70 (H)  1.85 - 7.80 x10^3/uL Final    LYMPHOCYTE # 06/30/2022 1.00  1.00 - 3.00 x10^3/uL Final    MONOCYTE # 06/30/2022 0.40  0.30 - 1.00 x10^3/uL Final    EOSINOPHIL # 06/30/2022 0.00  0.00 - 0.50 x10^3/uL Final    BASOPHIL # 06/30/2022 0.00  0.00 - 0.10 x10^3/uL Final   Admission on 06/23/2022, Discharged on 06/23/2022   Component Date Value Ref Range Status    SODIUM 06/23/2022 141  136 - 145 mmol/L Final    POTASSIUM 06/23/2022 4.3  3.5 - 5.1 mmol/L Final    CHLORIDE 06/23/2022 104  98 - 107 mmol/L Final    CO2 TOTAL 06/23/2022 25  21 - 32 mmol/L Final    ANION GAP 06/23/2022 12  4 - 13 mmol/L Final    CALCIUM 06/23/2022 9.3  8.5 - 10.1 mg/dL Final    GLUCOSE 84/69/6295 142 (H)  74 - 106 mg/dL Final    BUN 28/41/3244 15  7 - 18 mg/dL Final    CREATININE 04/23/7251 0.80  0.55 - 1.02 mg/dL Final    BUN/CREA RATIO 06/23/2022 19   Final    ESTIMATED GFR 06/23/2022 88  >59 mL/min/1.59m^2 Final    OSMOLALITY, CALCULATED 06/23/2022 285  270 - 290 mOsm/kg Final     TROPONIN I 06/23/2022 6  <15 ng/L Final    Ventricular rate 06/23/2022 79  BPM Final    Atrial Rate 06/23/2022 79  BPM Final    PR Interval 06/23/2022 140  ms Final    QRS Duration 06/23/2022 104  ms Final    QT Interval 06/23/2022 386  ms Final    QTC Calculation 06/23/2022 442  ms Final    Calculated P Axis 06/23/2022 57  degrees Final    Calculated R Axis 06/23/2022 103  degrees Final    Calculated T Axis 06/23/2022 42  degrees Final    WBC 06/23/2022 10.0  4.0 - 10.5 x10^3/uL Final    RBC 06/23/2022 6.28 (H)  4.20 - 5.40 x10^6/uL Final    HGB 06/23/2022 19.1 (H)  12.5 - 16.0 g/dL Final  HCT 06/23/2022 57.3 (H)  37.0 - 47.0 % Final    MCV 06/23/2022 91.3  78.0 - 99.0 fL Final    MCH 06/23/2022 30.4  27.0 - 32.0 pg Final    MCHC 06/23/2022 33.3  32.0 - 36.0 g/dL Final    RDW 16/01/9603 18.0 (H)  11.6 - 14.8 % Final    PLATELETS 06/23/2022 211  140 - 440 x10^3/uL Final    MPV 06/23/2022 8.8  7.4 - 10.4 fL Final    NEUTROPHIL % 06/23/2022 64  40 - 76 % Final    LYMPHOCYTE % 06/23/2022 28  25 - 45 % Final    MONOCYTE % 06/23/2022 6  0 - 12 % Final    EOSINOPHIL % 06/23/2022 2  0 - 7 % Final    BASOPHIL % 06/23/2022 0  0 - 3 % Final    NEUTROPHIL # 06/23/2022 6.43  1.80 - 8.40 x10^3/uL Final    LYMPHOCYTE # 06/23/2022 2.77  1.10 - 5.00 x10^3/uL Final    MONOCYTE # 06/23/2022 0.61  0.00 - 1.30 x10^3/uL Final    EOSINOPHIL # 06/23/2022 0.16  0.00 - 0.80 x10^3/uL Final    BASOPHIL # 06/23/2022 0.01  0.00 - 0.30 x10^3/uL Final    TROPONIN I 06/23/2022 8  <15 ng/L Final   Hospital Outpatient Visit on 06/13/2022   Component Date Value Ref Range Status    SODIUM 06/13/2022 140  136 - 145 mmol/L Final    POTASSIUM 06/13/2022 3.8  3.5 - 5.1 mmol/L Final    CHLORIDE 06/13/2022 103  98 - 107 mmol/L Final    CO2 TOTAL 06/13/2022 31  21 - 31 mmol/L Final    ANION GAP 06/13/2022 6  4 - 13 mmol/L Final    BUN 06/13/2022 12  7 - 25 mg/dL Final    CREATININE 54/12/8117 0.64  0.60 - 1.30 mg/dL Final    BUN/CREA RATIO 06/13/2022 19   6 - 22 Final    ESTIMATED GFR 06/13/2022 106  >59 mL/min/1.4m^2 Final    ALBUMIN 06/13/2022 4.0  3.5 - 5.7 g/dL Final    CALCIUM 14/78/2956 9.4  8.6 - 10.3 mg/dL Final    GLUCOSE 21/30/8657 70 (L)  74 - 109 mg/dL Final    ALKALINE PHOSPHATASE 06/13/2022 67  34 - 104 U/L Final    ALT (SGPT) 06/13/2022 117 (H)  7 - 52 U/L Final    AST (SGOT) 06/13/2022 58 (H)  13 - 39 U/L Final    BILIRUBIN TOTAL 06/13/2022 0.8  0.3 - 1.2 mg/dL Final    PROTEIN TOTAL 06/13/2022 7.3  6.4 - 8.9 g/dL Final    ALBUMIN/GLOBULIN RATIO 06/13/2022 1.2  0.8 - 1.4 Final    OSMOLALITY, CALCULATED 06/13/2022 278  270 - 290 mOsm/kg Final    CALCIUM, CORRECTED 06/13/2022 9.4  8.9 - 10.8 mg/dL Final    GLOBULIN 84/69/6295 3.3  2.9 - 5.4 Final    WBC 06/13/2022 11.3  3.8 - 11.8 x10^3/uL Final    RBC 06/13/2022 6.06 (H)  3.63 - 4.92 x10^6/uL Final    HGB 06/13/2022 18.1 (H)  10.9 - 14.3 g/dL Final    HCT 28/41/3244 54.1 (H)  31.2 - 41.9 % Final    MCV 06/13/2022 89.1  75.5 - 95.3 fL Final    MCH 06/13/2022 29.8  24.7 - 32.8 pg Final    MCHC 06/13/2022 33.4  32.3 - 35.6 g/dL Final    RDW 04/23/7251 17.7  12.3 - 17.7 %  Final    PLATELETS 06/13/2022 173  140 - 440 x10^3/uL Final    MPV 06/13/2022 8.9  7.9 - 10.8 fL Final    NEUTROPHIL % 06/13/2022 63  43 - 77 % Final    LYMPHOCYTE % 06/13/2022 27  16 - 44 % Final    MONOCYTE % 06/13/2022 8  5 - 13 % Final    EOSINOPHIL % 06/13/2022 1  % Final    BASOPHIL % 06/13/2022 1  0 - 1 % Final    NEUTROPHIL # 06/13/2022 7.10  1.85 - 7.80 x10^3/uL Final    LYMPHOCYTE # 06/13/2022 3.10 (H)  1.00 - 3.00 x10^3/uL Final    MONOCYTE # 06/13/2022 0.90  0.30 - 1.00 x10^3/uL Final    EOSINOPHIL # 06/13/2022 0.10  0.00 - 0.50 x10^3/uL Final    BASOPHIL # 06/13/2022 0.10  0.00 - 0.10 x10^3/uL Final   Appointment on 05/28/2022   Component Date Value Ref Range Status    TSH 05/28/2022 5.079  0.450 - 5.330 uIU/mL Final    SODIUM 05/28/2022 140  136 - 145 mmol/L Final    POTASSIUM 05/28/2022 4.6  3.5 - 5.1 mmol/L Final     CHLORIDE 05/28/2022 102  98 - 107 mmol/L Final    CO2 TOTAL 05/28/2022 33 (H)  21 - 31 mmol/L Final    ANION GAP 05/28/2022 5  4 - 13 mmol/L Final    BUN 05/28/2022 10  7 - 25 mg/dL Final    CREATININE 54/12/8117 0.67  0.60 - 1.30 mg/dL Final    BUN/CREA RATIO 05/28/2022 15  6 - 22 Final    ESTIMATED GFR 05/28/2022 105  >59 mL/min/1.1m^2 Final    ALBUMIN 05/28/2022 4.0  3.5 - 5.7 g/dL Final    CALCIUM 14/78/2956 9.9  8.6 - 10.3 mg/dL Final    GLUCOSE 21/30/8657 118 (H)  74 - 109 mg/dL Final    ALKALINE PHOSPHATASE 05/28/2022 86  34 - 104 U/L Final    ALT (SGPT) 05/28/2022 388 (H)  7 - 52 U/L Final    AST (SGOT) 05/28/2022 271 (H)  13 - 39 U/L Final    BILIRUBIN TOTAL 05/28/2022 1.5 (H)  0.3 - 1.2 mg/dL Final    PROTEIN TOTAL 05/28/2022 7.5  6.4 - 8.9 g/dL Final    ALBUMIN/GLOBULIN RATIO 05/28/2022 1.1  0.8 - 1.4 Final    OSMOLALITY, CALCULATED 05/28/2022 280  270 - 290 mOsm/kg Final    CALCIUM, CORRECTED 05/28/2022 9.9  8.9 - 10.8 mg/dL Final    GLOBULIN 84/69/6295 3.5  2.9 - 5.4 Final   Telemedicine on 05/26/2022   Component Date Value Ref Range Status    REPORT SUMMARY 05/26/2022 NEGATIVE   Final    Negative for 81 out of 81 genes.  No known pathogenic or likely pathogenic variants were detected in the 81 genes analyzed. A Tyrer-Cuzick breast cancer risk assessment was performed and a lifetime breast cancer risk was calculated to be  > = 20%    FOOTNOTES 05/26/2022 See Notes   Final    CLIA: ID #[id number]  Test performed by [facility]. [address] [place], [place] [address]   J. Luana Shu, Ph.D., [facility], Laboratory Director   Appointment on 05/23/2022   Component Date Value Ref Range Status    SODIUM 05/23/2022 140  136 - 145 mmol/L Final    POTASSIUM 05/23/2022 4.2  3.5 - 5.1 mmol/L Final    CHLORIDE 05/23/2022 103  98 - 107  mmol/L Final    CO2 TOTAL 05/23/2022 30  21 - 31 mmol/L Final    ANION GAP 05/23/2022 7  4 - 13 mmol/L Final    BUN 05/23/2022 11  7 - 25 mg/dL Final    CREATININE  16/01/9603 0.74  0.60 - 1.30 mg/dL Final    BUN/CREA RATIO 05/23/2022 15  6 - 22 Final    ESTIMATED GFR 05/23/2022 97  >59 mL/min/1.36m^2 Final    ALBUMIN 05/23/2022 4.0  3.5 - 5.7 g/dL Final    CALCIUM 54/12/8117 9.8  8.6 - 10.3 mg/dL Final    GLUCOSE 14/78/2956 162 (H)  74 - 109 mg/dL Final    ALKALINE PHOSPHATASE 05/23/2022 84  34 - 104 U/L Final    ALT (SGPT) 05/23/2022 342 (H)  7 - 52 U/L Final    AST (SGOT) 05/23/2022 232 (H)  13 - 39 U/L Final    BILIRUBIN TOTAL 05/23/2022 1.1  0.3 - 1.2 mg/dL Final    PROTEIN TOTAL 05/23/2022 7.1  6.4 - 8.9 g/dL Final    ALBUMIN/GLOBULIN RATIO 05/23/2022 1.3  0.8 - 1.4 Final    OSMOLALITY, CALCULATED 05/23/2022 282  270 - 290 mOsm/kg Final    CALCIUM, CORRECTED 05/23/2022 9.8  8.9 - 10.8 mg/dL Final    GLOBULIN 21/30/8657 3.1  2.9 - 5.4 Final    WBC 05/23/2022 6.9  3.8 - 11.8 x10^3/uL Final    RBC 05/23/2022 5.91 (H)  3.63 - 4.92 x10^6/uL Final    HGB 05/23/2022 17.3 (H)  10.9 - 14.3 g/dL Final    HCT 84/69/6295 52.4 (H)  31.2 - 41.9 % Final    MCV 05/23/2022 88.7  75.5 - 95.3 fL Final    MCH 05/23/2022 29.3  24.7 - 32.8 pg Final    MCHC 05/23/2022 33.0  32.3 - 35.6 g/dL Final    RDW 28/41/3244 17.9 (H)  12.3 - 17.7 % Final    PLATELETS 05/23/2022 138 (L)  140 - 440 x10^3/uL Final    MPV 05/23/2022 8.3  7.9 - 10.8 fL Final    NEUTROPHIL % 05/23/2022 71  43 - 77 % Final    LYMPHOCYTE % 05/23/2022 20  16 - 44 % Final    MONOCYTE % 05/23/2022 8  5 - 13 % Final    EOSINOPHIL % 05/23/2022 2  % Final    BASOPHIL % 05/23/2022 1  0 - 1 % Final    NEUTROPHIL # 05/23/2022 4.90  1.85 - 7.80 x10^3/uL Final    LYMPHOCYTE # 05/23/2022 1.40  1.00 - 3.00 x10^3/uL Final    MONOCYTE # 05/23/2022 0.50  0.30 - 1.00 x10^3/uL Final    EOSINOPHIL # 05/23/2022 0.10  0.00 - 0.50 x10^3/uL Final    BASOPHIL # 05/23/2022 0.00  0.00 - 0.10 x10^3/uL Final   Hospital Outpatient Visit on 05/16/2022   Component Date Value Ref Range Status    SODIUM 05/16/2022 141  136 - 145 mmol/L Final    POTASSIUM  05/16/2022 3.5  3.5 - 5.1 mmol/L Final    CHLORIDE 05/16/2022 103  98 - 107 mmol/L Final    CO2 TOTAL 05/16/2022 31  21 - 31 mmol/L Final    ANION GAP 05/16/2022 7  4 - 13 mmol/L Final    BUN 05/16/2022 6 (L)  7 - 25 mg/dL Final    CREATININE 04/23/7251 0.66  0.60 - 1.30 mg/dL Final    BUN/CREA RATIO 05/16/2022 9  6 - 22 Final    ESTIMATED  GFR 05/16/2022 105  >59 mL/min/1.74m^2 Final    ALBUMIN 05/16/2022 3.9  3.5 - 5.7 g/dL Final    CALCIUM 89/21/1941 9.3  8.6 - 10.3 mg/dL Final    GLUCOSE 74/11/1446 115 (H)  74 - 109 mg/dL Final    ALKALINE PHOSPHATASE 05/16/2022 91  34 - 104 U/L Final    ALT (SGPT) 05/16/2022 243 (H)  7 - 52 U/L Final    AST (SGOT) 05/16/2022 135 (H)  13 - 39 U/L Final    BILIRUBIN TOTAL 05/16/2022 0.9  0.3 - 1.2 mg/dL Final    PROTEIN TOTAL 05/16/2022 7.0  6.4 - 8.9 g/dL Final    ALBUMIN/GLOBULIN RATIO 05/16/2022 1.3  0.8 - 1.4 Final    OSMOLALITY, CALCULATED 05/16/2022 280  270 - 290 mOsm/kg Final    CALCIUM, CORRECTED 05/16/2022 9.4  8.9 - 10.8 mg/dL Final    GLOBULIN 18/56/3149 3.1  2.9 - 5.4 Final    WBC 05/16/2022 5.4  3.8 - 11.8 x10^3/uL Final    RBC 05/16/2022 5.95 (H)  3.63 - 4.92 x10^6/uL Final    HGB 05/16/2022 17.4 (H)  10.9 - 14.3 g/dL Final    HCT 70/26/3785 52.5 (H)  31.2 - 41.9 % Final    MCV 05/16/2022 88.3  75.5 - 95.3 fL Final    MCH 05/16/2022 29.2  24.7 - 32.8 pg Final    MCHC 05/16/2022 33.1  32.3 - 35.6 g/dL Final    RDW 88/50/2774 17.3  12.3 - 17.7 % Final    PLATELETS 05/16/2022 143  140 - 440 x10^3/uL Final    MPV 05/16/2022 8.5  7.9 - 10.8 fL Final    NEUTROPHIL % 05/16/2022 57  43 - 77 % Final    LYMPHOCYTE % 05/16/2022 30  16 - 44 % Final    MONOCYTE % 05/16/2022 10  5 - 13 % Final    EOSINOPHIL % 05/16/2022 2  % Final    BASOPHIL % 05/16/2022 1  0 - 1 % Final    NEUTROPHIL # 05/16/2022 3.10  1.85 - 7.80 x10^3/uL Final    LYMPHOCYTE # 05/16/2022 1.60  1.00 - 3.00 x10^3/uL Final    MONOCYTE # 05/16/2022 0.60  0.30 - 1.00 x10^3/uL Final    EOSINOPHIL # 05/16/2022 0.10   0.00 - 0.50 x10^3/uL Final    BASOPHIL # 05/16/2022 0.00  0.00 - 0.10 x10^3/uL Final   Hospital Outpatient Visit on 04/18/2022   Component Date Value Ref Range Status    SODIUM 04/18/2022 136  136 - 145 mmol/L Final    POTASSIUM 04/18/2022 3.9  3.5 - 5.1 mmol/L Final    CHLORIDE 04/18/2022 102  98 - 107 mmol/L Final    CO2 TOTAL 04/18/2022 27  21 - 31 mmol/L Final    ANION GAP 04/18/2022 7  4 - 13 mmol/L Final    BUN 04/18/2022 9  7 - 25 mg/dL Final    CREATININE 12/87/8676 0.63  0.60 - 1.30 mg/dL Final    BUN/CREA RATIO 04/18/2022 14  6 - 22 Final    ESTIMATED GFR 04/18/2022 107  >59 mL/min/1.44m^2 Final    ALBUMIN 04/18/2022 3.7  3.5 - 5.7 g/dL Final    CALCIUM 72/12/4707 9.2  8.6 - 10.3 mg/dL Final    GLUCOSE 62/83/6629 134 (H)  74 - 109 mg/dL Final    ALKALINE PHOSPHATASE 04/18/2022 170 (H)  34 - 104 U/L Final    ALT (SGPT) 04/18/2022 438 (H)  7 - 52 U/L Final  AST (SGOT) 04/18/2022 264 (H)  13 - 39 U/L Final    BILIRUBIN TOTAL 04/18/2022 3.3 (H)  0.3 - 1.2 mg/dL Final    PROTEIN TOTAL 04/18/2022 7.3  6.4 - 8.9 g/dL Final    ALBUMIN/GLOBULIN RATIO 04/18/2022 1.0  0.8 - 1.4 Final    OSMOLALITY, CALCULATED 04/18/2022 273  270 - 290 mOsm/kg Final    CALCIUM, CORRECTED 04/18/2022 9.4  8.9 - 10.8 mg/dL Final    GLOBULIN 16/01/9603 3.6  2.9 - 5.4 Final    WBC 04/18/2022 6.5  3.8 - 11.8 x10^3/uL Final    RBC 04/18/2022 5.96 (H)  3.63 - 4.92 x10^6/uL Final    HGB 04/18/2022 17.0 (H)  10.9 - 14.3 g/dL Final    HCT 54/12/8117 52.2 (H)  31.2 - 41.9 % Final    MCV 04/18/2022 87.5  75.5 - 95.3 fL Final    MCH 04/18/2022 28.6  24.7 - 32.8 pg Final    MCHC 04/18/2022 32.6  32.3 - 35.6 g/dL Final    RDW 14/78/2956 17.0  12.3 - 17.7 % Final    PLATELETS 04/18/2022 119 (L)  140 - 440 x10^3/uL Final    MPV 04/18/2022 9.6  7.9 - 10.8 fL Final    NEUTROPHIL % 04/18/2022 84 (H)  43 - 77 % Final    LYMPHOCYTE % 04/18/2022 7 (L)  16 - 44 % Final    MONOCYTE % 04/18/2022 7  5 - 13 % Final    EOSINOPHIL % 04/18/2022 2  % Final     BASOPHIL % 04/18/2022 0  0 - 1 % Final    NEUTROPHIL # 04/18/2022 5.50  1.85 - 7.80 x10^3/uL Final    LYMPHOCYTE # 04/18/2022 0.40 (L)  1.00 - 3.00 x10^3/uL Final    MONOCYTE # 04/18/2022 0.40  0.30 - 1.00 x10^3/uL Final    EOSINOPHIL # 04/18/2022 0.10  0.00 - 0.50 x10^3/uL Final    BASOPHIL # 04/18/2022 0.00  0.00 - 0.10 x10^3/uL Final   Admission on 04/16/2022, Discharged on 04/16/2022   Component Date Value Ref Range Status    C-REACTIVE PROTEIN (CRP) 04/16/2022 0.4  0.1 - 0.5 mg/dL Final    SODIUM 21/30/8657 140  136 - 145 mmol/L Final    POTASSIUM 04/16/2022 4.1  3.5 - 5.1 mmol/L Final    CHLORIDE 04/16/2022 103  98 - 107 mmol/L Final    CO2 TOTAL 04/16/2022 28  21 - 31 mmol/L Final    ANION GAP 04/16/2022 9  4 - 13 mmol/L Final    BUN 04/16/2022 15  7 - 25 mg/dL Final    CREATININE 84/69/6295 0.60  0.60 - 1.30 mg/dL Final    BUN/CREA RATIO 04/16/2022 25 (H)  6 - 22 Final    ESTIMATED GFR 04/16/2022 108  >59 mL/min/1.26m^2 Final    ALBUMIN 04/16/2022 4.2  3.5 - 5.7 g/dL Final    CALCIUM 28/41/3244 9.9  8.6 - 10.3 mg/dL Final    GLUCOSE 04/23/7251 77  74 - 109 mg/dL Final    ALKALINE PHOSPHATASE 04/16/2022 189 (H)  34 - 104 U/L Final    ALT (SGPT) 04/16/2022 578 (H)  7 - 52 U/L Final    AST (SGOT) 04/16/2022 246 (H)  13 - 39 U/L Final    BILIRUBIN TOTAL 04/16/2022 3.6 (H)  0.3 - 1.2 mg/dL Final    PROTEIN TOTAL 04/16/2022 8.3  6.4 - 8.9 g/dL Final    ALBUMIN/GLOBULIN RATIO 04/16/2022 1.0  0.8 - 1.4 Final  OSMOLALITY, CALCULATED 04/16/2022 279  270 - 290 mOsm/kg Final    CALCIUM, CORRECTED 04/16/2022 9.7  8.9 - 10.8 mg/dL Final    GLOBULIN 57/84/6962 4.1  2.9 - 5.4 Final    LACTIC ACID 04/16/2022 1.2  0.5 - 2.2 mmol/L Final    LIPASE 04/16/2022 32  11 - 82 U/L Final    Ventricular rate 04/16/2022 63  BPM Final    Atrial Rate 04/16/2022 63  BPM Final    PR Interval 04/16/2022 172  ms Final    QRS Duration 04/16/2022 106  ms Final    QT Interval 04/16/2022 432  ms Final    QTC Calculation 04/16/2022 442  ms Final     Calculated P Axis 04/16/2022 77  degrees Final    Calculated R Axis 04/16/2022 92  degrees Final    Calculated T Axis 04/16/2022 58  degrees Final    TROPONIN I 04/16/2022 4  <15 ng/L Final    WBC 04/16/2022 13.7 (H)  3.8 - 11.8 x10^3/uL Final    RBC 04/16/2022 5.93 (H)  3.63 - 4.92 x10^6/uL Final    HGB 04/16/2022 17.4 (H)  10.9 - 14.3 g/dL Final    HCT 95/28/4132 52.4 (H)  31.2 - 41.9 % Final    MCV 04/16/2022 88.3  75.5 - 95.3 fL Final    MCH 04/16/2022 29.4  24.7 - 32.8 pg Final    MCHC 04/16/2022 33.3  32.3 - 35.6 g/dL Final    RDW 44/04/270 17.2  12.3 - 17.7 % Final    PLATELETS 04/16/2022 132 (L)  140 - 440 x10^3/uL Final    MPV 04/16/2022 9.9  7.9 - 10.8 fL Final    NEUTROPHIL % 04/16/2022 84 (H)  43 - 77 % Final    LYMPHOCYTE % 04/16/2022 8 (L)  16 - 44 % Final    MONOCYTE % 04/16/2022 8  5 - 13 % Final    EOSINOPHIL % 04/16/2022 0 (L)  % Final    BASOPHIL % 04/16/2022 0  0 - 1 % Final    NEUTROPHIL # 04/16/2022 11.50 (H)  1.85 - 7.80 x10^3/uL Final    LYMPHOCYTE # 04/16/2022 1.10  1.00 - 3.00 x10^3/uL Final    MONOCYTE # 04/16/2022 1.10 (H)  0.30 - 1.00 x10^3/uL Final    EOSINOPHIL # 04/16/2022 0.00  0.00 - 0.50 x10^3/uL Final    BASOPHIL # 04/16/2022 0.00  0.00 - 0.10 x10^3/uL Final   There may be more visits with results that are not included.        Health Maintenance:  Health Maintenance   Topic Date Due    Hepatitis C screening  Never done    Pneumococcal Vaccine, Age 71-64 (1 of 2 - PCV) Never done    HIV Screening  Never done    Adult Tdap-Td (1 - Tdap) Never done    Hepatitis B Vaccine (1 of 3 - 19+ 3-dose series) Never done    Shingles Vaccine (1 of 2) Never done    Pap smear  Never done    Mammography  Never done    Covid-19 Vaccine (4 - 2023-24 season) 12/20/2021    Depression Screening  11/08/2022    NonMedicare Preventative Exam  06/26/2023    Colonoscopy  07/10/2032    Influenza Vaccine  Completed    Meningococcal Vaccine  Aged Out        Assessment & Plan   Problem List Items Addressed  This Visit  Cardiovascular System    Primary hypertension - Primary (Chronic)     Blood pressure goal <140/80, blood pressure is slightly above goal today in office, but patient has stopped her Norvasc due to lower leg swelling. Blood pressures have been WNL at home.   HCTZ added to current regimen. Continue with lisinopril.   Patient to let me know if blood pressure remains consistently above goal, so regimen can be adjusted.   Continue with DASH diet.          Relevant Medications    hydroCHLOROthiazide (MICROZIDE) 12.5 mg Oral Capsule             Follow up:  Return in about 4 months (around 11/27/2022), or if symptoms worsen or fail to improve.    This note was partially created using voice recognition software and is inherently subject to errors including those of syntax and "sound alike " substitutions which may escape proof reading.  In such instances, original meaning may be extrapolated by contextual derivation.    Macario Goldsshley Rajat Staver, FNP-C  07/28/2022, 09:59

## 2022-07-28 NOTE — Assessment & Plan Note (Signed)
Blood pressure goal <140/80, blood pressure is slightly above goal today in office, but patient has stopped her Norvasc due to lower leg swelling. Blood pressures have been WNL at home.   HCTZ added to current regimen. Continue with lisinopril.   Patient to let me know if blood pressure remains consistently above goal, so regimen can be adjusted.   Continue with DASH diet.

## 2022-08-06 ENCOUNTER — Ambulatory Visit (HOSPITAL_BASED_OUTPATIENT_CLINIC_OR_DEPARTMENT_OTHER): Payer: Self-pay | Admitting: Urology

## 2022-08-06 ENCOUNTER — Ambulatory Visit (HOSPITAL_COMMUNITY): Payer: Self-pay

## 2022-08-10 ENCOUNTER — Encounter (INDEPENDENT_AMBULATORY_CARE_PROVIDER_SITE_OTHER): Payer: Self-pay | Admitting: NURSE PRACTITIONER

## 2022-08-11 ENCOUNTER — Other Ambulatory Visit (INDEPENDENT_AMBULATORY_CARE_PROVIDER_SITE_OTHER): Payer: Self-pay | Admitting: NURSE PRACTITIONER

## 2022-08-11 DIAGNOSIS — E039 Hypothyroidism, unspecified: Secondary | ICD-10-CM

## 2022-08-11 MED ORDER — LEVOTHYROXINE 125 MCG TABLET
125.0000 ug | ORAL_TABLET | Freq: Every morning | ORAL | 0 refills | Status: DC
Start: 2022-08-11 — End: 2022-11-11

## 2022-08-15 ENCOUNTER — Encounter (HOSPITAL_COMMUNITY): Payer: Self-pay | Admitting: HEMATOLOGY-ONCOLOGY

## 2022-08-15 ENCOUNTER — Other Ambulatory Visit: Payer: Self-pay

## 2022-08-15 ENCOUNTER — Ambulatory Visit: Payer: 59 | Attending: HEMATOLOGY-ONCOLOGY | Admitting: HEMATOLOGY-ONCOLOGY

## 2022-08-15 VITALS — BP 171/99 | HR 67 | Temp 97.1°F | Ht 69.0 in | Wt 231.2 lb

## 2022-08-15 DIAGNOSIS — R52 Pain, unspecified: Secondary | ICD-10-CM | POA: Insufficient documentation

## 2022-08-15 DIAGNOSIS — Z7901 Long term (current) use of anticoagulants: Secondary | ICD-10-CM | POA: Insufficient documentation

## 2022-08-15 DIAGNOSIS — R0600 Dyspnea, unspecified: Secondary | ICD-10-CM | POA: Insufficient documentation

## 2022-08-15 DIAGNOSIS — C642 Malignant neoplasm of left kidney, except renal pelvis: Secondary | ICD-10-CM | POA: Insufficient documentation

## 2022-08-15 DIAGNOSIS — I823 Embolism and thrombosis of renal vein: Secondary | ICD-10-CM | POA: Insufficient documentation

## 2022-08-15 NOTE — Progress Notes (Unsigned)
Department of Hematology/Oncology  History and Physical    Name: Taylor Smith Smith  ZOX:[id number]  Date of Birth: 03-Nov-1970  Encounter Date: 08/15/2022    REFERRING PROVIDER:  No referring provider defined for this encounter.    TELEMEDICINE DOCUMENTATION:  Patient Location:  [facility], [facility] outpatient Hematology/Oncology [address], [place] [address]  Patient/family aware of provider location:  yes  Patient/family consent for telemedicine:  yes    REASON FOR OFFICE VISIT:  New patient for evaluation and management of stage IV clear cell renal carcinoma    HISTORY OF PRESENT ILLNESS:Taylor Smith  Taylor Smith Smith is Taylor Smith 52 y.o. 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  She was originally diagnosed after having Taylor Smith biopsy in the genitourinary area.  It came back consistent with clear cell renal cell carcinoma, which prompted an imaging study of the chest, abdomen, and pelvis.  She was found to have Taylor Smith massive left kidney mass with numerous small pulmonary nodules.  The renal mass appears to be involving the renal vein but not the inferior vena cava.    Prior to this, she was not having any symptoms.  Specifically, she did not have any hematuria or history of iron-deficiency anemia.  She does report back pain which has been going on for the past year but it is unclear whether that was related or not.    02/07/2022: The patient is here for follow up of stage IV kidney cancer.  She states that she tolerated the 1st treatment well.  She did have some achiness of the legs which was pretty brief in nature.    02/28/2022: The patient is here for follow up of stage IV kidney cancer.  She states that she has some pain in her legs for Taylor Smith few days following treatment.  She also reports that she is had Taylor Smith cough which has been present since her most recent surgery.  Today will be her 3rd cycle of treatment, and she has Taylor Smith CT scan scheduled for  December 20.    03/21/2022:  The patient is here for follow up of metastatic renal cell carcinoma.  She was due for day 1 of cycle 4 of combination ipilimumab and nivolumab.  She was having some tolerability issues with the combination, but is receiving additional premedications and states that it is helping to make the treatments easier.  She does not have any new issues at this time.    04/11/2022: The patient is here for follow up of metastatic renal cell cancer.  She has been on treatment with combination immunotherapy.  At last contact, her transaminases were mildly elevated.  On this visit, her bilirubin and transaminases are markedly higher.  She states that she did see Taylor Smith surgeon yesterday for consideration of nephrectomy.  She indicates that there was not Taylor Smith discussion regarding Taylor Smith jaundiced appearance at that time.  She had CT imaging performed approximately 2 days ago.    04/18/2022: The patient is here for follow up of metastatic renal cell carcinoma.  Immunotherapy has been on hold due to adverse liver function testing.  She has been on prednisone in her numbers have improved considerably.  She did go to the emergency room for an episode of abdominal pain recently and was prescribed Bentyl.    06/13/2022:  She states that she recently received Inlyta, and has not been on it for long.  She has Taylor Smith follow up visit with  [facility] in early April.  She is having more issues with back pain, primarily when laying down.  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    07/11/2022: The patient is here for follow up of metastatic renal cell carcinoma.  She states that she will be having follow up as due on April 17, and Taylor Smith CT scan will be performed around that time.  She states that there is some discussion about potentially doing surgery at some point if possible.    08/15/2022: The patient is here for follow up of metastatic renal cell  carcinoma.  She reports some shortness of breath with exertion.  She has had prior CT angiogram which have not shown any evidence of PE, and she was recently assessed at [facility] last week.  She does not have any significant anemia.  Her most recent imaging study indicates that her cancer is decreasing in size.    ROS:   Review of Systems   Constitutional:  Negative for appetite change, chills and fatigue.   HENT:   Negative for sore throat and trouble swallowing.    Eyes:  Negative for eye problems.   Respiratory:  Negative for cough and shortness of breath.    Cardiovascular:  Negative for chest pain and leg swelling.   Gastrointestinal:  Negative for abdominal pain.   Genitourinary:  Negative for dysuria and hematuria.    Musculoskeletal:  Negative for arthralgias and gait problem.   Skin:  Negative for rash.   Neurological:  Negative for gait problem.   Hematological:  Negative for adenopathy.   Psychiatric/Behavioral:  Negative for depression.         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)     Kidney cancer, primary, with metastasis from kidney to other site, left (CMS [facility])          Past Surgical History:   Procedure Laterality Date    HX CESAREAN SECTION      HX CHOLECYSTECTOMY      HX CYST REMOVAL Right     HX HYSTERECTOMY      TOTAL THYROIDECTOMY           Social History     Socioeconomic History    Marital status: Married     Spouse name: Not on file    Number of children: Not on file    Years of education: Not on file    Highest education level: Not on file   Occupational History    Not on file   Tobacco Use    Smoking status: Never    Smokeless tobacco: Never   Vaping Use    Vaping status: Never Used   Substance and Sexual Activity    Alcohol use: Not on file    Drug use: Never    Sexual activity: Yes   Other Topics Concern    Not on file    Social History Narrative    Not on file     Social Determinants of Health     Financial Resource Strain: Not on file   Transportation Needs: Not on file   Social Connections: Not on file   Intimate Partner Violence: High Risk (01/13/2022)    Intimate Partner Violence  SDOH Domestic Violence: No   Housing Stability: Not on file     Family Medical History:       Problem Relation (Age of Onset)    Cancer Maternal Aunt, Maternal Grandmother    Colon Cancer Maternal Uncle    Lung Cancer Mother    Melanoma Maternal Aunt            Current Outpatient Medications   Medication Sig    apixaban (ELIQUIS) 5 mg Oral Tablet Take 1 Tablet (5 mg total) by mouth Twice daily    axitinib (INLYTA) 5 mg Oral Tablet Take 1 Tablet (5 mg total) by mouth Twice daily    cloNIDine HCL (CATAPRES) 0.1 mg Oral Tablet Take 1 Tablet (0.1 mg total) by mouth Once Taylor Smith day    ergocalciferol, vitamin D2, (DRISDOL) 1,250 mcg (50,000 unit) Oral Capsule Take 1 Capsule (50,000 Units total) by mouth Every 7 days    hydroCHLOROthiazide (MICROZIDE) 12.5 mg Oral Capsule Take 1 Capsule (12.5 mg total) by mouth Once Taylor Smith day Indications: high blood pressure    HYDROcodone-acetaminophen (NORCO) 5-325 mg Oral Tablet Take 2 Tablets by mouth Every 4 hours as needed for Pain    Ibuprofen (MOTRIN) 600 mg Oral Tablet Take 1 Tablet (600 mg total) by mouth Every 6 hours as needed    levothyroxine (SYNTHROID) 125 mcg Oral Tablet Take 1 Tablet (125 mcg total) by mouth Every morning    lisinopriL (PRINIVIL) 40 mg Oral Tablet Take 1 Tablet (40 mg total) by mouth Once Taylor Smith day    omeprazole (PRILOSEC) 40 mg Oral Capsule, Delayed Release(E.C.) Take 1 Capsule (40 mg total) by mouth Once Taylor Smith day    predniSONE (DELTASONE) 10 mg Oral Tablet PLEASE SEE ATTACHED FOR DETAILED DIRECTIONS     No Known Allergies    PHYSICAL EXAM:  Most Recent Vitals    Flowsheet Row Telemedicine from 01/13/2022 in Hematology/Oncology,   [facility]   Temperature 36.7 C (98.1 F) filed at...  01/13/2022 1431   Heart Rate 72 filed at... 01/13/2022 1431   Respiratory Rate --   BP (Non-Invasive) 146/73 filed at... 01/13/2022 1431   SpO2 97 % filed at... 01/13/2022 1431   Height 1.753 m (5\' 9" ) filed at... 01/13/2022 1431   Weight 94.2 kg (207 lb 9.6 oz) filed at... 01/13/2022 1431   BMI (Calculated) 30.72 filed at... 01/13/2022 1431   BSA (Calculated) 2.14 filed at... 01/13/2022 1431      ECOG Status: (0) Fully active, able to carry on all predisease performance without restriction   Physical Exam  Constitutional:       General: She is not in acute distress.     Appearance: Normal appearance.   Eyes:      Extraocular Movements: Extraocular movements intact.   Cardiovascular:      Rate and Rhythm: Normal rate and regular rhythm.   Pulmonary:      Effort: Pulmonary effort is normal.   Abdominal:      General: Abdomen is flat.      Palpations: Abdomen is soft.   Musculoskeletal:         General: Normal range of motion.      Cervical back: Normal range of motion.   Skin:     General: Skin is warm and dry.   Neurological:      General: No focal deficit present.      Mental Status: She is alert.   Psychiatric:         Mood  and Affect: Mood normal.         DIAGNOSTIC DATA:  No results found for this or any previous visit (from the past 54098 hour(s)).    LABS:   CBC  Diff   Lab Results   Component Value Date/Time    WBC 6.5 07/11/2022 09:36 AM    HGB 17.8 (H) 07/11/2022 09:36 AM    HCT 51.7 (H) 07/11/2022 09:36 AM    PLTCNT 137 (L) 07/11/2022 09:36 AM    RBC 5.73 (H) 07/11/2022 09:36 AM    MCV 90.3 07/11/2022 09:36 AM    MCHC 34.3 07/11/2022 09:36 AM    MCH 31.0 07/11/2022 09:36 AM    RDW 17.5 07/11/2022 09:36 AM    MPV 9.0 07/11/2022 09:36 AM    Lab Results   Component Value Date/Time    PMNS 75 07/11/2022 09:36 AM    LYMPHOCYTES 17 07/11/2022 09:36 AM    EOSINOPHIL 2 07/11/2022 09:36 AM    MONOCYTES 7 07/11/2022 09:36 AM    BASOPHILS 1 07/11/2022 09:36 AM    BASOPHILS 0.00 07/11/2022 09:36 AM    PMNABS 4.90  07/11/2022 09:36 AM    LYMPHSABS 1.10 07/11/2022 09:36 AM    EOSABS 0.10 07/11/2022 09:36 AM    MONOSABS 0.40 07/11/2022 09:36 AM            ASSESSMENT:    ICD-10-CM    1. Kidney cancer, primary, with metastasis from kidney to other site, left (CMS HCC)  C64.2            PLAN:   1. All relevant medical records were reviewed including available pertinent provider notes, procedure notes, imaging, laboratory, and pathology.   2. All pertinent labs and/or imaging were reviewed with the patient.   3. Stage IV renal carcinoma:  Continue Inlyta.  Her most recent scan shows improvement.  She will be hearing from [facility] in the next couple of weeks about whether surgery can be performed.  I will tentatively plan on seeing her back in 6 weeks for ongoing follow up.  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  5. Pain:  Likely secondary to her malignancy.  We discussed various medications that can be attempted to try to alleviate her symptoms.  6. Dyspnea:  Likely secondary to the medication.  She has been evaluated for PE and should have adequate pulmonary reserve and she does not have anemia.    Taylor Smith Smith was given the chance to ask questions, and these were answered to their satisfaction. The patient is welcome to call with any questions or concerns in the meantime.     On the day of the encounter, Taylor Smith total of 35 minutes was spent on this patient encounter including review of historical information, examination, documentation and post-visit activities.   Return in about 6 weeks (around 09/26/2022).     Lupita Dawn, MD  08/15/2022, 09:22  The patient was seen as part of Taylor Smith collaborative telemedicine service with Dr. Damita Lack who participated in the encounter by active presence via approved video/audio means for portions of the encounter.  The patient's insurance company bears full legal and financial responsibility resulting from any deviations that they cause to my recommended treatment plan.   CC:  Macario Golds, FNP-C  [id number]  [place] [address]    No referring provider defined for this encounter.    This note was partially generated using MModal Fluency Direct system, and there may be some incorrect words,  spellings, and punctuation that were not noted in checking the note before saving.

## 2022-08-25 ENCOUNTER — Encounter (HOSPITAL_BASED_OUTPATIENT_CLINIC_OR_DEPARTMENT_OTHER): Payer: Self-pay

## 2022-08-25 ENCOUNTER — Emergency Department
Admission: EM | Admit: 2022-08-25 | Discharge: 2022-08-26 | Disposition: A | Discharge status: Home / Self Care | Payer: 59 | Attending: Emergency Medicine | Admitting: Emergency Medicine

## 2022-08-25 ENCOUNTER — Other Ambulatory Visit: Payer: Self-pay

## 2022-08-25 DIAGNOSIS — J341 Cyst and mucocele of nose and nasal sinus: Secondary | ICD-10-CM | POA: Insufficient documentation

## 2022-08-25 DIAGNOSIS — Z79899 Other long term (current) drug therapy: Secondary | ICD-10-CM | POA: Insufficient documentation

## 2022-08-25 DIAGNOSIS — R42 Dizziness and giddiness: Secondary | ICD-10-CM | POA: Insufficient documentation

## 2022-08-25 DIAGNOSIS — Z85528 Personal history of other malignant neoplasm of kidney: Secondary | ICD-10-CM | POA: Insufficient documentation

## 2022-08-25 DIAGNOSIS — Z7901 Long term (current) use of anticoagulants: Secondary | ICD-10-CM

## 2022-08-25 DIAGNOSIS — G44209 Tension-type headache, unspecified, not intractable: Secondary | ICD-10-CM | POA: Insufficient documentation

## 2022-08-25 DIAGNOSIS — J320 Chronic maxillary sinusitis: Secondary | ICD-10-CM | POA: Insufficient documentation

## 2022-08-25 DIAGNOSIS — I10 Essential (primary) hypertension: Secondary | ICD-10-CM

## 2022-08-25 LAB — COMPREHENSIVE METABOLIC PANEL, NON-FASTING
ALBUMIN/GLOBULIN RATIO: 1 (ref 0.8–1.4)
ALBUMIN: 3.8 g/dL (ref 3.4–5.0)
ALKALINE PHOSPHATASE: 86 U/L (ref 46–116)
ALT (SGPT): 49 U/L (ref <=78)
ANION GAP: 9 mmol/L (ref 4–13)
AST (SGOT): 43 U/L — ABNORMAL HIGH (ref 15–37)
BILIRUBIN TOTAL: 0.8 mg/dL (ref 0.2–1.0)
BUN/CREA RATIO: 10
BUN: 7 mg/dL (ref 7–18)
CALCIUM, CORRECTED: 9.1 mg/dL
CALCIUM: 8.9 mg/dL (ref 8.5–10.1)
CHLORIDE: 103 mmol/L (ref 98–107)
CO2 TOTAL: 29 mmol/L (ref 21–32)
CREATININE: 0.67 mg/dL (ref 0.55–1.02)
ESTIMATED GFR: 104 mL/min/{1.73_m2} (ref >59)
GLOBULIN: 4
GLUCOSE: 101 mg/dL (ref 74–106)
OSMOLALITY, CALCULATED: 279 mOsm/kg (ref 270–290)
POTASSIUM: 3.6 mmol/L (ref 3.5–5.1)
PROTEIN TOTAL: 7.8 g/dL (ref 6.4–8.2)
SODIUM: 141 mmol/L (ref 136–145)

## 2022-08-25 LAB — CBC WITH DIFF
BASOPHIL #: 0 10*3/uL (ref 0.00–0.30)
BASOPHIL %: 0 % (ref 0–3)
EOSINOPHIL #: 0.24 10*3/uL (ref 0.00–0.80)
EOSINOPHIL %: 4 % (ref 0–7)
HCT: 50.9 % — ABNORMAL HIGH (ref 37.0–47.0)
HGB: 17.2 g/dL — ABNORMAL HIGH (ref 12.5–16.0)
LYMPHOCYTE #: 1.71 10*3/uL (ref 1.10–5.00)
LYMPHOCYTE %: 32 % (ref 25–45)
MCH: 32.6 pg — ABNORMAL HIGH (ref 27.0–32.0)
MCHC: 33.7 g/dL (ref 32.0–36.0)
MCV: 96.5 fL (ref 78.0–99.0)
MONOCYTE #: 0.46 10*3/uL (ref 0.00–1.30)
MONOCYTE %: 9 % (ref 0–12)
MPV: 9.1 fL (ref 7.4–10.4)
NEUTROPHIL #: 2.97 10*3/uL (ref 1.80–8.40)
NEUTROPHIL %: 55 % (ref 40–76)
PLATELETS: 169 10*3/uL (ref 140–440)
RBC: 5.27 10*6/uL (ref 4.20–5.40)
RDW: 15.9 % — ABNORMAL HIGH (ref 11.6–14.8)
WBC: 5.4 10*3/uL (ref 4.0–10.5)

## 2022-08-25 MED ORDER — MECLIZINE 25 MG TABLET
25.0000 mg | ORAL_TABLET | ORAL | Status: AC
Start: 2022-08-26 — End: 2022-08-25
  Administered 2022-08-25: 25 mg via ORAL

## 2022-08-25 MED ORDER — HYDRALAZINE 20 MG/ML INJECTION SOLUTION
INTRAMUSCULAR | Status: AC
Start: 2022-08-25 — End: 2022-08-25
  Filled 2022-08-25: qty 1

## 2022-08-25 MED ORDER — HYDRALAZINE 20 MG/ML INJECTION SOLUTION
10.0000 mg | INTRAMUSCULAR | Status: AC
Start: 2022-08-25 — End: 2022-08-25
  Administered 2022-08-25: 10 mg via INTRAVENOUS

## 2022-08-25 MED ORDER — ONDANSETRON HCL (PF) 4 MG/2 ML INJECTION SOLUTION
4.0000 mg | INTRAMUSCULAR | Status: AC
Start: 2022-08-26 — End: 2022-08-25
  Administered 2022-08-25: 4 mg via INTRAVENOUS

## 2022-08-25 MED ORDER — SODIUM CHLORIDE 0.9 % (FLUSH) INJECTION SYRINGE
3.0000 mL | INJECTION | INTRAMUSCULAR | Status: DC | PRN
Start: 2022-08-25 — End: 2022-08-26
  Administered 2022-08-25: 0 mL

## 2022-08-25 MED ORDER — ACETAMINOPHEN 325 MG TABLET
ORAL_TABLET | ORAL | Status: AC
Start: 2022-08-25 — End: 2022-08-25
  Filled 2022-08-25: qty 3

## 2022-08-25 MED ORDER — SODIUM CHLORIDE 0.9 % (FLUSH) INJECTION SYRINGE
3.0000 mL | INJECTION | Freq: Three times a day (TID) | INTRAMUSCULAR | Status: DC
Start: 2022-08-25 — End: 2022-08-26
  Administered 2022-08-25: 0 mL

## 2022-08-25 MED ORDER — MECLIZINE 25 MG TABLET
ORAL_TABLET | ORAL | Status: AC
Start: 2022-08-25 — End: 2022-08-25
  Filled 2022-08-25: qty 1

## 2022-08-25 MED ORDER — ONDANSETRON HCL (PF) 4 MG/2 ML INJECTION SOLUTION
INTRAMUSCULAR | Status: AC
Start: 2022-08-25 — End: 2022-08-25
  Filled 2022-08-25: qty 2

## 2022-08-25 MED ORDER — MECLIZINE 25 MG TABLET
12.5000 mg | ORAL_TABLET | ORAL | Status: DC
Start: 2022-08-26 — End: 2022-08-25

## 2022-08-25 MED ORDER — ACETAMINOPHEN 325 MG TABLET
975.0000 mg | ORAL_TABLET | ORAL | Status: AC
Start: 2022-08-26 — End: 2022-08-25
  Administered 2022-08-25: 975 mg via ORAL

## 2022-08-25 NOTE — ED Nurses Note (Signed)
Pt c/o high blood pressure. Pt states that she took 60 mg lisinopril around 1700 today and blood pressure elevation has still not resolved. Pt states that she is on a medication for kidney cancer and a side effect of this medication is hypertension. Pt states she has had issues with hypertension but not for this length of time and not this elevated. Pt denies shortness of breath, denies chest pain. Pt placed on monitor, spouse at bedside, call light within reach. Respirations even and unlabored w/ no s/s of acute distress noted at this time.

## 2022-08-25 NOTE — ED Attending Note (Addendum)
[facility] emergency department         HISTORY OF PRESENT ILLNESS     Date:  08/25/2022  Patient's Name:  Taylor Smith  Date of Birth:  Dec 13, 1970    Patient is a 52 year old history of kidney cancer hypertension.  Patient with elevated blood pressures today.  Patient took clonidine and lisinopril about 2 hours prior to coming to the ER.  However patient states her diastolic blood pressure is persistently over 100 prompting her to come see room.  She denies nausea or vomiting there has been no loss of consciousness.  No blurred vision.        Review of Systems     Review of Systems   Constitutional: Negative.    Respiratory: Negative.     Gastrointestinal: Negative.    Neurological:  Positive for light-headedness.   All other systems reviewed and are negative.      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 to other site, left (CMS [facility])        Past Surgical History:  Past Surgical History:   Procedure Laterality Date    Hx cesarean section      Hx cholecystectomy      Hx cyst removal Right     Hx hysterectomy      Total thyroidectomy         Social History:  Social History     Tobacco Use    Smoking status: Never    Smokeless tobacco: Never   Vaping Use    Vaping status: Never Used   Substance Use Topics    Drug use: Never     Social History     Substance and Sexual Activity   Drug Use Never       Family History:  Family History   Problem Relation Age of Onset    Lung Cancer Mother     Cancer Maternal Aunt     Melanoma Maternal Aunt     Colon Cancer Maternal Uncle     Cancer Maternal Grandmother        Medication History:  Current Outpatient Medications   Medication Sig    apixaban (ELIQUIS) 5 mg Oral Tablet Take 1 Tablet  (5 mg total) by mouth Twice daily    axitinib (INLYTA) 5 mg Oral Tablet Take 1 Tablet (5 mg total) by mouth Twice daily    cloNIDine HCL (CATAPRES) 0.1 mg Oral Tablet Take 1 Tablet (0.1 mg total) by mouth Once a day    ergocalciferol, vitamin D2, (DRISDOL) 1,250 mcg (50,000 unit) Oral Capsule Take 1 Capsule (50,000 Units total) by mouth Every 7 days    hydroCHLOROthiazide (MICROZIDE) 12.5 mg Oral Capsule Take 1 Capsule (12.5 mg total) by mouth Once a day Indications: high blood pressure    HYDROcodone-acetaminophen (NORCO) 5-325 mg Oral Tablet Take 2 Tablets by mouth Every 4 hours as needed for Pain    Ibuprofen (MOTRIN) 600 mg Oral Tablet Take 1 Tablet (600 mg total) by mouth Every 6 hours as needed    levothyroxine (SYNTHROID) 125 mcg Oral Tablet Take 1 Tablet (125 mcg total) by mouth Every morning    lisinopriL (PRINIVIL) 40  mg Oral Tablet Take 1 Tablet (40 mg total) by mouth Once a day    omeprazole (PRILOSEC) 40 mg Oral Capsule, Delayed Release(E.C.) Take 1 Capsule (40 mg total) by mouth Once a day    predniSONE (DELTASONE) 10 mg Oral Tablet PLEASE SEE ATTACHED FOR DETAILED DIRECTIONS       Allergies:  No Known Allergies    Physical Exam     Vitals:    BP (!) 187/108   Pulse 61   Temp 36.4 C (97.5 F)   Resp 15   Ht 1.753 m (5\' 9" )   Wt 101 kg (223 lb)   SpO2 96%   BMI 32.93 kg/m           Physical Exam  Vitals and nursing note reviewed.   Constitutional:       General: She is not in acute distress.     Appearance: She is well-developed.   HENT:      Head: Normocephalic and atraumatic.      Right Ear: Tympanic membrane normal.      Left Ear: Tympanic membrane normal.      Nose: Nose normal.   Eyes:      Conjunctiva/sclera: Conjunctivae normal.      Pupils: Pupils are equal, round, and reactive to light.   Cardiovascular:      Rate and Rhythm: Normal rate and regular rhythm.      Pulses: Normal pulses.      Heart sounds: No murmur heard.  Pulmonary:      Effort: Pulmonary effort is normal. No  respiratory distress.      Breath sounds: Normal breath sounds.   Abdominal:      General: Bowel sounds are normal.      Palpations: Abdomen is soft.      Tenderness: There is no abdominal tenderness.   Musculoskeletal:         General: No swelling.      Cervical back: Normal range of motion and neck supple.   Skin:     General: Skin is warm and dry.      Capillary Refill: Capillary refill takes less than 2 seconds.   Neurological:      General: No focal deficit present.      Mental Status: She is alert and oriented to person, place, and time.   Psychiatric:         Mood and Affect: Mood normal.         Diagnostic Studies/Treatment     Medications:  Medications Administered in the ED   NS flush syringe (0 mL Intracatheter Not Given 08/25/22 2200)   NS flush syringe (has no administration in time range)       New Prescriptions    No medications on file       Labs:    Results for orders placed or performed during the hospital encounter of 08/25/22 (from the past 12 hour(s))   COMPREHENSIVE METABOLIC PANEL, NON-FASTING   Result Value Ref Range    SODIUM 141 136 - 145 mmol/L    POTASSIUM 3.6 3.5 - 5.1 mmol/L    CHLORIDE 103 98 - 107 mmol/L    CO2 TOTAL 29 21 - 32 mmol/L    ANION GAP 9 4 - 13 mmol/L    BUN 7 7 - 18 mg/dL    CREATININE 5.28 4.13 - 1.02 mg/dL    BUN/CREA RATIO 10     ESTIMATED GFR 104 >59 mL/min/1.72m^2    ALBUMIN 3.8  3.4 - 5.0 g/dL    CALCIUM 8.9 8.5 - 47.8 mg/dL    GLUCOSE 295 74 - 621 mg/dL    ALKALINE PHOSPHATASE 86 46 - 116 U/L    ALT (SGPT) 49 <=78 U/L    AST (SGOT) 43 (H) 15 - 37 U/L    BILIRUBIN TOTAL 0.8 0.2 - 1.0 mg/dL    PROTEIN TOTAL 7.8 6.4 - 8.2 g/dL    ALBUMIN/GLOBULIN RATIO 1.0 0.8 - 1.4    OSMOLALITY, CALCULATED 279 270 - 290 mOsm/kg    CALCIUM, CORRECTED 9.1 mg/dL    GLOBULIN 4.0    CBC WITH DIFF   Result Value Ref Range    WBC 5.4 4.0 - 10.5 x10^3/uL    RBC 5.27 4.20 - 5.40 x10^6/uL    HGB 17.2 (H) 12.5 - 16.0 g/dL    HCT 30.8 (H) 65.7 - 47.0 %    MCV 96.5 78.0 - 99.0 fL    MCH 32.6 (H)  27.0 - 32.0 pg    MCHC 33.7 32.0 - 36.0 g/dL    RDW 84.6 (H) 96.2 - 14.8 %    PLATELETS 169 140 - 440 x10^3/uL    MPV 9.1 7.4 - 10.4 fL    NEUTROPHIL % 55 40 - 76 %    LYMPHOCYTE % 32 25 - 45 %    MONOCYTE % 9 0 - 12 %    EOSINOPHIL % 4 0 - 7 %    BASOPHIL % 0 0 - 3 %    NEUTROPHIL # 2.97 1.80 - 8.40 x10^3/uL    LYMPHOCYTE # 1.71 1.10 - 5.00 x10^3/uL    MONOCYTE # 0.46 0.00 - 1.30 x10^3/uL    EOSINOPHIL # 0.24 0.00 - 0.80 x10^3/uL    BASOPHIL # 0.00 0.00 - 0.30 x10^3/uL        Radiology:  None    No orders to display       ECG:  NONE            Differential diagnosis  1. Uncontrolled hypertension, history of renal cancer     Course/Disposition/Plan     Course:    Patient will be monitored with blood pressures.  Patient did take lisinopril and clonidine about 2 hours prior to coming to the ER.  Baseline labs have been drawn    Disposition:    Discharged    Condition at Disposition:     Stable  Follow up:   Macario Golds, FNP-C  [address]  [place] [address]  [phone]    Schedule an appointment as soon as possible for a visit         Clinical Impression:     Clinical Impression   Uncontrolled hypertension (Primary)         Tivis Ringer, MD

## 2022-08-25 NOTE — ED Attending Note (Incomplete)
[facility] emergency department         HISTORY OF PRESENT ILLNESS     Date:  08/25/2022  Patient's Name:  Taylor Smith  Date of Birth:  October 16, 1970    Patient is a 52 year old history of kidney cancer hypertension.  Patient with elevated blood pressures today.  Patient took clonidine and lisinopril about 2 hours prior to coming to the ER.  However patient states her diastolic blood pressure is persistently over 100 prompting her to come see room.  She denies nausea or vomiting there has been no loss of consciousness.  No blurred vision.        Review of Systems     Review of Systems   Constitutional: Negative.    Respiratory: Negative.     Gastrointestinal: Negative.    Neurological:  Positive for light-headedness.   All other systems reviewed and are negative.      Previous History     Past Medical History:  Past Medical History:   Diagnosis Date   . Family history of colon cancer requiring screening colonoscopy    . HTN (hypertension)    . 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 Kidney cancer, primary, with metastasis from kidney to other site, left (CMS [facility])        Past Surgical History:  Past Surgical History:   Procedure Laterality Date   . Hx cesarean section     . Hx cholecystectomy     . Hx cyst removal Right    . Hx hysterectomy     . Total thyroidectomy         Social History:  Social History     Tobacco Use   . Smoking status: Never   . Smokeless tobacco: Never   Vaping Use   . Vaping status: Never Used   Substance Use Topics   . Drug use: Never     Social History     Substance and Sexual Activity   Drug Use Never       Family History:  Family History   Problem Relation Age of Onset   . Lung Cancer Mother    . Cancer Maternal Aunt    . Melanoma Maternal Aunt    . Colon Cancer Maternal Uncle    . Cancer Maternal Grandmother        Medication History:  Current Outpatient Medications   Medication Sig   . apixaban (ELIQUIS) 5 mg Oral  Tablet Take 1 Tablet (5 mg total) by mouth Twice daily   . axitinib (INLYTA) 5 mg Oral Tablet Take 1 Tablet (5 mg total) by mouth Twice daily   . cloNIDine HCL (CATAPRES) 0.1 mg Oral Tablet Take 1 Tablet (0.1 mg total) by mouth Once a day   . ergocalciferol, vitamin D2, (DRISDOL) 1,250 mcg (50,000 unit) Oral Capsule Take 1 Capsule (50,000 Units total) by mouth Every 7 days   . hydroCHLOROthiazide (MICROZIDE) 12.5 mg Oral Capsule Take 1 Capsule (12.5 mg total) by mouth Once a day Indications: high blood pressure   . HYDROcodone-acetaminophen (NORCO) 5-325 mg Oral Tablet Take 2 Tablets by mouth Every 4 hours as needed for Pain   . Ibuprofen (MOTRIN) 600 mg Oral Tablet Take 1 Tablet (600 mg total) by mouth Every 6 hours as needed   . levothyroxine (SYNTHROID) 125 mcg Oral Tablet Take 1 Tablet (125 mcg total) by mouth Every morning   . lisinopriL (PRINIVIL) 40  mg Oral Tablet Take 1 Tablet (40 mg total) by mouth Once a day   . omeprazole (PRILOSEC) 40 mg Oral Capsule, Delayed Release(E.C.) Take 1 Capsule (40 mg total) by mouth Once a day   . predniSONE (DELTASONE) 10 mg Oral Tablet PLEASE SEE ATTACHED FOR DETAILED DIRECTIONS       Allergies:  No Known Allergies    Physical Exam     Vitals:    BP (!) 154/92   Pulse 77   Temp 36.4 C (97.5 F)   Resp 17   Ht 1.753 m (5\' 9" )   Wt 101 kg (223 lb)   SpO2 95%   BMI 32.93 kg/m           Physical Exam  Vitals and nursing note reviewed.   Constitutional:       General: She is not in acute distress.     Appearance: She is well-developed.   HENT:      Head: Normocephalic and atraumatic.      Right Ear: Tympanic membrane normal.      Left Ear: Tympanic membrane normal.      Nose: Nose normal.   Eyes:      Conjunctiva/sclera: Conjunctivae normal.      Pupils: Pupils are equal, round, and reactive to light.   Cardiovascular:      Rate and Rhythm: Normal rate and regular rhythm.      Pulses: Normal pulses.      Heart sounds: No murmur heard.  Pulmonary:      Effort: Pulmonary  effort is normal. No respiratory distress.      Breath sounds: Normal breath sounds.   Abdominal:      General: Bowel sounds are normal.      Palpations: Abdomen is soft.      Tenderness: There is no abdominal tenderness.   Musculoskeletal:         General: No swelling.      Cervical back: Normal range of motion and neck supple.   Skin:     General: Skin is warm and dry.      Capillary Refill: Capillary refill takes less than 2 seconds.   Neurological:      General: No focal deficit present.      Mental Status: She is alert and oriented to person, place, and time.   Psychiatric:         Mood and Affect: Mood normal.         Diagnostic Studies/Treatment     Medications:  Medications Administered in the ED   NS flush syringe (0 mL Intracatheter Not Given 08/25/22 2200)   NS flush syringe (0 mL Intracatheter Not Given 08/25/22 2211)   hydrALAZINE (APRESOLINE) injection 10 mg (10 mg Intravenous Given 08/25/22 2204)   hydrALAZINE (APRESOLINE) injection 10 mg (10 mg Intravenous Given 08/25/22 2228)   acetaminophen (TYLENOL) tablet (975 mg Oral Given 08/25/22 2314)   ondansetron (ZOFRAN) 2 mg/mL injection (4 mg Intravenous Given 08/25/22 2345)   meclizine (ANTIVERT) tablet (25 mg Oral Given 08/25/22 2345)       New Prescriptions    No medications on file       Labs:    Results for orders placed or performed during the hospital encounter of 08/25/22 (from the past 12 hour(s))   COMPREHENSIVE METABOLIC PANEL, NON-FASTING   Result Value Ref Range    SODIUM 141 136 - 145 mmol/L    POTASSIUM 3.6 3.5 - 5.1 mmol/L    CHLORIDE 103  98 - 107 mmol/L    CO2 TOTAL 29 21 - 32 mmol/L    ANION GAP 9 4 - 13 mmol/L    BUN 7 7 - 18 mg/dL    CREATININE 1.61 0.96 - 1.02 mg/dL    BUN/CREA RATIO 10     ESTIMATED GFR 104 >59 mL/min/1.50m^2    ALBUMIN 3.8 3.4 - 5.0 g/dL    CALCIUM 8.9 8.5 - 04.5 mg/dL    GLUCOSE 409 74 - 811 mg/dL    ALKALINE PHOSPHATASE 86 46 - 116 U/L    ALT (SGPT) 49 <=78 U/L    AST (SGOT) 43 (H) 15 - 37 U/L    BILIRUBIN TOTAL 0.8 0.2 - 1.0  mg/dL    PROTEIN TOTAL 7.8 6.4 - 8.2 g/dL    ALBUMIN/GLOBULIN RATIO 1.0 0.8 - 1.4    OSMOLALITY, CALCULATED 279 270 - 290 mOsm/kg    CALCIUM, CORRECTED 9.1 mg/dL    GLOBULIN 4.0    CBC WITH DIFF   Result Value Ref Range    WBC 5.4 4.0 - 10.5 x10^3/uL    RBC 5.27 4.20 - 5.40 x10^6/uL    HGB 17.2 (H) 12.5 - 16.0 g/dL    HCT 91.4 (H) 78.2 - 47.0 %    MCV 96.5 78.0 - 99.0 fL    MCH 32.6 (H) 27.0 - 32.0 pg    MCHC 33.7 32.0 - 36.0 g/dL    RDW 95.6 (H) 21.3 - 14.8 %    PLATELETS 169 140 - 440 x10^3/uL    MPV 9.1 7.4 - 10.4 fL    NEUTROPHIL % 55 40 - 76 %    LYMPHOCYTE % 32 25 - 45 %    MONOCYTE % 9 0 - 12 %    EOSINOPHIL % 4 0 - 7 %    BASOPHIL % 0 0 - 3 %    NEUTROPHIL # 2.97 1.80 - 8.40 x10^3/uL    LYMPHOCYTE # 1.71 1.10 - 5.00 x10^3/uL    MONOCYTE # 0.46 0.00 - 1.30 x10^3/uL    EOSINOPHIL # 0.24 0.00 - 0.80 x10^3/uL    BASOPHIL # 0.00 0.00 - 0.30 x10^3/uL        Radiology:  None    No orders to display       ECG:  NONE            Differential diagnosis  1. Uncontrolled hypertension, history of renal cancer     Course/Disposition/Plan     Course:    Patient will be monitored with blood pressures.  Patient did take lisinopril and clonidine about 2 hours prior to coming to the ER.  Baseline labs have been drawn    Disposition:    Discharged    Condition at Disposition:     Stable  Follow up:   Macario Golds, FNP-C  [address]  [place] [address]  [phone]    Schedule an appointment as soon as possible for a visit         Clinical Impression:     Clinical Impression   Uncontrolled hypertension (Primary)         Tivis Ringer, MD

## 2022-08-25 NOTE — ED Triage Notes (Signed)
PT states that her BP has been elevateda ll day. Highest at home was 177/105. BP in triage is 192/114. Pt states that she takes Inylta that causes her to have elevated BP but it's never been this high before. Pt denies any chest pain, sob, or dizziness.

## 2022-08-26 ENCOUNTER — Emergency Department (EMERGENCY_DEPARTMENT_HOSPITAL)
Admission: EM | Admit: 2022-08-26 | Discharge: 2022-08-26 | Disposition: A | Discharge status: Home / Self Care | Payer: 59 | Source: Home / Self Care | Attending: FAMILY PRACTICE | Admitting: FAMILY PRACTICE

## 2022-08-26 ENCOUNTER — Encounter (HOSPITAL_BASED_OUTPATIENT_CLINIC_OR_DEPARTMENT_OTHER): Payer: Self-pay

## 2022-08-26 ENCOUNTER — Emergency Department (HOSPITAL_BASED_OUTPATIENT_CLINIC_OR_DEPARTMENT_OTHER): Payer: 59

## 2022-08-26 ENCOUNTER — Telehealth (INDEPENDENT_AMBULATORY_CARE_PROVIDER_SITE_OTHER): Payer: Self-pay | Admitting: NURSE PRACTITIONER

## 2022-08-26 ENCOUNTER — Other Ambulatory Visit: Payer: Self-pay

## 2022-08-26 DIAGNOSIS — R519 Headache, unspecified: Secondary | ICD-10-CM

## 2022-08-26 DIAGNOSIS — I10 Essential (primary) hypertension: Secondary | ICD-10-CM

## 2022-08-26 DIAGNOSIS — R112 Nausea with vomiting, unspecified: Secondary | ICD-10-CM | POA: Insufficient documentation

## 2022-08-26 DIAGNOSIS — G4489 Other headache syndrome: Secondary | ICD-10-CM

## 2022-08-26 LAB — BLUE TOP TUBE

## 2022-08-26 LAB — GRAY TOP TUBE

## 2022-08-26 MED ORDER — AMOXICILLIN 875 MG-POTASSIUM CLAVULANATE 125 MG TABLET
1.0000 | ORAL_TABLET | Freq: Two times a day (BID) | ORAL | 0 refills | Status: DC
Start: 2022-08-26 — End: 2022-09-29

## 2022-08-26 MED ORDER — PROCHLORPERAZINE EDISYLATE 10 MG/2 ML (5 MG/ML) INJECTION SOLUTION
10.0000 mg | INTRAMUSCULAR | Status: AC
Start: 2022-08-26 — End: 2022-08-26
  Administered 2022-08-26: 10 mg via INTRAVENOUS

## 2022-08-26 MED ORDER — ONDANSETRON HCL (PF) 4 MG/2 ML INJECTION SOLUTION
4.0000 mg | INTRAMUSCULAR | Status: AC
Start: 2022-08-26 — End: 2022-08-26
  Administered 2022-08-26: 4 mg via INTRAVENOUS

## 2022-08-26 MED ORDER — ACETAMINOPHEN 325 MG TABLET
ORAL_TABLET | ORAL | Status: AC
Start: 2022-08-26 — End: 2022-08-26
  Filled 2022-08-26: qty 2

## 2022-08-26 MED ORDER — SODIUM CHLORIDE 0.9 % (FLUSH) INJECTION SYRINGE
3.0000 mL | INJECTION | INTRAMUSCULAR | Status: DC | PRN
Start: 2022-08-26 — End: 2022-08-26

## 2022-08-26 MED ORDER — ENALAPRILAT 1.25 MG/ML INTRAVENOUS SOLUTION
INTRAVENOUS | Status: AC
Start: 2022-08-26 — End: 2022-08-26
  Filled 2022-08-26: qty 2

## 2022-08-26 MED ORDER — ACETAMINOPHEN 325 MG TABLET
650.0000 mg | ORAL_TABLET | ORAL | Status: AC
Start: 2022-08-26 — End: 2022-08-26
  Administered 2022-08-26: 650 mg via ORAL

## 2022-08-26 MED ORDER — HYDROMORPHONE 2 MG/ML INJECTION WRAPPER
0.5000 mg | INJECTION | INTRAMUSCULAR | Status: AC
Start: 2022-08-26 — End: 2022-08-26
  Administered 2022-08-26: 0.5 mg via INTRAVENOUS

## 2022-08-26 MED ORDER — AMLODIPINE 5 MG TABLET
ORAL_TABLET | ORAL | Status: AC
Start: 2022-08-26 — End: 2022-08-26
  Filled 2022-08-26: qty 1

## 2022-08-26 MED ORDER — HYDRALAZINE 20 MG/ML INJECTION SOLUTION
INTRAMUSCULAR | Status: AC
Start: 2022-08-26 — End: 2022-08-26
  Filled 2022-08-26: qty 1

## 2022-08-26 MED ORDER — HYDROMORPHONE 2 MG/ML INJECTION WRAPPER
INJECTION | INTRAMUSCULAR | Status: AC
Start: 2022-08-26 — End: 2022-08-26
  Filled 2022-08-26: qty 1

## 2022-08-26 MED ORDER — ENALAPRILAT 1.25 MG/ML INTRAVENOUS SOLUTION
0.6250 mg | INTRAVENOUS | Status: AC
Start: 2022-08-26 — End: 2022-08-26
  Administered 2022-08-26: 0.625 mg via INTRAVENOUS

## 2022-08-26 MED ORDER — BUPRENORPHINE HCL 0.3 MG/ML INJECTION SOLUTION
0.1500 mg | INTRAMUSCULAR | Status: AC
Start: 2022-08-26 — End: 2022-08-26
  Administered 2022-08-26: 0.15 mg via INTRAVENOUS

## 2022-08-26 MED ORDER — ONDANSETRON HCL (PF) 4 MG/2 ML INJECTION SOLUTION
INTRAMUSCULAR | Status: AC
Start: 2022-08-26 — End: 2022-08-26
  Filled 2022-08-26: qty 2

## 2022-08-26 MED ORDER — AMLODIPINE 5 MG TABLET
5.0000 mg | ORAL_TABLET | ORAL | Status: AC
Start: 2022-08-26 — End: 2022-08-26
  Administered 2022-08-26: 5 mg via ORAL

## 2022-08-26 MED ORDER — SODIUM CHLORIDE 0.9 % INTRAVENOUS PIGGYBACK
1.0000 g | INTRAVENOUS | Status: AC
Start: 2022-08-26 — End: 2022-08-26
  Administered 2022-08-26: 1 g via INTRAVENOUS
  Administered 2022-08-26: 0 g via INTRAVENOUS

## 2022-08-26 MED ORDER — BUPRENORPHINE HCL 0.3 MG/ML INJECTION SOLUTION
INTRAMUSCULAR | Status: AC
Start: 2022-08-26 — End: 2022-08-26
  Filled 2022-08-26: qty 1

## 2022-08-26 MED ORDER — CEFTRIAXONE 1 GRAM SOLUTION FOR INJECTION
INTRAMUSCULAR | Status: AC
Start: 2022-08-26 — End: 2022-08-26
  Filled 2022-08-26: qty 10

## 2022-08-26 MED ORDER — HYDRALAZINE 20 MG/ML INJECTION SOLUTION
10.0000 mg | INTRAMUSCULAR | Status: AC
Start: 2022-08-26 — End: 2022-08-26
  Administered 2022-08-26: 10 mg via INTRAVENOUS

## 2022-08-26 MED ORDER — SODIUM CHLORIDE 0.9 % (FLUSH) INJECTION SYRINGE
3.0000 mL | INJECTION | Freq: Three times a day (TID) | INTRAMUSCULAR | Status: DC
Start: 2022-08-26 — End: 2022-08-26
  Administered 2022-08-26: 3 mL

## 2022-08-26 MED ORDER — PROCHLORPERAZINE EDISYLATE 10 MG/2 ML (5 MG/ML) INJECTION SOLUTION
INTRAMUSCULAR | Status: AC
Start: 2022-08-26 — End: 2022-08-26
  Filled 2022-08-26: qty 2

## 2022-08-26 MED ORDER — LACTATED RINGERS IV BOLUS
500.0000 mL | INJECTION | Status: AC
Start: 2022-08-26 — End: 2022-08-26
  Administered 2022-08-26: 0 mL via INTRAVENOUS
  Administered 2022-08-26: 500 mL via INTRAVENOUS

## 2022-08-26 MED ORDER — BUTALBITAL-ACETAMINOPHEN-CAFFEINE 50 MG-300 MG-40 MG CAPSULE
1.0000 | ORAL_CAPSULE | Freq: Three times a day (TID) | ORAL | 0 refills | Status: DC | PRN
Start: 2022-08-26 — End: 2022-09-29

## 2022-08-26 NOTE — ED Nurses Note (Signed)
Stated having dry heaves about 10 nin ago and H/A staring to come back rates 3-4/10  provider informed

## 2022-08-26 NOTE — ED Nurses Note (Signed)
Patient discharged home with family.  AVS reviewed with patient/care giver.  A written copy of the AVS and discharge instructions was given to the patient/care giver. Scripts handed to patient/care giver. Questions sufficiently answered as needed.  Patient/care giver encouraged to follow up with PCP as indicated.  In the event of an emergency, patient/care giver instructed to call 911 or go to the nearest emergency room.

## 2022-08-26 NOTE — ED Nurses Note (Signed)
Resting quietly eyes closed even unlabored respirations

## 2022-08-26 NOTE — ED Provider Notes (Signed)
[facility] Emergency Department  ED Primary Provider Note  History of Present Illness   Chief Complaint   Patient presents with   . Headache     Taylor Smith is a 52 y.o. female who had concerns including Headache.  Arrival: The patient arrived by Car    This 52 year old female patient presents to the emergency department today with headache, and frontal area in the top of her head.  She was here overnight, CT scan laboratory data performed and was given Dilaudid for pain went home and had nausea and vomiting.  With a recurrence of nausea and vomiting her pain worsened and she was here again with her triage vitals having a BP of 175/109, pulse of 77 respiratory rate of 18 and 96% oxygen saturations and she was afebrile at 97.6 F.  She was sinusitis per the CT of the head.  Her CMP had an AST of 43 otherwise within normal limits, CBC was also good.  She was no significant photophobia, phonophobia, no paresthesias or weaknesses.  She was has a severe headache described as 1 of the worse she can ever remember and associated nausea with vomiting with the elevated blood pressure.  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  Past surgical history includes total abdominal hysterectomy with bilateral salpingo-oophorectomy, gallbladder, thyroidectomy, cesarean section.  She was never smoked, vaped, use alcohol, marijuana or illicit drugs.    She was Pfizer COVID vaccinated without boosters, has never had COVID.      History Reviewed This Encounter:  Patient's past medical, surgical, social history reviewed noted.    Physical Exam   ED Triage Vitals [08/26/22 0723]   BP (Non-Invasive) (!) 175/109   Heart Rate 77   Respiratory Rate 18   Temperature 36.4 C (97.6 F)   SpO2 96 %   Weight 101 kg (223 lb)   Height 1.753 m (5\' 9" )     Physical Exam  General: Patient is sitting  upright in the bed, looks uncomfortable.  Face: No facial asymmetries.    Eyes:  Pupils proximally 4 mm and symmetrical, lights are not on in the room.  Ears:  TMs clear without fluid or retraction   Nasal:  Boggy moist mucosa, patent bilaterally   Oral:  Pink and moist, tongue protrusion midline   Pharynx: Pink and moist  Neck:  No adenopathy, trachea is in midline.  She was no paracervical fullness, tenderness, or pain to palpation.    Lungs:  Clear symmetrical good aeration   Heart: Regular rate rhythm normal S1-S2 without murmur gallop   Abdomen:  Soft, normal bowel sounds, minimal tenderness in the right upper quadrant of the abdomen without guarding, rebound, or referred pain.    Extremities:  Moving symmetrically   Skin:  No suspicious rashes or lesions  Neurological:  No focal motor or sensory deficits.  Cranial nerves 2 through 12 symmetrical with the exception of taste smell not assessed.  Good light sensation touch noted, motor strong and symmetrical at 2+ for bilaterally in the upper and lower extremities.  Deep tendon reflexes are 2/4 and symmetrical in the biceps tendon and patellar tendons.      Patient Data   Labs Ordered/Reviewed - No data to display  No orders to display     Medical Decision Making        Medical Decision Making  Moderate complexity straightforward:  Headache  syndrome, not typical migraine.  Has a history of menstrual migraines, this is not like her previous migraines prior to her hysterectomy.  Laboratory data reviewed, blood pressure is greatly elevated.  Will treat with 500 cc of lactated Ringer's, and a Buprenex, blood pressure is still elevated she will get IV enalapril and Norvasc.  She vomited were normal rash, enalapril temporarily lowered her blood pressure so she was given 10 mg of IV hydralazine.  Your headache and nausea have resolved but her blood pressure is still elevated.  She was no photophobia with her headache resolved.  Once her blood pressure comes down, if she  was still asymptomatic she will be discharged.  Laboratory data was not repeated since they were done last night with her presentation to the ER with Dr. Tawanna Solo.    Amount and/or Complexity of Data Reviewed  Labs:  Decision-making details documented in ED Course.  Radiology:  Decision-making details documented in ED Course.    Risk  Prescription drug management.  Parenteral controlled substances.      ED Course as of 08/26/22 1152   Tue Aug 26, 2022   9147 Patient vomited her Norvasc up shortly after she took it, she did get her IV enalapril which temporarily lowered her blood pressure into the 160 systolic range, I just walked back into the room in her blood pressure is 183/106.  She was no further headache or nausea currently.  We will give her 10 mg of hydralazine IV and monitor her blood pressure.   1103 Patient's BP is 165/90, previously 162/92, and 152/92 once the hydralazine began to reduce her blood pressures.  She will be discharged to home, she was asymptomatic with no further headache or nausea.  These close follow up with the primary care provider and oncologist.   1152 At discharge the patient began to complain of a headache of the about 3-4 of 10 and some mild nausea, we will give her Compazine 10 mg IV, and 650 mg of Tylenol p.o. and reassess before discharging her out the door.            Medications Administered in the ED   NS flush syringe (3 mL Intracatheter Given 08/26/22 0800)   NS flush syringe (has no administration in time range)   amLODIPine (NORVASC) tablet (5 mg Oral Given 08/26/22 0840)   enalaprilat (VASOTEC) 1.25 mg/mL injection (0.625 mg Intravenous Given 08/26/22 0842)   LR bolus infusion 500 mL (0 mL Intravenous Stopped 08/26/22 0912)   ondansetron (ZOFRAN) 2 mg/mL injection (4 mg Intravenous Given 08/26/22 0841)   buprenorphine (BUPRENEX) 0.3 mg/mL injection (0.15 mg Intravenous Given 08/26/22 0841)   hydrALAZINE (APRESOLINE) injection 10 mg (10 mg Intravenous Given 08/26/22 1000)     Clinical  Impression   Severe frontal headaches (Primary)   Elevated blood pressure reading with diagnosis of hypertension   Nausea with vomiting       Disposition: Discharged       .Marland KitchenSondra Come, DO

## 2022-08-26 NOTE — ED Nurses Note (Signed)
Pt verbalized understanding of discharge instructions.Denies any questions or concerns at this time. Respirations even and unlabored w/ no s/s of acute distress noted at this time. Pt taken to vehicle by wheelchair.

## 2022-08-26 NOTE — ED Nurses Note (Signed)
Awake alert stated H/A gone 0/10 and no further N/V   BP still up medicated with Apresoline

## 2022-08-26 NOTE — ED Nurses Note (Signed)
Pt states she is felling better at this time. Respirations even and unlabored w/ no s/s of acute distress noted at this time. Spouse at bedside, call light within reach.

## 2022-08-26 NOTE — Discharge Instructions (Addendum)
You nausea improved as well, hopefully your blood pressure will continue to come down.  You need to call your primary care provider and your oncologist for follow-up due to your headache, and the rebound nausea and vomiting with extremely elevated blood pressure.    Continue home medications as prescribed, call with your primary care provider and oncologist.

## 2022-08-26 NOTE — ED Triage Notes (Signed)
Pt states she was here last night with a headache and elevated blood pressure, states when she went home during the night she started vomiting and headache has continued.

## 2022-08-26 NOTE — Discharge Instructions (Addendum)
Take your blood pressure twice daily continue medications as previously prescribed follow-up with your physician within the next week for blood pressure management

## 2022-08-27 ENCOUNTER — Telehealth (INDEPENDENT_AMBULATORY_CARE_PROVIDER_SITE_OTHER): Payer: Self-pay | Admitting: NURSE PRACTITIONER

## 2022-08-27 NOTE — Telephone Encounter (Signed)
Francina Ames LPN. Spoke with patient and she has an appointment with Macario Golds on 11-28-22. Follow up on her blood pressure.

## 2022-08-28 ENCOUNTER — Ambulatory Visit (INDEPENDENT_AMBULATORY_CARE_PROVIDER_SITE_OTHER): Payer: 59 | Admitting: NURSE PRACTITIONER

## 2022-08-28 ENCOUNTER — Encounter (INDEPENDENT_AMBULATORY_CARE_PROVIDER_SITE_OTHER): Payer: Self-pay | Admitting: NURSE PRACTITIONER

## 2022-08-28 ENCOUNTER — Other Ambulatory Visit: Payer: Self-pay

## 2022-08-28 VITALS — BP 164/94 | HR 60 | Ht 69.02 in | Wt 223.0 lb

## 2022-08-28 DIAGNOSIS — I10 Essential (primary) hypertension: Secondary | ICD-10-CM

## 2022-08-28 MED ORDER — CLONIDINE 0.3 MG/24 HR WEEKLY TRANSDERMAL PATCH
0.3000 mg | MEDICATED_PATCH | TRANSDERMAL | 0 refills | Status: DC
Start: 2022-08-28 — End: 2022-08-29

## 2022-08-28 NOTE — Progress Notes (Signed)
[facility], [facility]  [address]  [place] [id number]       Name: Taylor Smith MRN:  [id number]   Date: 08/28/2022 Age: 52 y.o.       Chief Complaint:    Chief Complaint   Patient presents with    Hospital Follow Up     Hospital f/u. Pt presented to the er on 08/25/22 and on 08/26/22 with elevated BP and a headache. Pt was treated for a sinus infection. Was presrcibed Augmentin 875-125mg  BID x 10 days and was given a solu-medrol injection in the hospital. States the headaches have subsided, but BP is still staying elevated.         HPI:  Taylor Smith is a 51 y.o. female who is here today for f/u after being seen in the ER with severely elevated blood pressure and headaches. Patient is currently still taking Augmentin for treatment of sinus infection from ER and states that headaches have improved.   Patient has still been checking blood pressures at home and they are similar to what it is today in office with systolics in 160's and diastolics in the 90's-100.  She denies having any chest pain/pressure, shortness of breath or visual disturbance.         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 to other site, left (CMS [facility])          Past Surgical History:   Procedure Laterality Date    HX CESAREAN SECTION      HX CHOLECYSTECTOMY      HX CYST REMOVAL Right     HX HYSTERECTOMY      TOTAL THYROIDECTOMY        Current Outpatient Medications   Medication Sig    amoxicillin-pot clavulanate (AUGMENTIN) 875-125 mg Oral Tablet Take 1 Tablet by mouth Twice daily for 10 days    apixaban (ELIQUIS) 5 mg Oral Tablet Take 1 Tablet (5 mg total) by mouth Twice daily    axitinib (INLYTA) 5 mg Oral Tablet Take 1 Tablet (5 mg total) by mouth Twice daily     Butalbital-Acetaminophen-Caff 50-300-40 mg Oral Capsule Take 1 Capsule by mouth Three times a day as needed for up to 5 days    cloNIDine (CATAPRES-TTS) 0.3 mg/24 hr Transdermal Patch Weekly Place 1 Patch (0.3 mg total) on the skin Every 7 days Indications: high blood pressure    ergocalciferol, vitamin D2, (DRISDOL) 1,250 mcg (50,000 unit) Oral Capsule Take 1 Capsule (50,000 Units total) by mouth Every 7 days    hydroCHLOROthiazide (MICROZIDE) 12.5 mg Oral Capsule Take 1 Capsule (12.5 mg total) by mouth Once a day Indications: high blood pressure    HYDROcodone-acetaminophen (NORCO) 5-325 mg Oral Tablet Take 2 Tablets by mouth Every 4 hours as needed for Pain (Patient not taking: Reported on 08/28/2022)    Ibuprofen (MOTRIN) 600 mg Oral Tablet Take 1 Tablet (600 mg total) by mouth Every 6 hours as needed    levothyroxine (SYNTHROID) 125 mcg Oral Tablet Take 1 Tablet (125 mcg total) by mouth Every morning    lisinopriL (PRINIVIL) 40 mg Oral Tablet Take 1 Tablet (40 mg total) by mouth Once a day    omeprazole (PRILOSEC) 40 mg  Oral Capsule, Delayed Release(E.C.) Take 1 Capsule (40 mg total) by mouth Once a day    predniSONE (DELTASONE) 10 mg Oral Tablet PLEASE SEE ATTACHED FOR DETAILED DIRECTIONS (Patient not taking: Reported on 08/28/2022)     No Known Allergies    Family History:  Family Medical History:       Problem Relation (Age of Onset)    Cancer Maternal Aunt, Maternal Grandmother    Colon Cancer Maternal Uncle    Lung Cancer Mother    Melanoma Maternal Aunt              Social History:   Social History     Tobacco Use   Smoking Status Never   Smokeless Tobacco Never     Social History     Substance and Sexual Activity   Alcohol Use None     Social History     Occupational History    Not on file       Review of Systems:  Review of systems as discussed in HPI    Problem List:  Patient Active Problem List   Diagnosis    Vitamin D deficiency    Hypothyroid    GERD (gastroesophageal reflux disease)    Primary  hypertension    Neural foraminal stenosis of cervical spine    Osteophyte of cervical spine    S/P hysterectomy    Kidney cancer, primary, 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   Constitutional:       General: She is not in acute distress.     Appearance: Normal appearance.   HENT:      Head: Normocephalic and atraumatic.   Eyes:      Extraocular Movements: Extraocular movements intact.      Pupils: Pupils are equal, round, and reactive to light.   Cardiovascular:      Rate and Rhythm: Normal rate.      Pulses: Normal pulses.      Heart sounds: Normal heart sounds, S1 normal and S2 normal.   Pulmonary:      Effort: Pulmonary effort is normal.      Breath sounds: Normal breath sounds.   Abdominal:      General: Abdomen is flat. Bowel sounds are normal.      Palpations: Abdomen is soft.   Skin:     General: Skin is warm and dry.      Capillary Refill: Capillary refill takes less than 2 seconds.   Neurological:      General: No focal deficit present.      Mental Status: She is alert and oriented to person, place, and time.   Psychiatric:         Mood and Affect: Mood normal.        Data Reviewed:  Admission on 08/25/2022, Discharged on 08/26/2022   Component Date Value Ref Range Status    SODIUM 08/25/2022 141  136 - 145 mmol/L Final    POTASSIUM 08/25/2022 3.6  3.5 - 5.1 mmol/L Final    CHLORIDE 08/25/2022 103  98 - 107 mmol/L Final    CO2 TOTAL 08/25/2022 29  21 - 32 mmol/L Final    ANION GAP 08/25/2022 9  4 - 13 mmol/L Final    BUN 08/25/2022 7  7 - 18 mg/dL Final    CREATININE 16/01/9603 0.67  0.55 - 1.02 mg/dL Final    BUN/CREA RATIO 08/25/2022 10   Final    ESTIMATED GFR 08/25/2022 104  >59 mL/min/1.54m^2 Final    ALBUMIN 08/25/2022 3.8  3.4 - 5.0 g/dL  Final    Previously suppressed result.    CALCIUM 08/25/2022 8.9  8.5 - 10.1 mg/dL Final    GLUCOSE 54/12/8117 101  74 - 106 mg/dL Final    ALKALINE PHOSPHATASE 08/25/2022 86  46 - 116 U/L Final    Previously suppressed result.    ALT (SGPT) 08/25/2022 49  <=78 U/L Final    Previously suppressed result.    AST (SGOT) 08/25/2022 43 (H)  15 - 37 U/L Final    Previously suppressed result.    BILIRUBIN TOTAL 08/25/2022 0.8  0.2 - 1.0 mg/dL Final    Previously suppressed result.    PROTEIN TOTAL 08/25/2022 7.8  6.4 - 8.2 g/dL Final    Previously suppressed result.    ALBUMIN/GLOBULIN RATIO 08/25/2022 1.0  0.8 - 1.4 Final    Comment: Calculation not performed.                               Previously suppressed result.    OSMOLALITY, CALCULATED 08/25/2022 279  270 - 290 mOsm/kg Final    CALCIUM, CORRECTED 08/25/2022 9.1  mg/dL Final    Previously suppressed result.    GLOBULIN 08/25/2022 4.0   Final    Comment: Calculation not performed.    Calculation not performed.                               Previously suppressed result.    WBC 08/25/2022 5.4  4.0 - 10.5 x10^3/uL Final    RBC 08/25/2022 5.27  4.20 - 5.40 x10^6/uL Final    HGB 08/25/2022 17.2 (H)  12.5 - 16.0 g/dL Final    HCT 14/78/2956 50.9 (H)  37.0 - 47.0 % Final    MCV 08/25/2022 96.5  78.0 - 99.0 fL Final    MCH 08/25/2022 32.6 (H)  27.0 - 32.0 pg Final    MCHC 08/25/2022 33.7  32.0 - 36.0 g/dL Final    RDW 21/30/8657 15.9 (H)  11.6 - 14.8 % Final    PLATELETS 08/25/2022 169  140 - 440 x10^3/uL Final    MPV 08/25/2022 9.1  7.4 - 10.4 fL Final    NEUTROPHIL % 08/25/2022 55  40 - 76 % Final    LYMPHOCYTE % 08/25/2022 32  25 - 45 % Final    MONOCYTE % 08/25/2022 9  0 - 12 % Final    EOSINOPHIL % 08/25/2022 4  0 - 7 % Final    BASOPHIL % 08/25/2022 0  0 - 3 % Final    NEUTROPHIL # 08/25/2022 2.97  1.80 - 8.40 x10^3/uL Final    LYMPHOCYTE # 08/25/2022 1.71  1.10 - 5.00 x10^3/uL Final    MONOCYTE # 08/25/2022 0.46  0.00 - 1.30 x10^3/uL Final    EOSINOPHIL # 08/25/2022  0.24  0.00 - 0.80 x10^3/uL Final    BASOPHIL # 08/25/2022 0.00  0.00 - 0.30 x10^3/uL Final    RAINBOW/EXTRA TUBE AUTO RESULT 08/25/2022 Yes  Final    RAINBOW/EXTRA TUBE AUTO RESULT 08/25/2022 Yes   Final   Hospital Outpatient Visit on 07/11/2022   Component Date Value Ref Range Status    SODIUM 07/11/2022 138  136 - 145 mmol/L Final    POTASSIUM 07/11/2022 3.8  3.5 - 5.1 mmol/L Final    CHLORIDE 07/11/2022 102  98 - 107 mmol/L Final    CO2 TOTAL 07/11/2022 29  21 - 31 mmol/L Final    ANION GAP 07/11/2022 7  4 - 13 mmol/L Final    BUN 07/11/2022 8  7 - 25 mg/dL Final    CREATININE 57/84/6962 0.74  0.60 - 1.30 mg/dL Final    BUN/CREA RATIO 07/11/2022 11  6 - 22 Final    ESTIMATED GFR 07/11/2022 97  >59 mL/min/1.68m^2 Final    ALBUMIN 07/11/2022 4.0  3.5 - 5.7 g/dL Final    CALCIUM 95/28/4132 9.2  8.6 - 10.3 mg/dL Final    GLUCOSE 44/04/270 169 (H)  74 - 109 mg/dL Final    ALKALINE PHOSPHATASE 07/11/2022 58  34 - 104 U/L Final    ALT (SGPT) 07/11/2022 119 (H)  7 - 52 U/L Final    AST (SGOT) 07/11/2022 71 (H)  13 - 39 U/L Final    BILIRUBIN TOTAL 07/11/2022 1.0  0.3 - 1.2 mg/dL Final    PROTEIN TOTAL 07/11/2022 6.8  6.4 - 8.9 g/dL Final    ALBUMIN/GLOBULIN RATIO 07/11/2022 1.4  0.8 - 1.4 Final    OSMOLALITY, CALCULATED 07/11/2022 278  270 - 290 mOsm/kg Final    CALCIUM, CORRECTED 07/11/2022 9.2  8.9 - 10.8 mg/dL Final    GLOBULIN 53/66/4403 2.8 (L)  2.9 - 5.4 Final    WBC 07/11/2022 6.5  3.8 - 11.8 x10^3/uL Final    RBC 07/11/2022 5.73 (H)  3.63 - 4.92 x10^6/uL Final    HGB 07/11/2022 17.8 (H)  10.9 - 14.3 g/dL Final    HCT 47/42/5956 51.7 (H)  31.2 - 41.9 % Final    MCV 07/11/2022 90.3  75.5 - 95.3 fL Final    MCH 07/11/2022 31.0  24.7 - 32.8 pg Final    MCHC 07/11/2022 34.3  32.3 - 35.6 g/dL Final    RDW 38/75/6433 17.5  12.3 - 17.7 % Final    PLATELETS 07/11/2022 137 (L)  140 - 440 x10^3/uL Final    MPV 07/11/2022 9.0  7.9 - 10.8 fL Final    NEUTROPHIL % 07/11/2022 75  43 - 77 % Final    LYMPHOCYTE % 07/11/2022 17   16 - 44 % Final    MONOCYTE % 07/11/2022 7  5 - 13 % Final    EOSINOPHIL % 07/11/2022 2  % Final    BASOPHIL % 07/11/2022 1  0 - 1 % Final    NEUTROPHIL # 07/11/2022 4.90  1.85 - 7.80 x10^3/uL Final    LYMPHOCYTE # 07/11/2022 1.10  1.00 - 3.00 x10^3/uL Final    MONOCYTE # 07/11/2022 0.40  0.30 - 1.00 x10^3/uL Final    EOSINOPHIL # 07/11/2022 0.10  0.00 - 0.50 x10^3/uL Final    BASOPHIL # 07/11/2022 0.00  0.00 - 0.10 x10^3/uL Final   Appointment on 06/30/2022   Component Date Value Ref Range Status    SODIUM 06/30/2022 140  136 - 145 mmol/L Final    POTASSIUM 06/30/2022 4.1  3.5 - 5.1 mmol/L Final    CHLORIDE 06/30/2022 104  98 - 107 mmol/L Final    CO2 TOTAL 06/30/2022 30  21 - 31 mmol/L Final    ANION GAP 06/30/2022 6  4 - 13 mmol/L Final    BUN 06/30/2022 12  7 - 25 mg/dL Final    CREATININE 95/62/1308 0.72  0.60 - 1.30 mg/dL Final    BUN/CREA RATIO 06/30/2022 17  6 - 22 Final    ESTIMATED GFR 06/30/2022 100  >59 mL/min/1.75m^2 Final    ALBUMIN 06/30/2022 4.1  3.5 - 5.7 g/dL Final    CALCIUM 65/78/4696 9.2  8.6 - 10.3 mg/dL Final    GLUCOSE 29/52/8413 185 (H)  74 - 109 mg/dL Final    ALKALINE PHOSPHATASE 06/30/2022 58  34 - 104 U/L Final    ALT (SGPT) 06/30/2022 72 (H)  7 - 52 U/L Final    AST (SGOT) 06/30/2022 36  13 - 39 U/L Final    BILIRUBIN TOTAL 06/30/2022 1.0  0.3 - 1.2 mg/dL Final    PROTEIN TOTAL 06/30/2022 6.8  6.4 - 8.9 g/dL Final    ALBUMIN/GLOBULIN RATIO 06/30/2022 1.5 (H)  0.8 - 1.4 Final    OSMOLALITY, CALCULATED 06/30/2022 284  270 - 290 mOsm/kg Final    CALCIUM, CORRECTED 06/30/2022 9.1  8.9 - 10.8 mg/dL Final    GLOBULIN 24/40/1027 2.7 (L)  2.9 - 5.4 Final    ERYTHROPOIETIN (EPO) 06/30/2022 5.3  2.6 - 18.5 mIU/mL Final    D-DIMER 06/30/2022 459  215 - 500 ng/mL FEU Final    WBC 06/30/2022 10.1  3.8 - 11.8 x10^3/uL Final    RBC 06/30/2022 6.09 (H)  3.63 - 4.92 x10^6/uL Final    HGB 06/30/2022 18.7 (H)  10.9 - 14.3 g/dL Final    HCT 25/36/6440 55.1 (H)  31.2 - 41.9 % Final    MCV 06/30/2022 90.5   75.5 - 95.3 fL Final    MCH 06/30/2022 30.7  24.7 - 32.8 pg Final    MCHC 06/30/2022 33.9  32.3 - 35.6 g/dL Final    RDW 34/74/2595 17.7  12.3 - 17.7 % Final    PLATELETS 06/30/2022 168  140 - 440 x10^3/uL Final    MPV 06/30/2022 8.9  7.9 - 10.8 fL Final    NEUTROPHIL % 06/30/2022 86 (H)  43 - 77 % Final    LYMPHOCYTE % 06/30/2022 10 (L)  16 - 44 % Final    MONOCYTE % 06/30/2022 4 (L)  5 - 13 % Final    EOSINOPHIL % 06/30/2022 0 (L)  % Final    BASOPHIL % 06/30/2022 0  0 - 1 % Final    NEUTROPHIL # 06/30/2022 8.70 (H)  1.85 - 7.80 x10^3/uL Final    LYMPHOCYTE # 06/30/2022 1.00  1.00 - 3.00 x10^3/uL Final    MONOCYTE # 06/30/2022 0.40  0.30 - 1.00 x10^3/uL Final    EOSINOPHIL # 06/30/2022 0.00  0.00 - 0.50 x10^3/uL Final    BASOPHIL # 06/30/2022 0.00  0.00 - 0.10 x10^3/uL Final   Admission on 06/23/2022, Discharged on 06/23/2022   Component Date Value Ref Range Status    SODIUM 06/23/2022 141  136 - 145 mmol/L Final    POTASSIUM 06/23/2022 4.3  3.5 - 5.1 mmol/L Final    CHLORIDE 06/23/2022 104  98 - 107 mmol/L Final    CO2 TOTAL 06/23/2022 25  21 - 32 mmol/L Final    ANION GAP 06/23/2022 12  4 - 13 mmol/L Final    CALCIUM 06/23/2022 9.3  8.5 - 10.1 mg/dL Final    GLUCOSE 63/87/5643 142 (H)  74 -  106 mg/dL Final    BUN 16/01/9603 15  7 - 18 mg/dL Final    CREATININE 54/12/8117 0.80  0.55 - 1.02 mg/dL Final    BUN/CREA RATIO 06/23/2022 19   Final    ESTIMATED GFR 06/23/2022 88  >59 mL/min/1.29m^2 Final    OSMOLALITY, CALCULATED 06/23/2022 285  270 - 290 mOsm/kg Final    TROPONIN I 06/23/2022 6  <15 ng/L Final    Ventricular rate 06/23/2022 79  BPM Final    Atrial Rate 06/23/2022 79  BPM Final    PR Interval 06/23/2022 140  ms Final    QRS Duration 06/23/2022 104  ms Final    QT Interval 06/23/2022 386  ms Final    QTC Calculation 06/23/2022 442  ms Final    Calculated P Axis 06/23/2022 57  degrees Final    Calculated R Axis 06/23/2022 103  degrees Final    Calculated T Axis 06/23/2022 42  degrees Final    WBC  06/23/2022 10.0  4.0 - 10.5 x10^3/uL Final    RBC 06/23/2022 6.28 (H)  4.20 - 5.40 x10^6/uL Final    HGB 06/23/2022 19.1 (H)  12.5 - 16.0 g/dL Final    HCT 14/78/2956 57.3 (H)  37.0 - 47.0 % Final    MCV 06/23/2022 91.3  78.0 - 99.0 fL Final    MCH 06/23/2022 30.4  27.0 - 32.0 pg Final    MCHC 06/23/2022 33.3  32.0 - 36.0 g/dL Final    RDW 21/30/8657 18.0 (H)  11.6 - 14.8 % Final    PLATELETS 06/23/2022 211  140 - 440 x10^3/uL Final    MPV 06/23/2022 8.8  7.4 - 10.4 fL Final    NEUTROPHIL % 06/23/2022 64  40 - 76 % Final    LYMPHOCYTE % 06/23/2022 28  25 - 45 % Final    MONOCYTE % 06/23/2022 6  0 - 12 % Final    EOSINOPHIL % 06/23/2022 2  0 - 7 % Final    BASOPHIL % 06/23/2022 0  0 - 3 % Final    NEUTROPHIL # 06/23/2022 6.43  1.80 - 8.40 x10^3/uL Final    LYMPHOCYTE # 06/23/2022 2.77  1.10 - 5.00 x10^3/uL Final    MONOCYTE # 06/23/2022 0.61  0.00 - 1.30 x10^3/uL Final    EOSINOPHIL # 06/23/2022 0.16  0.00 - 0.80 x10^3/uL Final    BASOPHIL # 06/23/2022 0.01  0.00 - 0.30 x10^3/uL Final    TROPONIN I 06/23/2022 8  <15 ng/L Final   Hospital Outpatient Visit on 06/13/2022   Component Date Value Ref Range Status    SODIUM 06/13/2022 140  136 - 145 mmol/L Final    POTASSIUM 06/13/2022 3.8  3.5 - 5.1 mmol/L Final    CHLORIDE 06/13/2022 103  98 - 107 mmol/L Final    CO2 TOTAL 06/13/2022 31  21 - 31 mmol/L Final    ANION GAP 06/13/2022 6  4 - 13 mmol/L Final    BUN 06/13/2022 12  7 - 25 mg/dL Final    CREATININE 84/69/6295 0.64  0.60 - 1.30 mg/dL Final    BUN/CREA RATIO 06/13/2022 19  6 - 22 Final    ESTIMATED GFR 06/13/2022 106  >59 mL/min/1.64m^2 Final    ALBUMIN 06/13/2022 4.0  3.5 - 5.7 g/dL Final    CALCIUM 28/41/3244 9.4  8.6 - 10.3 mg/dL Final    GLUCOSE 04/23/7251 70 (L)  74 - 109 mg/dL Final    ALKALINE PHOSPHATASE 06/13/2022 67  34 - 104 U/L Final    ALT (SGPT) 06/13/2022 117 (H)  7 - 52 U/L Final    AST (SGOT) 06/13/2022 58 (H)  13 - 39 U/L Final    BILIRUBIN TOTAL 06/13/2022 0.8  0.3 - 1.2 mg/dL Final    PROTEIN  TOTAL 06/13/2022 7.3  6.4 - 8.9 g/dL Final    ALBUMIN/GLOBULIN RATIO 06/13/2022 1.2  0.8 - 1.4 Final    OSMOLALITY, CALCULATED 06/13/2022 278  270 - 290 mOsm/kg Final    CALCIUM, CORRECTED 06/13/2022 9.4  8.9 - 10.8 mg/dL Final    GLOBULIN 47/82/9562 3.3  2.9 - 5.4 Final    WBC 06/13/2022 11.3  3.8 - 11.8 x10^3/uL Final    RBC 06/13/2022 6.06 (H)  3.63 - 4.92 x10^6/uL Final    HGB 06/13/2022 18.1 (H)  10.9 - 14.3 g/dL Final    HCT 13/11/6576 54.1 (H)  31.2 - 41.9 % Final    MCV 06/13/2022 89.1  75.5 - 95.3 fL Final    MCH 06/13/2022 29.8  24.7 - 32.8 pg Final    MCHC 06/13/2022 33.4  32.3 - 35.6 g/dL Final    RDW 46/96/2952 17.7  12.3 - 17.7 % Final    PLATELETS 06/13/2022 173  140 - 440 x10^3/uL Final    MPV 06/13/2022 8.9  7.9 - 10.8 fL Final    NEUTROPHIL % 06/13/2022 63  43 - 77 % Final    LYMPHOCYTE % 06/13/2022 27  16 - 44 % Final    MONOCYTE % 06/13/2022 8  5 - 13 % Final    EOSINOPHIL % 06/13/2022 1  % Final    BASOPHIL % 06/13/2022 1  0 - 1 % Final    NEUTROPHIL # 06/13/2022 7.10  1.85 - 7.80 x10^3/uL Final    LYMPHOCYTE # 06/13/2022 3.10 (H)  1.00 - 3.00 x10^3/uL Final    MONOCYTE # 06/13/2022 0.90  0.30 - 1.00 x10^3/uL Final    EOSINOPHIL # 06/13/2022 0.10  0.00 - 0.50 x10^3/uL Final    BASOPHIL # 06/13/2022 0.10  0.00 - 0.10 x10^3/uL Final   Appointment on 05/28/2022   Component Date Value Ref Range Status    TSH 05/28/2022 5.079  0.450 - 5.330 uIU/mL Final    SODIUM 05/28/2022 140  136 - 145 mmol/L Final    POTASSIUM 05/28/2022 4.6  3.5 - 5.1 mmol/L Final    CHLORIDE 05/28/2022 102  98 - 107 mmol/L Final    CO2 TOTAL 05/28/2022 33 (H)  21 - 31 mmol/L Final    ANION GAP 05/28/2022 5  4 - 13 mmol/L Final    BUN 05/28/2022 10  7 - 25 mg/dL Final    CREATININE 84/13/2440 0.67  0.60 - 1.30 mg/dL Final    BUN/CREA RATIO 05/28/2022 15  6 - 22 Final    ESTIMATED GFR 05/28/2022 105  >59 mL/min/1.63m^2 Final    ALBUMIN 05/28/2022 4.0  3.5 - 5.7 g/dL Final    CALCIUM 02/15/2535 9.9  8.6 - 10.3 mg/dL Final     GLUCOSE 64/40/3474 118 (H)  74 - 109 mg/dL Final    ALKALINE PHOSPHATASE 05/28/2022 86  34 - 104 U/L Final    ALT (SGPT) 05/28/2022 388 (H)  7 - 52 U/L Final    AST (SGOT) 05/28/2022 271 (H)  13 - 39 U/L Final    BILIRUBIN TOTAL 05/28/2022 1.5 (H)  0.3 - 1.2 mg/dL Final    PROTEIN TOTAL 05/28/2022 7.5  6.4 - 8.9 g/dL  Final    ALBUMIN/GLOBULIN RATIO 05/28/2022 1.1  0.8 - 1.4 Final    OSMOLALITY, CALCULATED 05/28/2022 280  270 - 290 mOsm/kg Final    CALCIUM, CORRECTED 05/28/2022 9.9  8.9 - 10.8 mg/dL Final    GLOBULIN 16/01/9603 3.5  2.9 - 5.4 Final   Telemedicine on 05/26/2022   Component Date Value Ref Range Status    REPORT SUMMARY 05/26/2022 NEGATIVE   Final    Negative for 81 out of 81 genes.  No known pathogenic or likely pathogenic variants were detected in the 81 genes analyzed. A Tyrer-Cuzick breast cancer risk assessment was performed and a lifetime breast cancer risk was calculated to be  > = 20%    FOOTNOTES 05/26/2022 See Notes   Final    CLIA: ID #[id number]  Test performed by [facility]. [id number] [place], [place] [address]   J. Luana Shu, Ph.D., [facility], Laboratory Director   Appointment on 05/23/2022   Component Date Value Ref Range Status    SODIUM 05/23/2022 140  136 - 145 mmol/L Final    POTASSIUM 05/23/2022 4.2  3.5 - 5.1 mmol/L Final    CHLORIDE 05/23/2022 103  98 - 107 mmol/L Final    CO2 TOTAL 05/23/2022 30  21 - 31 mmol/L Final    ANION GAP 05/23/2022 7  4 - 13 mmol/L Final    BUN 05/23/2022 11  7 - 25 mg/dL Final    CREATININE 62/13/0865 0.74  0.60 - 1.30 mg/dL Final    BUN/CREA RATIO 05/23/2022 15  6 - 22 Final    ESTIMATED GFR 05/23/2022 97  >59 mL/min/1.75m^2 Final    ALBUMIN 05/23/2022 4.0  3.5 - 5.7 g/dL Final    CALCIUM 78/46/9629 9.8  8.6 - 10.3 mg/dL Final    GLUCOSE 52/84/1324 162 (H)  74 - 109 mg/dL Final    ALKALINE PHOSPHATASE 05/23/2022 84  34 - 104 U/L Final    ALT (SGPT) 05/23/2022 342 (H)  7 - 52 U/L Final    AST (SGOT) 05/23/2022 232 (H)  13 - 39 U/L Final     BILIRUBIN TOTAL 05/23/2022 1.1  0.3 - 1.2 mg/dL Final    PROTEIN TOTAL 05/23/2022 7.1  6.4 - 8.9 g/dL Final    ALBUMIN/GLOBULIN RATIO 05/23/2022 1.3  0.8 - 1.4 Final    OSMOLALITY, CALCULATED 05/23/2022 282  270 - 290 mOsm/kg Final    CALCIUM, CORRECTED 05/23/2022 9.8  8.9 - 10.8 mg/dL Final    GLOBULIN 40/01/2724 3.1  2.9 - 5.4 Final    WBC 05/23/2022 6.9  3.8 - 11.8 x10^3/uL Final    RBC 05/23/2022 5.91 (H)  3.63 - 4.92 x10^6/uL Final    HGB 05/23/2022 17.3 (H)  10.9 - 14.3 g/dL Final    HCT 36/64/4034 52.4 (H)  31.2 - 41.9 % Final    MCV 05/23/2022 88.7  75.5 - 95.3 fL Final    MCH 05/23/2022 29.3  24.7 - 32.8 pg Final    MCHC 05/23/2022 33.0  32.3 - 35.6 g/dL Final    RDW 74/25/9563 17.9 (H)  12.3 - 17.7 % Final    PLATELETS 05/23/2022 138 (L)  140 - 440 x10^3/uL Final    MPV 05/23/2022 8.3  7.9 - 10.8 fL Final    NEUTROPHIL % 05/23/2022 71  43 - 77 % Final    LYMPHOCYTE % 05/23/2022 20  16 - 44 % Final    MONOCYTE % 05/23/2022 8  5 - 13 % Final  EOSINOPHIL % 05/23/2022 2  % Final    BASOPHIL % 05/23/2022 1  0 - 1 % Final    NEUTROPHIL # 05/23/2022 4.90  1.85 - 7.80 x10^3/uL Final    LYMPHOCYTE # 05/23/2022 1.40  1.00 - 3.00 x10^3/uL Final    MONOCYTE # 05/23/2022 0.50  0.30 - 1.00 x10^3/uL Final    EOSINOPHIL # 05/23/2022 0.10  0.00 - 0.50 x10^3/uL Final    BASOPHIL # 05/23/2022 0.00  0.00 - 0.10 x10^3/uL Final   Hospital Outpatient Visit on 05/16/2022   Component Date Value Ref Range Status    SODIUM 05/16/2022 141  136 - 145 mmol/L Final    POTASSIUM 05/16/2022 3.5  3.5 - 5.1 mmol/L Final    CHLORIDE 05/16/2022 103  98 - 107 mmol/L Final    CO2 TOTAL 05/16/2022 31  21 - 31 mmol/L Final    ANION GAP 05/16/2022 7  4 - 13 mmol/L Final    BUN 05/16/2022 6 (L)  7 - 25 mg/dL Final    CREATININE 45/40/9811 0.66  0.60 - 1.30 mg/dL Final    BUN/CREA RATIO 05/16/2022 9  6 - 22 Final    ESTIMATED GFR 05/16/2022 105  >59 mL/min/1.67m^2 Final    ALBUMIN 05/16/2022 3.9  3.5 - 5.7 g/dL Final    CALCIUM 91/47/8295 9.3   8.6 - 10.3 mg/dL Final    GLUCOSE 62/13/0865 115 (H)  74 - 109 mg/dL Final    ALKALINE PHOSPHATASE 05/16/2022 91  34 - 104 U/L Final    ALT (SGPT) 05/16/2022 243 (H)  7 - 52 U/L Final    AST (SGOT) 05/16/2022 135 (H)  13 - 39 U/L Final    BILIRUBIN TOTAL 05/16/2022 0.9  0.3 - 1.2 mg/dL Final    PROTEIN TOTAL 05/16/2022 7.0  6.4 - 8.9 g/dL Final    ALBUMIN/GLOBULIN RATIO 05/16/2022 1.3  0.8 - 1.4 Final    OSMOLALITY, CALCULATED 05/16/2022 280  270 - 290 mOsm/kg Final    CALCIUM, CORRECTED 05/16/2022 9.4  8.9 - 10.8 mg/dL Final    GLOBULIN 78/46/9629 3.1  2.9 - 5.4 Final    WBC 05/16/2022 5.4  3.8 - 11.8 x10^3/uL Final    RBC 05/16/2022 5.95 (H)  3.63 - 4.92 x10^6/uL Final    HGB 05/16/2022 17.4 (H)  10.9 - 14.3 g/dL Final    HCT 52/84/1324 52.5 (H)  31.2 - 41.9 % Final    MCV 05/16/2022 88.3  75.5 - 95.3 fL Final    MCH 05/16/2022 29.2  24.7 - 32.8 pg Final    MCHC 05/16/2022 33.1  32.3 - 35.6 g/dL Final    RDW 40/01/2724 17.3  12.3 - 17.7 % Final    PLATELETS 05/16/2022 143  140 - 440 x10^3/uL Final    MPV 05/16/2022 8.5  7.9 - 10.8 fL Final    NEUTROPHIL % 05/16/2022 57  43 - 77 % Final    LYMPHOCYTE % 05/16/2022 30  16 - 44 % Final    MONOCYTE % 05/16/2022 10  5 - 13 % Final    EOSINOPHIL % 05/16/2022 2  % Final    BASOPHIL % 05/16/2022 1  0 - 1 % Final    NEUTROPHIL # 05/16/2022 3.10  1.85 - 7.80 x10^3/uL Final    LYMPHOCYTE # 05/16/2022 1.60  1.00 - 3.00 x10^3/uL Final    MONOCYTE # 05/16/2022 0.60  0.30 - 1.00 x10^3/uL Final    EOSINOPHIL # 05/16/2022 0.10  0.00 - 0.50 x10^3/uL  Final    BASOPHIL # 05/16/2022 0.00  0.00 - 0.10 x10^3/uL Final   Hospital Outpatient Visit on 04/18/2022   Component Date Value Ref Range Status    SODIUM 04/18/2022 136  136 - 145 mmol/L Final    POTASSIUM 04/18/2022 3.9  3.5 - 5.1 mmol/L Final    CHLORIDE 04/18/2022 102  98 - 107 mmol/L Final    CO2 TOTAL 04/18/2022 27  21 - 31 mmol/L Final    ANION GAP 04/18/2022 7  4 - 13 mmol/L Final    BUN 04/18/2022 9  7 - 25 mg/dL Final     CREATININE 13/11/6576 0.63  0.60 - 1.30 mg/dL Final    BUN/CREA RATIO 04/18/2022 14  6 - 22 Final    ESTIMATED GFR 04/18/2022 107  >59 mL/min/1.92m^2 Final    ALBUMIN 04/18/2022 3.7  3.5 - 5.7 g/dL Final    CALCIUM 46/96/2952 9.2  8.6 - 10.3 mg/dL Final    GLUCOSE 84/13/2440 134 (H)  74 - 109 mg/dL Final    ALKALINE PHOSPHATASE 04/18/2022 170 (H)  34 - 104 U/L Final    ALT (SGPT) 04/18/2022 438 (H)  7 - 52 U/L Final    AST (SGOT) 04/18/2022 264 (H)  13 - 39 U/L Final    BILIRUBIN TOTAL 04/18/2022 3.3 (H)  0.3 - 1.2 mg/dL Final    PROTEIN TOTAL 04/18/2022 7.3  6.4 - 8.9 g/dL Final    ALBUMIN/GLOBULIN RATIO 04/18/2022 1.0  0.8 - 1.4 Final    OSMOLALITY, CALCULATED 04/18/2022 273  270 - 290 mOsm/kg Final    CALCIUM, CORRECTED 04/18/2022 9.4  8.9 - 10.8 mg/dL Final    GLOBULIN 02/15/2535 3.6  2.9 - 5.4 Final    WBC 04/18/2022 6.5  3.8 - 11.8 x10^3/uL Final    RBC 04/18/2022 5.96 (H)  3.63 - 4.92 x10^6/uL Final    HGB 04/18/2022 17.0 (H)  10.9 - 14.3 g/dL Final    HCT 64/40/3474 52.2 (H)  31.2 - 41.9 % Final    MCV 04/18/2022 87.5  75.5 - 95.3 fL Final    MCH 04/18/2022 28.6  24.7 - 32.8 pg Final    MCHC 04/18/2022 32.6  32.3 - 35.6 g/dL Final    RDW 25/95/6387 17.0  12.3 - 17.7 % Final    PLATELETS 04/18/2022 119 (L)  140 - 440 x10^3/uL Final    MPV 04/18/2022 9.6  7.9 - 10.8 fL Final    NEUTROPHIL % 04/18/2022 84 (H)  43 - 77 % Final    LYMPHOCYTE % 04/18/2022 7 (L)  16 - 44 % Final    MONOCYTE % 04/18/2022 7  5 - 13 % Final    EOSINOPHIL % 04/18/2022 2  % Final    BASOPHIL % 04/18/2022 0  0 - 1 % Final    NEUTROPHIL # 04/18/2022 5.50  1.85 - 7.80 x10^3/uL Final    LYMPHOCYTE # 04/18/2022 0.40 (L)  1.00 - 3.00 x10^3/uL Final    MONOCYTE # 04/18/2022 0.40  0.30 - 1.00 x10^3/uL Final    EOSINOPHIL # 04/18/2022 0.10  0.00 - 0.50 x10^3/uL Final    BASOPHIL # 04/18/2022 0.00  0.00 - 0.10 x10^3/uL Final   There may be more visits with results that are not included.        Health Maintenance:  Health Maintenance   Topic Date  Due    Hepatitis C screening  Never done    Pneumococcal Vaccine, Age 60-64 (1 of 2 - PCV)  Never done    HIV Screening  Never done    Adult Tdap-Td (1 - Tdap) Never done    Hepatitis B Vaccine (1 of 3 - 19+ 3-dose series) Never done    Shingles Vaccine (1 of 2) Never done    Pap smear  Never done    Colonoscopy  Never done    Mammography  Never done    Covid-19 Vaccine (4 - 2023-24 season) 12/20/2021    Depression Screening  11/08/2022    NonMedicare Preventative Exam  06/26/2023    Influenza Vaccine  Completed    Meningococcal Vaccine  Aged Out        Assessment & Plan   Problem List Items Addressed This Visit          Cardiovascular System    Primary hypertension - Primary (Chronic)     Clonidine patch added to current blood pressure regimen. Continue with lisinopril and HCTZ.  Patient to continue checking blood pressure at home and keep log. Educated patient on warning s/s including chest pain/pressure, severe headache, vision disturbance, altered mental status and if these occur to go to ER.   Will reevaluate in appx 2 weeks.          Relevant Medications    cloNIDine (CATAPRES-TTS) 0.3 mg/24 hr Transdermal Patch Weekly               Follow up:  Return in about 2 weeks (around 09/11/2022), or if symptoms worsen or fail to improve.    This note was partially created using voice recognition software and is inherently subject to errors including those of syntax and "sound alike " substitutions which may escape proof reading.  In such instances, original meaning may be extrapolated by contextual derivation.    Macario Golds, FNP-C  08/28/2022, 11:07

## 2022-08-28 NOTE — Assessment & Plan Note (Signed)
Clonidine patch added to current blood pressure regimen. Continue with lisinopril and HCTZ.  Patient to continue checking blood pressure at home and keep log. Educated patient on warning s/s including chest pain/pressure, severe headache, vision disturbance, altered mental status and if these occur to go to ER.   Will reevaluate in appx 2 weeks.

## 2022-08-29 ENCOUNTER — Encounter (INDEPENDENT_AMBULATORY_CARE_PROVIDER_SITE_OTHER): Payer: Self-pay | Admitting: NURSE PRACTITIONER

## 2022-08-29 ENCOUNTER — Other Ambulatory Visit (HOSPITAL_COMMUNITY): Payer: Self-pay | Admitting: HEMATOLOGY-ONCOLOGY

## 2022-08-29 DIAGNOSIS — I10 Essential (primary) hypertension: Secondary | ICD-10-CM

## 2022-08-29 MED ORDER — VALSARTAN 40 MG TABLET
40.0000 mg | ORAL_TABLET | Freq: Two times a day (BID) | ORAL | 1 refills | Status: DC
Start: 2022-08-29 — End: 2022-09-25

## 2022-08-29 NOTE — Telephone Encounter (Signed)
It may just be a local reaction. We can stop the patch. We will try something else. Let's stop the lisinopril.   I have sent in a new prescription for valsartan 40 mg twice daily and then, continue to monitor blood pressure and keep scheduled follow up in 2 weeks.

## 2022-08-29 NOTE — Telephone Encounter (Signed)
From: Taylor Smith  To: Taylor Smith  Sent: 08/29/2022 7:02 AM EDT  Subject: Clonidine Patch    Good morning,   I put on the patch lastnight and it caused my arm to ache pretty bad. I had it on for about an hour. I ended up taking it off and my arm still aches some. It was my right arm. Is this normal? I wasn't sure if maybe it was too strong or if maybe it was just me.    Thank you,  Taylor Smith

## 2022-09-02 ENCOUNTER — Encounter (INDEPENDENT_AMBULATORY_CARE_PROVIDER_SITE_OTHER): Payer: Self-pay | Admitting: NURSE PRACTITIONER

## 2022-09-02 ENCOUNTER — Encounter (HOSPITAL_COMMUNITY): Payer: Self-pay | Admitting: HEMATOLOGY-ONCOLOGY

## 2022-09-02 DIAGNOSIS — M62838 Other muscle spasm: Secondary | ICD-10-CM

## 2022-09-02 MED ORDER — TIZANIDINE 2 MG TABLET
2.0000 mg | ORAL_TABLET | Freq: Three times a day (TID) | ORAL | 0 refills | Status: DC
Start: 2022-09-02 — End: 2022-09-29

## 2022-09-02 NOTE — Telephone Encounter (Signed)
From: Theodis Blaze  To: Macario Golds  Sent: 09/02/2022 1:27 PM EDT  Subject: Back Issues    Good afternoon,  I've been having muscle like spasms on the left side of my back. This started on Friday evening, I had been sitting on a metal stool and I guess I was sitting odd, not sure. I've been trying muscle creme and Tylenol for it but it's not really getting any better. It will spasm randomly and at times it's hard to move. I wasn't sure to contact you or my primary doctor. 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      Thank you, Taylor Smith   Showing 1 of 1    Please allow up to 48 hours for a reply.

## 2022-09-02 NOTE — Telephone Encounter (Signed)
Please let her know I have sent her in script for mild muscle relaxer to try, she can also try moist heat to area several times daily.

## 2022-09-12 ENCOUNTER — Ambulatory Visit (INDEPENDENT_AMBULATORY_CARE_PROVIDER_SITE_OTHER): Payer: 59 | Admitting: NURSE PRACTITIONER

## 2022-09-12 ENCOUNTER — Other Ambulatory Visit: Payer: Self-pay

## 2022-09-12 ENCOUNTER — Encounter (INDEPENDENT_AMBULATORY_CARE_PROVIDER_SITE_OTHER): Payer: Self-pay | Admitting: NURSE PRACTITIONER

## 2022-09-12 VITALS — BP 160/100 | HR 68 | Ht 69.02 in | Wt 223.0 lb

## 2022-09-12 DIAGNOSIS — I10 Essential (primary) hypertension: Secondary | ICD-10-CM

## 2022-09-12 NOTE — Assessment & Plan Note (Signed)
Patient's valsartan increased to 80 mg twice daily, patient to continue to monitor blood pressure at home and  continue with heart healthy diet.   Will reevaluate in appx 2 weeks.

## 2022-09-12 NOTE — Progress Notes (Signed)
[facility], [facility]  [address]  [place] [address]       Name: Taylor Smith MRN:  [id number]   Date: 09/12/2022 Age: 52 y.o.       Chief Complaint:    Chief Complaint   Patient presents with    Follow Up     2 week f/u.        HPI:  Taylor Smith is a 52 y.o. female who is here today for f/u up on blood pressure, she was unable to tolerate clonidine patch so she was switched to valsartan. She is tolerating the valsartan well and she states that her headaches have improved. She informs that for the most part at home her blood pressures are ranging the the 140's-150's with diastolic's in the mid 90's., and while headaches have improved she is still having the ocassional headache. She denies any chest pain/pressure, or vision disturbance.         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 to other site, left (CMS [facility])          Past Surgical History:   Procedure Laterality Date    HX CESAREAN SECTION      HX CHOLECYSTECTOMY      HX CYST REMOVAL Right     HX HYSTERECTOMY      TOTAL THYROIDECTOMY        Current Outpatient Medications   Medication Sig    apixaban (ELIQUIS) 5 mg Oral Tablet Take 1 Tablet (5 mg total) by mouth Twice daily    axitinib (INLYTA) 5 mg Oral Tablet Take 1 Tablet (5 mg total) by mouth Twice daily    ergocalciferol, vitamin D2, (DRISDOL) 1,250 mcg (50,000 unit) Oral Capsule Take 1 Capsule (50,000 Units total) by mouth Every 7 days    hydroCHLOROthiazide (MICROZIDE) 12.5 mg Oral Capsule Take 1 Capsule (12.5 mg total) by mouth Once a day Indications: high blood pressure    HYDROcodone-acetaminophen (NORCO) 5-325 mg Oral Tablet Take 2 Tablets by mouth Every 4 hours as needed for Pain (Patient not taking: Reported on 08/28/2022)     Ibuprofen (MOTRIN) 600 mg Oral Tablet Take 1 Tablet (600 mg total) by mouth Every 6 hours as needed    levothyroxine (SYNTHROID) 125 mcg Oral Tablet Take 1 Tablet (125 mcg total) by mouth Every morning    omeprazole (PRILOSEC) 40 mg Oral Capsule, Delayed Release(E.C.) Take 1 Capsule (40 mg total) by mouth Once a day    predniSONE (DELTASONE) 10 mg Oral Tablet PLEASE SEE ATTACHED FOR DETAILED DIRECTIONS    tiZANidine (ZANAFLEX) 2 mg Oral Tablet Take 1 Tablet (2 mg total) by mouth Three times a day Indications: muscle spasm    valsartan (DIOVAN) 40 mg Oral Tablet Take 1 Tablet (40 mg total) by mouth Twice daily Indications: high blood pressure     No Known Allergies    Family History:  Family Medical History:       Problem Relation (Age of Onset)    Cancer Maternal Aunt, Maternal Grandmother    Colon Cancer Maternal Uncle    Lung Cancer Mother    Melanoma Maternal Aunt              Social  History:   Social History     Tobacco Use   Smoking Status Never   Smokeless Tobacco Never     Social History     Substance and Sexual Activity   Alcohol Use None     Social History     Occupational History    Not on file       Review of Systems:  Review of systems as discussed in HPI    Problem List:  Patient Active Problem List   Diagnosis    Vitamin D deficiency    Hypothyroid    GERD (gastroesophageal reflux disease)    Primary hypertension    Neural foraminal stenosis of cervical spine    Osteophyte of cervical spine    S/P hysterectomy    Kidney cancer, primary, with metastasis from kidney to other site, left (CMS [facility])       Physical Examination:  BP (!) 160/100 (Site: Left, Patient Position: Sitting, Cuff Size: Adult Large)   Pulse 68   Ht 1.753 m (5' 9.02")   Wt 101 kg (223 lb)   SpO2 95%   BMI 32.92 kg/m       Physical Exam  Vitals and nursing note reviewed.   Constitutional:       Appearance: Normal appearance. She is well-developed and well-groomed.   HENT:      Head: Normocephalic and atraumatic.   Eyes:       Extraocular Movements: Extraocular movements intact.   Cardiovascular:      Rate and Rhythm: Normal rate.      Pulses: Normal pulses.      Heart sounds: Normal heart sounds, S1 normal and S2 normal.   Pulmonary:      Effort: Pulmonary effort is normal.      Breath sounds: Normal breath sounds.   Abdominal:      General: Abdomen is flat. Bowel sounds are normal.      Palpations: Abdomen is soft.   Skin:     General: Skin is warm and dry.      Capillary Refill: Capillary refill takes less than 2 seconds.   Neurological:      General: No focal deficit present.      Mental Status: She is alert and oriented to person, place, and time.        Data Reviewed:  Admission on 08/25/2022, Discharged on 08/26/2022   Component Date Value Ref Range Status    SODIUM 08/25/2022 141  136 - 145 mmol/L Final    POTASSIUM 08/25/2022 3.6  3.5 - 5.1 mmol/L Final    CHLORIDE 08/25/2022 103  98 - 107 mmol/L Final    CO2 TOTAL 08/25/2022 29  21 - 32 mmol/L Final    ANION GAP 08/25/2022 9  4 - 13 mmol/L Final    BUN 08/25/2022 7  7 - 18 mg/dL Final    CREATININE 81/19/1478 0.67  0.55 - 1.02 mg/dL Final    BUN/CREA RATIO 08/25/2022 10   Final    ESTIMATED GFR 08/25/2022 104  >59 mL/min/1.73m^2 Final    ALBUMIN 08/25/2022 3.8  3.4 - 5.0 g/dL Final    Previously suppressed result.    CALCIUM 08/25/2022 8.9  8.5 - 10.1 mg/dL Final    GLUCOSE 29/56/2130 101  74 - 106 mg/dL Final    ALKALINE PHOSPHATASE 08/25/2022 86  46 - 116 U/L Final    Previously suppressed result.    ALT (SGPT) 08/25/2022 49  <=78 U/L Final    Previously  suppressed result.    AST (SGOT) 08/25/2022 43 (H)  15 - 37 U/L Final    Previously suppressed result.    BILIRUBIN TOTAL 08/25/2022 0.8  0.2 - 1.0 mg/dL Final    Previously suppressed result.    PROTEIN TOTAL 08/25/2022 7.8  6.4 - 8.2 g/dL Final    Previously suppressed result.    ALBUMIN/GLOBULIN RATIO 08/25/2022 1.0  0.8 - 1.4 Final    Comment: Calculation not performed.                               Previously suppressed  result.    OSMOLALITY, CALCULATED 08/25/2022 279  270 - 290 mOsm/kg Final    CALCIUM, CORRECTED 08/25/2022 9.1  mg/dL Final    Previously suppressed result.    GLOBULIN 08/25/2022 4.0   Final    Comment: Calculation not performed.    Calculation not performed.                               Previously suppressed result.    WBC 08/25/2022 5.4  4.0 - 10.5 x10^3/uL Final    RBC 08/25/2022 5.27  4.20 - 5.40 x10^6/uL Final    HGB 08/25/2022 17.2 (H)  12.5 - 16.0 g/dL Final    HCT 84/13/2440 50.9 (H)  37.0 - 47.0 % Final    MCV 08/25/2022 96.5  78.0 - 99.0 fL Final    MCH 08/25/2022 32.6 (H)  27.0 - 32.0 pg Final    MCHC 08/25/2022 33.7  32.0 - 36.0 g/dL Final    RDW 02/15/2535 15.9 (H)  11.6 - 14.8 % Final    PLATELETS 08/25/2022 169  140 - 440 x10^3/uL Final    MPV 08/25/2022 9.1  7.4 - 10.4 fL Final    NEUTROPHIL % 08/25/2022 55  40 - 76 % Final    LYMPHOCYTE % 08/25/2022 32  25 - 45 % Final    MONOCYTE % 08/25/2022 9  0 - 12 % Final    EOSINOPHIL % 08/25/2022 4  0 - 7 % Final    BASOPHIL % 08/25/2022 0  0 - 3 % Final    NEUTROPHIL # 08/25/2022 2.97  1.80 - 8.40 x10^3/uL Final    LYMPHOCYTE # 08/25/2022 1.71  1.10 - 5.00 x10^3/uL Final    MONOCYTE # 08/25/2022 0.46  0.00 - 1.30 x10^3/uL Final    EOSINOPHIL # 08/25/2022 0.24  0.00 - 0.80 x10^3/uL Final    BASOPHIL # 08/25/2022 0.00  0.00 - 0.30 x10^3/uL Final    RAINBOW/EXTRA TUBE AUTO RESULT 08/25/2022 Yes   Final    RAINBOW/EXTRA TUBE AUTO RESULT 08/25/2022 Yes   Final   Hospital Outpatient Visit on 07/11/2022   Component Date Value Ref Range Status    SODIUM 07/11/2022 138  136 - 145 mmol/L Final    POTASSIUM 07/11/2022 3.8  3.5 - 5.1 mmol/L Final    CHLORIDE 07/11/2022 102  98 - 107 mmol/L Final    CO2 TOTAL 07/11/2022 29  21 - 31 mmol/L Final    ANION GAP 07/11/2022 7  4 - 13 mmol/L Final    BUN 07/11/2022 8  7 - 25 mg/dL Final    CREATININE 64/40/3474 0.74  0.60 - 1.30 mg/dL Final    BUN/CREA RATIO 07/11/2022 11  6 - 22 Final    ESTIMATED GFR 07/11/2022 97  >59  mL/min/1.69m^2 Final  ALBUMIN 07/11/2022 4.0  3.5 - 5.7 g/dL Final    CALCIUM 16/01/9603 9.2  8.6 - 10.3 mg/dL Final    GLUCOSE 54/12/8117 169 (H)  74 - 109 mg/dL Final    ALKALINE PHOSPHATASE 07/11/2022 58  34 - 104 U/L Final    ALT (SGPT) 07/11/2022 119 (H)  7 - 52 U/L Final    AST (SGOT) 07/11/2022 71 (H)  13 - 39 U/L Final    BILIRUBIN TOTAL 07/11/2022 1.0  0.3 - 1.2 mg/dL Final    PROTEIN TOTAL 07/11/2022 6.8  6.4 - 8.9 g/dL Final    ALBUMIN/GLOBULIN RATIO 07/11/2022 1.4  0.8 - 1.4 Final    OSMOLALITY, CALCULATED 07/11/2022 278  270 - 290 mOsm/kg Final    CALCIUM, CORRECTED 07/11/2022 9.2  8.9 - 10.8 mg/dL Final    GLOBULIN 14/78/2956 2.8 (L)  2.9 - 5.4 Final    WBC 07/11/2022 6.5  3.8 - 11.8 x10^3/uL Final    RBC 07/11/2022 5.73 (H)  3.63 - 4.92 x10^6/uL Final    HGB 07/11/2022 17.8 (H)  10.9 - 14.3 g/dL Final    HCT 21/30/8657 51.7 (H)  31.2 - 41.9 % Final    MCV 07/11/2022 90.3  75.5 - 95.3 fL Final    MCH 07/11/2022 31.0  24.7 - 32.8 pg Final    MCHC 07/11/2022 34.3  32.3 - 35.6 g/dL Final    RDW 84/69/6295 17.5  12.3 - 17.7 % Final    PLATELETS 07/11/2022 137 (L)  140 - 440 x10^3/uL Final    MPV 07/11/2022 9.0  7.9 - 10.8 fL Final    NEUTROPHIL % 07/11/2022 75  43 - 77 % Final    LYMPHOCYTE % 07/11/2022 17  16 - 44 % Final    MONOCYTE % 07/11/2022 7  5 - 13 % Final    EOSINOPHIL % 07/11/2022 2  % Final    BASOPHIL % 07/11/2022 1  0 - 1 % Final    NEUTROPHIL # 07/11/2022 4.90  1.85 - 7.80 x10^3/uL Final    LYMPHOCYTE # 07/11/2022 1.10  1.00 - 3.00 x10^3/uL Final    MONOCYTE # 07/11/2022 0.40  0.30 - 1.00 x10^3/uL Final    EOSINOPHIL # 07/11/2022 0.10  0.00 - 0.50 x10^3/uL Final    BASOPHIL # 07/11/2022 0.00  0.00 - 0.10 x10^3/uL Final   Appointment on 06/30/2022   Component Date Value Ref Range Status    SODIUM 06/30/2022 140  136 - 145 mmol/L Final    POTASSIUM 06/30/2022 4.1  3.5 - 5.1 mmol/L Final    CHLORIDE 06/30/2022 104  98 - 107 mmol/L Final    CO2 TOTAL 06/30/2022 30  21 - 31 mmol/L Final     ANION GAP 06/30/2022 6  4 - 13 mmol/L Final    BUN 06/30/2022 12  7 - 25 mg/dL Final    CREATININE 28/41/3244 0.72  0.60 - 1.30 mg/dL Final    BUN/CREA RATIO 06/30/2022 17  6 - 22 Final    ESTIMATED GFR 06/30/2022 100  >59 mL/min/1.6m^2 Final    ALBUMIN 06/30/2022 4.1  3.5 - 5.7 g/dL Final    CALCIUM 04/23/7251 9.2  8.6 - 10.3 mg/dL Final    GLUCOSE 66/44/0347 185 (H)  74 - 109 mg/dL Final    ALKALINE PHOSPHATASE 06/30/2022 58  34 - 104 U/L Final    ALT (SGPT) 06/30/2022 72 (H)  7 - 52 U/L Final    AST (SGOT) 06/30/2022 36  13 - 39  U/L Final    BILIRUBIN TOTAL 06/30/2022 1.0  0.3 - 1.2 mg/dL Final    PROTEIN TOTAL 06/30/2022 6.8  6.4 - 8.9 g/dL Final    ALBUMIN/GLOBULIN RATIO 06/30/2022 1.5 (H)  0.8 - 1.4 Final    OSMOLALITY, CALCULATED 06/30/2022 284  270 - 290 mOsm/kg Final    CALCIUM, CORRECTED 06/30/2022 9.1  8.9 - 10.8 mg/dL Final    GLOBULIN 44/04/270 2.7 (L)  2.9 - 5.4 Final    ERYTHROPOIETIN (EPO) 06/30/2022 5.3  2.6 - 18.5 mIU/mL Final    D-DIMER 06/30/2022 459  215 - 500 ng/mL FEU Final    WBC 06/30/2022 10.1  3.8 - 11.8 x10^3/uL Final    RBC 06/30/2022 6.09 (H)  3.63 - 4.92 x10^6/uL Final    HGB 06/30/2022 18.7 (H)  10.9 - 14.3 g/dL Final    HCT 53/66/4403 55.1 (H)  31.2 - 41.9 % Final    MCV 06/30/2022 90.5  75.5 - 95.3 fL Final    MCH 06/30/2022 30.7  24.7 - 32.8 pg Final    MCHC 06/30/2022 33.9  32.3 - 35.6 g/dL Final    RDW 47/42/5956 17.7  12.3 - 17.7 % Final    PLATELETS 06/30/2022 168  140 - 440 x10^3/uL Final    MPV 06/30/2022 8.9  7.9 - 10.8 fL Final    NEUTROPHIL % 06/30/2022 86 (H)  43 - 77 % Final    LYMPHOCYTE % 06/30/2022 10 (L)  16 - 44 % Final    MONOCYTE % 06/30/2022 4 (L)  5 - 13 % Final    EOSINOPHIL % 06/30/2022 0 (L)  % Final    BASOPHIL % 06/30/2022 0  0 - 1 % Final    NEUTROPHIL # 06/30/2022 8.70 (H)  1.85 - 7.80 x10^3/uL Final    LYMPHOCYTE # 06/30/2022 1.00  1.00 - 3.00 x10^3/uL Final    MONOCYTE # 06/30/2022 0.40  0.30 - 1.00 x10^3/uL Final    EOSINOPHIL # 06/30/2022 0.00  0.00  - 0.50 x10^3/uL Final    BASOPHIL # 06/30/2022 0.00  0.00 - 0.10 x10^3/uL Final   Admission on 06/23/2022, Discharged on 06/23/2022   Component Date Value Ref Range Status    SODIUM 06/23/2022 141  136 - 145 mmol/L Final    POTASSIUM 06/23/2022 4.3  3.5 - 5.1 mmol/L Final    CHLORIDE 06/23/2022 104  98 - 107 mmol/L Final    CO2 TOTAL 06/23/2022 25  21 - 32 mmol/L Final    ANION GAP 06/23/2022 12  4 - 13 mmol/L Final    CALCIUM 06/23/2022 9.3  8.5 - 10.1 mg/dL Final    GLUCOSE 38/75/6433 142 (H)  74 - 106 mg/dL Final    BUN 29/51/8841 15  7 - 18 mg/dL Final    CREATININE 66/09/3014 0.80  0.55 - 1.02 mg/dL Final    BUN/CREA RATIO 06/23/2022 19   Final    ESTIMATED GFR 06/23/2022 88  >59 mL/min/1.4m^2 Final    OSMOLALITY, CALCULATED 06/23/2022 285  270 - 290 mOsm/kg Final    TROPONIN I 06/23/2022 6  <15 ng/L Final    Ventricular rate 06/23/2022 79  BPM Final    Atrial Rate 06/23/2022 79  BPM Final    PR Interval 06/23/2022 140  ms Final    QRS Duration 06/23/2022 104  ms Final    QT Interval 06/23/2022 386  ms Final    QTC Calculation 06/23/2022 442  ms Final    Calculated P Axis 06/23/2022  57  degrees Final    Calculated R Axis 06/23/2022 103  degrees Final    Calculated T Axis 06/23/2022 42  degrees Final    WBC 06/23/2022 10.0  4.0 - 10.5 x10^3/uL Final    RBC 06/23/2022 6.28 (H)  4.20 - 5.40 x10^6/uL Final    HGB 06/23/2022 19.1 (H)  12.5 - 16.0 g/dL Final    HCT 22/05/5425 57.3 (H)  37.0 - 47.0 % Final    MCV 06/23/2022 91.3  78.0 - 99.0 fL Final    MCH 06/23/2022 30.4  27.0 - 32.0 pg Final    MCHC 06/23/2022 33.3  32.0 - 36.0 g/dL Final    RDW 10/12/7626 18.0 (H)  11.6 - 14.8 % Final    PLATELETS 06/23/2022 211  140 - 440 x10^3/uL Final    MPV 06/23/2022 8.8  7.4 - 10.4 fL Final    NEUTROPHIL % 06/23/2022 64  40 - 76 % Final    LYMPHOCYTE % 06/23/2022 28  25 - 45 % Final    MONOCYTE % 06/23/2022 6  0 - 12 % Final    EOSINOPHIL % 06/23/2022 2  0 - 7 % Final    BASOPHIL % 06/23/2022 0  0 - 3 % Final    NEUTROPHIL  # 06/23/2022 6.43  1.80 - 8.40 x10^3/uL Final    LYMPHOCYTE # 06/23/2022 2.77  1.10 - 5.00 x10^3/uL Final    MONOCYTE # 06/23/2022 0.61  0.00 - 1.30 x10^3/uL Final    EOSINOPHIL # 06/23/2022 0.16  0.00 - 0.80 x10^3/uL Final    BASOPHIL # 06/23/2022 0.01  0.00 - 0.30 x10^3/uL Final    TROPONIN I 06/23/2022 8  <15 ng/L Final   Hospital Outpatient Visit on 06/13/2022   Component Date Value Ref Range Status    SODIUM 06/13/2022 140  136 - 145 mmol/L Final    POTASSIUM 06/13/2022 3.8  3.5 - 5.1 mmol/L Final    CHLORIDE 06/13/2022 103  98 - 107 mmol/L Final    CO2 TOTAL 06/13/2022 31  21 - 31 mmol/L Final    ANION GAP 06/13/2022 6  4 - 13 mmol/L Final    BUN 06/13/2022 12  7 - 25 mg/dL Final    CREATININE 31/51/7616 0.64  0.60 - 1.30 mg/dL Final    BUN/CREA RATIO 06/13/2022 19  6 - 22 Final    ESTIMATED GFR 06/13/2022 106  >59 mL/min/1.43m^2 Final    ALBUMIN 06/13/2022 4.0  3.5 - 5.7 g/dL Final    CALCIUM 07/37/1062 9.4  8.6 - 10.3 mg/dL Final    GLUCOSE 69/48/5462 70 (L)  74 - 109 mg/dL Final    ALKALINE PHOSPHATASE 06/13/2022 67  34 - 104 U/L Final    ALT (SGPT) 06/13/2022 117 (H)  7 - 52 U/L Final    AST (SGOT) 06/13/2022 58 (H)  13 - 39 U/L Final    BILIRUBIN TOTAL 06/13/2022 0.8  0.3 - 1.2 mg/dL Final    PROTEIN TOTAL 06/13/2022 7.3  6.4 - 8.9 g/dL Final    ALBUMIN/GLOBULIN RATIO 06/13/2022 1.2  0.8 - 1.4 Final    OSMOLALITY, CALCULATED 06/13/2022 278  270 - 290 mOsm/kg Final    CALCIUM, CORRECTED 06/13/2022 9.4  8.9 - 10.8 mg/dL Final    GLOBULIN 70/35/0093 3.3  2.9 - 5.4 Final    WBC 06/13/2022 11.3  3.8 - 11.8 x10^3/uL Final    RBC 06/13/2022 6.06 (H)  3.63 - 4.92 x10^6/uL Final    HGB 06/13/2022 18.1 (  H)  10.9 - 14.3 g/dL Final    HCT 16/01/9603 54.1 (H)  31.2 - 41.9 % Final    MCV 06/13/2022 89.1  75.5 - 95.3 fL Final    MCH 06/13/2022 29.8  24.7 - 32.8 pg Final    MCHC 06/13/2022 33.4  32.3 - 35.6 g/dL Final    RDW 54/12/8117 17.7  12.3 - 17.7 % Final    PLATELETS 06/13/2022 173  140 - 440 x10^3/uL Final    MPV  06/13/2022 8.9  7.9 - 10.8 fL Final    NEUTROPHIL % 06/13/2022 63  43 - 77 % Final    LYMPHOCYTE % 06/13/2022 27  16 - 44 % Final    MONOCYTE % 06/13/2022 8  5 - 13 % Final    EOSINOPHIL % 06/13/2022 1  % Final    BASOPHIL % 06/13/2022 1  0 - 1 % Final    NEUTROPHIL # 06/13/2022 7.10  1.85 - 7.80 x10^3/uL Final    LYMPHOCYTE # 06/13/2022 3.10 (H)  1.00 - 3.00 x10^3/uL Final    MONOCYTE # 06/13/2022 0.90  0.30 - 1.00 x10^3/uL Final    EOSINOPHIL # 06/13/2022 0.10  0.00 - 0.50 x10^3/uL Final    BASOPHIL # 06/13/2022 0.10  0.00 - 0.10 x10^3/uL Final   Appointment on 05/28/2022   Component Date Value Ref Range Status    TSH 05/28/2022 5.079  0.450 - 5.330 uIU/mL Final    SODIUM 05/28/2022 140  136 - 145 mmol/L Final    POTASSIUM 05/28/2022 4.6  3.5 - 5.1 mmol/L Final    CHLORIDE 05/28/2022 102  98 - 107 mmol/L Final    CO2 TOTAL 05/28/2022 33 (H)  21 - 31 mmol/L Final    ANION GAP 05/28/2022 5  4 - 13 mmol/L Final    BUN 05/28/2022 10  7 - 25 mg/dL Final    CREATININE 14/78/2956 0.67  0.60 - 1.30 mg/dL Final    BUN/CREA RATIO 05/28/2022 15  6 - 22 Final    ESTIMATED GFR 05/28/2022 105  >59 mL/min/1.63m^2 Final    ALBUMIN 05/28/2022 4.0  3.5 - 5.7 g/dL Final    CALCIUM 21/30/8657 9.9  8.6 - 10.3 mg/dL Final    GLUCOSE 84/69/6295 118 (H)  74 - 109 mg/dL Final    ALKALINE PHOSPHATASE 05/28/2022 86  34 - 104 U/L Final    ALT (SGPT) 05/28/2022 388 (H)  7 - 52 U/L Final    AST (SGOT) 05/28/2022 271 (H)  13 - 39 U/L Final    BILIRUBIN TOTAL 05/28/2022 1.5 (H)  0.3 - 1.2 mg/dL Final    PROTEIN TOTAL 05/28/2022 7.5  6.4 - 8.9 g/dL Final    ALBUMIN/GLOBULIN RATIO 05/28/2022 1.1  0.8 - 1.4 Final    OSMOLALITY, CALCULATED 05/28/2022 280  270 - 290 mOsm/kg Final    CALCIUM, CORRECTED 05/28/2022 9.9  8.9 - 10.8 mg/dL Final    GLOBULIN 28/41/3244 3.5  2.9 - 5.4 Final   Telemedicine on 05/26/2022   Component Date Value Ref Range Status    REPORT SUMMARY 05/26/2022 NEGATIVE   Final    Negative for 81 out of 81 genes.  No known  pathogenic or likely pathogenic variants were detected in the 81 genes analyzed. A Tyrer-Cuzick breast cancer risk assessment was performed and a lifetime breast cancer risk was calculated to be  > = 20%    FOOTNOTES 05/26/2022 See Notes   Final    CLIA: ID #[id number]  Test performed  by Dallie Piles. [address] [place], [place] [address]   J. Luana Shu, Ph.D., [facility], Laboratory Director   Appointment on 05/23/2022   Component Date Value Ref Range Status    SODIUM 05/23/2022 140  136 - 145 mmol/L Final    POTASSIUM 05/23/2022 4.2  3.5 - 5.1 mmol/L Final    CHLORIDE 05/23/2022 103  98 - 107 mmol/L Final    CO2 TOTAL 05/23/2022 30  21 - 31 mmol/L Final    ANION GAP 05/23/2022 7  4 - 13 mmol/L Final    BUN 05/23/2022 11  7 - 25 mg/dL Final    CREATININE 98/02/9146 0.74  0.60 - 1.30 mg/dL Final    BUN/CREA RATIO 05/23/2022 15  6 - 22 Final    ESTIMATED GFR 05/23/2022 97  >59 mL/min/1.74m^2 Final    ALBUMIN 05/23/2022 4.0  3.5 - 5.7 g/dL Final    CALCIUM 82/95/6213 9.8  8.6 - 10.3 mg/dL Final    GLUCOSE 08/65/7846 162 (H)  74 - 109 mg/dL Final    ALKALINE PHOSPHATASE 05/23/2022 84  34 - 104 U/L Final    ALT (SGPT) 05/23/2022 342 (H)  7 - 52 U/L Final    AST (SGOT) 05/23/2022 232 (H)  13 - 39 U/L Final    BILIRUBIN TOTAL 05/23/2022 1.1  0.3 - 1.2 mg/dL Final    PROTEIN TOTAL 05/23/2022 7.1  6.4 - 8.9 g/dL Final    ALBUMIN/GLOBULIN RATIO 05/23/2022 1.3  0.8 - 1.4 Final    OSMOLALITY, CALCULATED 05/23/2022 282  270 - 290 mOsm/kg Final    CALCIUM, CORRECTED 05/23/2022 9.8  8.9 - 10.8 mg/dL Final    GLOBULIN 96/29/5284 3.1  2.9 - 5.4 Final    WBC 05/23/2022 6.9  3.8 - 11.8 x10^3/uL Final    RBC 05/23/2022 5.91 (H)  3.63 - 4.92 x10^6/uL Final    HGB 05/23/2022 17.3 (H)  10.9 - 14.3 g/dL Final    HCT 13/24/4010 52.4 (H)  31.2 - 41.9 % Final    MCV 05/23/2022 88.7  75.5 - 95.3 fL Final    MCH 05/23/2022 29.3  24.7 - 32.8 pg Final    MCHC 05/23/2022 33.0  32.3 - 35.6 g/dL Final    RDW 27/25/3664 17.9 (H)  12.3 -  17.7 % Final    PLATELETS 05/23/2022 138 (L)  140 - 440 x10^3/uL Final    MPV 05/23/2022 8.3  7.9 - 10.8 fL Final    NEUTROPHIL % 05/23/2022 71  43 - 77 % Final    LYMPHOCYTE % 05/23/2022 20  16 - 44 % Final    MONOCYTE % 05/23/2022 8  5 - 13 % Final    EOSINOPHIL % 05/23/2022 2  % Final    BASOPHIL % 05/23/2022 1  0 - 1 % Final    NEUTROPHIL # 05/23/2022 4.90  1.85 - 7.80 x10^3/uL Final    LYMPHOCYTE # 05/23/2022 1.40  1.00 - 3.00 x10^3/uL Final    MONOCYTE # 05/23/2022 0.50  0.30 - 1.00 x10^3/uL Final    EOSINOPHIL # 05/23/2022 0.10  0.00 - 0.50 x10^3/uL Final    BASOPHIL # 05/23/2022 0.00  0.00 - 0.10 x10^3/uL Final   Hospital Outpatient Visit on 05/16/2022   Component Date Value Ref Range Status    SODIUM 05/16/2022 141  136 - 145 mmol/L Final    POTASSIUM 05/16/2022 3.5  3.5 - 5.1 mmol/L Final    CHLORIDE 05/16/2022 103  98 - 107 mmol/L Final  CO2 TOTAL 05/16/2022 31  21 - 31 mmol/L Final    ANION GAP 05/16/2022 7  4 - 13 mmol/L Final    BUN 05/16/2022 6 (L)  7 - 25 mg/dL Final    CREATININE 16/01/9603 0.66  0.60 - 1.30 mg/dL Final    BUN/CREA RATIO 05/16/2022 9  6 - 22 Final    ESTIMATED GFR 05/16/2022 105  >59 mL/min/1.21m^2 Final    ALBUMIN 05/16/2022 3.9  3.5 - 5.7 g/dL Final    CALCIUM 54/12/8117 9.3  8.6 - 10.3 mg/dL Final    GLUCOSE 14/78/2956 115 (H)  74 - 109 mg/dL Final    ALKALINE PHOSPHATASE 05/16/2022 91  34 - 104 U/L Final    ALT (SGPT) 05/16/2022 243 (H)  7 - 52 U/L Final    AST (SGOT) 05/16/2022 135 (H)  13 - 39 U/L Final    BILIRUBIN TOTAL 05/16/2022 0.9  0.3 - 1.2 mg/dL Final    PROTEIN TOTAL 05/16/2022 7.0  6.4 - 8.9 g/dL Final    ALBUMIN/GLOBULIN RATIO 05/16/2022 1.3  0.8 - 1.4 Final    OSMOLALITY, CALCULATED 05/16/2022 280  270 - 290 mOsm/kg Final    CALCIUM, CORRECTED 05/16/2022 9.4  8.9 - 10.8 mg/dL Final    GLOBULIN 21/30/8657 3.1  2.9 - 5.4 Final    WBC 05/16/2022 5.4  3.8 - 11.8 x10^3/uL Final    RBC 05/16/2022 5.95 (H)  3.63 - 4.92 x10^6/uL Final    HGB 05/16/2022 17.4 (H)  10.9 -  14.3 g/dL Final    HCT 84/69/6295 52.5 (H)  31.2 - 41.9 % Final    MCV 05/16/2022 88.3  75.5 - 95.3 fL Final    MCH 05/16/2022 29.2  24.7 - 32.8 pg Final    MCHC 05/16/2022 33.1  32.3 - 35.6 g/dL Final    RDW 28/41/3244 17.3  12.3 - 17.7 % Final    PLATELETS 05/16/2022 143  140 - 440 x10^3/uL Final    MPV 05/16/2022 8.5  7.9 - 10.8 fL Final    NEUTROPHIL % 05/16/2022 57  43 - 77 % Final    LYMPHOCYTE % 05/16/2022 30  16 - 44 % Final    MONOCYTE % 05/16/2022 10  5 - 13 % Final    EOSINOPHIL % 05/16/2022 2  % Final    BASOPHIL % 05/16/2022 1  0 - 1 % Final    NEUTROPHIL # 05/16/2022 3.10  1.85 - 7.80 x10^3/uL Final    LYMPHOCYTE # 05/16/2022 1.60  1.00 - 3.00 x10^3/uL Final    MONOCYTE # 05/16/2022 0.60  0.30 - 1.00 x10^3/uL Final    EOSINOPHIL # 05/16/2022 0.10  0.00 - 0.50 x10^3/uL Final    BASOPHIL # 05/16/2022 0.00  0.00 - 0.10 x10^3/uL Final   Hospital Outpatient Visit on 04/18/2022   Component Date Value Ref Range Status    SODIUM 04/18/2022 136  136 - 145 mmol/L Final    POTASSIUM 04/18/2022 3.9  3.5 - 5.1 mmol/L Final    CHLORIDE 04/18/2022 102  98 - 107 mmol/L Final    CO2 TOTAL 04/18/2022 27  21 - 31 mmol/L Final    ANION GAP 04/18/2022 7  4 - 13 mmol/L Final    BUN 04/18/2022 9  7 - 25 mg/dL Final    CREATININE 04/23/7251 0.63  0.60 - 1.30 mg/dL Final    BUN/CREA RATIO 04/18/2022 14  6 - 22 Final    ESTIMATED GFR 04/18/2022 107  >59 mL/min/1.62m^2 Final  ALBUMIN 04/18/2022 3.7  3.5 - 5.7 g/dL Final    CALCIUM 16/01/9603 9.2  8.6 - 10.3 mg/dL Final    GLUCOSE 54/12/8117 134 (H)  74 - 109 mg/dL Final    ALKALINE PHOSPHATASE 04/18/2022 170 (H)  34 - 104 U/L Final    ALT (SGPT) 04/18/2022 438 (H)  7 - 52 U/L Final    AST (SGOT) 04/18/2022 264 (H)  13 - 39 U/L Final    BILIRUBIN TOTAL 04/18/2022 3.3 (H)  0.3 - 1.2 mg/dL Final    PROTEIN TOTAL 04/18/2022 7.3  6.4 - 8.9 g/dL Final    ALBUMIN/GLOBULIN RATIO 04/18/2022 1.0  0.8 - 1.4 Final    OSMOLALITY, CALCULATED 04/18/2022 273  270 - 290 mOsm/kg Final     CALCIUM, CORRECTED 04/18/2022 9.4  8.9 - 10.8 mg/dL Final    GLOBULIN 14/78/2956 3.6  2.9 - 5.4 Final    WBC 04/18/2022 6.5  3.8 - 11.8 x10^3/uL Final    RBC 04/18/2022 5.96 (H)  3.63 - 4.92 x10^6/uL Final    HGB 04/18/2022 17.0 (H)  10.9 - 14.3 g/dL Final    HCT 21/30/8657 52.2 (H)  31.2 - 41.9 % Final    MCV 04/18/2022 87.5  75.5 - 95.3 fL Final    MCH 04/18/2022 28.6  24.7 - 32.8 pg Final    MCHC 04/18/2022 32.6  32.3 - 35.6 g/dL Final    RDW 84/69/6295 17.0  12.3 - 17.7 % Final    PLATELETS 04/18/2022 119 (L)  140 - 440 x10^3/uL Final    MPV 04/18/2022 9.6  7.9 - 10.8 fL Final    NEUTROPHIL % 04/18/2022 84 (H)  43 - 77 % Final    LYMPHOCYTE % 04/18/2022 7 (L)  16 - 44 % Final    MONOCYTE % 04/18/2022 7  5 - 13 % Final    EOSINOPHIL % 04/18/2022 2  % Final    BASOPHIL % 04/18/2022 0  0 - 1 % Final    NEUTROPHIL # 04/18/2022 5.50  1.85 - 7.80 x10^3/uL Final    LYMPHOCYTE # 04/18/2022 0.40 (L)  1.00 - 3.00 x10^3/uL Final    MONOCYTE # 04/18/2022 0.40  0.30 - 1.00 x10^3/uL Final    EOSINOPHIL # 04/18/2022 0.10  0.00 - 0.50 x10^3/uL Final    BASOPHIL # 04/18/2022 0.00  0.00 - 0.10 x10^3/uL Final   There may be more visits with results that are not included.        Health Maintenance:  Health Maintenance   Topic Date Due    Hepatitis C screening  Never done    Pneumococcal Vaccine, Age 54-64 (1 of 2 - PCV) Never done    HIV Screening  Never done    Adult Tdap-Td (1 - Tdap) Never done    Hepatitis B Vaccine (1 of 3 - 19+ 3-dose series) Never done    Shingles Vaccine (1 of 2) Never done    Pap smear  Never done    Colonoscopy  Never done    Mammography  Never done    Covid-19 Vaccine (4 - 2023-24 season) 12/20/2021    Depression Screening  11/08/2022    NonMedicare Preventative Exam  06/26/2023    Influenza Vaccine  Completed    Meningococcal Vaccine  Aged Out        Assessment & Plan   Problem List Items Addressed This Visit          Cardiovascular System    Primary hypertension -  Primary (Chronic)     Patient's  valsartan increased to 80 mg twice daily, patient to continue to monitor blood pressure at home and  continue with heart healthy diet.   Will reevaluate in appx 2 weeks.                  Follow up:  Return in about 2 weeks (around 09/26/2022), or if symptoms worsen or fail to improve.    This note was partially created using voice recognition software and is inherently subject to errors including those of syntax and "sound alike " substitutions which may escape proof reading.  In such instances, original meaning may be extrapolated by contextual derivation.    Macario Golds, FNP-C  09/12/2022, 10:07

## 2022-09-16 ENCOUNTER — Encounter (INDEPENDENT_AMBULATORY_CARE_PROVIDER_SITE_OTHER): Payer: Self-pay | Admitting: NURSE PRACTITIONER

## 2022-09-25 ENCOUNTER — Encounter (INDEPENDENT_AMBULATORY_CARE_PROVIDER_SITE_OTHER): Payer: 59 | Admitting: NURSE PRACTITIONER

## 2022-09-25 ENCOUNTER — Ambulatory Visit (INDEPENDENT_AMBULATORY_CARE_PROVIDER_SITE_OTHER): Payer: 59 | Admitting: NURSE PRACTITIONER

## 2022-09-25 ENCOUNTER — Encounter (INDEPENDENT_AMBULATORY_CARE_PROVIDER_SITE_OTHER): Payer: Self-pay | Admitting: NURSE PRACTITIONER

## 2022-09-25 ENCOUNTER — Other Ambulatory Visit: Payer: Self-pay

## 2022-09-25 VITALS — BP 154/98 | HR 71 | Ht 69.02 in | Wt 223.0 lb

## 2022-09-25 DIAGNOSIS — I10 Essential (primary) hypertension: Secondary | ICD-10-CM

## 2022-09-25 NOTE — Assessment & Plan Note (Signed)
Condition is improving. Continue with valsartan and aldactone. Continue to monitor blood pressure at home and keep log.

## 2022-09-25 NOTE — Progress Notes (Signed)
[facility], [facility]  [address]  [place] [address]       Name: Taylor Smith MRN:  [id number]   Date: 09/25/2022 Age: 52 y.o.       Chief Complaint:    Chief Complaint   Patient presents with    Follow Up     2 week f/u on BP.        HPI:  Taylor Smith is a 52 y.o. female who is here today to f/u up on blood pressure. She informs it has been doing better at home, but that she just had a stress test the other day at the hospital with Dr. Renda Rolls and her blood pressure was still running really high so he went ahead and increased her valsartan to 320 mg and also took her off her HCTZ and started aldactone.     Her blood pressure is still mildly elevated today in office, but she is asymptomatic.    She denies any other problems or complaints.          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 to other site, left (CMS [facility])          Past Surgical History:   Procedure Laterality Date    HX CESAREAN SECTION      HX CHOLECYSTECTOMY      HX CYST REMOVAL Right     HX HYSTERECTOMY      TOTAL THYROIDECTOMY        Current Outpatient Medications   Medication Sig    apixaban (ELIQUIS) 5 mg Oral Tablet Take 1 Tablet (5 mg total) by mouth Twice daily    axitinib (INLYTA) 5 mg Oral Tablet Take 1 Tablet (5 mg total) by mouth Twice daily (Patient taking differently: Take 1 Tablet (5 mg total) by mouth Twice daily Pt currently not taking due to upcoming surgery.)    ergocalciferol, vitamin D2, (DRISDOL) 1,250 mcg (50,000 unit) Oral Capsule Take 1 Capsule (50,000 Units total) by mouth Every 7 days    hydroCHLOROthiazide (MICROZIDE) 12.5 mg Oral Capsule Take 1 Capsule (12.5 mg total) by mouth Once a day Indications: high blood pressure (Patient not taking: Reported on  09/25/2022)    Ibuprofen (MOTRIN) 600 mg Oral Tablet Take 1 Tablet (600 mg total) by mouth Every 6 hours as needed    levothyroxine (SYNTHROID) 125 mcg Oral Tablet Take 1 Tablet (125 mcg total) by mouth Every morning    omeprazole (PRILOSEC) 40 mg Oral Capsule, Delayed Release(E.C.) Take 1 Capsule (40 mg total) by mouth Once a day    spironolactone (ALDACTONE) 25 mg Oral Tablet Take 1 Tablet (25 mg total) by mouth Once a day    tiZANidine (ZANAFLEX) 2 mg Oral Tablet Take 1 Tablet (2 mg total) by mouth Three times a day Indications: muscle spasm    valsartan (DIOVAN) 320 mg Oral Tablet Take 1 Tablet (320 mg total) by mouth Once a day     No Known Allergies    Family History:  Family Medical History:       Problem Relation (Age of Onset)    Cancer Maternal Aunt, Maternal Grandmother    Colon Cancer Maternal Uncle    Lung Cancer Mother  Melanoma Maternal Aunt              Social History:   Social History     Tobacco Use   Smoking Status Never   Smokeless Tobacco Never     Social History     Substance and Sexual Activity   Alcohol Use None     Social History     Occupational History    Not on file       Review of Systems:  Review of systems as discussed in HPI    Problem List:  Patient Active Problem List   Diagnosis    Vitamin D deficiency    Hypothyroid    GERD (gastroesophageal reflux disease)    Primary hypertension    Neural foraminal stenosis of cervical spine    Osteophyte of cervical spine    S/P hysterectomy    Kidney cancer, primary, 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   Constitutional:       Appearance: Normal appearance.   HENT:      Head: Normocephalic and atraumatic.   Cardiovascular:      Rate and Rhythm: Normal rate.      Pulses:  Normal pulses.      Heart sounds: Normal heart sounds, S1 normal and S2 normal.   Pulmonary:      Effort: Pulmonary effort is normal.      Breath sounds: Normal breath sounds.   Abdominal:      General: Abdomen is flat. Bowel sounds are normal.      Palpations: Abdomen is soft.   Skin:     General: Skin is warm and dry.      Capillary Refill: Capillary refill takes less than 2 seconds.   Neurological:      General: No focal deficit present.      Mental Status: She is alert and oriented to person, place, and time.        Data Reviewed:  Admission on 08/25/2022, Discharged on 08/26/2022   Component Date Value Ref Range Status    SODIUM 08/25/2022 141  136 - 145 mmol/L Final    POTASSIUM 08/25/2022 3.6  3.5 - 5.1 mmol/L Final    CHLORIDE 08/25/2022 103  98 - 107 mmol/L Final    CO2 TOTAL 08/25/2022 29  21 - 32 mmol/L Final    ANION GAP 08/25/2022 9  4 - 13 mmol/L Final    BUN 08/25/2022 7  7 - 18 mg/dL Final    CREATININE 16/01/9603 0.67  0.55 - 1.02 mg/dL Final    BUN/CREA RATIO 08/25/2022 10   Final    ESTIMATED GFR 08/25/2022 104  >59 mL/min/1.65m^2 Final    ALBUMIN 08/25/2022 3.8  3.4 - 5.0 g/dL Final    Previously suppressed result.    CALCIUM 08/25/2022 8.9  8.5 - 10.1 mg/dL Final    GLUCOSE 54/12/8117 101  74 - 106 mg/dL Final    ALKALINE PHOSPHATASE 08/25/2022 86  46 - 116 U/L Final    Previously suppressed result.    ALT (SGPT) 08/25/2022 49  <=78 U/L Final    Previously suppressed  result.    AST (SGOT) 08/25/2022 43 (H)  15 - 37 U/L Final    Previously suppressed result.    BILIRUBIN TOTAL 08/25/2022 0.8  0.2 - 1.0 mg/dL Final    Previously suppressed result.    PROTEIN TOTAL 08/25/2022 7.8  6.4 - 8.2 g/dL Final    Previously suppressed result.    ALBUMIN/GLOBULIN RATIO 08/25/2022 1.0  0.8 - 1.4 Final    Comment: Calculation not performed.                               Previously suppressed result.    OSMOLALITY, CALCULATED 08/25/2022 279  270 - 290 mOsm/kg Final    CALCIUM, CORRECTED 08/25/2022 9.1  mg/dL  Final    Previously suppressed result.    GLOBULIN 08/25/2022 4.0   Final    Comment: Calculation not performed.    Calculation not performed.                               Previously suppressed result.    WBC 08/25/2022 5.4  4.0 - 10.5 x10^3/uL Final    RBC 08/25/2022 5.27  4.20 - 5.40 x10^6/uL Final    HGB 08/25/2022 17.2 (H)  12.5 - 16.0 g/dL Final    HCT 16/01/9603 50.9 (H)  37.0 - 47.0 % Final    MCV 08/25/2022 96.5  78.0 - 99.0 fL Final    MCH 08/25/2022 32.6 (H)  27.0 - 32.0 pg Final    MCHC 08/25/2022 33.7  32.0 - 36.0 g/dL Final    RDW 54/12/8117 15.9 (H)  11.6 - 14.8 % Final    PLATELETS 08/25/2022 169  140 - 440 x10^3/uL Final    MPV 08/25/2022 9.1  7.4 - 10.4 fL Final    NEUTROPHIL % 08/25/2022 55  40 - 76 % Final    LYMPHOCYTE % 08/25/2022 32  25 - 45 % Final    MONOCYTE % 08/25/2022 9  0 - 12 % Final    EOSINOPHIL % 08/25/2022 4  0 - 7 % Final    BASOPHIL % 08/25/2022 0  0 - 3 % Final    NEUTROPHIL # 08/25/2022 2.97  1.80 - 8.40 x10^3/uL Final    LYMPHOCYTE # 08/25/2022 1.71  1.10 - 5.00 x10^3/uL Final    MONOCYTE # 08/25/2022 0.46  0.00 - 1.30 x10^3/uL Final    EOSINOPHIL # 08/25/2022 0.24  0.00 - 0.80 x10^3/uL Final    BASOPHIL # 08/25/2022 0.00  0.00 - 0.30 x10^3/uL Final    RAINBOW/EXTRA TUBE AUTO RESULT 08/25/2022 Yes   Final    RAINBOW/EXTRA TUBE AUTO RESULT 08/25/2022 Yes   Final   Hospital Outpatient Visit on 07/11/2022   Component Date Value Ref Range Status    SODIUM 07/11/2022 138  136 - 145 mmol/L Final    POTASSIUM 07/11/2022 3.8  3.5 - 5.1 mmol/L Final    CHLORIDE 07/11/2022 102  98 - 107 mmol/L Final    CO2 TOTAL 07/11/2022 29  21 - 31 mmol/L Final    ANION GAP 07/11/2022 7  4 - 13 mmol/L Final    BUN 07/11/2022 8  7 - 25 mg/dL Final    CREATININE 14/78/2956 0.74  0.60 - 1.30 mg/dL Final    BUN/CREA RATIO 07/11/2022 11  6 - 22 Final    ESTIMATED GFR 07/11/2022 97  >59 mL/min/1.47m^2 Final  ALBUMIN 07/11/2022 4.0  3.5 - 5.7 g/dL Final    CALCIUM 16/01/9603 9.2  8.6 - 10.3 mg/dL Final     GLUCOSE 54/12/8117 169 (H)  74 - 109 mg/dL Final    ALKALINE PHOSPHATASE 07/11/2022 58  34 - 104 U/L Final    ALT (SGPT) 07/11/2022 119 (H)  7 - 52 U/L Final    AST (SGOT) 07/11/2022 71 (H)  13 - 39 U/L Final    BILIRUBIN TOTAL 07/11/2022 1.0  0.3 - 1.2 mg/dL Final    PROTEIN TOTAL 07/11/2022 6.8  6.4 - 8.9 g/dL Final    ALBUMIN/GLOBULIN RATIO 07/11/2022 1.4  0.8 - 1.4 Final    OSMOLALITY, CALCULATED 07/11/2022 278  270 - 290 mOsm/kg Final    CALCIUM, CORRECTED 07/11/2022 9.2  8.9 - 10.8 mg/dL Final    GLOBULIN 14/78/2956 2.8 (L)  2.9 - 5.4 Final    WBC 07/11/2022 6.5  3.8 - 11.8 x10^3/uL Final    RBC 07/11/2022 5.73 (H)  3.63 - 4.92 x10^6/uL Final    HGB 07/11/2022 17.8 (H)  10.9 - 14.3 g/dL Final    HCT 21/30/8657 51.7 (H)  31.2 - 41.9 % Final    MCV 07/11/2022 90.3  75.5 - 95.3 fL Final    MCH 07/11/2022 31.0  24.7 - 32.8 pg Final    MCHC 07/11/2022 34.3  32.3 - 35.6 g/dL Final    RDW 84/69/6295 17.5  12.3 - 17.7 % Final    PLATELETS 07/11/2022 137 (L)  140 - 440 x10^3/uL Final    MPV 07/11/2022 9.0  7.9 - 10.8 fL Final    NEUTROPHIL % 07/11/2022 75  43 - 77 % Final    LYMPHOCYTE % 07/11/2022 17  16 - 44 % Final    MONOCYTE % 07/11/2022 7  5 - 13 % Final    EOSINOPHIL % 07/11/2022 2  % Final    BASOPHIL % 07/11/2022 1  0 - 1 % Final    NEUTROPHIL # 07/11/2022 4.90  1.85 - 7.80 x10^3/uL Final    LYMPHOCYTE # 07/11/2022 1.10  1.00 - 3.00 x10^3/uL Final    MONOCYTE # 07/11/2022 0.40  0.30 - 1.00 x10^3/uL Final    EOSINOPHIL # 07/11/2022 0.10  0.00 - 0.50 x10^3/uL Final    BASOPHIL # 07/11/2022 0.00  0.00 - 0.10 x10^3/uL Final   Appointment on 06/30/2022   Component Date Value Ref Range Status    SODIUM 06/30/2022 140  136 - 145 mmol/L Final    POTASSIUM 06/30/2022 4.1  3.5 - 5.1 mmol/L Final    CHLORIDE 06/30/2022 104  98 - 107 mmol/L Final    CO2 TOTAL 06/30/2022 30  21 - 31 mmol/L Final    ANION GAP 06/30/2022 6  4 - 13 mmol/L Final    BUN 06/30/2022 12  7 - 25 mg/dL Final    CREATININE 28/41/3244 0.72  0.60 -  1.30 mg/dL Final    BUN/CREA RATIO 06/30/2022 17  6 - 22 Final    ESTIMATED GFR 06/30/2022 100  >59 mL/min/1.75m^2 Final    ALBUMIN 06/30/2022 4.1  3.5 - 5.7 g/dL Final    CALCIUM 04/23/7251 9.2  8.6 - 10.3 mg/dL Final    GLUCOSE 66/44/0347 185 (H)  74 - 109 mg/dL Final    ALKALINE PHOSPHATASE 06/30/2022 58  34 - 104 U/L Final    ALT (SGPT) 06/30/2022 72 (H)  7 - 52 U/L Final    AST (SGOT) 06/30/2022 36  13 -  39 U/L Final    BILIRUBIN TOTAL 06/30/2022 1.0  0.3 - 1.2 mg/dL Final    PROTEIN TOTAL 06/30/2022 6.8  6.4 - 8.9 g/dL Final    ALBUMIN/GLOBULIN RATIO 06/30/2022 1.5 (H)  0.8 - 1.4 Final    OSMOLALITY, CALCULATED 06/30/2022 284  270 - 290 mOsm/kg Final    CALCIUM, CORRECTED 06/30/2022 9.1  8.9 - 10.8 mg/dL Final    GLOBULIN 62/95/2841 2.7 (L)  2.9 - 5.4 Final    ERYTHROPOIETIN (EPO) 06/30/2022 5.3  2.6 - 18.5 mIU/mL Final    D-DIMER 06/30/2022 459  215 - 500 ng/mL FEU Final    WBC 06/30/2022 10.1  3.8 - 11.8 x10^3/uL Final    RBC 06/30/2022 6.09 (H)  3.63 - 4.92 x10^6/uL Final    HGB 06/30/2022 18.7 (H)  10.9 - 14.3 g/dL Final    HCT 32/44/0102 55.1 (H)  31.2 - 41.9 % Final    MCV 06/30/2022 90.5  75.5 - 95.3 fL Final    MCH 06/30/2022 30.7  24.7 - 32.8 pg Final    MCHC 06/30/2022 33.9  32.3 - 35.6 g/dL Final    RDW 72/53/6644 17.7  12.3 - 17.7 % Final    PLATELETS 06/30/2022 168  140 - 440 x10^3/uL Final    MPV 06/30/2022 8.9  7.9 - 10.8 fL Final    NEUTROPHIL % 06/30/2022 86 (H)  43 - 77 % Final    LYMPHOCYTE % 06/30/2022 10 (L)  16 - 44 % Final    MONOCYTE % 06/30/2022 4 (L)  5 - 13 % Final    EOSINOPHIL % 06/30/2022 0 (L)  % Final    BASOPHIL % 06/30/2022 0  0 - 1 % Final    NEUTROPHIL # 06/30/2022 8.70 (H)  1.85 - 7.80 x10^3/uL Final    LYMPHOCYTE # 06/30/2022 1.00  1.00 - 3.00 x10^3/uL Final    MONOCYTE # 06/30/2022 0.40  0.30 - 1.00 x10^3/uL Final    EOSINOPHIL # 06/30/2022 0.00  0.00 - 0.50 x10^3/uL Final    BASOPHIL # 06/30/2022 0.00  0.00 - 0.10 x10^3/uL Final   Admission on 06/23/2022, Discharged on  06/23/2022   Component Date Value Ref Range Status    SODIUM 06/23/2022 141  136 - 145 mmol/L Final    POTASSIUM 06/23/2022 4.3  3.5 - 5.1 mmol/L Final    CHLORIDE 06/23/2022 104  98 - 107 mmol/L Final    CO2 TOTAL 06/23/2022 25  21 - 32 mmol/L Final    ANION GAP 06/23/2022 12  4 - 13 mmol/L Final    CALCIUM 06/23/2022 9.3  8.5 - 10.1 mg/dL Final    GLUCOSE 03/47/4259 142 (H)  74 - 106 mg/dL Final    BUN 56/38/7564 15  7 - 18 mg/dL Final    CREATININE 33/29/5188 0.80  0.55 - 1.02 mg/dL Final    BUN/CREA RATIO 06/23/2022 19   Final    ESTIMATED GFR 06/23/2022 88  >59 mL/min/1.30m^2 Final    OSMOLALITY, CALCULATED 06/23/2022 285  270 - 290 mOsm/kg Final    TROPONIN I 06/23/2022 6  <15 ng/L Final    Ventricular rate 06/23/2022 79  BPM Final    Atrial Rate 06/23/2022 79  BPM Final    PR Interval 06/23/2022 140  ms Final    QRS Duration 06/23/2022 104  ms Final    QT Interval 06/23/2022 386  ms Final    QTC Calculation 06/23/2022 442  ms Final    Calculated P Axis  06/23/2022 57  degrees Final    Calculated R Axis 06/23/2022 103  degrees Final    Calculated T Axis 06/23/2022 42  degrees Final    WBC 06/23/2022 10.0  4.0 - 10.5 x10^3/uL Final    RBC 06/23/2022 6.28 (H)  4.20 - 5.40 x10^6/uL Final    HGB 06/23/2022 19.1 (H)  12.5 - 16.0 g/dL Final    HCT 16/01/9603 57.3 (H)  37.0 - 47.0 % Final    MCV 06/23/2022 91.3  78.0 - 99.0 fL Final    MCH 06/23/2022 30.4  27.0 - 32.0 pg Final    MCHC 06/23/2022 33.3  32.0 - 36.0 g/dL Final    RDW 54/12/8117 18.0 (H)  11.6 - 14.8 % Final    PLATELETS 06/23/2022 211  140 - 440 x10^3/uL Final    MPV 06/23/2022 8.8  7.4 - 10.4 fL Final    NEUTROPHIL % 06/23/2022 64  40 - 76 % Final    LYMPHOCYTE % 06/23/2022 28  25 - 45 % Final    MONOCYTE % 06/23/2022 6  0 - 12 % Final    EOSINOPHIL % 06/23/2022 2  0 - 7 % Final    BASOPHIL % 06/23/2022 0  0 - 3 % Final    NEUTROPHIL # 06/23/2022 6.43  1.80 - 8.40 x10^3/uL Final    LYMPHOCYTE # 06/23/2022 2.77  1.10 - 5.00 x10^3/uL Final    MONOCYTE #  06/23/2022 0.61  0.00 - 1.30 x10^3/uL Final    EOSINOPHIL # 06/23/2022 0.16  0.00 - 0.80 x10^3/uL Final    BASOPHIL # 06/23/2022 0.01  0.00 - 0.30 x10^3/uL Final    TROPONIN I 06/23/2022 8  <15 ng/L Final   Hospital Outpatient Visit on 06/13/2022   Component Date Value Ref Range Status    SODIUM 06/13/2022 140  136 - 145 mmol/L Final    POTASSIUM 06/13/2022 3.8  3.5 - 5.1 mmol/L Final    CHLORIDE 06/13/2022 103  98 - 107 mmol/L Final    CO2 TOTAL 06/13/2022 31  21 - 31 mmol/L Final    ANION GAP 06/13/2022 6  4 - 13 mmol/L Final    BUN 06/13/2022 12  7 - 25 mg/dL Final    CREATININE 14/78/2956 0.64  0.60 - 1.30 mg/dL Final    BUN/CREA RATIO 06/13/2022 19  6 - 22 Final    ESTIMATED GFR 06/13/2022 106  >59 mL/min/1.4m^2 Final    ALBUMIN 06/13/2022 4.0  3.5 - 5.7 g/dL Final    CALCIUM 21/30/8657 9.4  8.6 - 10.3 mg/dL Final    GLUCOSE 84/69/6295 70 (L)  74 - 109 mg/dL Final    ALKALINE PHOSPHATASE 06/13/2022 67  34 - 104 U/L Final    ALT (SGPT) 06/13/2022 117 (H)  7 - 52 U/L Final    AST (SGOT) 06/13/2022 58 (H)  13 - 39 U/L Final    BILIRUBIN TOTAL 06/13/2022 0.8  0.3 - 1.2 mg/dL Final    PROTEIN TOTAL 06/13/2022 7.3  6.4 - 8.9 g/dL Final    ALBUMIN/GLOBULIN RATIO 06/13/2022 1.2  0.8 - 1.4 Final    OSMOLALITY, CALCULATED 06/13/2022 278  270 - 290 mOsm/kg Final    CALCIUM, CORRECTED 06/13/2022 9.4  8.9 - 10.8 mg/dL Final    GLOBULIN 28/41/3244 3.3  2.9 - 5.4 Final    WBC 06/13/2022 11.3  3.8 - 11.8 x10^3/uL Final    RBC 06/13/2022 6.06 (H)  3.63 - 4.92 x10^6/uL Final    HGB 06/13/2022  18.1 (H)  10.9 - 14.3 g/dL Final    HCT 54/12/8117 54.1 (H)  31.2 - 41.9 % Final    MCV 06/13/2022 89.1  75.5 - 95.3 fL Final    MCH 06/13/2022 29.8  24.7 - 32.8 pg Final    MCHC 06/13/2022 33.4  32.3 - 35.6 g/dL Final    RDW 14/78/2956 17.7  12.3 - 17.7 % Final    PLATELETS 06/13/2022 173  140 - 440 x10^3/uL Final    MPV 06/13/2022 8.9  7.9 - 10.8 fL Final    NEUTROPHIL % 06/13/2022 63  43 - 77 % Final    LYMPHOCYTE % 06/13/2022 27  16 -  44 % Final    MONOCYTE % 06/13/2022 8  5 - 13 % Final    EOSINOPHIL % 06/13/2022 1  % Final    BASOPHIL % 06/13/2022 1  0 - 1 % Final    NEUTROPHIL # 06/13/2022 7.10  1.85 - 7.80 x10^3/uL Final    LYMPHOCYTE # 06/13/2022 3.10 (H)  1.00 - 3.00 x10^3/uL Final    MONOCYTE # 06/13/2022 0.90  0.30 - 1.00 x10^3/uL Final    EOSINOPHIL # 06/13/2022 0.10  0.00 - 0.50 x10^3/uL Final    BASOPHIL # 06/13/2022 0.10  0.00 - 0.10 x10^3/uL Final   Appointment on 05/28/2022   Component Date Value Ref Range Status    TSH 05/28/2022 5.079  0.450 - 5.330 uIU/mL Final    SODIUM 05/28/2022 140  136 - 145 mmol/L Final    POTASSIUM 05/28/2022 4.6  3.5 - 5.1 mmol/L Final    CHLORIDE 05/28/2022 102  98 - 107 mmol/L Final    CO2 TOTAL 05/28/2022 33 (H)  21 - 31 mmol/L Final    ANION GAP 05/28/2022 5  4 - 13 mmol/L Final    BUN 05/28/2022 10  7 - 25 mg/dL Final    CREATININE 21/30/8657 0.67  0.60 - 1.30 mg/dL Final    BUN/CREA RATIO 05/28/2022 15  6 - 22 Final    ESTIMATED GFR 05/28/2022 105  >59 mL/min/1.54m^2 Final    ALBUMIN 05/28/2022 4.0  3.5 - 5.7 g/dL Final    CALCIUM 84/69/6295 9.9  8.6 - 10.3 mg/dL Final    GLUCOSE 28/41/3244 118 (H)  74 - 109 mg/dL Final    ALKALINE PHOSPHATASE 05/28/2022 86  34 - 104 U/L Final    ALT (SGPT) 05/28/2022 388 (H)  7 - 52 U/L Final    AST (SGOT) 05/28/2022 271 (H)  13 - 39 U/L Final    BILIRUBIN TOTAL 05/28/2022 1.5 (H)  0.3 - 1.2 mg/dL Final    PROTEIN TOTAL 05/28/2022 7.5  6.4 - 8.9 g/dL Final    ALBUMIN/GLOBULIN RATIO 05/28/2022 1.1  0.8 - 1.4 Final    OSMOLALITY, CALCULATED 05/28/2022 280  270 - 290 mOsm/kg Final    CALCIUM, CORRECTED 05/28/2022 9.9  8.9 - 10.8 mg/dL Final    GLOBULIN 04/23/7251 3.5  2.9 - 5.4 Final   Telemedicine on 05/26/2022   Component Date Value Ref Range Status    REPORT SUMMARY 05/26/2022 NEGATIVE   Final    Negative for 81 out of 81 genes.  No known pathogenic or likely pathogenic variants were detected in the 81 genes analyzed. A Tyrer-Cuzick breast cancer risk assessment  was performed and a lifetime breast cancer risk was calculated to be  > = 20%    FOOTNOTES 05/26/2022 See Notes   Final    CLIA: ID #[id number]  Test  performed by [facility]. [address] [place], [place] [address]   J. Luana Shu, Ph.D., [facility], Laboratory Director   Appointment on 05/23/2022   Component Date Value Ref Range Status    SODIUM 05/23/2022 140  136 - 145 mmol/L Final    POTASSIUM 05/23/2022 4.2  3.5 - 5.1 mmol/L Final    CHLORIDE 05/23/2022 103  98 - 107 mmol/L Final    CO2 TOTAL 05/23/2022 30  21 - 31 mmol/L Final    ANION GAP 05/23/2022 7  4 - 13 mmol/L Final    BUN 05/23/2022 11  7 - 25 mg/dL Final    CREATININE 60/45/4098 0.74  0.60 - 1.30 mg/dL Final    BUN/CREA RATIO 05/23/2022 15  6 - 22 Final    ESTIMATED GFR 05/23/2022 97  >59 mL/min/1.42m^2 Final    ALBUMIN 05/23/2022 4.0  3.5 - 5.7 g/dL Final    CALCIUM 11/91/4782 9.8  8.6 - 10.3 mg/dL Final    GLUCOSE 95/62/1308 162 (H)  74 - 109 mg/dL Final    ALKALINE PHOSPHATASE 05/23/2022 84  34 - 104 U/L Final    ALT (SGPT) 05/23/2022 342 (H)  7 - 52 U/L Final    AST (SGOT) 05/23/2022 232 (H)  13 - 39 U/L Final    BILIRUBIN TOTAL 05/23/2022 1.1  0.3 - 1.2 mg/dL Final    PROTEIN TOTAL 05/23/2022 7.1  6.4 - 8.9 g/dL Final    ALBUMIN/GLOBULIN RATIO 05/23/2022 1.3  0.8 - 1.4 Final    OSMOLALITY, CALCULATED 05/23/2022 282  270 - 290 mOsm/kg Final    CALCIUM, CORRECTED 05/23/2022 9.8  8.9 - 10.8 mg/dL Final    GLOBULIN 65/78/4696 3.1  2.9 - 5.4 Final    WBC 05/23/2022 6.9  3.8 - 11.8 x10^3/uL Final    RBC 05/23/2022 5.91 (H)  3.63 - 4.92 x10^6/uL Final    HGB 05/23/2022 17.3 (H)  10.9 - 14.3 g/dL Final    HCT 29/52/8413 52.4 (H)  31.2 - 41.9 % Final    MCV 05/23/2022 88.7  75.5 - 95.3 fL Final    MCH 05/23/2022 29.3  24.7 - 32.8 pg Final    MCHC 05/23/2022 33.0  32.3 - 35.6 g/dL Final    RDW 24/40/1027 17.9 (H)  12.3 - 17.7 % Final    PLATELETS 05/23/2022 138 (L)  140 - 440 x10^3/uL Final    MPV 05/23/2022 8.3  7.9 - 10.8 fL Final     NEUTROPHIL % 05/23/2022 71  43 - 77 % Final    LYMPHOCYTE % 05/23/2022 20  16 - 44 % Final    MONOCYTE % 05/23/2022 8  5 - 13 % Final    EOSINOPHIL % 05/23/2022 2  % Final    BASOPHIL % 05/23/2022 1  0 - 1 % Final    NEUTROPHIL # 05/23/2022 4.90  1.85 - 7.80 x10^3/uL Final    LYMPHOCYTE # 05/23/2022 1.40  1.00 - 3.00 x10^3/uL Final    MONOCYTE # 05/23/2022 0.50  0.30 - 1.00 x10^3/uL Final    EOSINOPHIL # 05/23/2022 0.10  0.00 - 0.50 x10^3/uL Final    BASOPHIL # 05/23/2022 0.00  0.00 - 0.10 x10^3/uL Final   Hospital Outpatient Visit on 05/16/2022   Component Date Value Ref Range Status    SODIUM 05/16/2022 141  136 - 145 mmol/L Final    POTASSIUM 05/16/2022 3.5  3.5 - 5.1 mmol/L Final    CHLORIDE 05/16/2022 103  98 - 107 mmol/L  Final    CO2 TOTAL 05/16/2022 31  21 - 31 mmol/L Final    ANION GAP 05/16/2022 7  4 - 13 mmol/L Final    BUN 05/16/2022 6 (L)  7 - 25 mg/dL Final    CREATININE 47/82/9562 0.66  0.60 - 1.30 mg/dL Final    BUN/CREA RATIO 05/16/2022 9  6 - 22 Final    ESTIMATED GFR 05/16/2022 105  >59 mL/min/1.76m^2 Final    ALBUMIN 05/16/2022 3.9  3.5 - 5.7 g/dL Final    CALCIUM 13/11/6576 9.3  8.6 - 10.3 mg/dL Final    GLUCOSE 46/96/2952 115 (H)  74 - 109 mg/dL Final    ALKALINE PHOSPHATASE 05/16/2022 91  34 - 104 U/L Final    ALT (SGPT) 05/16/2022 243 (H)  7 - 52 U/L Final    AST (SGOT) 05/16/2022 135 (H)  13 - 39 U/L Final    BILIRUBIN TOTAL 05/16/2022 0.9  0.3 - 1.2 mg/dL Final    PROTEIN TOTAL 05/16/2022 7.0  6.4 - 8.9 g/dL Final    ALBUMIN/GLOBULIN RATIO 05/16/2022 1.3  0.8 - 1.4 Final    OSMOLALITY, CALCULATED 05/16/2022 280  270 - 290 mOsm/kg Final    CALCIUM, CORRECTED 05/16/2022 9.4  8.9 - 10.8 mg/dL Final    GLOBULIN 84/13/2440 3.1  2.9 - 5.4 Final    WBC 05/16/2022 5.4  3.8 - 11.8 x10^3/uL Final    RBC 05/16/2022 5.95 (H)  3.63 - 4.92 x10^6/uL Final    HGB 05/16/2022 17.4 (H)  10.9 - 14.3 g/dL Final    HCT 02/15/2535 52.5 (H)  31.2 - 41.9 % Final    MCV 05/16/2022 88.3  75.5 - 95.3 fL Final    MCH  05/16/2022 29.2  24.7 - 32.8 pg Final    MCHC 05/16/2022 33.1  32.3 - 35.6 g/dL Final    RDW 64/40/3474 17.3  12.3 - 17.7 % Final    PLATELETS 05/16/2022 143  140 - 440 x10^3/uL Final    MPV 05/16/2022 8.5  7.9 - 10.8 fL Final    NEUTROPHIL % 05/16/2022 57  43 - 77 % Final    LYMPHOCYTE % 05/16/2022 30  16 - 44 % Final    MONOCYTE % 05/16/2022 10  5 - 13 % Final    EOSINOPHIL % 05/16/2022 2  % Final    BASOPHIL % 05/16/2022 1  0 - 1 % Final    NEUTROPHIL # 05/16/2022 3.10  1.85 - 7.80 x10^3/uL Final    LYMPHOCYTE # 05/16/2022 1.60  1.00 - 3.00 x10^3/uL Final    MONOCYTE # 05/16/2022 0.60  0.30 - 1.00 x10^3/uL Final    EOSINOPHIL # 05/16/2022 0.10  0.00 - 0.50 x10^3/uL Final    BASOPHIL # 05/16/2022 0.00  0.00 - 0.10 x10^3/uL Final   Hospital Outpatient Visit on 04/18/2022   Component Date Value Ref Range Status    SODIUM 04/18/2022 136  136 - 145 mmol/L Final    POTASSIUM 04/18/2022 3.9  3.5 - 5.1 mmol/L Final    CHLORIDE 04/18/2022 102  98 - 107 mmol/L Final    CO2 TOTAL 04/18/2022 27  21 - 31 mmol/L Final    ANION GAP 04/18/2022 7  4 - 13 mmol/L Final    BUN 04/18/2022 9  7 - 25 mg/dL Final    CREATININE 25/95/6387 0.63  0.60 - 1.30 mg/dL Final    BUN/CREA RATIO 04/18/2022 14  6 - 22 Final    ESTIMATED GFR 04/18/2022 107  >  59 mL/min/1.58m^2 Final    ALBUMIN 04/18/2022 3.7  3.5 - 5.7 g/dL Final    CALCIUM 14/78/2956 9.2  8.6 - 10.3 mg/dL Final    GLUCOSE 21/30/8657 134 (H)  74 - 109 mg/dL Final    ALKALINE PHOSPHATASE 04/18/2022 170 (H)  34 - 104 U/L Final    ALT (SGPT) 04/18/2022 438 (H)  7 - 52 U/L Final    AST (SGOT) 04/18/2022 264 (H)  13 - 39 U/L Final    BILIRUBIN TOTAL 04/18/2022 3.3 (H)  0.3 - 1.2 mg/dL Final    PROTEIN TOTAL 04/18/2022 7.3  6.4 - 8.9 g/dL Final    ALBUMIN/GLOBULIN RATIO 04/18/2022 1.0  0.8 - 1.4 Final    OSMOLALITY, CALCULATED 04/18/2022 273  270 - 290 mOsm/kg Final    CALCIUM, CORRECTED 04/18/2022 9.4  8.9 - 10.8 mg/dL Final    GLOBULIN 84/69/6295 3.6  2.9 - 5.4 Final    WBC 04/18/2022  6.5  3.8 - 11.8 x10^3/uL Final    RBC 04/18/2022 5.96 (H)  3.63 - 4.92 x10^6/uL Final    HGB 04/18/2022 17.0 (H)  10.9 - 14.3 g/dL Final    HCT 28/41/3244 52.2 (H)  31.2 - 41.9 % Final    MCV 04/18/2022 87.5  75.5 - 95.3 fL Final    MCH 04/18/2022 28.6  24.7 - 32.8 pg Final    MCHC 04/18/2022 32.6  32.3 - 35.6 g/dL Final    RDW 04/23/7251 17.0  12.3 - 17.7 % Final    PLATELETS 04/18/2022 119 (L)  140 - 440 x10^3/uL Final    MPV 04/18/2022 9.6  7.9 - 10.8 fL Final    NEUTROPHIL % 04/18/2022 84 (H)  43 - 77 % Final    LYMPHOCYTE % 04/18/2022 7 (L)  16 - 44 % Final    MONOCYTE % 04/18/2022 7  5 - 13 % Final    EOSINOPHIL % 04/18/2022 2  % Final    BASOPHIL % 04/18/2022 0  0 - 1 % Final    NEUTROPHIL # 04/18/2022 5.50  1.85 - 7.80 x10^3/uL Final    LYMPHOCYTE # 04/18/2022 0.40 (L)  1.00 - 3.00 x10^3/uL Final    MONOCYTE # 04/18/2022 0.40  0.30 - 1.00 x10^3/uL Final    EOSINOPHIL # 04/18/2022 0.10  0.00 - 0.50 x10^3/uL Final    BASOPHIL # 04/18/2022 0.00  0.00 - 0.10 x10^3/uL Final   There may be more visits with results that are not included.        Health Maintenance:  Health Maintenance   Topic Date Due    Hepatitis C screening  Never done    Pneumococcal Vaccine, Age 35-64 (1 of 2 - PCV) Never done    HIV Screening  Never done    Adult Tdap-Td (1 - Tdap) Never done    Hepatitis B Vaccine (1 of 3 - 19+ 3-dose series) Never done    Shingles Vaccine (1 of 2) Never done    Pap smear/HPV  Never done    Colonoscopy  Never done    Mammography  Never done    Covid-19 Vaccine (4 - 2023-24 season) 12/20/2021    Depression Screening  11/08/2022    NonMedicare Preventative Exam  06/26/2023    Influenza Vaccine  Completed    Meningococcal Vaccine  Aged Out        Assessment & Plan   Problem List Items Addressed This Visit          Cardiovascular System  Primary hypertension - Primary (Chronic)     Condition is improving. Continue with valsartan and aldactone. Continue to monitor blood pressure at home and keep log.                   Follow up:  Return if symptoms worsen or fail to improve. Keep f/u as scheduled.     This note was partially created using voice recognition software and is inherently subject to errors including those of syntax and "sound alike " substitutions which may escape proof reading.  In such instances, original meaning may be extrapolated by contextual derivation.    Macario Golds, FNP-C  09/25/2022, 12:14

## 2022-09-29 ENCOUNTER — Encounter (INDEPENDENT_AMBULATORY_CARE_PROVIDER_SITE_OTHER): Payer: Self-pay | Admitting: HEMATOLOGY-ONCOLOGY

## 2022-09-29 ENCOUNTER — Inpatient Hospital Stay (INDEPENDENT_AMBULATORY_CARE_PROVIDER_SITE_OTHER): Admission: RE | Admit: 2022-09-29 | Payer: 59 | Source: Ambulatory Visit

## 2022-09-29 ENCOUNTER — Other Ambulatory Visit: Payer: Self-pay

## 2022-09-29 ENCOUNTER — Ambulatory Visit: Payer: 59 | Attending: HEMATOLOGY-ONCOLOGY | Admitting: HEMATOLOGY-ONCOLOGY

## 2022-09-29 VITALS — BP 137/84 | HR 77 | Temp 97.1°F | Ht 69.0 in | Wt 224.2 lb

## 2022-09-29 DIAGNOSIS — C649 Malignant neoplasm of unspecified kidney, except renal pelvis: Secondary | ICD-10-CM | POA: Insufficient documentation

## 2022-09-29 DIAGNOSIS — I823 Embolism and thrombosis of renal vein: Secondary | ICD-10-CM | POA: Insufficient documentation

## 2022-09-29 DIAGNOSIS — Z7901 Long term (current) use of anticoagulants: Secondary | ICD-10-CM | POA: Insufficient documentation

## 2022-09-29 DIAGNOSIS — R0600 Dyspnea, unspecified: Secondary | ICD-10-CM | POA: Insufficient documentation

## 2022-09-29 DIAGNOSIS — C642 Malignant neoplasm of left kidney, except renal pelvis: Secondary | ICD-10-CM | POA: Insufficient documentation

## 2022-09-29 NOTE — Progress Notes (Unsigned)
Department of Hematology/Oncology  History and Physical    Name: Taylor Smith  ZOX:[id number]  Date of Birth: 11/18/1970  Encounter Date: 09/29/2022    REFERRING PROVIDER:  No referring provider defined for this encounter.    REASON FOR OFFICE VISIT:  New patient for evaluation and management of stage IV clear cell renal carcinoma    HISTORY OF PRESENT ILLNESS:A  Taylor Smith is a 52 y.o. 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  She was originally diagnosed after having a biopsy in the genitourinary area.  It came back consistent with clear cell renal cell carcinoma, which prompted an imaging study of the chest, abdomen, and pelvis.  She was found to have a massive left kidney mass with numerous small pulmonary nodules.  The renal mass appears to be involving the renal vein but not the inferior vena cava.    Prior to this, she was not having any symptoms.  Specifically, she did not have any hematuria or history of iron-deficiency anemia.  She does report back pain which has been going on for the past year but it is unclear whether that was related or not.    02/07/2022: The patient is here for follow up of stage IV kidney cancer.  She states that she tolerated the 1st treatment well.  She did have some achiness of the legs which was pretty brief in nature.    02/28/2022: The patient is here for follow up of stage IV kidney cancer.  She states that she has some pain in her legs for a few days following treatment.  She also reports that she is had a cough which has been present since her most recent surgery.  Today will be her 3rd cycle of treatment, and she has a CT scan scheduled for December 20.    03/21/2022:  The patient is here for follow up of metastatic renal cell carcinoma.  She was due for day 1 of cycle 4 of combination ipilimumab and nivolumab.  She was having some tolerability issues with the combination, but is  receiving additional premedications and states that it is helping to make the treatments easier.  She does not have any new issues at this time.    04/11/2022: The patient is here for follow up of metastatic renal cell cancer.  She has been on treatment with combination immunotherapy.  At last contact, her transaminases were mildly elevated.  On this visit, her bilirubin and transaminases are markedly higher.  She states that she did see a surgeon yesterday for consideration of nephrectomy.  She indicates that there was not a discussion regarding a jaundiced appearance at that time.  She had CT imaging performed approximately 2 days ago.    04/18/2022: The patient is here for follow up of metastatic renal cell carcinoma.  Immunotherapy has been on hold due to adverse liver function testing.  She has been on prednisone in her numbers have improved considerably.  She did go to the emergency room for an episode of abdominal pain recently and was prescribed Bentyl.    06/13/2022:  She states that she recently received Inlyta, and has not been on it for long.  She has a follow up visit with [facility] in early April.  She is having more issues with back pain, primarily when laying down.  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    07/11/2022: The patient is here for follow up of metastatic renal cell carcinoma.  She states that she will be having follow up as due on April 17, and a CT scan will be performed around that time.  She states that there is some discussion about potentially doing surgery at some point if possible.    08/15/2022: The patient is here for follow up of metastatic renal cell carcinoma.  She reports some shortness of breath with exertion.  She has had prior CT angiogram which have not shown any evidence of PE, and she was recently assessed at [facility] last week.  She does not have any significant anemia.  Her most  recent imaging study indicates that her cancer is decreasing in size.    09/29/2022: The patient is here for follow up renal cell carcinoma.  She states that she has discontinued the Inlyta in anticipation of surgery being performed next week.    ROS:   Review of Systems   Constitutional:  Negative for appetite change, chills and fatigue.   HENT:   Negative for sore throat and trouble swallowing.    Eyes:  Negative for eye problems.   Respiratory:  Negative for cough and shortness of breath.    Cardiovascular:  Negative for chest pain and leg swelling.   Gastrointestinal:  Negative for abdominal pain.   Genitourinary:  Negative for dysuria and hematuria.    Musculoskeletal:  Negative for arthralgias and gait problem.   Skin:  Negative for rash.   Neurological:  Negative for gait problem.   Hematological:  Negative for adenopathy.   Psychiatric/Behavioral:  Negative for depression.         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)     Kidney cancer, primary, with metastasis from kidney to other site, left (CMS [facility])          Past Surgical History:   Procedure Laterality Date    HX CESAREAN SECTION      HX CHOLECYSTECTOMY      HX CYST REMOVAL Right     HX HYSTERECTOMY      TOTAL THYROIDECTOMY           Social History     Socioeconomic History    Marital status: Married     Spouse name: Not on file    Number of children: Not on file    Years of education: Not on file    Highest education level: Not on file   Occupational History    Not on file   Tobacco Use    Smoking status: Never    Smokeless tobacco: Never   Vaping Use    Vaping status: Never Used   Substance and Sexual Activity    Alcohol use: Not on file    Drug use: Never    Sexual activity: Yes   Other Topics Concern    Not on file   Social History Narrative    Not on file     Social Determinants  of Health     Financial Resource Strain: Not on file   Transportation Needs: Not on file   Social Connections: Not on file   Intimate Partner Violence: High Risk (01/13/2022)    Intimate Partner Violence     SDOH Domestic Violence: No  Housing Stability: Not on file     Family Medical History:       Problem Relation (Age of Onset)    Cancer Maternal Aunt, Maternal Grandmother    Colon Cancer Maternal Uncle    Lung Cancer Mother    Melanoma Maternal Aunt            Current Outpatient Medications   Medication Sig    apixaban (ELIQUIS) 5 mg Oral Tablet Take 1 Tablet (5 mg total) by mouth Twice daily    axitinib (INLYTA) 5 mg Oral Tablet Take 1 Tablet (5 mg total) by mouth Twice daily (Patient taking differently: Take 1 Tablet (5 mg total) by mouth Twice daily Pt currently not taking due to upcoming surgery.)    ergocalciferol, vitamin D2, (DRISDOL) 1,250 mcg (50,000 unit) Oral Capsule Take 1 Capsule (50,000 Units total) by mouth Every 7 days    hydroCHLOROthiazide (MICROZIDE) 12.5 mg Oral Capsule Take 1 Capsule (12.5 mg total) by mouth Once a day Indications: high blood pressure (Patient taking differently: Take 1 Capsule (12.5 mg total) by mouth Once a day)    Ibuprofen (MOTRIN) 600 mg Oral Tablet Take 1 Tablet (600 mg total) by mouth Every 6 hours as needed    levothyroxine (SYNTHROID) 125 mcg Oral Tablet Take 1 Tablet (125 mcg total) by mouth Every morning    omeprazole (PRILOSEC) 40 mg Oral Capsule, Delayed Release(E.C.) Take 1 Capsule (40 mg total) by mouth Once a day    valsartan (DIOVAN) 320 mg Oral Tablet Take 0.5 Tablets (160 mg total) by mouth Once a day     Allergies   Allergen Reactions    Aldactone [Spironolactone] Itching       PHYSICAL EXAM:  Most Recent Vitals    Flowsheet Row Telemedicine from 01/13/2022 in Hematology/Oncology,   [facility]   Temperature 36.7 C (98.1 F) filed at... 01/13/2022 1431   Heart Rate 72 filed at... 01/13/2022 1431   Respiratory Rate --   BP (Non-Invasive)  146/73 filed at... 01/13/2022 1431   SpO2 97 % filed at... 01/13/2022 1431   Height 1.753 m (5\' 9" ) filed at... 01/13/2022 1431   Weight 94.2 kg (207 lb 9.6 oz) filed at... 01/13/2022 1431   BMI (Calculated) 30.72 filed at... 01/13/2022 1431   BSA (Calculated) 2.14 filed at... 01/13/2022 1431      ECOG Status: (0) Fully active, able to carry on all predisease performance without restriction   Physical Exam  Constitutional:       General: She is not in acute distress.     Appearance: Normal appearance.   Eyes:      Extraocular Movements: Extraocular movements intact.   Cardiovascular:      Rate and Rhythm: Normal rate and regular rhythm.   Pulmonary:      Effort: Pulmonary effort is normal.   Abdominal:      General: Abdomen is flat.      Palpations: Abdomen is soft.   Musculoskeletal:         General: Normal range of motion.      Cervical back: Normal range of motion.   Skin:     General: Skin is warm and dry.   Neurological:      General: No focal deficit present.      Mental Status: She is alert.   Psychiatric:         Mood and Affect: Mood normal.         DIAGNOSTIC DATA:  No results  found for this or any previous visit (from the past 81191 hour(s)).    LABS:   CBC  Diff   Lab Results   Component Value Date/Time    WBC 5.4 08/25/2022 09:17 PM    HGB 17.2 (H) 08/25/2022 09:17 PM    HCT 50.9 (H) 08/25/2022 09:17 PM    PLTCNT 169 08/25/2022 09:17 PM    RBC 5.27 08/25/2022 09:17 PM    MCV 96.5 08/25/2022 09:17 PM    MCHC 33.7 08/25/2022 09:17 PM    MCH 32.6 (H) 08/25/2022 09:17 PM    RDW 15.9 (H) 08/25/2022 09:17 PM    MPV 9.1 08/25/2022 09:17 PM    Lab Results   Component Value Date/Time    PMNS 55 08/25/2022 09:17 PM    LYMPHOCYTES 32 08/25/2022 09:17 PM    EOSINOPHIL 4 08/25/2022 09:17 PM    MONOCYTES 9 08/25/2022 09:17 PM    BASOPHILS 0 08/25/2022 09:17 PM    BASOPHILS 0.00 08/25/2022 09:17 PM    PMNABS 2.97 08/25/2022 09:17 PM    LYMPHSABS 1.71 08/25/2022 09:17 PM    EOSABS 0.24 08/25/2022 09:17 PM    MONOSABS  0.46 08/25/2022 09:17 PM            ASSESSMENT:    ICD-10-CM    1. Kidney cancer, primary, with metastasis from kidney to other site, left (CMS HCC)  C64.2            PLAN:   1. All relevant medical records were reviewed including available pertinent provider notes, procedure notes, imaging, laboratory, and pathology.   2. All pertinent labs and/or imaging were reviewed with the patient.   3. Stage IV renal carcinoma:  Inlyta is on hold.  She has surgery scheduled for next week, and I will see her back in 6 weeks to go over the results and make a determination as to any decisions about therapy going forward.  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  5. Pain:  Improved.    6. Dyspnea:  Likely secondary to the medication.  She has been evaluated for PE and should have adequate pulmonary reserve and she does not have anemia.    Taylor Smith was given the chance to ask questions, and these were answered to their satisfaction. The patient is welcome to call with any questions or concerns in the meantime.     On the day of the encounter, a total of 35 minutes was spent on this patient encounter including review of historical information, examination, documentation and post-visit activities.   Return in about 6 weeks (around 11/10/2022).     Lupita Dawn, MD  09/29/2022, 09:45  The patient's insurance company bears full legal and financial responsibility resulting from any deviations that they cause to my recommended treatment plan.   CC:  Macario Golds, FNP-C  [address]  [place] [address]    No referring provider defined for this encounter.    This note was partially generated using MModal Fluency Direct system, and there may be some incorrect words, spellings, and punctuation that were not noted in checking the note before saving.

## 2022-10-04 ENCOUNTER — Other Ambulatory Visit (HOSPITAL_COMMUNITY): Payer: Self-pay | Admitting: HEMATOLOGY-ONCOLOGY

## 2022-10-06 ENCOUNTER — Telehealth (INDEPENDENT_AMBULATORY_CARE_PROVIDER_SITE_OTHER): Payer: Self-pay | Admitting: HEMATOLOGY-ONCOLOGY

## 2022-10-06 NOTE — Telephone Encounter (Signed)
Called [facility] pharmacy to inform them that patient had stopped taking prednisone per Dr. Damita Lack.

## 2022-10-06 NOTE — Telephone Encounter (Signed)
Patient no longer taking medication per provider.

## 2022-10-08 ENCOUNTER — Encounter (INDEPENDENT_AMBULATORY_CARE_PROVIDER_SITE_OTHER): Payer: Self-pay | Admitting: HEMATOLOGY-ONCOLOGY

## 2022-10-29 ENCOUNTER — Encounter (INDEPENDENT_AMBULATORY_CARE_PROVIDER_SITE_OTHER): Payer: Self-pay | Admitting: NURSE PRACTITIONER

## 2022-10-29 ENCOUNTER — Other Ambulatory Visit (INDEPENDENT_AMBULATORY_CARE_PROVIDER_SITE_OTHER): Payer: Self-pay | Admitting: NURSE PRACTITIONER

## 2022-10-29 ENCOUNTER — Other Ambulatory Visit: Payer: Self-pay

## 2022-10-29 ENCOUNTER — Other Ambulatory Visit: Payer: 59 | Attending: NURSE PRACTITIONER

## 2022-10-29 DIAGNOSIS — E559 Vitamin D deficiency, unspecified: Secondary | ICD-10-CM | POA: Insufficient documentation

## 2022-10-29 DIAGNOSIS — I10 Essential (primary) hypertension: Secondary | ICD-10-CM

## 2022-10-29 DIAGNOSIS — Z13220 Encounter for screening for lipoid disorders: Secondary | ICD-10-CM | POA: Insufficient documentation

## 2022-10-29 DIAGNOSIS — Z0000 Encounter for general adult medical examination without abnormal findings: Secondary | ICD-10-CM | POA: Insufficient documentation

## 2022-10-29 DIAGNOSIS — E039 Hypothyroidism, unspecified: Secondary | ICD-10-CM

## 2022-10-29 DIAGNOSIS — Z136 Encounter for screening for cardiovascular disorders: Secondary | ICD-10-CM | POA: Insufficient documentation

## 2022-10-29 LAB — THYROID STIMULATING HORMONE WITH FREE T4 REFLEX: TSH: 6.411 u[IU]/mL — ABNORMAL HIGH (ref 0.450–5.330)

## 2022-10-29 LAB — THYROID STIMULATING HORMONE (SENSITIVE TSH): TSH: 6.411 u[IU]/mL — ABNORMAL HIGH (ref 0.450–5.330)

## 2022-10-29 LAB — LIPID PANEL
CHOL/HDL RATIO: 4.2
CHOLESTEROL: 171 mg/dL (ref <200)
HDL CHOL: 41 mg/dL (ref >=40)
LDL CALC: 107 mg/dL — ABNORMAL HIGH (ref 0–100)
TRIGLYCERIDES: 114 mg/dL (ref <=150)
VLDL CALC: 23 mg/dL (ref 0–50)

## 2022-10-29 LAB — VITAMIN D 25 TOTAL: VITAMIN D: 29 ng/mL — ABNORMAL LOW (ref 30–100)

## 2022-10-29 MED ORDER — LEVOTHYROXINE 137 MCG TABLET
137.00 ug | ORAL_TABLET | Freq: Every morning | ORAL | 0 refills | Status: AC
Start: 2022-10-29 — End: ?

## 2022-10-29 MED ORDER — VALSARTAN 320 MG TABLET
320.0000 mg | ORAL_TABLET | Freq: Every day | ORAL | 0 refills | Status: DC
Start: 2022-10-29 — End: 2022-11-27

## 2022-11-07 ENCOUNTER — Other Ambulatory Visit (INDEPENDENT_AMBULATORY_CARE_PROVIDER_SITE_OTHER): Payer: Self-pay | Admitting: NURSE PRACTITIONER

## 2022-11-10 ENCOUNTER — Encounter (INDEPENDENT_AMBULATORY_CARE_PROVIDER_SITE_OTHER): Payer: Self-pay | Admitting: HEMATOLOGY-ONCOLOGY

## 2022-11-10 ENCOUNTER — Other Ambulatory Visit: Payer: Self-pay

## 2022-11-10 ENCOUNTER — Ambulatory Visit: Payer: 59 | Attending: HEMATOLOGY-ONCOLOGY | Admitting: HEMATOLOGY-ONCOLOGY

## 2022-11-10 ENCOUNTER — Inpatient Hospital Stay (INDEPENDENT_AMBULATORY_CARE_PROVIDER_SITE_OTHER): Admission: RE | Admit: 2022-11-10 | Payer: 59 | Source: Ambulatory Visit

## 2022-11-10 VITALS — BP 133/95 | HR 74 | Temp 98.4°F | Ht 69.0 in | Wt 217.4 lb

## 2022-11-10 DIAGNOSIS — Z7901 Long term (current) use of anticoagulants: Secondary | ICD-10-CM | POA: Insufficient documentation

## 2022-11-10 DIAGNOSIS — I823 Embolism and thrombosis of renal vein: Secondary | ICD-10-CM | POA: Insufficient documentation

## 2022-11-10 DIAGNOSIS — Z85528 Personal history of other malignant neoplasm of kidney: Secondary | ICD-10-CM | POA: Insufficient documentation

## 2022-11-10 DIAGNOSIS — Z08 Encounter for follow-up examination after completed treatment for malignant neoplasm: Secondary | ICD-10-CM | POA: Insufficient documentation

## 2022-11-10 DIAGNOSIS — C642 Malignant neoplasm of left kidney, except renal pelvis: Secondary | ICD-10-CM

## 2022-11-10 DIAGNOSIS — R52 Pain, unspecified: Secondary | ICD-10-CM | POA: Insufficient documentation

## 2022-11-10 NOTE — Progress Notes (Signed)
Department of Hematology/Oncology  History and Physical    Name: Taylor Smith  ZOX:[id number]  Date of Birth: 1970/05/31  Encounter Date: 11/10/2022    REFERRING PROVIDER:  Macario Golds, FNP-C  [address]  [place],  [place] [address]    REASON FOR OFFICE VISIT:  New patient for evaluation and management of stage IV clear cell renal carcinoma    HISTORY OF PRESENT ILLNESS:Taylor  Taylor Smith is Taylor 52 y.o. 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  She was originally diagnosed after having Taylor biopsy in the genitourinary area.  It came back consistent with clear cell renal cell carcinoma, which prompted an imaging study of the chest, abdomen, and pelvis.  She was found to have Taylor massive left kidney mass with numerous small pulmonary nodules.  The renal mass appears to be involving the renal vein but not the inferior vena cava.    Prior to this, she was not having any symptoms.  Specifically, she did not have any hematuria or history of iron-deficiency anemia.  She does report back pain which has been going on for the past year but it is unclear whether that was related or not.    02/07/2022: The patient is here for follow up of stage IV kidney cancer.  She states that she tolerated the 1st treatment well.  She did have some achiness of the legs which was pretty brief in nature.    02/28/2022: The patient is here for follow up of stage IV kidney cancer.  She states that she has some pain in her legs for Taylor few days following treatment.  She also reports that she is had Taylor cough which has been present since her most recent surgery.  Today will be her 3rd cycle of treatment, and she has Taylor CT scan scheduled for December 20.    03/21/2022:  The patient is here for follow up of metastatic renal cell carcinoma.  She was due for day 1 of cycle 4 of combination ipilimumab and nivolumab.  She was having some tolerability issues with the combination, but  is receiving additional premedications and states that it is helping to make the treatments easier.  She does not have any new issues at this time.    04/11/2022: The patient is here for follow up of metastatic renal cell cancer.  She has been on treatment with combination immunotherapy.  At last contact, her transaminases were mildly elevated.  On this visit, her bilirubin and transaminases are markedly higher.  She states that she did see Taylor surgeon yesterday for consideration of nephrectomy.  She indicates that there was not Taylor discussion regarding Taylor jaundiced appearance at that time.  She had CT imaging performed approximately 2 days ago.    04/18/2022: The patient is here for follow up of metastatic renal cell carcinoma.  Immunotherapy has been on hold due to adverse liver function testing.  She has been on prednisone in her numbers have improved considerably.  She did go to the emergency room for an episode of abdominal pain recently and was prescribed Bentyl.    06/13/2022:  She states that she recently received Inlyta, and has not been on it for long.  She has Taylor follow up visit with [facility] in early April.  She is having more issues with back pain, primarily when laying down.  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    07/11/2022: The patient is here for follow up of metastatic renal cell carcinoma.  She states that she will be having follow up as due on April 17, and Taylor CT scan will be performed around that time.  She states that there is some discussion about potentially doing surgery at some point if possible.    08/15/2022: The patient is here for follow up of metastatic renal cell carcinoma.  She reports some shortness of breath with exertion.  She has had prior CT angiogram which have not shown any evidence of PE, and she was recently assessed at [facility] last week.  She does not have any significant anemia.  Her most  recent imaging study indicates that her cancer is decreasing in size.    09/29/2022: The patient is here for follow up renal cell carcinoma.  She states that she has discontinued the Inlyta in anticipation of surgery being performed next week.    11/10/2022:  The patient is here for follow up of renal cell carcinoma.  Since the last visit, she ended up having resection of the lesion.  Clear margins were obtained, and 5 lymph nodes were negative for involvement.  She also had no evidence of any lymphovascular invasion at the time.    ROS:   Review of Systems   Constitutional:  Negative for appetite change, chills and fatigue.   HENT:   Negative for sore throat and trouble swallowing.    Eyes:  Negative for eye problems.   Respiratory:  Negative for cough and shortness of breath.    Cardiovascular:  Negative for chest pain and leg swelling.   Gastrointestinal:  Negative for abdominal pain.   Genitourinary:  Negative for dysuria and hematuria.    Musculoskeletal:  Negative for arthralgias and gait problem.   Skin:  Negative for rash.   Neurological:  Negative for gait problem.   Hematological:  Negative for adenopathy.   Psychiatric/Behavioral:  Negative for depression.         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)     Kidney cancer, primary, with metastasis from kidney to other site, left (CMS [facility])          Past Surgical History:   Procedure Laterality Date    HX CESAREAN SECTION      HX CHOLECYSTECTOMY      HX CYST REMOVAL Right     HX HYSTERECTOMY      NEPHROURETERECTOMY Left     TOTAL THYROIDECTOMY           Social History     Socioeconomic History    Marital status: Married     Spouse name: Not on file    Number of children: Not on file    Years of education: Not on file    Highest education level: Not on file   Occupational History    Not on  file   Tobacco Use    Smoking status: Never    Smokeless tobacco: Never   Vaping Use    Vaping status: Never Used   Substance and Sexual Activity    Alcohol use: Not on file    Drug use: Never    Sexual activity: Yes   Other Topics Concern    Not  on file   Social History Narrative    Not on file     Social Determinants of Health     Financial Resource Strain: Not on file   Transportation Needs: No Transportation Needs (10/10/2022)    Received from [facility]    PRAPARE - Transportation     In the past 12 months, has lack of transportation kept you from medical appointments or from getting medications?: No     Lack of Transportation (Non-Medical): No   Social Connections: Not on file   Intimate Partner Violence: High Risk (01/13/2022)    Intimate Partner Violence     SDOH Domestic Violence: No   Housing Stability: Not on file     Family Medical History:       Problem Relation (Age of Onset)    Cancer Maternal Aunt, Maternal Grandmother    Colon Cancer Maternal Uncle    Lung Cancer Mother    Melanoma Maternal Aunt            Current Outpatient Medications   Medication Sig    axitinib (INLYTA) 5 mg Oral Tablet Take 1 Tablet (5 mg total) by mouth Twice daily (Patient taking differently: Take 1 Tablet (5 mg total) by mouth Twice daily Pt currently not taking due to upcoming surgery.)    ergocalciferol, vitamin D2, (DRISDOL) 1,250 mcg (50,000 unit) Oral Capsule Take 1 Capsule (50,000 Units total) by mouth Every 7 days    hydroCHLOROthiazide (MICROZIDE) 12.5 mg Oral Capsule TAKE 1 CAPSULE (12.5 MG TOTAL) BY MOUTH ONCE Taylor DAY INDICATIONS: HIGH BLOOD PRESSURE    Ibuprofen (MOTRIN) 600 mg Oral Tablet Take 1 Tablet (600 mg total) by mouth Every 6 hours as needed    levothyroxine (SYNTHROID) 125 mcg Oral Tablet Take 1 Tablet (125 mcg total) by mouth Every morning    levothyroxine (SYNTHROID) 137 mcg Oral Tablet Take 1 Tablet (137 mcg total) by mouth Every morning    omeprazole (PRILOSEC) 40 mg Oral Capsule,  Delayed Release(E.C.) Take 1 Capsule (40 mg total) by mouth Once Taylor day    valsartan (DIOVAN) 320 mg Oral Tablet Take 1 Tablet (320 mg total) by mouth Once Taylor day (Patient taking differently: Take 40 mg by mouth Once Taylor day)     Allergies   Allergen Reactions    Aldactone [Spironolactone] Itching       PHYSICAL EXAM:  Most Recent Vitals    Flowsheet Row Telemedicine from 01/13/2022 in Hematology/Oncology,   [facility]   Temperature 36.7 C (98.1 F) filed at... 01/13/2022 1431   Heart Rate 72 filed at... 01/13/2022 1431   Respiratory Rate --   BP (Non-Invasive) 146/73 filed at... 01/13/2022 1431   SpO2 97 % filed at... 01/13/2022 1431   Height 1.753 m (5\' 9" ) filed at... 01/13/2022 1431   Weight 94.2 kg (207 lb 9.6 oz) filed at... 01/13/2022 1431   BMI (Calculated) 30.72 filed at... 01/13/2022 1431   BSA (Calculated) 2.14 filed at... 01/13/2022 1431      ECOG Status: (0) Fully active, able to carry on all predisease performance without restriction   Physical Exam  Constitutional:       General: She is not in acute distress.     Appearance: Normal appearance.   Eyes:      Extraocular Movements: Extraocular movements intact.   Cardiovascular:      Rate and Rhythm: Normal rate and regular rhythm.   Pulmonary:      Effort: Pulmonary effort is  normal.   Abdominal:      General: Abdomen is flat.      Palpations: Abdomen is soft.   Musculoskeletal:         General: Normal range of motion.      Cervical back: Normal range of motion.   Skin:     General: Skin is warm and dry.   Neurological:      General: No focal deficit present.      Mental Status: She is alert.   Psychiatric:         Mood and Affect: Mood normal.         DIAGNOSTIC DATA:  No results found for this or any previous visit (from the past 13086 hour(s)).    LABS:   CBC  Diff   Lab Results   Component Value Date/Time    WBC 5.4 08/25/2022 09:17 PM    HGB 17.2 (H) 08/25/2022 09:17 PM    HCT 50.9 (H) 08/25/2022 09:17 PM    PLTCNT 169 08/25/2022  09:17 PM    RBC 5.27 08/25/2022 09:17 PM    MCV 96.5 08/25/2022 09:17 PM    MCHC 33.7 08/25/2022 09:17 PM    MCH 32.6 (H) 08/25/2022 09:17 PM    RDW 15.9 (H) 08/25/2022 09:17 PM    MPV 9.1 08/25/2022 09:17 PM    Lab Results   Component Value Date/Time    PMNS 55 08/25/2022 09:17 PM    LYMPHOCYTES 32 08/25/2022 09:17 PM    EOSINOPHIL 4 08/25/2022 09:17 PM    MONOCYTES 9 08/25/2022 09:17 PM    BASOPHILS 0 08/25/2022 09:17 PM    BASOPHILS 0.00 08/25/2022 09:17 PM    PMNABS 2.97 08/25/2022 09:17 PM    LYMPHSABS 1.71 08/25/2022 09:17 PM    EOSABS 0.24 08/25/2022 09:17 PM    MONOSABS 0.46 08/25/2022 09:17 PM            ASSESSMENT:    ICD-10-CM    1. Kidney cancer, primary, with metastasis from kidney to other site, left (CMS HCC)  C64.2            PLAN:   1. All relevant medical records were reviewed including available pertinent provider notes, procedure notes, imaging, laboratory, and pathology.   2. All pertinent labs and/or imaging were reviewed with the patient.   3. Stage IV renal carcinoma (downstaged to stage III):  She had Taylor fantastic response to neoadjuvant immunotherapy.  She was downstaged stage III, and appears to be NED at this time.  We discussed at this point it would make sense to utilize adjuvant pembrolizumab, so arrangements will be made for that to be done every 3 weeks.  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  5. Pain:  Improved.      Taylor Smith was given the chance to ask questions, and these were answered to their satisfaction. The patient is welcome to call with any questions or concerns in the meantime.     On the day of the encounter, Taylor total of 35 minutes was spent on this patient encounter including review of historical information, examination, documentation and post-visit activities.   Return in about 4 weeks (around 12/08/2022).     Lupita Dawn, MD  11/10/2022, 11:56  The patient's insurance company bears full legal and financial responsibility resulting from any deviations that  they cause to my recommended treatment plan.   CC:  Macario Golds, FNP-C  [address]  [place] [address]  Macario Golds, FNP-C  [address]  [place],  [place] [address]    This note was partially generated using MModal Fluency Direct system, and there may be some incorrect words, spellings, and punctuation that were not noted in checking the note before saving.

## 2022-11-12 ENCOUNTER — Encounter (INDEPENDENT_AMBULATORY_CARE_PROVIDER_SITE_OTHER): Payer: Self-pay

## 2022-11-12 ENCOUNTER — Encounter (INDEPENDENT_AMBULATORY_CARE_PROVIDER_SITE_OTHER): Payer: Self-pay | Admitting: HEMATOLOGY-ONCOLOGY

## 2022-11-12 DIAGNOSIS — R0602 Shortness of breath: Secondary | ICD-10-CM

## 2022-11-13 ENCOUNTER — Inpatient Hospital Stay
Admission: RE | Admit: 2022-11-13 | Discharge: 2022-11-13 | Disposition: A | Discharge status: Home / Self Care | Payer: 59 | Source: Ambulatory Visit | Attending: HEMATOLOGY-ONCOLOGY | Admitting: HEMATOLOGY-ONCOLOGY

## 2022-11-13 ENCOUNTER — Other Ambulatory Visit: Payer: Self-pay

## 2022-11-13 DIAGNOSIS — R0602 Shortness of breath: Secondary | ICD-10-CM | POA: Insufficient documentation

## 2022-11-13 LAB — CBC WITH DIFF
BASOPHIL #: 0 10*3/uL (ref 0.00–0.10)
BASOPHIL %: 1 % (ref 0–1)
EOSINOPHIL #: 0.3 10*3/uL (ref 0.00–0.50)
EOSINOPHIL %: 6 %
HCT: 44.4 % — ABNORMAL HIGH (ref 31.2–41.9)
HGB: 15 g/dL — ABNORMAL HIGH (ref 10.9–14.3)
LYMPHOCYTE #: 1.3 10*3/uL (ref 1.00–3.00)
LYMPHOCYTE %: 27 % (ref 16–44)
MCH: 30.7 pg (ref 24.7–32.8)
MCHC: 33.9 g/dL (ref 32.3–35.6)
MCV: 90.7 fL (ref 75.5–95.3)
MONOCYTE #: 0.5 10*3/uL (ref 0.30–1.00)
MONOCYTE %: 10 % (ref 5–13)
MPV: 9.9 fL (ref 7.9–10.8)
NEUTROPHIL #: 2.6 10*3/uL (ref 1.85–7.80)
NEUTROPHIL %: 56 % (ref 43–77)
PLATELETS: 162 10*3/uL (ref 140–440)
RBC: 4.89 10*6/uL (ref 3.63–4.92)
RDW: 12.4 % (ref 12.3–17.7)
WBC: 4.7 10*3/uL (ref 3.8–11.8)

## 2022-11-19 ENCOUNTER — Ambulatory Visit
Admission: RE | Admit: 2022-11-19 | Discharge: 2022-11-19 | Disposition: A | Discharge status: Home / Self Care | Payer: 59 | Source: Ambulatory Visit | Attending: HEMATOLOGY-ONCOLOGY | Admitting: HEMATOLOGY-ONCOLOGY

## 2022-11-19 ENCOUNTER — Encounter (HOSPITAL_COMMUNITY): Payer: Self-pay

## 2022-11-19 ENCOUNTER — Other Ambulatory Visit: Payer: Self-pay

## 2022-11-19 ENCOUNTER — Other Ambulatory Visit (INDEPENDENT_AMBULATORY_CARE_PROVIDER_SITE_OTHER): Payer: Self-pay | Admitting: HEMATOLOGY-ONCOLOGY

## 2022-11-19 VITALS — BP 132/90 | HR 75 | Temp 98.6°F | Resp 18 | Ht 69.0 in | Wt 216.4 lb

## 2022-11-19 DIAGNOSIS — Z5112 Encounter for antineoplastic immunotherapy: Secondary | ICD-10-CM | POA: Insufficient documentation

## 2022-11-19 DIAGNOSIS — C642 Malignant neoplasm of left kidney, except renal pelvis: Secondary | ICD-10-CM | POA: Insufficient documentation

## 2022-11-19 LAB — CBC WITH DIFF
BASOPHIL #: 0 10*3/uL (ref 0.00–0.10)
BASOPHIL %: 1 % (ref 0–1)
EOSINOPHIL #: 0.2 10*3/uL (ref 0.00–0.50)
EOSINOPHIL %: 5 % (ref 1–7)
HCT: 44.4 % — ABNORMAL HIGH (ref 31.2–41.9)
HGB: 15.2 g/dL — ABNORMAL HIGH (ref 10.9–14.3)
LYMPHOCYTE #: 1.3 10*3/uL (ref 1.00–3.00)
LYMPHOCYTE %: 29 % (ref 16–44)
MCH: 30.6 pg (ref 24.7–32.8)
MCHC: 34.2 g/dL (ref 32.3–35.6)
MCV: 89.7 fL (ref 75.5–95.3)
MONOCYTE #: 0.4 10*3/uL (ref 0.30–1.00)
MONOCYTE %: 10 % (ref 5–13)
MPV: 10.3 fL (ref 7.9–10.8)
NEUTROPHIL #: 2.5 10*3/uL (ref 1.85–7.80)
NEUTROPHIL %: 56 % (ref 43–77)
PLATELETS: 155 10*3/uL (ref 140–440)
RBC: 4.95 10*6/uL — ABNORMAL HIGH (ref 3.63–4.92)
RDW: 12.4 % (ref 12.3–17.7)
WBC: 4.5 10*3/uL (ref 3.8–11.8)

## 2022-11-19 LAB — COMPREHENSIVE METABOLIC PANEL, NON-FASTING
ALBUMIN/GLOBULIN RATIO: 1.4 (ref 0.8–1.4)
ALBUMIN: 4.5 g/dL (ref 3.5–5.7)
ALKALINE PHOSPHATASE: 59 U/L (ref 34–104)
ALT (SGPT): 42 U/L (ref 7–52)
ANION GAP: 7 mmol/L (ref 4–13)
AST (SGOT): 35 U/L (ref 13–39)
BILIRUBIN TOTAL: 0.8 mg/dL (ref 0.3–1.0)
BUN/CREA RATIO: 14 (ref 6–22)
BUN: 14 mg/dL (ref 7–25)
CALCIUM, CORRECTED: 9.4 mg/dL (ref 8.9–10.8)
CALCIUM: 9.8 mg/dL (ref 8.6–10.3)
CHLORIDE: 107 mmol/L (ref 98–107)
CO2 TOTAL: 25 mmol/L (ref 21–31)
CREATININE: 0.97 mg/dL (ref 0.60–1.30)
ESTIMATED GFR: 70 mL/min/{1.73_m2} (ref >59)
GLOBULIN: 3.3 (ref 2.9–5.4)
GLUCOSE: 87 mg/dL (ref 74–109)
OSMOLALITY, CALCULATED: 277 mOsm/kg (ref 270–290)
POTASSIUM: 3.7 mmol/L (ref 3.5–5.1)
PROTEIN TOTAL: 7.8 g/dL (ref 6.4–8.9)
SODIUM: 139 mmol/L (ref 136–145)

## 2022-11-19 LAB — THYROXINE, FREE (FREE T4): THYROXINE (T4), FREE: 1.32 ng/dL (ref 0.58–1.64)

## 2022-11-19 LAB — THYROID STIMULATING HORMONE WITH FREE T4 REFLEX: TSH: 0.153 u[IU]/mL — ABNORMAL LOW (ref 0.450–5.330)

## 2022-11-19 LAB — MAGNESIUM: MAGNESIUM: 1.9 mg/dL (ref 1.9–2.7)

## 2022-11-19 MED ORDER — MEPERIDINE (PF) 25 MG/ML INJECTION SOLUTION
12.5000 mg | Freq: Once | INTRAMUSCULAR | Status: DC | PRN
Start: 2022-11-19 — End: 2022-11-20

## 2022-11-19 MED ORDER — ALBUTEROL SULFATE HFA 90 MCG/ACTUATION AEROSOL INHALER - RN
2.0000 | Freq: Once | RESPIRATORY_TRACT | Status: DC | PRN
Start: 2022-11-19 — End: 2022-11-20

## 2022-11-19 MED ORDER — HYDROCORTISONE SOD SUCCINATE 100 MG/2 ML VIAL WRAPPER
100.0000 mg | Freq: Once | INTRAMUSCULAR | Status: DC | PRN
Start: 2022-11-19 — End: 2022-11-20

## 2022-11-19 MED ORDER — ALBUTEROL SULFATE 2.5 MG/3 ML (0.083 %) SOLUTION FOR NEBULIZATION
2.5000 mg | INHALATION_SOLUTION | Freq: Once | RESPIRATORY_TRACT | Status: DC | PRN
Start: 2022-11-19 — End: 2022-11-20

## 2022-11-19 MED ORDER — DIPHENHYDRAMINE 50 MG/ML INJECTION SOLUTION
25.0000 mg | Freq: Once | INTRAMUSCULAR | Status: DC | PRN
Start: 2022-11-19 — End: 2022-11-20

## 2022-11-19 MED ORDER — ONDANSETRON HCL (PF) 4 MG/2 ML INJECTION SOLUTION
8.0000 mg | Freq: Once | INTRAMUSCULAR | Status: DC | PRN
Start: 2022-11-19 — End: 2022-11-20

## 2022-11-19 MED ORDER — DIPHENHYDRAMINE 50 MG/ML INJECTION SOLUTION
50.0000 mg | Freq: Once | INTRAMUSCULAR | Status: DC | PRN
Start: 2022-11-19 — End: 2022-11-20

## 2022-11-19 MED ORDER — DEXTROSE 5% IN WATER (D5W) FLUSH BAG - 250 ML
INTRAVENOUS | Status: DC | PRN
Start: 2022-11-19 — End: 2022-11-20

## 2022-11-19 MED ORDER — SODIUM CHLORIDE 0.9% FLUSH BAG - 250 ML
INTRAVENOUS | Status: DC | PRN
Start: 2022-11-19 — End: 2022-11-20

## 2022-11-19 MED ORDER — EPINEPHRINE 1 MG/ML (1 ML) INJECTION SOLUTION
0.3000 mg | Freq: Once | INTRAMUSCULAR | Status: DC | PRN
Start: 2022-11-19 — End: 2022-11-20

## 2022-11-19 MED ORDER — ONDANSETRON HCL 8 MG TABLET
8.0000 mg | ORAL_TABLET | Freq: Once | ORAL | Status: DC | PRN
Start: 2022-11-19 — End: 2022-11-20

## 2022-11-19 MED ORDER — FAMOTIDINE (PF) 20 MG/2 ML INTRAVENOUS SOLUTION
20.0000 mg | Freq: Once | INTRAVENOUS | Status: DC | PRN
Start: 2022-11-19 — End: 2022-11-20

## 2022-11-19 MED ORDER — SODIUM CHLORIDE 0.9 % INTRAVENOUS SOLUTION
200.0000 mg | Freq: Once | INTRAVENOUS | Status: AC
Start: 2022-11-19 — End: 2022-11-19
  Administered 2022-11-19: 0 mg via INTRAVENOUS
  Administered 2022-11-19: 200 mg via INTRAVENOUS
  Filled 2022-11-19: qty 8

## 2022-11-19 NOTE — Nurses Notes (Signed)
4782 Pt ambulatory to OP Onc unit for labs/tx. Labs drawn peripherally by hospital phlebotomist. VS obtained. Consent for Keytruda signed on 11/10/22. Immunotherapy Flow Sheet completed. Pt given verbal and written instructions [id number]. Felecia Jan, RN   Patient Assessment/Symptom Management Patient Has No MD Appointment Today   Key: (+) Symptom present           (-)  Symptom not present If Symptom is Positive(+) a Nursing Note is required   Edema -   Uncontrolled Nausea -   Vomiting -   Inability to eat/drink -   Mouth Sores -   Diarrhea -   Constipation (? Last BM) -   Fatigue that interferes with ADL's -   Numbness/Tingling -change -   Other -   Fever/Signs & Symptoms of infection -   Nurse Initials TL   0945 IV started in R wrist via 22 G catheter. Excellent blood return and flushes easily. Felecia Jan, RN  1024 CBC results received. Awaiting CMP results. Felecia Jan, RN  [phone] CMP results received. Med released to pharmacy per order. Felecia Jan, RN  [phone] Keytruda 200 mg IV infusion started. Felecia Jan, RN  1140 Keytruda infusion completed. Line flushed with NS.   TSH 0.153 pt aware and states she will contact Macario Golds NP. As she is the provider who manages her Levothyroxine.  Felecia Jan, RN  1145 VS obtained. Pt tolerated tx without difficulty. Felecia Jan, RN  1150 IV d/c'ed. Cath intact. Site clear. Drsg with Coban wrap applied. Felecia Jan, RN  1155 Pt left OP Onc unit ambulatory. No adverse reaction noted. Felecia Jan, RN

## 2022-11-20 ENCOUNTER — Encounter (INDEPENDENT_AMBULATORY_CARE_PROVIDER_SITE_OTHER): Payer: Self-pay | Admitting: NURSE PRACTITIONER

## 2022-11-27 ENCOUNTER — Other Ambulatory Visit: Payer: Self-pay

## 2022-11-27 ENCOUNTER — Encounter (INDEPENDENT_AMBULATORY_CARE_PROVIDER_SITE_OTHER): Payer: Self-pay | Admitting: NURSE PRACTITIONER

## 2022-11-27 ENCOUNTER — Ambulatory Visit (INDEPENDENT_AMBULATORY_CARE_PROVIDER_SITE_OTHER): Payer: 59 | Admitting: NURSE PRACTITIONER

## 2022-11-27 VITALS — BP 118/86 | HR 92 | Ht 69.02 in | Wt 215.0 lb

## 2022-11-27 DIAGNOSIS — K219 Gastro-esophageal reflux disease without esophagitis: Secondary | ICD-10-CM

## 2022-11-27 DIAGNOSIS — I10 Essential (primary) hypertension: Secondary | ICD-10-CM

## 2022-11-27 DIAGNOSIS — C642 Malignant neoplasm of left kidney, except renal pelvis: Secondary | ICD-10-CM

## 2022-11-27 DIAGNOSIS — E559 Vitamin D deficiency, unspecified: Secondary | ICD-10-CM

## 2022-11-27 DIAGNOSIS — E039 Hypothyroidism, unspecified: Secondary | ICD-10-CM

## 2022-11-27 MED ORDER — ERGOCALCIFEROL (VITAMIN D2) 1,250 MCG (50,000 UNIT) CAPSULE
50000.0000 [IU] | ORAL_CAPSULE | ORAL | 1 refills | Status: DC
Start: 2022-11-27 — End: 2023-05-18

## 2022-11-27 NOTE — Assessment & Plan Note (Signed)
Condition stable will continue current therapy with vitamin d replacement.

## 2022-11-27 NOTE — Assessment & Plan Note (Signed)
Condition stable will continue current therapy with valsartan and heart healthy diet.

## 2022-11-27 NOTE — Assessment & Plan Note (Signed)
Patient's last TSH was low, Patient has been experiencing some difficulty with insomnia and night sweats, synthroid dosing was adjusted, patient is to have TSH repeated at the end of this month to reassess.

## 2022-11-27 NOTE — Assessment & Plan Note (Signed)
Clinically symptoms are well controlled, continue current therapy with Prilosec.

## 2022-11-27 NOTE — Progress Notes (Signed)
[facility]NAL [facility]INE, [facility]  [address]  [place] [address]       Name: Taylor Smith MRN:  [id number]   Date: 11/27/2022 Age: 52 y.o.       Chief Complaint:    Chief Complaint   Patient presents with    Follow Up 4 Months        HPI:  Taylor Smith is a 52 y.o. female who is here today for routine f/u, chronic disease management.     She states that overall she is doing very well. She is compliant with taking all medications.     She is doing well after her surgery, she had a resection performed for her renal cell carcinoma, they were able to obtain clean margins and she had no evidence of further lymph node involvement. She follows up with [facility] again on 12-02-22, and hematology/oncology at [facility] on 12-11-22, and she has now started [place] and is doing well on this thus far.     She denies any problems or complaints today.         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 to other site, left (CMS [facility])          Past Surgical History:   Procedure Laterality Date    HX CESAREAN SECTION      HX CHOLECYSTECTOMY      HX CYST REMOVAL Right     HX HYSTERECTOMY      NEPHROURETERECTOMY Left     TOTAL THYROIDECTOMY        Current Outpatient Medications   Medication Sig    ergocalciferol, vitamin D2, (DRISDOL) 1,250 mcg (50,000 unit) Oral Capsule Take 1 Capsule (50,000 Units total) by mouth Every 7 days    hydroCHLOROthiazide (MICROZIDE) 12.5 mg Oral Capsule TAKE 1 CAPSULE (12.5 MG TOTAL) BY MOUTH ONCE A DAY INDICATIONS: HIGH BLOOD PRESSURE    Ibuprofen (MOTRIN) 600 mg Oral Tablet Take 1 Tablet (600 mg total) by mouth Every 6 hours as needed    levothyroxine (SYNTHROID) 137 mcg Oral Tablet Take 1 Tablet (137 mcg total) by mouth Every morning    omeprazole (PRILOSEC) 40  mg Oral Capsule, Delayed Release(E.C.) Take 1 Capsule (40 mg total) by mouth Once a day    pembrolizumab (KEYTRUDA) 25 mg/mL Intravenous Solution Infuse 8 mL (200 mg total) into a venous catheter Every 21 days    valsartan (DIOVAN) 160 mg Oral Tablet Take 1 Tablet (160 mg total) by mouth Once a day     Allergies   Allergen Reactions    Aldactone [Spironolactone] Itching       Family History:  Family Medical History:       Problem Relation (Age of Onset)    Cancer Maternal Aunt, Maternal Grandmother    Colon Cancer Maternal Uncle    Lung Cancer Mother    Melanoma Maternal Aunt              Social History:   Social History     Tobacco Use   Smoking Status Never   Smokeless Tobacco Never     Social History     Substance and Sexual Activity   Alcohol Use None     Social History  Occupational History    Not on file       Review of Systems:  Review of systems as discussed in HPI    Problem List:  Patient Active Problem List   Diagnosis    Vitamin D deficiency    Hypothyroid    GERD (gastroesophageal reflux disease)    Primary hypertension    Neural foraminal stenosis of cervical spine    Osteophyte of cervical spine    S/P hysterectomy    Kidney cancer, primary, with metastasis from kidney to other site, left (CMS [facility])       Physical Examination:  BP 118/86   Pulse 92   Ht 1.753 m (5' 9.02")   Wt 97.5 kg (215 lb)   SpO2 97%   BMI 31.74 kg/m       Physical Exam  Vitals and nursing note reviewed.   Constitutional:       General: She is not in acute distress.     Appearance: Normal appearance. She is well-developed, well-groomed and overweight. She is not ill-appearing.   HENT:      Head: Normocephalic and atraumatic.   Eyes:      Extraocular Movements: Extraocular movements intact.      Pupils: Pupils are equal, round, and reactive to light.   Cardiovascular:      Rate and Rhythm: Normal rate.      Pulses: Normal pulses.      Heart sounds: Normal heart sounds, S1 normal and S2 normal.   Pulmonary:      Effort:  Pulmonary effort is normal.      Breath sounds: Normal breath sounds.   Abdominal:      General: Abdomen is flat. Bowel sounds are normal.      Palpations: Abdomen is soft.   Skin:     General: Skin is warm and dry.      Capillary Refill: Capillary refill takes less than 2 seconds.   Neurological:      General: No focal deficit present.      Mental Status: She is alert and oriented to person, place, and time.        Data Reviewed:  Hospital Outpatient Visit on 11/19/2022   Component Date Value Ref Range Status    SODIUM 11/19/2022 139  136 - 145 mmol/L Final    POTASSIUM 11/19/2022 3.7  3.5 - 5.1 mmol/L Final    CHLORIDE 11/19/2022 107  98 - 107 mmol/L Final    CO2 TOTAL 11/19/2022 25  21 - 31 mmol/L Final    ANION GAP 11/19/2022 7  4 - 13 mmol/L Final    BUN 11/19/2022 14  7 - 25 mg/dL Final    CREATININE 44/04/270 0.97  0.60 - 1.30 mg/dL Final    BUN/CREA RATIO 11/19/2022 14  6 - 22 Final    ESTIMATED GFR 11/19/2022 70  >59 mL/min/1.37m^2 Final    ALBUMIN 11/19/2022 4.5  3.5 - 5.7 g/dL Final    CALCIUM 53/66/4403 9.8  8.6 - 10.3 mg/dL Final    GLUCOSE 47/42/5956 87  74 - 109 mg/dL Final    ALKALINE PHOSPHATASE 11/19/2022 59  34 - 104 U/L Final    ALT (SGPT) 11/19/2022 42  7 - 52 U/L Final    AST (SGOT) 11/19/2022 35  13 - 39 U/L Final    BILIRUBIN TOTAL 11/19/2022 0.8  0.3 - 1.0 mg/dL Final    PROTEIN TOTAL 11/19/2022 7.8  6.4 - 8.9 g/dL Final    ALBUMIN/GLOBULIN  RATIO 11/19/2022 1.4  0.8 - 1.4 Final    OSMOLALITY, CALCULATED 11/19/2022 277  270 - 290 mOsm/kg Final    CALCIUM, CORRECTED 11/19/2022 9.4  8.9 - 10.8 mg/dL Final    GLOBULIN 14/78/2956 3.3  2.9 - 5.4 Final    MAGNESIUM 11/19/2022 1.9  1.9 - 2.7 mg/dL Final    TSH 21/30/8657 0.153 (L)  0.450 - 5.330 uIU/mL Final    WBC 11/19/2022 4.5  3.8 - 11.8 x10^3/uL Final    RBC 11/19/2022 4.95 (H)  3.63 - 4.92 x10^6/uL Final    HGB 11/19/2022 15.2 (H)  10.9 - 14.3 g/dL Final    HCT 84/69/6295 44.4 (H)  31.2 - 41.9 % Final    MCV 11/19/2022 89.7  75.5 - 95.3 fL  Final    MCH 11/19/2022 30.6  24.7 - 32.8 pg Final    MCHC 11/19/2022 34.2  32.3 - 35.6 g/dL Final    RDW 28/41/3244 12.4  12.3 - 17.7 % Final    PLATELETS 11/19/2022 155  140 - 440 x10^3/uL Final    MPV 11/19/2022 10.3  7.9 - 10.8 fL Final    NEUTROPHIL % 11/19/2022 56  43 - 77 % Final    LYMPHOCYTE % 11/19/2022 29  16 - 44 % Final    MONOCYTE % 11/19/2022 10  5 - 13 % Final    EOSINOPHIL % 11/19/2022 5  1 - 7 % Final    BASOPHIL % 11/19/2022 1  0 - 1 % Final    NEUTROPHIL # 11/19/2022 2.50  1.85 - 7.80 x10^3/uL Final    LYMPHOCYTE # 11/19/2022 1.30  1.00 - 3.00 x10^3/uL Final    MONOCYTE # 11/19/2022 0.40  0.30 - 1.00 x10^3/uL Final    EOSINOPHIL # 11/19/2022 0.20  0.00 - 0.50 x10^3/uL Final    BASOPHIL # 11/19/2022 0.00  0.00 - 0.10 x10^3/uL Final    THYROXINE (T4), FREE 11/19/2022 1.32  0.58 - 1.64 ng/dL Final   Hospital Outpatient Visit on 11/13/2022   Component Date Value Ref Range Status    WBC 11/13/2022 4.7  3.8 - 11.8 x10^3/uL Final    RBC 11/13/2022 4.89  3.63 - 4.92 x10^6/uL Final    HGB 11/13/2022 15.0 (H)  10.9 - 14.3 g/dL Final    HCT 04/23/7251 44.4 (H)  31.2 - 41.9 % Final    MCV 11/13/2022 90.7  75.5 - 95.3 fL Final    MCH 11/13/2022 30.7  24.7 - 32.8 pg Final    MCHC 11/13/2022 33.9  32.3 - 35.6 g/dL Final    RDW 66/44/0347 12.4  12.3 - 17.7 % Final    PLATELETS 11/13/2022 162  140 - 440 x10^3/uL Final    MPV 11/13/2022 9.9  7.9 - 10.8 fL Final    NEUTROPHIL % 11/13/2022 56  43 - 77 % Final    LYMPHOCYTE % 11/13/2022 27  16 - 44 % Final    MONOCYTE % 11/13/2022 10  5 - 13 % Final    EOSINOPHIL % 11/13/2022 6  % Final    BASOPHIL % 11/13/2022 1  0 - 1 % Final    NEUTROPHIL # 11/13/2022 2.60  1.85 - 7.80 x10^3/uL Final    LYMPHOCYTE # 11/13/2022 1.30  1.00 - 3.00 x10^3/uL Final    MONOCYTE # 11/13/2022 0.50  0.30 - 1.00 x10^3/uL Final    EOSINOPHIL # 11/13/2022 0.30  0.00 - 0.50 x10^3/uL Final    BASOPHIL # 11/13/2022 0.00  0.00 - 0.10  x10^3/uL Final   Appointment on 10/29/2022   Component Date  Value Ref Range Status    TSH 10/29/2022 6.411 (H)  0.450 - 5.330 uIU/mL Final    VITAMIN D 10/29/2022 29 (L)  30 - 100 ng/mL Final    CHOLESTEROL 10/29/2022 171  <200 mg/dL Final    TRIGLYCERIDES 10/29/2022 114  <=150 mg/dL Final    HDL CHOL 95/62/1308 41  >=40 mg/dL Final    LDL CALC 65/78/4696 107 (H)  0 - 100 mg/dL Final    VLDL CALC 29/52/8413 23  0 - 50 mg/dL Final    CHOL/HDL RATIO 10/29/2022 4.2   Final    TSH 10/29/2022 6.411 (H)  0.450 - 5.330 uIU/mL Final   Admission on 08/25/2022, Discharged on 08/26/2022   Component Date Value Ref Range Status    SODIUM 08/25/2022 141  136 - 145 mmol/L Final    POTASSIUM 08/25/2022 3.6  3.5 - 5.1 mmol/L Final    CHLORIDE 08/25/2022 103  98 - 107 mmol/L Final    CO2 TOTAL 08/25/2022 29  21 - 32 mmol/L Final    ANION GAP 08/25/2022 9  4 - 13 mmol/L Final    BUN 08/25/2022 7  7 - 18 mg/dL Final    CREATININE 24/40/1027 0.67  0.55 - 1.02 mg/dL Final    BUN/CREA RATIO 08/25/2022 10   Final    ESTIMATED GFR 08/25/2022 104  >59 mL/min/1.73m^2 Final    ALBUMIN 08/25/2022 3.8  3.4 - 5.0 g/dL Final    Previously suppressed result.    CALCIUM 08/25/2022 8.9  8.5 - 10.1 mg/dL Final    GLUCOSE 25/36/6440 101  74 - 106 mg/dL Final    ALKALINE PHOSPHATASE 08/25/2022 86  46 - 116 U/L Final    Previously suppressed result.    ALT (SGPT) 08/25/2022 49  <=78 U/L Final    Previously suppressed result.    AST (SGOT) 08/25/2022 43 (H)  15 - 37 U/L Final    Previously suppressed result.    BILIRUBIN TOTAL 08/25/2022 0.8  0.2 - 1.0 mg/dL Final    Previously suppressed result.    PROTEIN TOTAL 08/25/2022 7.8  6.4 - 8.2 g/dL Final    Previously suppressed result.    ALBUMIN/GLOBULIN RATIO 08/25/2022 1.0  0.8 - 1.4 Final    Comment: Calculation not performed.                               Previously suppressed result.    OSMOLALITY, CALCULATED 08/25/2022 279  270 - 290 mOsm/kg Final    CALCIUM, CORRECTED 08/25/2022 9.1  mg/dL Final    Previously suppressed result.    GLOBULIN 08/25/2022 4.0    Final    Comment: Calculation not performed.    Calculation not performed.                               Previously suppressed result.    WBC 08/25/2022 5.4  4.0 - 10.5 x10^3/uL Final    RBC 08/25/2022 5.27  4.20 - 5.40 x10^6/uL Final    HGB 08/25/2022 17.2 (H)  12.5 - 16.0 g/dL Final    HCT 34/74/2595 50.9 (H)  37.0 - 47.0 % Final    MCV 08/25/2022 96.5  78.0 - 99.0 fL Final    MCH 08/25/2022 32.6 (H)  27.0 - 32.0 pg Final    MCHC 08/25/2022  33.7  32.0 - 36.0 g/dL Final    RDW 16/01/9603 15.9 (H)  11.6 - 14.8 % Final    PLATELETS 08/25/2022 169  140 - 440 x10^3/uL Final    MPV 08/25/2022 9.1  7.4 - 10.4 fL Final    NEUTROPHIL % 08/25/2022 55  40 - 76 % Final    LYMPHOCYTE % 08/25/2022 32  25 - 45 % Final    MONOCYTE % 08/25/2022 9  0 - 12 % Final    EOSINOPHIL % 08/25/2022 4  0 - 7 % Final    BASOPHIL % 08/25/2022 0  0 - 3 % Final    NEUTROPHIL # 08/25/2022 2.97  1.80 - 8.40 x10^3/uL Final    LYMPHOCYTE # 08/25/2022 1.71  1.10 - 5.00 x10^3/uL Final    MONOCYTE # 08/25/2022 0.46  0.00 - 1.30 x10^3/uL Final    EOSINOPHIL # 08/25/2022 0.24  0.00 - 0.80 x10^3/uL Final    BASOPHIL # 08/25/2022 0.00  0.00 - 0.30 x10^3/uL Final    RAINBOW/EXTRA TUBE AUTO RESULT 08/25/2022 Yes   Final    RAINBOW/EXTRA TUBE AUTO RESULT 08/25/2022 Yes   Final   Hospital Outpatient Visit on 07/11/2022   Component Date Value Ref Range Status    SODIUM 07/11/2022 138  136 - 145 mmol/L Final    POTASSIUM 07/11/2022 3.8  3.5 - 5.1 mmol/L Final    CHLORIDE 07/11/2022 102  98 - 107 mmol/L Final    CO2 TOTAL 07/11/2022 29  21 - 31 mmol/L Final    ANION GAP 07/11/2022 7  4 - 13 mmol/L Final    BUN 07/11/2022 8  7 - 25 mg/dL Final    CREATININE 54/12/8117 0.74  0.60 - 1.30 mg/dL Final    BUN/CREA RATIO 07/11/2022 11  6 - 22 Final    ESTIMATED GFR 07/11/2022 97  >59 mL/min/1.83m^2 Final    ALBUMIN 07/11/2022 4.0  3.5 - 5.7 g/dL Final    CALCIUM 14/78/2956 9.2  8.6 - 10.3 mg/dL Final    GLUCOSE 21/30/8657 169 (H)  74 - 109 mg/dL Final    ALKALINE  PHOSPHATASE 07/11/2022 58  34 - 104 U/L Final    ALT (SGPT) 07/11/2022 119 (H)  7 - 52 U/L Final    AST (SGOT) 07/11/2022 71 (H)  13 - 39 U/L Final    BILIRUBIN TOTAL 07/11/2022 1.0  0.3 - 1.2 mg/dL Final    PROTEIN TOTAL 07/11/2022 6.8  6.4 - 8.9 g/dL Final    ALBUMIN/GLOBULIN RATIO 07/11/2022 1.4  0.8 - 1.4 Final    OSMOLALITY, CALCULATED 07/11/2022 278  270 - 290 mOsm/kg Final    CALCIUM, CORRECTED 07/11/2022 9.2  8.9 - 10.8 mg/dL Final    GLOBULIN 84/69/6295 2.8 (L)  2.9 - 5.4 Final    WBC 07/11/2022 6.5  3.8 - 11.8 x10^3/uL Final    RBC 07/11/2022 5.73 (H)  3.63 - 4.92 x10^6/uL Final    HGB 07/11/2022 17.8 (H)  10.9 - 14.3 g/dL Final    HCT 28/41/3244 51.7 (H)  31.2 - 41.9 % Final    MCV 07/11/2022 90.3  75.5 - 95.3 fL Final    MCH 07/11/2022 31.0  24.7 - 32.8 pg Final    MCHC 07/11/2022 34.3  32.3 - 35.6 g/dL Final    RDW 04/23/7251 17.5  12.3 - 17.7 % Final    PLATELETS 07/11/2022 137 (L)  140 - 440 x10^3/uL Final    MPV 07/11/2022 9.0  7.9 - 10.8 fL  Final    NEUTROPHIL % 07/11/2022 75  43 - 77 % Final    LYMPHOCYTE % 07/11/2022 17  16 - 44 % Final    MONOCYTE % 07/11/2022 7  5 - 13 % Final    EOSINOPHIL % 07/11/2022 2  % Final    BASOPHIL % 07/11/2022 1  0 - 1 % Final    NEUTROPHIL # 07/11/2022 4.90  1.85 - 7.80 x10^3/uL Final    LYMPHOCYTE # 07/11/2022 1.10  1.00 - 3.00 x10^3/uL Final    MONOCYTE # 07/11/2022 0.40  0.30 - 1.00 x10^3/uL Final    EOSINOPHIL # 07/11/2022 0.10  0.00 - 0.50 x10^3/uL Final    BASOPHIL # 07/11/2022 0.00  0.00 - 0.10 x10^3/uL Final   Appointment on 06/30/2022   Component Date Value Ref Range Status    SODIUM 06/30/2022 140  136 - 145 mmol/L Final    POTASSIUM 06/30/2022 4.1  3.5 - 5.1 mmol/L Final    CHLORIDE 06/30/2022 104  98 - 107 mmol/L Final    CO2 TOTAL 06/30/2022 30  21 - 31 mmol/L Final    ANION GAP 06/30/2022 6  4 - 13 mmol/L Final    BUN 06/30/2022 12  7 - 25 mg/dL Final    CREATININE 84/13/2440 0.72  0.60 - 1.30 mg/dL Final    BUN/CREA RATIO 06/30/2022 17  6 - 22 Final     ESTIMATED GFR 06/30/2022 100  >59 mL/min/1.6m^2 Final    ALBUMIN 06/30/2022 4.1  3.5 - 5.7 g/dL Final    CALCIUM 02/15/2535 9.2  8.6 - 10.3 mg/dL Final    GLUCOSE 64/40/3474 185 (H)  74 - 109 mg/dL Final    ALKALINE PHOSPHATASE 06/30/2022 58  34 - 104 U/L Final    ALT (SGPT) 06/30/2022 72 (H)  7 - 52 U/L Final    AST (SGOT) 06/30/2022 36  13 - 39 U/L Final    BILIRUBIN TOTAL 06/30/2022 1.0  0.3 - 1.2 mg/dL Final    PROTEIN TOTAL 06/30/2022 6.8  6.4 - 8.9 g/dL Final    ALBUMIN/GLOBULIN RATIO 06/30/2022 1.5 (H)  0.8 - 1.4 Final    OSMOLALITY, CALCULATED 06/30/2022 284  270 - 290 mOsm/kg Final    CALCIUM, CORRECTED 06/30/2022 9.1  8.9 - 10.8 mg/dL Final    GLOBULIN 25/95/6387 2.7 (L)  2.9 - 5.4 Final    ERYTHROPOIETIN (EPO) 06/30/2022 5.3  2.6 - 18.5 mIU/mL Final    D-DIMER 06/30/2022 459  215 - 500 ng/mL FEU Final    WBC 06/30/2022 10.1  3.8 - 11.8 x10^3/uL Final    RBC 06/30/2022 6.09 (H)  3.63 - 4.92 x10^6/uL Final    HGB 06/30/2022 18.7 (H)  10.9 - 14.3 g/dL Final    HCT 56/43/3295 55.1 (H)  31.2 - 41.9 % Final    MCV 06/30/2022 90.5  75.5 - 95.3 fL Final    MCH 06/30/2022 30.7  24.7 - 32.8 pg Final    MCHC 06/30/2022 33.9  32.3 - 35.6 g/dL Final    RDW 18/84/1660 17.7  12.3 - 17.7 % Final    PLATELETS 06/30/2022 168  140 - 440 x10^3/uL Final    MPV 06/30/2022 8.9  7.9 - 10.8 fL Final    NEUTROPHIL % 06/30/2022 86 (H)  43 - 77 % Final    LYMPHOCYTE % 06/30/2022 10 (L)  16 - 44 % Final    MONOCYTE % 06/30/2022 4 (L)  5 - 13 % Final    EOSINOPHIL %  06/30/2022 0 (L)  % Final    BASOPHIL % 06/30/2022 0  0 - 1 % Final    NEUTROPHIL # 06/30/2022 8.70 (H)  1.85 - 7.80 x10^3/uL Final    LYMPHOCYTE # 06/30/2022 1.00  1.00 - 3.00 x10^3/uL Final    MONOCYTE # 06/30/2022 0.40  0.30 - 1.00 x10^3/uL Final    EOSINOPHIL # 06/30/2022 0.00  0.00 - 0.50 x10^3/uL Final    BASOPHIL # 06/30/2022 0.00  0.00 - 0.10 x10^3/uL Final   Admission on 06/23/2022, Discharged on 06/23/2022   Component Date Value Ref Range Status    SODIUM  06/23/2022 141  136 - 145 mmol/L Final    POTASSIUM 06/23/2022 4.3  3.5 - 5.1 mmol/L Final    CHLORIDE 06/23/2022 104  98 - 107 mmol/L Final    CO2 TOTAL 06/23/2022 25  21 - 32 mmol/L Final    ANION GAP 06/23/2022 12  4 - 13 mmol/L Final    CALCIUM 06/23/2022 9.3  8.5 - 10.1 mg/dL Final    GLUCOSE 87/56/4332 142 (H)  74 - 106 mg/dL Final    BUN 95/18/8416 15  7 - 18 mg/dL Final    CREATININE 60/63/0160 0.80  0.55 - 1.02 mg/dL Final    BUN/CREA RATIO 06/23/2022 19   Final    ESTIMATED GFR 06/23/2022 88  >59 mL/min/1.67m^2 Final    OSMOLALITY, CALCULATED 06/23/2022 285  270 - 290 mOsm/kg Final    TROPONIN I 06/23/2022 6  <15 ng/L Final    Ventricular rate 06/23/2022 79  BPM Final    Atrial Rate 06/23/2022 79  BPM Final    PR Interval 06/23/2022 140  ms Final    QRS Duration 06/23/2022 104  ms Final    QT Interval 06/23/2022 386  ms Final    QTC Calculation 06/23/2022 442  ms Final    Calculated P Axis 06/23/2022 57  degrees Final    Calculated R Axis 06/23/2022 103  degrees Final    Calculated T Axis 06/23/2022 42  degrees Final    WBC 06/23/2022 10.0  4.0 - 10.5 x10^3/uL Final    RBC 06/23/2022 6.28 (H)  4.20 - 5.40 x10^6/uL Final    HGB 06/23/2022 19.1 (H)  12.5 - 16.0 g/dL Final    HCT 10/93/2355 57.3 (H)  37.0 - 47.0 % Final    MCV 06/23/2022 91.3  78.0 - 99.0 fL Final    MCH 06/23/2022 30.4  27.0 - 32.0 pg Final    MCHC 06/23/2022 33.3  32.0 - 36.0 g/dL Final    RDW 73/22/0254 18.0 (H)  11.6 - 14.8 % Final    PLATELETS 06/23/2022 211  140 - 440 x10^3/uL Final    MPV 06/23/2022 8.8  7.4 - 10.4 fL Final    NEUTROPHIL % 06/23/2022 64  40 - 76 % Final    LYMPHOCYTE % 06/23/2022 28  25 - 45 % Final    MONOCYTE % 06/23/2022 6  0 - 12 % Final    EOSINOPHIL % 06/23/2022 2  0 - 7 % Final    BASOPHIL % 06/23/2022 0  0 - 3 % Final    NEUTROPHIL # 06/23/2022 6.43  1.80 - 8.40 x10^3/uL Final    LYMPHOCYTE # 06/23/2022 2.77  1.10 - 5.00 x10^3/uL Final    MONOCYTE # 06/23/2022 0.61  0.00 - 1.30 x10^3/uL Final    EOSINOPHIL #  06/23/2022 0.16  0.00 - 0.80 x10^3/uL Final    BASOPHIL # 06/23/2022 0.01  0.00 - 0.30 x10^3/uL Final    TROPONIN I 06/23/2022 8  <15 ng/L Final   Hospital Outpatient Visit on 06/13/2022   Component Date Value Ref Range Status    SODIUM 06/13/2022 140  136 - 145 mmol/L Final    POTASSIUM 06/13/2022 3.8  3.5 - 5.1 mmol/L Final    CHLORIDE 06/13/2022 103  98 - 107 mmol/L Final    CO2 TOTAL 06/13/2022 31  21 - 31 mmol/L Final    ANION GAP 06/13/2022 6  4 - 13 mmol/L Final    BUN 06/13/2022 12  7 - 25 mg/dL Final    CREATININE 66/44/0347 0.64  0.60 - 1.30 mg/dL Final    BUN/CREA RATIO 06/13/2022 19  6 - 22 Final    ESTIMATED GFR 06/13/2022 106  >59 mL/min/1.60m^2 Final    ALBUMIN 06/13/2022 4.0  3.5 - 5.7 g/dL Final    CALCIUM 42/59/5638 9.4  8.6 - 10.3 mg/dL Final    GLUCOSE 75/64/3329 70 (L)  74 - 109 mg/dL Final    ALKALINE PHOSPHATASE 06/13/2022 67  34 - 104 U/L Final    ALT (SGPT) 06/13/2022 117 (H)  7 - 52 U/L Final    AST (SGOT) 06/13/2022 58 (H)  13 - 39 U/L Final    BILIRUBIN TOTAL 06/13/2022 0.8  0.3 - 1.2 mg/dL Final    PROTEIN TOTAL 06/13/2022 7.3  6.4 - 8.9 g/dL Final    ALBUMIN/GLOBULIN RATIO 06/13/2022 1.2  0.8 - 1.4 Final    OSMOLALITY, CALCULATED 06/13/2022 278  270 - 290 mOsm/kg Final    CALCIUM, CORRECTED 06/13/2022 9.4  8.9 - 10.8 mg/dL Final    GLOBULIN 51/88/4166 3.3  2.9 - 5.4 Final    WBC 06/13/2022 11.3  3.8 - 11.8 x10^3/uL Final    RBC 06/13/2022 6.06 (H)  3.63 - 4.92 x10^6/uL Final    HGB 06/13/2022 18.1 (H)  10.9 - 14.3 g/dL Final    HCT 10/19/1599 54.1 (H)  31.2 - 41.9 % Final    MCV 06/13/2022 89.1  75.5 - 95.3 fL Final    MCH 06/13/2022 29.8  24.7 - 32.8 pg Final    MCHC 06/13/2022 33.4  32.3 - 35.6 g/dL Final    RDW 09/32/3557 17.7  12.3 - 17.7 % Final    PLATELETS 06/13/2022 173  140 - 440 x10^3/uL Final    MPV 06/13/2022 8.9  7.9 - 10.8 fL Final    NEUTROPHIL % 06/13/2022 63  43 - 77 % Final    LYMPHOCYTE % 06/13/2022 27  16 - 44 % Final    MONOCYTE % 06/13/2022 8  5 - 13 % Final     EOSINOPHIL % 06/13/2022 1  % Final    BASOPHIL % 06/13/2022 1  0 - 1 % Final    NEUTROPHIL # 06/13/2022 7.10  1.85 - 7.80 x10^3/uL Final    LYMPHOCYTE # 06/13/2022 3.10 (H)  1.00 - 3.00 x10^3/uL Final    MONOCYTE # 06/13/2022 0.90  0.30 - 1.00 x10^3/uL Final    EOSINOPHIL # 06/13/2022 0.10  0.00 - 0.50 x10^3/uL Final    BASOPHIL # 06/13/2022 0.10  0.00 - 0.10 x10^3/uL Final        Health Maintenance:  Health Maintenance   Topic Date Due    Hepatitis C screening  Never done    Pneumococcal Vaccine, Age 12-64 (1 of 2 - PCV) Never done    HIV Screening  Never done    Adult Tdap-Td (1 -  Tdap) Never done    Hepatitis B Vaccine (1 of 3 - 19+ 3-dose series) Never done    Shingles Vaccine (1 of 2) Never done    Pap smear/HPV  Never done    Breast Cancer Screening  Never done    Colonoscopy  Never done    Covid-19 Vaccine (4 - 2023-24 season) 12/20/2021    Influenza Vaccine (1) 12/21/2022    NonMedicare Preventative Exam  06/26/2023    Depression Screening  11/27/2023    Meningococcal Vaccine  Aged Out        Assessment & Plan   Problem List Items Addressed This Visit          Cardiovascular System    Primary hypertension - Primary (Chronic)     Condition stable will continue current therapy with valsartan and heart healthy diet.               Nephrology    Kidney cancer, primary, with metastasis from kidney to other site, left (CMS [facility])       Digestive    GERD (gastroesophageal reflux disease) (Chronic)     Clinically symptoms are well controlled, continue current therapy with Prilosec.             Endocrine    Vitamin D deficiency (Chronic)     Condition stable will continue current therapy with vitamin d replacement.            Hypothyroid (Chronic)     Patient's last TSH was low, Patient has been experiencing some difficulty with insomnia and night sweats, synthroid dosing was adjusted, patient is to have TSH repeated at the end of this month to reassess.              Depression screening is negative. PHQ 2 Total: 0        Chronic disease management follow up. Patient is taking and tolerating all medications well without complaint of negative side effects.   Past medical history/medications/labs/ and current plan of care summarized and discussed in detail with patient during visit.   Return to clinic with new or worsening symptoms.         Follow up:  Return in about 4 months (around 03/29/2023), or if symptoms worsen or fail to improve.    This note was partially created using voice recognition software and is inherently subject to errors including those of syntax and "sound alike " substitutions which may escape proof reading.  In such instances, original meaning may be extrapolated by contextual derivation.    Macario Golds, FNP-C  11/27/2022, 09:55

## 2022-12-02 NOTE — Progress Notes (Signed)
GENITOURINARY ONCOLOGY CLINIC - RETURN VISIT    We are happy to address any questions you may have regarding what is written in your note at your next clinic visit, or you can call [phone] and schedule a clinic visit to discuss your note. Unfortunately, we will not be able to discuss these notes via telephone or MyChart messages.    PATIENT IDENTIFICATION   Taylor Smith is a 52 y.o. 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    Comes to clinic with her husband.    ASSESSMENT & PLAN     1. Metastatic renal cell carcinoma, left (CMS/HHS-HCC)    2. Malignant neoplasm metastatic to left lung (CMS/HHS-HCC)    3. Drug-induced hepatitis    4. Renal vein thrombosis (CMS/HHS-HCC)      Taylor Smith is a 52 yo never smoker with a large L ccRCC with RV invasion and thrombosis and small volume lung metastases- IMDC intermediate risk. She responded to ipi/nivo x4 but had a complication of severe liver injury. She was then trialed on axi with some continued response and then was able to have a cytoreductive nephrectomy in 08/2022. 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 She has been started on pembrolizumab per the Keynote 564 trial. However, this trial did not evaluate patients who had already received ipi/nivo and certainly not a VEGF TKI. Further, she had a severe liver injury with ipi/nivo. She has a very mild AST and ALT elevation after her first dose of pembrolizumab. Given both the lack of evidence in patient's such as Taylor Smith and the history of severe liver injury, we discussed the option of surveillance instead of pembrolizumab. She would prefer to proceed with surveillance.     Plan   -RTC 4-6 months with CT scan  -Hold pembrolizumab going forward    All questions were addressed today. I also recommended germline testing for cancer syndromes especially BRCA2, BRCA1, ATM, VHL and others given her strong +FH of ovarian  cancer, pancreatic and breast cancer. This was normal. Likely environmentally caused.     ONCOLOGY HISTORY     ccRCC history  -referral (self), sees Taylor Dawn MD in [place]  --Taylor Smith is a 52 y.o. 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 --found on TAH to have met ccRCC.  CT cap found a 20 cm massive left kidney mass with numerous small pulmonary nodules. The renal mass appears to be involving the renal vein but not the inferior vena cava, with RVT.  --01/06/22 TAH/BSO. Path: A. Vaginal polyp, biopsy: Clear cell carcinoma, present at biopsy margin.   Tumor cells are strongly and diffusely positive for pancytokeratin, CD10 and PAX8 and are negative for CK7 and CEA  B.  Uterus, cervix and left fallopian tube and ovary, total hysterectomy and left salpingo-oophorectomy: Cervix showing nabothian cysts and mostly denuded epithelium. Uterus showing atrophic endometrium and serosal adhesions.  Left ovary showing numerous corpora albicantia.  Left fallopian tube showing changes consistent with tubal ligation.  No malignancy identified.    Prior to this, she was not having any symptoms other than L>R LBP, never really worked up. No fevers, hematuria or history of iron-deficiency anemia.     02/07/2022: ipi/nivo initial systemic therapy.  Tolerated the 1st treatment well. She did have some achiness of the legs which was pretty brief  in nature.  02/28/2022: cycle 3.  03/21/2022: cycle 4 of combination ipilimumab and nivolumab.   04/11/2022: The patient is here for follow up of metastatic renal cell cancer. She has been on treatment with combination immunotherapy. At last contact, her transaminases were mildly elevated. On this visit, her bilirubin and transaminases are markedly higher AST/ALT>1000, TB 8.9, improved in 1 week to AST 430 ALT 260 TB 3.3  04/09/22 CT chest: Lungs/Pleura: The lungs are clear without focal  consolidation. Mild subsegmental atelectasis. Linear atelectasis/scarring is noted in the left lung base.  There is overall decreased size and number of multifocal metastatic lesions throughout the lungs, for instance subpleural anterior left and right upper lobe nodules are no longer visualized. Left lower lobe nodule previously measuring 12 mm now measures 6 mm (series 6 image 183). The largest residual lesions measure 23 mm in the left lower lobe and 5 mm in the basilar right lower lobe.   04/16/22 CT a/p:  FINDINGS:   Lung bases: Mild bibasilar atelectasis/scarring. Known metastatic lung lesions are again seen with the largest in the left lower lobe measuring 2.3 cm.   Liver:   Unremarkable.   Kidneys:   Redemonstration of the known large centrally necrotic left renal malignancy involving the mid and upper pole measuring approximately 19 x 10 x 14 cm. Probable thrombus is again seen within portions of the left renal vein near the hilum but does not appear to extend to the level of the IVC.   The right kidney is unremarkable.  Lymph nodes:  No significant interval change in the upper abdominal lymph nodes in the common hepatic and portacaval regions as well as numerous retroperitoneal lymph nodes.   Vasculature:   Redemonstration of numerous large varices in the left upper abdomen surrounding the known renal malignancy. 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     04/18/2022: ER for abdominal pain, GERD/resolved with prilosec.  Given 100 mg prednisone x 2 weeks, completed early Jan 2024 with resolution of symptoms, jaundice.  05/2022 started axitinib daily, immunotherapy on hold due to hepatitis, treated with steroids  09/10/22 Kidney, left, radical nephrectomy (Taylor Smith). Path: Renal cell carcinoma (10.0 cm), clear-cell type, ISUP grade 3. Surgical margins of resection are negative for carcinoma.  Tumor invades renal vein, renal pelvis, and perirenal fat (pT3a).  Benign  adrenal gland, negative for carcinoma.   B. Paraaortic lymph nodes, excision: Five lymph nodes, negative for malignancy (0/5).  --she is considering post-op adjuvant pembro in [place]. 11/19/22 LFTs normal, Cr 0.97    INTERVAL HISTORY  Kayra returns 12/02/2022 to discuss her RCC. She did well with her surgery with Dr Assunta Found. Since then CT scan showed NED today. Her blood thinner was also stopped after the surgery following Heme consult. She talked to Dr Assunta Found who recommended [facility] x1 yr. Dr Damita Lack locally started it 11/19/22. Her AST and ALT rose to 47 and 55 today from 35 and 52 before treatment. She otherwise feels very well.  No SOB, cough, LE edema.  No n/v.    MOLECULAR DIAGNOSTICS AND MARKERS   Germline testing done at [facility] and no heritable mutations per patient      HISTORY AND MEDICATIONS     Past Medical History:   Diagnosis Date   . Cancer with pulmonary metastases (CMS/HHS-HCC)    . Drug-induced hepatitis    . HTN (hypertension)    . Metastatic renal cell carcinoma, left (CMS/HHS-HCC)    . Renal vein  thrombosis (CMS/HHS-HCC)      Past Surgical History:   Procedure Laterality Date   . NEPHRECTOMY W/PARTIAL URETERECTOMY W/RIB RESECTION OPEN Left 10/09/2022    Procedure: NEPHRECTOMY, INCLUDING PARTIAL URETERECTOMY, ANY OPEN APPROACH INCLUDING RIB RESECTION;  Surgeon: Marylene Buerger, MD;  Location: [facility] OR;  Service: Urology;  Laterality: Left;  renal vein thrombectomy,   . LYMPHADENECTOMY RETROPERITONEUM Left 10/09/2022    Procedure: LIMITED LYMPHADENECTOMY FOR STAGING (SEPARATE PROCEDURE); RETROPERITONEAL (AORTIC AND/OR SPLENIC);  Surgeon: Marylene Buerger, MD;  Location: [facility];  Service: Urology;  Laterality: Left;   . CESAREAN SECTION     . CHOLECYSTECTOMY     . cyst removal from right ovary     . HYSTERECTOMY SUPRACERVICAL ABDOMINAL W/REMOVAL TUBES &/OR OVARIES     . THYROIDECTOMY TOTAL       Family History   Problem Relation Age of Onset   . Breast cancer Mother    . Pancreatic cancer  Maternal Grandmother    . Lung cancer Maternal Grandfather    . Ovarian cancer Maternal Aunt    . Melanoma Maternal Aunt    . Lung cancer Paternal Uncle    . Anesthesia problems Neg Hx      Social History     Socioeconomic History   . Marital status: Married   Tobacco Use   . Smoking status: Never   . Smokeless tobacco: Never   Vaping Use   . Vaping status: Never Used   Substance and Sexual Activity   . Alcohol use: Not Currently   . Drug use: Never   . 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 3 children. Extensive +FH of malignancy. No second hand smoke     Social Determinants of Health     Transportation Needs: No Transportation Needs (10/10/2022)    PRAPARE - Transportation    . Lack of Transportation (Medical): No    . Lack of Transportation (Non-Medical): No       Military veteran:  NO    Allergies   Allergen Reactions   . Spironolactone Itching       Outpatient Encounter Medications as of 12/02/2022   Medication Sig Dispense Refill   . axitinib (INLYTA) 5 mg tablet Take by mouth 2 (two) times daily (Patient not taking: Reported on 10/30/2022)     . ergocalciferol, vitamin D2, 1,250 mcg (50,000 unit) capsule Take 1 capsule by mouth once a week Fridays (Patient not taking: Reported on 10/30/2022)     . hydroCHLOROthiazide (MICROZIDE) 12.5 mg capsule Take 12.5 mg by mouth once daily (Patient not taking: Reported on 10/30/2022)     . levothyroxine (SYNTHROID) 125 MCG tablet Take 125 mcg by mouth once daily (Patient not taking: Reported on 10/30/2022)     . levothyroxine (SYNTHROID) 137 MCG tablet Take 137 mcg by mouth once daily     . omeprazole (PRILOSEC) 40 MG DR capsule Take 1 capsule by mouth once daily (Patient not taking: Reported on 10/30/2022)     . valsartan (DIOVAN) 160 MG tablet Take by mouth once daily       No facility-administered encounter medications on file as of 12/02/2022.       PHYSICAL EXAMINATION     There were no vitals taken for this  visit.    Wt Readings from Last 3 Encounters:   10/30/22 99.1 kg (218 lb 7.6 oz)   10/09/22 (!) 102.3 kg (225 lb  9.6 oz)   09/19/22 (!) 101.2 kg (223 lb 1.7 oz)       ECOG PS:0 Fully active; no performance restrictions  Karnofsky PS: 100    GENERAL: Well appearing female; no acute distress  HEENT: Normocephalic and atraumatic; sclerae anicteric; oropharynx is clear and without evidence of thrush or mucositis  CHEST: Clear to auscultation bilaterally without wheezes or rhonchi.   CARDIOVASCULAR: Regular rate and rhythm; no murmurs appreciated  ABDOMEN: Soft, non-tender, non-distended, normal BS; no masses or hepatosplenomegaly   EXTREMITIES: No clubbing, cyanosis or edema  NEUROLOGIC: Grossly nonfocal.      LABS/STUDIES     Impression      NO EVIDENCE OF ACUTE THROMBOEMBOLIC DISEASE OR OTHER ACUTE INTRATHORACIC FINDINGS.    FURTHER IMPROVEMENT IN THE METASTATIC LUNG DISEASE SINCE 04/09/2022    REDEMONSTRATION OF THE KNOWN LEFT RENAL MALIGNANCY WITH CHRONIC LEFT RENAL VEIN THROMBUS.      One or more dose reduction techniques were used (e.g., Automated exposure control, adjustment of the mA and/or kV according to patient size, use of iterative reconstruction technique).      Radiologist location ID: [id number]  Narrative    Matsuko A Delcastillo    RADIOLOGIST: Mallie Mussel    CT ANGIO CHEST FOR PULMONARY EMBOLUS W IV CONTRAST performed on 06/23/2022 8:50 AM    CLINICAL HISTORY: hx metastatic kidney cancer with lung mets; worsening substernal CP.  Chest pain    TECHNIQUE: CTA imaging of the chest with intravenous contrast.  3D reconstructions.  CONTRAST:  100 ml's of Omnipaque 350    COMPARISON: 04/09/2022  # of known CTs in the past 12 months:  5  # of known Cardiac Nuclear Medicine Studies in the past 12 months:  0        FINDINGS:  Hardware:  None.    Lymph nodes:   No mediastinal, hilar, or axillary lymphadenopathy.    Heart:  Normal heart size.  No pericardial effusion.       RV/LV Diameter Ratio: N/A    Thoracic  Aorta:  No thoracic aortic aneurysm or dissection.    Pulmonary Vessels:  No evidence of acute pulmonary emboli through the major subsegmental branches.     Most Proximal Level Of Embolus (if embolus present): N/A    Lungs and Airways:  Further interval decrease in size of the metastatic lung lesions. An index left lower lobe nodule now measures 8 mm, (previous 23 mm, image 159, series 3). Minimal bibasilar atelectasis. No focal airspace consolidation. The central airways are clear.    Pleura: No pleural effusion.  No pneumothorax.    Upper Abdomen: Visualized portions of the upper abdominal viscera redemonstrate the known large left renal malignancy with left renal vein thrombosis.    Bones: Bone windows are unchanged including the small lucent lesion in the right T3 vertebral body.    MRI abdomen with and without contrast    Result Date: 08/05/2022  Procedure:  MRI Abdomen with and without contrast Indication: Kidney cancer, staging, Renal cancer staging, C64.2 Malignant neoplasm of left kidney, except renal pelvis (CMS/HHS-HCC) Comparison:  CT abdomen and pelvis 04/16/2022. Technique: Precontrast and dynamic postcontrast MR imaging of the abdomen was performed using the renal Protocol. IV contrast was administered to improve disease detection and further define anatomy. Findings: - Lower Thorax: Similar right upper lobe 8 mm pulmonary nodule on series 3 image 22. Previously seen left lower lobe pulmonary nodules is not conspicuous on today's study. Correlate with dedicated CT imaging of  the chest. No pleural or pericardial effusions. - Liver: Normal in morphology and enhancement. Hepatic steatosis.  No suspicious hepatic masses are identified.  The portal and hepatic veins are patent. - Biliary and Gallbladder: No intrahepatic or extrahepatic bile duct dilatation. The gallbladder is unremarkable. - Spleen: Normal in appearance.  - Pancreas: Normal in appearance. No suspicious pancreatic lesions. - Adrenal Glands:  Normal in appearance. Left adrenal gland is seen on series 6 image 44 and appears to be within normal limits. - Kidneys: Redemonstrated large heterogeneous centrally necrotic superior pole left renal mass measuring 10.2 x 8.0 x 12.1 cm. This previously measured 9.0 x 11.7 x 15.0 cm, slightly decreased in size. Intrinsic areas of T1 hyperintensity likely reflecting hemorrhage. This abuts the medial aspect of the spleen as well as pancreatic tail. The left renal vein is nonvisualized, likely occluded. Prominent perirenal varices with similar nonocclusive thrombus reidentified on series 6 image 41. Enlarged left gonadal vein. No hydronephrosis. Normal enhancement of the right kidney. - Abdominal and Pelvic Vasculature: No abdominal aortic aneurysm. - Gastrointestinal Tract: No abnormal dilation or wall thickening. - Peritoneum/Mesentery/Retroperitoneum: No free fluid. - Lymph Nodes: Mildly prominent left periaortic lymph node measuring 7 mm on series 8 image 27, previously 12 mm. An aortocaval lymph node measures 8 mm on series 6 image 30, previously 15 mm. - Body Wall: Unremarkable. - Musculoskeletal:  No aggressive appearing osseous lesions. Impression: 1. Decreased size of left renal mass and retroperitoneal lymphadenopathy. 2. No new metastatic disease within the abdomen. Electronically Reviewed by:  Hedwig Morton, MD, [facility] Electronically Reviewed on:  08/05/2022 10:16 AM I have reviewed the images and concur with the above findings. Electronically Signed by:  Alden Hipp, MD, [facility] Electronically Signed on:  08/05/2022 10:19 AM     Recent Results (from the past 168 hour(s))   Research Blood: 3 EDTA and 2 heparin tubes    Collection Time: 12/02/22 12:05 PM   Result Value Ref Range    Auto Result Yes    Thyroxine (T4), Free    Collection Time: 12/02/22 12:05 PM   Result Value Ref Range    Thyroxine, Free (FT4) 0.81 0.52 - 1.21 ng/dL   Thyroid Stimulating Hormone (TSH)    Collection Time: 12/02/22  12:05 PM   Result Value Ref Range    Thyroid Stimulating Hormone (TSH) 0.27 (L) 0.34 - 5.66 IU/mL   Complete Blood Count (CBC) with Differential    Collection Time: 12/02/22 12:05 PM   Result Value Ref Range    WBC (White Blood Cell Count) 4.6 3.2 - 9.8 x10^9/L    Hemoglobin 15.2 11.7 - 15.5 g/dL    Hematocrit 10.2 (H) 35.0 - 45.0 %    Platelets 180 150 - 450 x10^9/L    MCV (Mean Corpuscular Volume) 91 80 - 98 fL    MCH (Mean Corpuscular Hemoglobin) 30.2 26.5 - 34.0 pg    MCHC (Mean Corpuscular Hemoglobin Concentration) 33.1 31.0 - 36.0 %    RBC (Red Blood Cell Count) 5.04 3.77 - 5.16 x10^12/L    RDW-CV (Red Cell Distribution Width) 11.9 11.5 - 14.5 %    NRBC (Nucleated Red Blood Cell Count) 0.00 0 x10^9/L    NRBC % (Nucleated Red Blood Cell %) 0.0 %    MPV (Mean Platelet Volume) 11.7 7.2 - 11.7 fL    Neutrophil Count 2.5 2.0 - 8.6 x10^9/L    Neutrophil % 54.3 37 - 80 %    Lymphocyte Count 1.3 0.6 - 4.2  x10^9/L    Lymphocyte % 29.3 10 - 50 %    Monocyte Count 0.5 0 - 0.9 x10^9/L    Monocyte % 10.5 0 - 12 %    Eosinophil Count 0.22 0 - 0.70 x10^9/L    Eosinophil % 4.8 0 - 7 %    Basophil Count 0.04 0 - 0.20 x10^9/L    Basophil % 0.9 0 - 2 %    Immature Granulocyte Count 0.01 <=0.06 x10^9/L    Immature Granulocyte % 0.2 <=0.7 %   Lactate Dehydrogenase (LDH)    Collection Time: 12/02/22 12:05 PM   Result Value Ref Range    Lactate Dehydrogenase (LDH) 131 100 - 200 U/L   Comprehensive Metabolic Panel (CMP)    Collection Time: 12/02/22 12:05 PM   Result Value Ref Range    Sodium 138 135 - 145 mmol/L    Potassium 4.2 3.5 - 5.0 mmol/L    Chloride 105 98 - 108 mmol/L    Carbon Dioxide (CO2) 27 21 - 30 mmol/L    Urea Nitrogen (BUN) 11 7 - 20 mg/dL    Creatinine 0.9 0.4 - 1.0 mg/dL    Glucose 161 70 - 096 mg/dL    Calcium 9.7 8.7 - 04.5 mg/dL    AST (Aspartate Aminotransferase) 47 (H) 15 - 41 U/L    ALT (Alanine Aminotransferase) 55 (H) 10 - 39 U/L    Bilirubin, Total 0.8 0.4 - 1.5 mg/dL    Alk Phos (Alkaline Phosphatase) 64  24 - 110 U/L    Albumin 4.5 3.5 - 4.8 g/dL    Protein, Total 7.8 6.2 - 8.1 g/dL    Anion Gap 6 3 - 12 mmol/L    BUN/CREA Ratio 12 6 - 27    Glomerular Filtration Rate (eGFR)  76 mL/min/1.73sq m   ]      All studies completed in the past week were reviewed personally in the clinic.    CARE TEAM   Patient Care Team:  Macario Golds as PCP - General  Damita Lack, Ellsworth Lennox, MD (Medical Oncology)    FUTURE APPOINTMENTS     Future Appointments       Date/Time Provider Department Center Visit Type    12/02/2022 2:30 PM (Arrive by 2:15 PM) Donell Sievert, MD [facility] RETURN VISIT              The plan was discussed in detail with the patient today, who expressed understanding.  The patient has my contact information, and understands to call me with any additional questions or concerns.    Thank you for the opportunity to participate in the care of Taylor. Wargo.  Please do not hesitate to contact me with questions regarding this visit.    Gates Rigg, MD  Hematology-Oncology Fellow, First Year    Seen with Dr Jolyne Loa Statement:     I personally saw and evaluated the patient, and participated in the management and treatment plan as documented in the resident/fellow note.    Kathryne Gin, MD    Kathryne Gin, MD [employer] FACP  Professor of Medicine, Surgery, and Pharmacology and Cancer Biology  [facility] [phone]  [facility] [place], [place] [address]  Fax [phone]  Phone [phone]

## 2022-12-03 ENCOUNTER — Encounter (INDEPENDENT_AMBULATORY_CARE_PROVIDER_SITE_OTHER): Payer: Self-pay | Admitting: HEMATOLOGY-ONCOLOGY

## 2022-12-04 ENCOUNTER — Encounter (INDEPENDENT_AMBULATORY_CARE_PROVIDER_SITE_OTHER): Payer: Self-pay | Admitting: HEMATOLOGY-ONCOLOGY

## 2022-12-04 NOTE — Nursing Note (Signed)
I spoke with patient on phone, she states she saw Dr. Hilda Blades at [facility] and he does not feel like patient needs to continue [facility] because her liver enzymes are elevated but it was going to be up to her to make that decision.  Per patient, he recommend she do a 6 month follow-up for scans.  Secure chat sent to [facility] to obtain last visit note from Dr. Laqueta Jean office.  Patient states she is doing well and has no complaints at this time.

## 2022-12-09 ENCOUNTER — Encounter (INDEPENDENT_AMBULATORY_CARE_PROVIDER_SITE_OTHER): Payer: Self-pay | Admitting: HEMATOLOGY-ONCOLOGY

## 2022-12-10 ENCOUNTER — Ambulatory Visit (HOSPITAL_COMMUNITY): Payer: Self-pay

## 2022-12-10 ENCOUNTER — Other Ambulatory Visit: Payer: Self-pay

## 2022-12-10 ENCOUNTER — Ambulatory Visit (HOSPITAL_BASED_OUTPATIENT_CLINIC_OR_DEPARTMENT_OTHER): Payer: 59 | Admitting: HEMATOLOGY-ONCOLOGY

## 2022-12-10 ENCOUNTER — Encounter (INDEPENDENT_AMBULATORY_CARE_PROVIDER_SITE_OTHER): Payer: Self-pay | Admitting: HEMATOLOGY-ONCOLOGY

## 2022-12-10 ENCOUNTER — Ambulatory Visit
Admission: RE | Admit: 2022-12-10 | Discharge: 2022-12-10 | Disposition: A | Discharge status: Home / Self Care | Payer: 59 | Source: Ambulatory Visit | Attending: HEMATOLOGY-ONCOLOGY | Admitting: HEMATOLOGY-ONCOLOGY

## 2022-12-10 ENCOUNTER — Ambulatory Visit (INDEPENDENT_AMBULATORY_CARE_PROVIDER_SITE_OTHER)
Admission: RE | Admit: 2022-12-10 | Discharge: 2022-12-10 | Disposition: A | Discharge status: Home / Self Care | Payer: 59 | Source: Ambulatory Visit | Attending: HEMATOLOGY-ONCOLOGY | Admitting: HEMATOLOGY-ONCOLOGY

## 2022-12-10 VITALS — BP 141/93 | HR 79 | Temp 98.2°F | Ht 69.0 in | Wt 214.4 lb

## 2022-12-10 VITALS — BP 145/86 | HR 56 | Temp 97.7°F | Resp 20

## 2022-12-10 DIAGNOSIS — C642 Malignant neoplasm of left kidney, except renal pelvis: Secondary | ICD-10-CM

## 2022-12-10 DIAGNOSIS — C7800 Secondary malignant neoplasm of unspecified lung: Secondary | ICD-10-CM | POA: Insufficient documentation

## 2022-12-10 DIAGNOSIS — G893 Neoplasm related pain (acute) (chronic): Secondary | ICD-10-CM | POA: Insufficient documentation

## 2022-12-10 DIAGNOSIS — Z7901 Long term (current) use of anticoagulants: Secondary | ICD-10-CM | POA: Insufficient documentation

## 2022-12-10 DIAGNOSIS — Z5112 Encounter for antineoplastic immunotherapy: Secondary | ICD-10-CM | POA: Insufficient documentation

## 2022-12-10 DIAGNOSIS — Z86718 Personal history of other venous thrombosis and embolism: Secondary | ICD-10-CM | POA: Insufficient documentation

## 2022-12-10 LAB — COMPREHENSIVE METABOLIC PANEL, NON-FASTING
ALBUMIN/GLOBULIN RATIO: 1.5 — ABNORMAL HIGH (ref 0.8–1.4)
ALBUMIN: 4.4 g/dL (ref 3.5–5.7)
ALKALINE PHOSPHATASE: 62 U/L (ref 34–104)
ALT (SGPT): 39 U/L (ref 7–52)
ANION GAP: 9 mmol/L (ref 4–13)
AST (SGOT): 33 U/L (ref 13–39)
BILIRUBIN TOTAL: 0.8 mg/dL (ref 0.3–1.0)
BUN/CREA RATIO: 8 (ref 6–22)
BUN: 9 mg/dL (ref 7–25)
CALCIUM, CORRECTED: 9.5 mg/dL (ref 8.9–10.8)
CALCIUM: 9.8 mg/dL (ref 8.6–10.3)
CHLORIDE: 106 mmol/L (ref 98–107)
CO2 TOTAL: 26 mmol/L (ref 21–31)
CREATININE: 1.1 mg/dL (ref 0.60–1.30)
ESTIMATED GFR: 60 mL/min/{1.73_m2} (ref >59)
GLOBULIN: 3 (ref 2.9–5.4)
GLUCOSE: 104 mg/dL (ref 74–109)
OSMOLALITY, CALCULATED: 280 mOsm/kg (ref 270–290)
POTASSIUM: 3.7 mmol/L (ref 3.5–5.1)
PROTEIN TOTAL: 7.4 g/dL (ref 6.4–8.9)
SODIUM: 141 mmol/L (ref 136–145)

## 2022-12-10 LAB — CBC WITH DIFF
BASOPHIL #: 0 10*3/uL (ref 0.00–0.10)
BASOPHIL %: 1 % (ref 0–1)
EOSINOPHIL #: 0.2 10*3/uL (ref 0.00–0.50)
EOSINOPHIL %: 5 % (ref 1–7)
HCT: 43.5 % — ABNORMAL HIGH (ref 31.2–41.9)
HGB: 14.9 g/dL — ABNORMAL HIGH (ref 10.9–14.3)
LYMPHOCYTE #: 1 10*3/uL (ref 1.00–3.00)
LYMPHOCYTE %: 28 % (ref 16–44)
MCH: 30.5 pg (ref 24.7–32.8)
MCHC: 34.3 g/dL (ref 32.3–35.6)
MCV: 88.9 fL (ref 75.5–95.3)
MONOCYTE #: 0.3 10*3/uL (ref 0.30–1.00)
MONOCYTE %: 9 % (ref 5–13)
MPV: 10.2 fL (ref 7.9–10.8)
NEUTROPHIL #: 2.1 10*3/uL (ref 1.85–7.80)
NEUTROPHIL %: 57 % (ref 43–77)
PLATELETS: 152 10*3/uL (ref 140–440)
RBC: 4.89 10*6/uL (ref 3.63–4.92)
RDW: 12.8 % (ref 12.3–17.7)
WBC: 3.6 10*3/uL — ABNORMAL LOW (ref 3.8–11.8)

## 2022-12-10 LAB — MAGNESIUM: MAGNESIUM: 1.9 mg/dL (ref 1.9–2.7)

## 2022-12-10 LAB — THYROID STIMULATING HORMONE WITH FREE T4 REFLEX: TSH: 1.107 u[IU]/mL (ref 0.450–5.330)

## 2022-12-10 MED ORDER — SODIUM CHLORIDE 0.9 % INTRAVENOUS SOLUTION
200.0000 mg | Freq: Once | INTRAVENOUS | Status: AC
Start: 2022-12-10 — End: 2022-12-10
  Administered 2022-12-10: 0 mg via INTRAVENOUS
  Administered 2022-12-10: 200 mg via INTRAVENOUS
  Filled 2022-12-10: qty 8

## 2022-12-10 MED ORDER — ALBUTEROL SULFATE 2.5 MG/3 ML (0.083 %) SOLUTION FOR NEBULIZATION
2.5000 mg | INHALATION_SOLUTION | Freq: Once | RESPIRATORY_TRACT | Status: DC | PRN
Start: 2022-12-10 — End: 2022-12-11

## 2022-12-10 MED ORDER — ONDANSETRON HCL 8 MG TABLET
8.0000 mg | ORAL_TABLET | Freq: Once | ORAL | Status: DC | PRN
Start: 2022-12-10 — End: 2022-12-11

## 2022-12-10 MED ORDER — ALBUTEROL SULFATE HFA 90 MCG/ACTUATION AEROSOL INHALER - RN
2.0000 | Freq: Once | RESPIRATORY_TRACT | Status: DC | PRN
Start: 2022-12-10 — End: 2022-12-11

## 2022-12-10 MED ORDER — DIPHENHYDRAMINE 50 MG/ML INJECTION SOLUTION
25.0000 mg | Freq: Once | INTRAMUSCULAR | Status: DC | PRN
Start: 2022-12-10 — End: 2022-12-11

## 2022-12-10 MED ORDER — DIPHENHYDRAMINE 50 MG/ML INJECTION SOLUTION
50.0000 mg | Freq: Once | INTRAMUSCULAR | Status: DC | PRN
Start: 2022-12-10 — End: 2022-12-11

## 2022-12-10 MED ORDER — MEPERIDINE (PF) 25 MG/ML INJECTION SOLUTION
12.5000 mg | Freq: Once | INTRAMUSCULAR | Status: DC | PRN
Start: 2022-12-10 — End: 2022-12-11

## 2022-12-10 MED ORDER — HYDROCORTISONE SOD SUCCINATE 100 MG/2 ML VIAL WRAPPER
100.0000 mg | Freq: Once | INTRAMUSCULAR | Status: DC | PRN
Start: 2022-12-10 — End: 2022-12-11

## 2022-12-10 MED ORDER — ONDANSETRON HCL (PF) 4 MG/2 ML INJECTION SOLUTION
8.0000 mg | Freq: Once | INTRAMUSCULAR | Status: DC | PRN
Start: 2022-12-10 — End: 2022-12-11

## 2022-12-10 MED ORDER — EPINEPHRINE 1 MG/ML (1 ML) INJECTION SOLUTION
0.3000 mg | Freq: Once | INTRAMUSCULAR | Status: DC | PRN
Start: 2022-12-10 — End: 2022-12-11

## 2022-12-10 MED ORDER — DEXTROSE 5% IN WATER (D5W) FLUSH BAG - 250 ML
INTRAVENOUS | Status: DC | PRN
Start: 2022-12-10 — End: 2022-12-11

## 2022-12-10 MED ORDER — SODIUM CHLORIDE 0.9% FLUSH BAG - 250 ML
INTRAVENOUS | Status: DC | PRN
Start: 2022-12-10 — End: 2022-12-11

## 2022-12-10 MED ORDER — FAMOTIDINE (PF) 20 MG/2 ML INTRAVENOUS SOLUTION
20.0000 mg | Freq: Once | INTRAVENOUS | Status: DC | PRN
Start: 2022-12-10 — End: 2022-12-11

## 2022-12-10 NOTE — Nurses Notes (Signed)
[id number] Patient came in ambulatory after seeing Dr. Damita Lack at the clinic. Iv started. Keytruda given over 30 minutes. Tolerated well. Iv discontinued. Site clear. Left unit ambulatory with no c/o offered. Lovenia Kim, RN

## 2022-12-10 NOTE — Progress Notes (Signed)
Department of Hematology/Oncology  History and Physical    Name: Taylor Smith  VOZ:[id number]  Date of Birth: 1970/04/23  Encounter Date: 12/10/2022    REFERRING PROVIDER:  Macario Golds, FNP-C  [address]  [place],  [place] [address]    REASON FOR OFFICE VISIT:  New patient for evaluation and management of stage IV clear cell renal carcinoma    HISTORY OF PRESENT ILLNESS:Taylor Smith is Taylor 52 y.o. 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  She was originally diagnosed after having Taylor biopsy in the genitourinary area.  It came back consistent with clear cell renal cell carcinoma, which prompted an imaging study of the chest, abdomen, and pelvis.  She was found to have Taylor massive left kidney mass with numerous small pulmonary nodules.  The renal mass appears to be involving the renal vein but not the inferior vena cava.    Prior to this, she was not having any symptoms.  Specifically, she did not have any hematuria or history of iron-deficiency anemia.  She does report back pain which has been going on for the past year but it is unclear whether that was related or not.    02/07/2022: The patient is here for follow up of stage IV kidney cancer.  She states that she tolerated the 1st treatment well.  She did have some achiness of the legs which was pretty brief in nature.    02/28/2022: The patient is here for follow up of stage IV kidney cancer.  She states that she has some pain in her legs for Taylor few days following treatment.  She also reports that she is had Taylor cough which has been present since her most recent surgery.  Today will be her 3rd cycle of treatment, and she has Taylor CT scan scheduled for December 20.    03/21/2022:  The patient is here for follow up of metastatic renal cell carcinoma.  She was due for day 1 of cycle 4 of combination ipilimumab and nivolumab.  She was having some tolerability issues with the combination, but  is receiving additional premedications and states that it is helping to make the treatments easier.  She does not have any new issues at this time.    04/11/2022: The patient is here for follow up of metastatic renal cell cancer.  She has been on treatment with combination immunotherapy.  At last contact, her transaminases were mildly elevated.  On this visit, her bilirubin and transaminases are markedly higher.  She states that she did see Taylor surgeon yesterday for consideration of nephrectomy.  She indicates that there was not Taylor discussion regarding Taylor jaundiced appearance at that time.  She had CT imaging performed approximately 2 days ago.    04/18/2022: The patient is here for follow up of metastatic renal cell carcinoma.  Immunotherapy has been on hold due to adverse liver function testing.  She has been on prednisone in her numbers have improved considerably.  She did go to the emergency room for an episode of abdominal pain recently and was prescribed Bentyl.    06/13/2022:  She states that she recently received Inlyta, and has not been on it for long.  She has Taylor follow up visit with [facility] in early April.  She is having more issues with back pain, primarily when laying down.  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    07/11/2022: The patient is here for follow up of metastatic renal cell carcinoma.  She states that she will be having follow up as due on April 17, and Taylor CT scan will be performed around that time.  She states that there is some discussion about potentially doing surgery at some point if possible.    08/15/2022: The patient is here for follow up of metastatic renal cell carcinoma.  She reports some shortness of breath with exertion.  She has had prior CT angiogram which have not shown any evidence of PE, and she was recently assessed at  [facility] last week.  She does not have any significant anemia.  Her most  recent imaging study indicates that her cancer is decreasing in size.    09/29/2022: The patient is here for follow up renal cell carcinoma.  She states that she has discontinued the Inlyta in anticipation of surgery being performed next week.    11/10/2022:  The patient is here for follow up of renal cell carcinoma.  Since the last visit, she ended up having resection of the lesion.  Clear margins were obtained, and 5 lymph nodes were negative for involvement.  She also had no evidence of any lymphovascular invasion at the time.    12/10/2022: The patient is here for follow up of metastatic renal cell carcinoma.  She was seen at [facility] and has been told that she is essentially cancer-free at this time and no additional therapy was advised.    ROS:   Review of Systems   Constitutional:  Negative for appetite change, chills and fatigue.   HENT:   Negative for sore throat and trouble swallowing.    Eyes:  Negative for eye problems.   Respiratory:  Negative for cough and shortness of breath.    Cardiovascular:  Negative for chest pain and leg swelling.   Gastrointestinal:  Negative for abdominal pain.   Genitourinary:  Negative for dysuria and hematuria.    Musculoskeletal:  Negative for arthralgias and gait problem.   Skin:  Negative for rash.   Neurological:  Negative for gait problem.   Hematological:  Negative for adenopathy.   Psychiatric/Behavioral:  Negative for depression.         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)     Kidney cancer, primary, with metastasis from kidney to other site, left (CMS [facility])          Past Surgical History:   Procedure Laterality Date    HX CESAREAN SECTION      HX CHOLECYSTECTOMY      HX CYST REMOVAL Right     HX HYSTERECTOMY      NEPHROURETERECTOMY Left     TOTAL THYROIDECTOMY           Social History      Socioeconomic History    Marital status: Married     Spouse name: Not on file    Number of children: Not on file    Years of education: Not on file    Highest education level: Not on file   Occupational History    Not on file   Tobacco Use    Smoking status: Never    Smokeless tobacco: Never   Vaping Use  Vaping status: Never Used   Substance and Sexual Activity    Alcohol use: Not on file    Drug use: Never    Sexual activity: Yes   Other Topics Concern    Not on file   Social History Narrative    Not on file     Social Determinants of Health     Financial Resource Strain: Not on file   Transportation Needs: No Transportation Needs (10/10/2022)    [facility], [facility]    [facility] - Transportation     In the past 12 months, has lack of transportation kept you from medical appointments or from getting medications?: No     Lack of Transportation (Non-Medical): No   Social Connections: Not on file   Intimate Partner Violence: High Risk (01/13/2022)    Intimate Partner Violence     SDOH Domestic Violence: No   Housing Stability: Not on file     Family Medical History:       Problem Relation (Age of Onset)    Cancer Maternal Aunt, Maternal Grandmother    Colon Cancer Maternal Uncle    Lung Cancer Mother    Melanoma Maternal Aunt            Current Outpatient Medications   Medication Sig    ergocalciferol, vitamin D2, (DRISDOL) 1,250 mcg (50,000 unit) Oral Capsule Take 1 Capsule (50,000 Units total) by mouth Every 7 days    hydroCHLOROthiazide (MICROZIDE) 12.5 mg Oral Capsule TAKE 1 CAPSULE (12.5 MG TOTAL) BY MOUTH ONCE Taylor DAY INDICATIONS: HIGH BLOOD PRESSURE    Ibuprofen (MOTRIN) 600 mg Oral Tablet Take 1 Tablet (600 mg total) by mouth Every 6 hours as needed    levothyroxine (SYNTHROID) 137 mcg Oral Tablet Take 1 Tablet (137 mcg total) by mouth Every morning    omeprazole (PRILOSEC) 40 mg Oral Capsule, Delayed Release(E.C.) Take 1 Capsule (40 mg total) by mouth  Once Taylor day    pembrolizumab (KEYTRUDA) 25 mg/mL Intravenous Solution Infuse 8 mL (200 mg total) into Taylor venous catheter Every 21 days    valsartan (DIOVAN) 160 mg Oral Tablet Take 1 Tablet (160 mg total) by mouth Once Taylor day     Allergies   Allergen Reactions    Aldactone [Spironolactone] Itching       PHYSICAL EXAM:  Most Recent Vitals    Flowsheet Row Telemedicine from 01/13/2022 in Hematology/Oncology,   [facility]   Temperature 36.7 C (98.1 F) filed at... 01/13/2022 1431   Heart Rate 72 filed at... 01/13/2022 1431   Respiratory Rate --   BP (Non-Invasive) 146/73 filed at... 01/13/2022 1431   SpO2 97 % filed at... 01/13/2022 1431   Height 1.753 m (5\' 9" ) filed at... 01/13/2022 1431   Weight 94.2 kg (207 lb 9.6 oz) filed at... 01/13/2022 1431   BMI (Calculated) 30.72 filed at... 01/13/2022 1431   BSA (Calculated) 2.14 filed at... 01/13/2022 1431      ECOG Status: (0) Fully active, able to carry on all predisease performance without restriction   Physical Exam  Constitutional:       General: She is not in acute distress.     Appearance: Normal appearance.   Eyes:      Extraocular Movements: Extraocular movements intact.   Cardiovascular:      Rate and Rhythm: Normal rate and regular rhythm.   Pulmonary:      Effort: Pulmonary effort is normal.   Abdominal:  General: Abdomen is flat.      Palpations: Abdomen is soft.   Musculoskeletal:         General: Normal range of motion.      Cervical back: Normal range of motion.   Skin:     General: Skin is warm and dry.   Neurological:      General: No focal deficit present.      Mental Status: She is alert.   Psychiatric:         Mood and Affect: Mood normal.         DIAGNOSTIC DATA:  No results found for this or any previous visit (from the past 09811 hour(s)).    LABS:   CBC  Diff   Lab Results   Component Value Date/Time    WBC 3.6 (L) 12/10/2022 07:54 AM    HGB 14.9 (H) 12/10/2022 07:54 AM    HCT 43.5 (H) 12/10/2022 07:54 AM    PLTCNT 152 12/10/2022  07:54 AM    RBC 4.89 12/10/2022 07:54 AM    MCV 88.9 12/10/2022 07:54 AM    MCHC 34.3 12/10/2022 07:54 AM    MCH 30.5 12/10/2022 07:54 AM    RDW 12.8 12/10/2022 07:54 AM    MPV 10.2 12/10/2022 07:54 AM    Lab Results   Component Value Date/Time    PMNS 57 12/10/2022 07:54 AM    LYMPHOCYTES 28 12/10/2022 07:54 AM    EOSINOPHIL 5 12/10/2022 07:54 AM    MONOCYTES 9 12/10/2022 07:54 AM    BASOPHILS 1 12/10/2022 07:54 AM    BASOPHILS 0.00 12/10/2022 07:54 AM    PMNABS 2.10 12/10/2022 07:54 AM    LYMPHSABS 1.00 12/10/2022 07:54 AM    EOSABS 0.20 12/10/2022 07:54 AM    MONOSABS 0.30 12/10/2022 07:54 AM            ASSESSMENT:    ICD-10-CM    1. Kidney cancer, primary, with metastasis from kidney to other site, left (CMS HCC)  C64.2 Signatera           PLAN:   1. All relevant medical records were reviewed including available pertinent provider notes, procedure notes, imaging, laboratory, and pathology.   2. All pertinent labs and/or imaging were reviewed with the patient.   3. Stage IV renal carcinoma:  She has had Taylor great response to treatment, but has known pulmonary metastases which have not been definitively addressed.  We discussed obtaining Taylor Signatera test in order to determine whether any circulating tumor DNA is present, and this can help for treatment monitoring going forward.  I believe that at Taylor minimum, it would be reasonable to utilize pembrolizumab in Taylor pseudo adjuvant fashion for 12 months.  She would also meet criteria for treatment of metastatic disease, for which the medication would be used for 2 years.  We reviewed the information from the [facility] clinical trials in regard to survival.  She is interested in resuming immunotherapy and we will continue to monitor expectantly.  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  5. Pain:  Improved.      MENDE OWNBEY was given the chance to ask questions, and these were answered to their satisfaction. The patient is welcome to call with any questions or concerns  in the meantime.     On the day of the encounter, Taylor total of 35 minutes was spent on this patient encounter including review of historical information, examination, documentation and post-visit activities.   Return in about  3 weeks (around 12/31/2022).     Lupita Dawn, MD  12/10/2022, 09:23  The patient's insurance company bears full legal and financial responsibility resulting from any deviations that they cause to my recommended treatment plan.   CC:  Macario Golds, FNP-C  [address]  [place] [address]    Macario Golds, FNP-C  [address]  [place],  [place] [address]    This note was partially generated using MModal Fluency Direct system, and there may be some incorrect words, spellings, and punctuation that were not noted in checking the note before saving.

## 2022-12-10 NOTE — Nursing Note (Signed)
Signatera mailed at this time.

## 2022-12-11 ENCOUNTER — Ambulatory Visit (INDEPENDENT_AMBULATORY_CARE_PROVIDER_SITE_OTHER): Payer: Self-pay

## 2022-12-11 ENCOUNTER — Ambulatory Visit (INDEPENDENT_AMBULATORY_CARE_PROVIDER_SITE_OTHER): Payer: Self-pay | Admitting: HEMATOLOGY-ONCOLOGY

## 2022-12-18 LAB — SIGNATERA
SIGNATERA MTM READOUT: 0 MTM/ml
SIGNATERA TEST RESULT: NEGATIVE

## 2022-12-19 ENCOUNTER — Encounter (INDEPENDENT_AMBULATORY_CARE_PROVIDER_SITE_OTHER): Payer: Self-pay | Admitting: HEMATOLOGY-ONCOLOGY

## 2022-12-31 ENCOUNTER — Ambulatory Visit (INDEPENDENT_AMBULATORY_CARE_PROVIDER_SITE_OTHER)
Admission: RE | Admit: 2022-12-31 | Discharge: 2022-12-31 | Disposition: A | Discharge status: Home / Self Care | Payer: 59 | Source: Ambulatory Visit | Attending: NURSE PRACTITIONER | Admitting: NURSE PRACTITIONER

## 2022-12-31 ENCOUNTER — Ambulatory Visit (INDEPENDENT_AMBULATORY_CARE_PROVIDER_SITE_OTHER): Payer: 59 | Admitting: NURSE PRACTITIONER

## 2022-12-31 ENCOUNTER — Ambulatory Visit
Admission: RE | Admit: 2022-12-31 | Discharge: 2022-12-31 | Disposition: A | Discharge status: Home / Self Care | Payer: 59 | Source: Ambulatory Visit | Attending: NURSE PRACTITIONER | Admitting: NURSE PRACTITIONER

## 2022-12-31 ENCOUNTER — Encounter (INDEPENDENT_AMBULATORY_CARE_PROVIDER_SITE_OTHER): Payer: Self-pay | Admitting: HEMATOLOGY-ONCOLOGY

## 2022-12-31 ENCOUNTER — Other Ambulatory Visit: Payer: Self-pay

## 2022-12-31 ENCOUNTER — Encounter (INDEPENDENT_AMBULATORY_CARE_PROVIDER_SITE_OTHER): Payer: Self-pay | Admitting: NURSE PRACTITIONER

## 2022-12-31 VITALS — BP 138/91 | HR 67 | Temp 98.3°F | Ht 69.0 in | Wt 216.2 lb

## 2022-12-31 VITALS — BP 127/81 | HR 64 | Temp 98.6°F | Resp 20

## 2022-12-31 DIAGNOSIS — Z2989 Encounter for other specified prophylactic measures: Secondary | ICD-10-CM

## 2022-12-31 DIAGNOSIS — C7802 Secondary malignant neoplasm of left lung: Secondary | ICD-10-CM | POA: Insufficient documentation

## 2022-12-31 DIAGNOSIS — C7800 Secondary malignant neoplasm of unspecified lung: Secondary | ICD-10-CM

## 2022-12-31 DIAGNOSIS — C642 Malignant neoplasm of left kidney, except renal pelvis: Secondary | ICD-10-CM

## 2022-12-31 DIAGNOSIS — R0602 Shortness of breath: Secondary | ICD-10-CM

## 2022-12-31 DIAGNOSIS — Z5112 Encounter for antineoplastic immunotherapy: Secondary | ICD-10-CM | POA: Insufficient documentation

## 2022-12-31 DIAGNOSIS — G8929 Other chronic pain: Secondary | ICD-10-CM

## 2022-12-31 DIAGNOSIS — Z905 Acquired absence of kidney: Secondary | ICD-10-CM | POA: Insufficient documentation

## 2022-12-31 DIAGNOSIS — Z7901 Long term (current) use of anticoagulants: Secondary | ICD-10-CM

## 2022-12-31 LAB — COMPREHENSIVE METABOLIC PANEL, NON-FASTING
ALBUMIN/GLOBULIN RATIO: 1.4 (ref 0.8–1.4)
ALBUMIN: 4.3 g/dL (ref 3.5–5.7)
ALKALINE PHOSPHATASE: 65 U/L (ref 34–104)
ALT (SGPT): 37 U/L (ref 7–52)
AST (SGOT): 33 U/L (ref 13–39)
BILIRUBIN TOTAL: 1 mg/dL (ref 0.3–1.0)
BUN/CREA RATIO: 10 (ref 6–22)
BUN: 10 mg/dL (ref 7–25)
CALCIUM, CORRECTED: 9.3 mg/dL (ref 8.9–10.8)
CALCIUM: 9.5 mg/dL (ref 8.6–10.3)
CHLORIDE: 109 mmol/L — ABNORMAL HIGH (ref 98–107)
CO2 TOTAL: 32 mmol/L — ABNORMAL HIGH (ref 21–31)
CREATININE: 1.05 mg/dL (ref 0.60–1.30)
ESTIMATED GFR: 64 mL/min/{1.73_m2} (ref >59)
GLOBULIN: 3 (ref 2.9–5.4)
GLUCOSE: 112 mg/dL — ABNORMAL HIGH (ref 74–109)
OSMOLALITY, CALCULATED: 274 mOsm/kg (ref 270–290)
POTASSIUM: 3.6 mmol/L (ref 3.5–5.1)
PROTEIN TOTAL: 7.3 g/dL (ref 6.4–8.9)
SODIUM: 137 mmol/L (ref 136–145)

## 2022-12-31 LAB — THYROID STIMULATING HORMONE WITH FREE T4 REFLEX: TSH: 8.551 u[IU]/mL — ABNORMAL HIGH (ref 0.450–5.330)

## 2022-12-31 LAB — CBC WITH DIFF
BASOPHIL #: 0 10*3/uL (ref 0.00–0.10)
BASOPHIL %: 1 % (ref 0–1)
EOSINOPHIL #: 0.2 10*3/uL (ref 0.00–0.50)
EOSINOPHIL %: 5 % (ref 1–7)
HCT: 41.9 % (ref 31.2–41.9)
HGB: 14.2 g/dL (ref 10.9–14.3)
LYMPHOCYTE #: 1 10*3/uL (ref 1.00–3.00)
LYMPHOCYTE %: 28 % (ref 16–44)
MCH: 30.1 pg (ref 24.7–32.8)
MCHC: 34 g/dL (ref 32.3–35.6)
MCV: 88.6 fL (ref 75.5–95.3)
MONOCYTE #: 0.3 10*3/uL (ref 0.30–1.00)
MONOCYTE %: 8 % (ref 5–13)
MPV: 10 fL (ref 7.9–10.8)
NEUTROPHIL #: 2 10*3/uL (ref 1.85–7.80)
NEUTROPHIL %: 58 % (ref 43–77)
PLATELETS: 146 10*3/uL (ref 140–440)
RBC: 4.73 10*6/uL (ref 3.63–4.92)
RDW: 13.4 % (ref 12.3–17.7)
WBC: 3.5 10*3/uL — ABNORMAL LOW (ref 3.8–11.8)

## 2022-12-31 LAB — MAGNESIUM: MAGNESIUM: 1.8 mg/dL — ABNORMAL LOW (ref 1.9–2.7)

## 2022-12-31 LAB — THYROXINE, FREE (FREE T4): THYROXINE (T4), FREE: 0.7 ng/dL (ref 0.58–1.64)

## 2022-12-31 MED ORDER — MEPERIDINE (PF) 25 MG/ML INJECTION SOLUTION
12.5000 mg | Freq: Once | INTRAMUSCULAR | Status: DC | PRN
Start: 2022-12-31 — End: 2023-01-01

## 2022-12-31 MED ORDER — ONDANSETRON HCL 8 MG TABLET
8.0000 mg | ORAL_TABLET | Freq: Once | ORAL | Status: DC | PRN
Start: 2022-12-31 — End: 2023-01-01

## 2022-12-31 MED ORDER — ONDANSETRON HCL (PF) 4 MG/2 ML INJECTION SOLUTION
8.0000 mg | Freq: Once | INTRAMUSCULAR | Status: DC | PRN
Start: 2022-12-31 — End: 2023-01-01

## 2022-12-31 MED ORDER — DIPHENHYDRAMINE 50 MG/ML INJECTION SOLUTION
50.0000 mg | Freq: Once | INTRAMUSCULAR | Status: DC | PRN
Start: 2022-12-31 — End: 2023-01-01

## 2022-12-31 MED ORDER — SODIUM CHLORIDE 0.9% FLUSH BAG - 250 ML
INTRAVENOUS | Status: DC | PRN
Start: 2022-12-31 — End: 2023-01-01

## 2022-12-31 MED ORDER — ALBUTEROL SULFATE HFA 90 MCG/ACTUATION AEROSOL INHALER - RN
2.0000 | Freq: Once | RESPIRATORY_TRACT | Status: DC | PRN
Start: 2022-12-31 — End: 2023-01-01

## 2022-12-31 MED ORDER — DEXTROSE 5% IN WATER (D5W) FLUSH BAG - 250 ML
INTRAVENOUS | Status: DC | PRN
Start: 2022-12-31 — End: 2023-01-01

## 2022-12-31 MED ORDER — FAMOTIDINE (PF) 20 MG/2 ML INTRAVENOUS SOLUTION
20.0000 mg | Freq: Once | INTRAVENOUS | Status: DC | PRN
Start: 2022-12-31 — End: 2023-01-01

## 2022-12-31 MED ORDER — EPINEPHRINE 1 MG/ML (1 ML) INJECTION SOLUTION
0.3000 mg | Freq: Once | INTRAMUSCULAR | Status: DC | PRN
Start: 2022-12-31 — End: 2023-01-01

## 2022-12-31 MED ORDER — DIPHENHYDRAMINE 50 MG/ML INJECTION SOLUTION
25.0000 mg | Freq: Once | INTRAMUSCULAR | Status: DC | PRN
Start: 2022-12-31 — End: 2023-01-01

## 2022-12-31 MED ORDER — HYDROCORTISONE SOD SUCCINATE 100 MG/2 ML VIAL WRAPPER
100.0000 mg | Freq: Once | INTRAMUSCULAR | Status: DC | PRN
Start: 2022-12-31 — End: 2023-01-01

## 2022-12-31 MED ORDER — ALBUTEROL SULFATE 2.5 MG/3 ML (0.083 %) SOLUTION FOR NEBULIZATION
2.5000 mg | INHALATION_SOLUTION | Freq: Once | RESPIRATORY_TRACT | Status: DC | PRN
Start: 2022-12-31 — End: 2023-01-01

## 2022-12-31 MED ORDER — SODIUM CHLORIDE 0.9 % INTRAVENOUS SOLUTION
200.0000 mg | Freq: Once | INTRAVENOUS | Status: AC
Start: 2022-12-31 — End: 2022-12-31
  Administered 2022-12-31: 200 mg via INTRAVENOUS
  Administered 2022-12-31: 0 mg via INTRAVENOUS
  Filled 2022-12-31: qty 8

## 2022-12-31 NOTE — Cancer Center Note (Signed)
Department of Hematology/Oncology      Name: Taylor Smith  ONG:[id number]  Date of Birth: 1970-11-18  Encounter Date: 12/31/2022    REFERRING PROVIDER:  Macario Golds, FNP-C  [address]  [place],  [place] [address]    REASON FOR OFFICE VISIT:  management of stage IV clear cell renal carcinoma    HISTORY OF PRESENT ILLNESS:A  Taylor Smith is a 52 y.o. 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      ONCOLOGY HISTORY:  She was originally diagnosed after having a biopsy in the genitourinary area.  It came back consistent with clear cell renal cell carcinoma, which prompted an imaging study of the chest, abdomen, and pelvis.  She was found to have a massive left kidney mass with numerous small pulmonary nodules.  The renal mass appears to be involving the renal vein but not the inferior vena cava. Prior to this, she was not having any symptoms.  Specifically, she did not have any hematuria or history of iron-deficiency anemia.  She does report back pain which has been going on for the past year but it is unclear whether that was related or not.  She has had a Left cytoreductive Nephrectomy in 5/24 with clear margins and 5/5 negative for involvement. he also had no evidence of any lymphovascular invasion at the time.     12/10/2022: The patient is here for follow up of metastatic renal cell carcinoma.  She was seen at [facility] and has been told that she is essentially cancer-free at this time and no additional therapy was advised.    12/31/22:  Patient is here for follow up of metastatic renal cell carcinoma.  She is due for [facility] today.   She reports that she has shortness of breath mostly with exertion but sometimes without exertion.   She reports that otherwise she is feeling well.  She denies any issues with diarrhea, increased cough, or abdominal pain.      ROS:   Review of Systems   Constitutional:  Positive for fatigue. Negative for appetite  change and chills.   HENT:   Negative for sore throat and trouble swallowing.    Eyes:  Negative for eye problems.   Respiratory:  Positive for shortness of breath. Negative for cough.    Cardiovascular:  Negative for chest pain and leg swelling.   Gastrointestinal:  Negative for abdominal pain, constipation, diarrhea, nausea and vomiting.   Genitourinary:  Negative for difficulty urinating, dyspareunia, dysuria and hematuria.    Musculoskeletal:  Positive for arthralgias (knees mostly). Negative for gait problem.   Skin:  Negative for rash.   Neurological:  Negative for gait problem.   Hematological:  Negative for adenopathy.   Psychiatric/Behavioral:  Negative for depression.         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)     Kidney cancer, primary, with metastasis from kidney to other site, left (CMS [facility])          Past Surgical History:   Procedure Laterality Date    HX CESAREAN SECTION      HX CHOLECYSTECTOMY      HX CYST REMOVAL Right     HX HYSTERECTOMY      NEPHROURETERECTOMY Left  TOTAL THYROIDECTOMY           Social History     Socioeconomic History    Marital status: Married     Spouse name: Not on file    Number of children: Not on file    Years of education: Not on file    Highest education level: Not on file   Occupational History    Not on file   Tobacco Use    Smoking status: Never    Smokeless tobacco: Never   Vaping Use    Vaping status: Never Used   Substance and Sexual Activity    Alcohol use: Not on file    Drug use: Never    Sexual activity: Yes   Other Topics Concern    Not on file   Social History Narrative    Not on file     Social Determinants of Health     Financial Resource Strain: Not on file   Transportation Needs: No Transportation Needs (10/10/2022)    Received from [facility]    PRAPARE - Transportation     In the past 12 months, has lack of transportation kept you from medical appointments or from getting medications?: No     Lack of Transportation (Non-Medical): No   Social Connections: Not on file   Intimate Partner Violence: High Risk (01/13/2022)    Intimate Partner Violence     SDOH Domestic Violence: No   Housing Stability: Not on file     Family Medical History:       Problem Relation (Age of Onset)    Cancer Maternal Aunt, Maternal Grandmother    Colon Cancer Maternal Uncle    Lung Cancer Mother    Melanoma Maternal Aunt            Current Outpatient Medications   Medication Sig    ergocalciferol, vitamin D2, (DRISDOL) 1,250 mcg (50,000 unit) Oral Capsule Take 1 Capsule (50,000 Units total) by mouth Every 7 days    hydroCHLOROthiazide (MICROZIDE) 12.5 mg Oral Capsule TAKE 1 CAPSULE (12.5 MG TOTAL) BY MOUTH ONCE A DAY INDICATIONS: HIGH BLOOD PRESSURE    Ibuprofen (MOTRIN) 600 mg Oral Tablet Take 1 Tablet (600 mg total) by mouth Every 6 hours as needed    levothyroxine (SYNTHROID) 137 mcg Oral Tablet Take 1 Tablet (137 mcg total) by mouth Every morning    omeprazole (PRILOSEC) 40 mg Oral Capsule, Delayed Release(E.C.) Take 1 Capsule (40 mg total) by mouth Once a day    pembrolizumab (KEYTRUDA) 25 mg/mL Intravenous Solution Infuse 8 mL (200 mg total) into a venous catheter Every 21 days    valsartan (DIOVAN) 160 mg Oral Tablet Take 1 Tablet (160 mg total) by mouth Once a day     Allergies   Allergen Reactions    Aldactone [Spironolactone] Itching       PHYSICAL EXAM:  BP (!) 138/91 (Site: Left Arm, Patient Position: Sitting, Cuff Size: Adult)   Pulse 67   Temp 36.8 C (98.3 F) (Temporal)   Ht 1.753 m (5\' 9" )   Wt 98.1 kg (216 lb 3.2 oz)   SpO2 99%   BMI 31.93 kg/m         ECOG Status: (0) Fully active, able to carry on all predisease performance without restriction   Physical Exam  Vitals reviewed.   Constitutional:       Appearance: Normal appearance. She is  normal weight.   Cardiovascular:  Rate and Rhythm: Normal rate and regular rhythm.      Pulses: Normal pulses.      Heart sounds: Normal heart sounds.   Pulmonary:      Effort: Pulmonary effort is normal.      Breath sounds: Normal breath sounds.   Abdominal:      General: Bowel sounds are normal.      Palpations: Abdomen is soft.   Lymphadenopathy:      Head:      Right side of head: No submental, submandibular, tonsillar, preauricular, posterior auricular or occipital adenopathy.      Left side of head: No submental, submandibular, tonsillar, preauricular, posterior auricular or occipital adenopathy.      Cervical: No cervical adenopathy.      Right cervical: No superficial, deep or posterior cervical adenopathy.     Left cervical: No superficial, deep or posterior cervical adenopathy.      Upper Body:      Right upper body: No supraclavicular or axillary adenopathy.      Left upper body: No supraclavicular or axillary adenopathy.   Skin:     General: Skin is warm and dry.      Capillary Refill: Capillary refill takes less than 2 seconds.   Neurological:      Mental Status: She is alert and oriented to person, place, and time. Mental status is at baseline.         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     Technique:  CT imaging of the chest, abdomen, and pelvis was performed   following the administration of IV contrast. Imaging  was performed through   the kidneys before the administration of contrast, followed by arterial   phase imaging through the liver, and portal venous phase imaging of the   chest, abdomen, and pelvis.   Iodinated contrast was used due to the   indications for the examination, to improve disease detection and to   further define anatomy.   Coronal and sagittal reformatted images of the   abdomen and pelvis were generated and reviewed. 3-D maximum intensity   projection (MIP) reconstructions of the chest were performed to potentially   increase study sensitivity.     Findings:   Chest:   - Chest wall and Thoracic Inlet: No masses or lymphadenopathy.   Thyroidectomy changes.     - Mediastinum and Hila: No masses or lymphadenopathy.     - Thoracic Vessels: Normal caliber of the thoracic aorta and main pulmonary   artery. Mild atherosclerotic plaque.     - Heart and Pericardium: Normal heart size.  No pericardial effusion.     - Lungs and Airways: Small poorly nodules are unchanged from prior. For   example measuring 5 mm in the right lower lobe along the fissure on series   7 image 276. No new suspicious or enlarging nodule.     - Pleura: No  pleural effusions.       Abdomen and pelvis:     - Liver: Normal in morphology and enhancement.  No suspicious hepatic   masses are identified.  The portal and hepatic veins are patent.     - Biliary and Gallbladder: No intrahepatic or extrahepatic bile duct   dilatation. The gallbladder is surgically absent.     - Spleen: Normal in appearance.      - Pancreas: Normal in appearance.     - Adrenal Glands: [Colectomy.     - Kidneys: Postsurgical changes from interval total left nephrectomy. No   suspicious soft tissue nodularity or enhancement along the operative site.   No new suspicious renal lesions. No hydronephrosis.     - Abdominal and Pelvic Vasculature: No abdominal aortic aneurysm. Improved   collateral vessels in the left upper quadrant.     - Gastrointestinal Tract: No  abnormal dilation or wall thickening.     - Peritoneum/Mesentery/Retroperitoneum: No free fluid.  No free   intraperitoneal air.     - Lymph Nodes: No retroperitoneal or mesenteric lymphadenopathy.      - Bladder: Normal in appearance.     - Pelvic Organs: Unremarkable.     - Body Wall: Healed midline laparotomy incision.     - Musculoskeletal:  Unchanged partially sclerotic T1 and T3 vertebral body   lesions. No new suspicious osseous lesion.       Impression:   1. Interval post procedure changes from total left nephrectomy. There is   no evidence of local residual disease.   2.  No new evidence of metastasis in the chest, abdomen, or pelvis.       Electronically Reviewed by:  Manus Rudd, MD, [facility]   Electronically Reviewed on:  12/02/2022 1:26 PM     I have reviewed the images and concur with the above findings.     Electronically Signed by:  Morton Stall, MD, [facility]   Electronically Signed on:  12/02/2022 2:33 PM     LABS:  CBC  Diff   Lab Results   Component Value Date/Time    WBC 3.5 (L) 12/31/2022 08:05 AM    HGB 14.2 12/31/2022 08:05 AM    HCT 41.9 12/31/2022 08:05 AM    PLTCNT 146 12/31/2022 08:05 AM    RBC 4.73 12/31/2022 08:05 AM    MCV 88.6 12/31/2022 08:05 AM    MCHC 34.0 12/31/2022 08:05 AM    MCH 30.1 12/31/2022 08:05 AM    RDW 13.4 12/31/2022 08:05 AM    MPV 10.0 12/31/2022 08:05 AM    Lab Results   Component Value Date/Time    PMNS 58 12/31/2022 08:05 AM    LYMPHOCYTES 28 12/31/2022 08:05 AM    EOSINOPHIL 5 12/31/2022 08:05 AM    MONOCYTES 8 12/31/2022 08:05 AM    BASOPHILS 1 12/31/2022 08:05 AM    BASOPHILS 0.00 12/31/2022 08:05 AM    PMNABS 2.00 12/31/2022 08:05 AM    LYMPHSABS 1.00 12/31/2022 08:05 AM    EOSABS 0.20 12/31/2022 08:05 AM    MONOSABS 0.30 12/31/2022 08:05 AM            Comprehensive Metabolic Profile    Lab Results   Component Value Date    SODIUM 141 12/10/2022    POTASSIUM 3.7 12/10/2022    CHLORIDE 106 12/10/2022    CO2 26 12/10/2022    ANIONGAP 9 12/10/2022     BUN 9 12/10/2022    CREATININE 1.10 12/10/2022  ALBUMIN 4.4 12/10/2022    CALCIUM 9.8 12/10/2022    GLUCOSENF 104 12/10/2022    ALKPHOS 62 12/10/2022    ALT 39 12/10/2022    AST 33 12/10/2022    TOTBILIRUBIN 0.8 12/10/2022    TOTALPROTEIN 7.4 12/10/2022         ESTIMATED GFR   Date Value Ref Range Status   12/10/2022 60 >59 mL/min/1.58m^2 Final       SIGNATERA TEST RESULT   Date Value Ref Range Status   12/10/2022 NEGATIVE  Final     SIGNATERA MTM READOUT   Date Value Ref Range Status   12/10/2022 0 MTM/ml Final     Comment:     Limitations  Signatera is a personalized, tumor-informed test for the longitudinal detection of circulating tumor DNA (ctDNA). Interval testing is recommended for all patients. Studies have demonstrated that when ctDNA is detected (Signatera Positive) following surgery or definitive treatment, the risk for disease relapse is high without further treatment. Conversely, when ctDNA is not detected, the patient may be considered at lower risk for relapse. For those with multiple timepoints, upward trending ctDNA levels are suggestive of increasing tumor burden (1,2). For a single time point in isolation, the absolute MTM/mL value has no known clinical significance and should not be compared across patients. Test results should be interpreted in context of other clinicopathological features. ctDNA detection sensitivity may be limited due to blood collection within two weeks of surgery and while the patient is on therapy. Signatera is a quantitative test and reports in units of mean tumor molecules per   ml (MTM/mL), which is comprised of three measured components (plasma volume, cell free DNA (cfDNA) concentration, and Variant Allele Frequency (VAF)). The MTM/mL number will be qualified if any measured component falls outside the analytical measurement range for that component. The analytical sensitivity is 95% at the limit of detection (0.3 MTM/mL). Results obtained are specific to the  assessed time point. A negative test result does not definitively indicate the absence of cancer. This test is not designed to detect or report germline variation, nor does it infer hereditary cancer risk for the patient. Each Signatera assay is designed to a single tumor for a given patient. At this time, multiple personalized Signatera assays cannot be developed for the same patient. This test is designed to detect ctDNA from the assayed tumor only; new primary tumors will not be detected. There is a low risk that a new primary may share a variant that could interfere with the Signatera test.   Testing cannot be performed in patients who are pregnant, have a history of bone marrow transplant, or history of blood transfusion within three months. This test is expected to have limited sensitivity in cancer types such as GIST, renal cell carcinomas, primary brain tumors, and lymphoma due to limited ctDNA shed. 1 Bratman SV, [place] Senate, Iafolla MAJ, et al. Personalized circulating tumor DNA analysis as a predictive biomarker in solid tumor patients treated with pembrolizumab. Nature Cancer. 2020;1(9):873-881. 2 Henriksen TV, Veronia Beets, et al., Circulating Tumor DNA in Stage III Colorectal Cancer, beyond Minimal Residual Disease Detection, toward Assessment of Adjuvant Therapy Efficacy and Clinical Behavior of Recurrences. Clin Cancer Res. 2021; 28(3):507-517.  Methodology  FFPE samples are assessed by a pathologist to identify tumor margins and percent tumor content. Tumor DNA is extracted using Qiagen AllPrep. Whole genomic DNA is isolated from peripheral blood using QIAamp DNA Blood Mini Kit to provide a baseline DNA sequence. Circulating tumor DNA (ctDNA)  is extracted from plasma derived from whole blood samples collected in cell-free DNA blood tubes (Streck) using the QIAsymphony automated or manual extraction method (Qiagen). Using a proprietary algorithm, putative, clonal variants present in the tumor but absent  in the germline DNA are identified to design the customized multiplex PCR assay. Whole-exome sequencing is performed on tumor and peripheral blood DNA using KAPA HyperPrep Warden/ranger (Roche) with a custom xGen exome capture (IDT). Using a proprietary algorithm, putative clonal variants present in the tumor but absent in the germline DNA are identified to design the customized multiplex PCR assay. Circulating tumor DNA is extracted from   plasma collected in Streck tubes using [employer] proprietary methods. The customized PCR assays are run to detect presence or absence of these variants within circulating plasma. A patient's plasma sample is considered ctDNA positive when at least two individual-specific tumor variants are detected. When fewer than two individual-specific tumor variants are observed, a negative result is issued. Tumor variation outside of the individual, tumor specific variants is not assessed. Pathology services and whole exome sequencing are performed at [facility] (CLIA ID# [id number]) [address] [place], [place] [address].  Disclaimer  The extraction, Designer, television/film set, and sequencing for this test were performed by [facility] [address], [place], [place] [address] (CLIA [place] [id number]). The data analysis and reporting for this test were performed by [facility] [address], [place], [place] [address] (CLIA [place] [id number]). This test was developed and its performance characteristics determined by [facility]. The test has not been cleared or approved by the U.S. Food and Drug Administration (FDA). CAP accredited, ISO 13485 certified, and CLIA certified. Pathology services and whole exome sequencing for this test were performed by [facility], [address], [place] [address], CLIA # [id number].  2021 [employer] All Rights Reserved.         ASSESSMENT:    ICD-10-CM    1. Kidney cancer, primary, with metastasis from kidney to other site, left (CMS HCC)  C64.2       2.  Shortness of breath  R06.02       3. Encounter for immunotherapy  Z29.89              PLAN:   1. All relevant medical records were reviewed including available pertinent provider notes, procedure notes, imaging, laboratory, and pathology.   2. All pertinent labs and/or imaging were reviewed with the patient.   3. Stage IV renal carcinoma:  She has had a great response to treatment, but has known pulmonary metastases which have not been definitively addressed.   Signatera was negative on 12/10/22.   Dr. Damita Lack believes that at a minimum, it would be reasonable to utilize pembrolizumab in a pseudo adjuvant fashion for 12 months.  She would also meet criteria for treatment of metastatic disease, for which the medication would be used for 2 years.  We reviewed the information from the [facility] clinical trials in regard to survival.   Keytruda was restarted after discussion with Dr. Damita Lack.   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  5. Pain:  Improved.      Taylor Smith was given the chance to ask questions, and these were answered to their satisfaction. The patient is welcome to call with any questions or concerns in the meantime.     On the day of the encounter, a total of 30 minutes was spent on this patient encounter including review  of historical information, examination, documentation and post-visit activities.   Return in about 3 weeks (around 01/21/2023).     Marvene Staff APRN, FNP-BC, AOCNP, 12/31/2022 , 08:48   You can see your note(s) in [facility]. It is common for you to encounter certain medical terminology which may be unfamiliar to you. You might see results before your provider does so please give at least 2 business days for review. Please have this understanding, that NOT all abnormal results are significant. Our office will contact you for any urgent or emergent action if necessary. If you have any questions or concerns, feel free to send a MyChart message or call the office. Please call with any  new or concerning symptoms.     The patient's insurance company bears full legal and financial responsibility resulting from any deviations that they cause to my recommended treatment plan.   CC:  Macario Golds, FNP-C  [address]  [place] [address]    Macario Golds, FNP-C  [address]  [place],  [place] [address]    This note was partially generated using MModal Fluency Direct system, and there may be some incorrect words, spellings, and punctuation that were not noted in checking the note before saving.

## 2022-12-31 NOTE — Nurses Notes (Signed)
[id number] Patient came in ambulatory after seeing Marvene Staff, NP at the office. She waited at the office until French Ana gave her the go ahead with treatment after looking at her labs. Iv started. Keytruda infusion given over 30 minutes. Tolerated well. IV discontinued. Site clear. Left unit ambulatory with no c/o offered. Lovenia Kim, RN

## 2023-01-12 ENCOUNTER — Other Ambulatory Visit (INDEPENDENT_AMBULATORY_CARE_PROVIDER_SITE_OTHER): Payer: Self-pay

## 2023-01-12 ENCOUNTER — Encounter (INDEPENDENT_AMBULATORY_CARE_PROVIDER_SITE_OTHER): Payer: Self-pay | Admitting: HEMATOLOGY-ONCOLOGY

## 2023-01-15 ENCOUNTER — Encounter (INDEPENDENT_AMBULATORY_CARE_PROVIDER_SITE_OTHER): Payer: Self-pay | Admitting: HEMATOLOGY-ONCOLOGY

## 2023-01-20 ENCOUNTER — Encounter (INDEPENDENT_AMBULATORY_CARE_PROVIDER_SITE_OTHER): Payer: Self-pay | Admitting: HEMATOLOGY-ONCOLOGY

## 2023-01-21 ENCOUNTER — Encounter (INDEPENDENT_AMBULATORY_CARE_PROVIDER_SITE_OTHER): Payer: Self-pay | Admitting: HEMATOLOGY-ONCOLOGY

## 2023-01-21 ENCOUNTER — Other Ambulatory Visit: Payer: Self-pay

## 2023-01-21 ENCOUNTER — Encounter (HOSPITAL_COMMUNITY): Payer: Self-pay

## 2023-01-21 ENCOUNTER — Ambulatory Visit
Admission: RE | Admit: 2023-01-21 | Discharge: 2023-01-21 | Disposition: A | Discharge status: Home / Self Care | Payer: 59 | Source: Ambulatory Visit | Attending: HEMATOLOGY-ONCOLOGY | Admitting: HEMATOLOGY-ONCOLOGY

## 2023-01-21 ENCOUNTER — Encounter (INDEPENDENT_AMBULATORY_CARE_PROVIDER_SITE_OTHER): Payer: Self-pay | Admitting: NURSE PRACTITIONER

## 2023-01-21 ENCOUNTER — Other Ambulatory Visit (INDEPENDENT_AMBULATORY_CARE_PROVIDER_SITE_OTHER): Payer: Self-pay | Admitting: NURSE PRACTITIONER

## 2023-01-21 ENCOUNTER — Ambulatory Visit (INDEPENDENT_AMBULATORY_CARE_PROVIDER_SITE_OTHER): Payer: 59 | Admitting: NURSE PRACTITIONER

## 2023-01-21 ENCOUNTER — Inpatient Hospital Stay (INDEPENDENT_AMBULATORY_CARE_PROVIDER_SITE_OTHER)
Admission: RE | Admit: 2023-01-21 | Discharge: 2023-01-21 | Disposition: A | Discharge status: Home / Self Care | Payer: 59 | Source: Ambulatory Visit | Attending: NURSE PRACTITIONER | Admitting: NURSE PRACTITIONER

## 2023-01-21 VITALS — BP 158/96 | HR 64 | Temp 98.9°F | Resp 18

## 2023-01-21 VITALS — BP 136/93 | HR 69 | Temp 98.7°F | Ht 69.0 in | Wt 216.8 lb

## 2023-01-21 DIAGNOSIS — C642 Malignant neoplasm of left kidney, except renal pelvis: Secondary | ICD-10-CM

## 2023-01-21 DIAGNOSIS — Z7901 Long term (current) use of anticoagulants: Secondary | ICD-10-CM

## 2023-01-21 DIAGNOSIS — I10 Essential (primary) hypertension: Secondary | ICD-10-CM

## 2023-01-21 DIAGNOSIS — C7802 Secondary malignant neoplasm of left lung: Secondary | ICD-10-CM | POA: Insufficient documentation

## 2023-01-21 DIAGNOSIS — Z2989 Encounter for other specified prophylactic measures: Secondary | ICD-10-CM

## 2023-01-21 DIAGNOSIS — I823 Embolism and thrombosis of renal vein: Secondary | ICD-10-CM

## 2023-01-21 DIAGNOSIS — Z5112 Encounter for antineoplastic immunotherapy: Secondary | ICD-10-CM | POA: Insufficient documentation

## 2023-01-21 LAB — COMPREHENSIVE METABOLIC PANEL, NON-FASTING
ALBUMIN/GLOBULIN RATIO: 1.7 — ABNORMAL HIGH (ref 0.8–1.4)
ALBUMIN: 4.5 g/dL (ref 3.5–5.7)
ALKALINE PHOSPHATASE: 73 U/L (ref 34–104)
ALT (SGPT): 31 U/L (ref 7–52)
ANION GAP: 5 mmol/L (ref 4–13)
AST (SGOT): 31 U/L (ref 13–39)
BILIRUBIN TOTAL: 0.8 mg/dL (ref 0.3–1.0)
BUN/CREA RATIO: 9 (ref 6–22)
BUN: 10 mg/dL (ref 7–25)
CALCIUM, CORRECTED: 8.9 mg/dL (ref 8.9–10.8)
CALCIUM: 9.3 mg/dL (ref 8.6–10.3)
CHLORIDE: 107 mmol/L (ref 98–107)
CO2 TOTAL: 28 mmol/L (ref 21–31)
CREATININE: 1.17 mg/dL (ref 0.60–1.30)
ESTIMATED GFR: 56 mL/min/{1.73_m2} — ABNORMAL LOW (ref >59)
GLOBULIN: 2.7 — ABNORMAL LOW (ref 2.9–5.4)
GLUCOSE: 140 mg/dL — ABNORMAL HIGH (ref 74–109)
OSMOLALITY, CALCULATED: 281 mosm/kg (ref 270–290)
POTASSIUM: 3.8 mmol/L (ref 3.5–5.1)
PROTEIN TOTAL: 7.2 g/dL (ref 6.4–8.9)
SODIUM: 140 mmol/L (ref 136–145)

## 2023-01-21 LAB — CBC WITH DIFF
BASOPHIL #: 0 10*3/uL (ref 0.00–0.10)
BASOPHIL %: 1 % (ref 0–1)
EOSINOPHIL #: 0.2 10*3/uL (ref 0.00–0.50)
EOSINOPHIL %: 5 % (ref 1–7)
HCT: 41.4 % (ref 31.2–41.9)
HGB: 14.2 g/dL (ref 10.9–14.3)
LYMPHOCYTE #: 1.1 10*3/uL (ref 1.00–3.00)
LYMPHOCYTE %: 30 % (ref 16–44)
MCH: 30.4 pg (ref 24.7–32.8)
MCHC: 34.4 g/dL (ref 32.3–35.6)
MCV: 88.4 fL (ref 75.5–95.3)
MONOCYTE #: 0.2 10*3/uL — ABNORMAL LOW (ref 0.30–1.00)
MONOCYTE %: 7 % (ref 5–13)
MPV: 10.2 fL (ref 7.9–10.8)
NEUTROPHIL #: 2.2 10*3/uL (ref 1.85–7.80)
NEUTROPHIL %: 58 % (ref 43–77)
PLATELETS: 144 10*3/uL (ref 140–440)
RBC: 4.68 10*6/uL (ref 3.63–4.92)
RDW: 14 % (ref 12.3–17.7)
WBC: 3.7 10*3/uL — ABNORMAL LOW (ref 3.8–11.8)

## 2023-01-21 LAB — THYROID STIMULATING HORMONE WITH FREE T4 REFLEX: TSH: 3.654 u[IU]/mL (ref 0.450–5.330)

## 2023-01-21 LAB — MAGNESIUM: MAGNESIUM: 1.8 mg/dL — ABNORMAL LOW (ref 1.9–2.7)

## 2023-01-21 MED ORDER — ALBUTEROL SULFATE HFA 90 MCG/ACTUATION AEROSOL INHALER - RN
2.0000 | Freq: Once | RESPIRATORY_TRACT | Status: DC | PRN
Start: 2023-01-21 — End: 2023-01-22

## 2023-01-21 MED ORDER — HYDROCORTISONE SOD SUCCINATE 100 MG/2 ML VIAL WRAPPER
100.0000 mg | Freq: Once | INTRAMUSCULAR | Status: DC | PRN
Start: 2023-01-21 — End: 2023-01-22

## 2023-01-21 MED ORDER — FAMOTIDINE (PF) 20 MG/2 ML INTRAVENOUS SOLUTION
20.0000 mg | Freq: Once | INTRAVENOUS | Status: DC | PRN
Start: 2023-01-21 — End: 2023-01-22

## 2023-01-21 MED ORDER — EPINEPHRINE 1 MG/ML (1 ML) INJECTION SOLUTION
0.3000 mg | Freq: Once | INTRAMUSCULAR | Status: DC | PRN
Start: 2023-01-21 — End: 2023-01-22

## 2023-01-21 MED ORDER — ALBUTEROL SULFATE 2.5 MG/3 ML (0.083 %) SOLUTION FOR NEBULIZATION
2.5000 mg | INHALATION_SOLUTION | Freq: Once | RESPIRATORY_TRACT | Status: DC | PRN
Start: 2023-01-21 — End: 2023-01-22

## 2023-01-21 MED ORDER — DEXTROSE 5% IN WATER (D5W) FLUSH BAG - 250 ML
INTRAVENOUS | Status: DC | PRN
Start: 2023-01-21 — End: 2023-01-22

## 2023-01-21 MED ORDER — DIPHENHYDRAMINE 50 MG/ML INJECTION SOLUTION
25.0000 mg | Freq: Once | INTRAMUSCULAR | Status: DC | PRN
Start: 2023-01-21 — End: 2023-01-22

## 2023-01-21 MED ORDER — VALSARTAN 40 MG TABLET
80.0000 mg | ORAL_TABLET | Freq: Two times a day (BID) | ORAL | 1 refills | Status: DC
Start: 2023-01-21 — End: 2023-07-09

## 2023-01-21 MED ORDER — ONDANSETRON HCL 8 MG TABLET
8.0000 mg | ORAL_TABLET | Freq: Once | ORAL | Status: DC | PRN
Start: 2023-01-21 — End: 2023-01-22

## 2023-01-21 MED ORDER — DIPHENHYDRAMINE 50 MG/ML INJECTION SOLUTION
50.0000 mg | Freq: Once | INTRAMUSCULAR | Status: DC | PRN
Start: 2023-01-21 — End: 2023-01-22

## 2023-01-21 MED ORDER — MEPERIDINE (PF) 25 MG/ML INJECTION SOLUTION
12.5000 mg | Freq: Once | INTRAMUSCULAR | Status: DC | PRN
Start: 2023-01-21 — End: 2023-01-22

## 2023-01-21 MED ORDER — ONDANSETRON HCL (PF) 4 MG/2 ML INJECTION SOLUTION
8.0000 mg | Freq: Once | INTRAMUSCULAR | Status: DC | PRN
Start: 2023-01-21 — End: 2023-01-22

## 2023-01-21 MED ORDER — SODIUM CHLORIDE 0.9% FLUSH BAG - 250 ML
INTRAVENOUS | Status: DC | PRN
Start: 2023-01-21 — End: 2023-01-22

## 2023-01-21 MED ORDER — SODIUM CHLORIDE 0.9 % INTRAVENOUS SOLUTION
200.0000 mg | Freq: Once | INTRAVENOUS | Status: AC
Start: 2023-01-21 — End: 2023-01-21
  Administered 2023-01-21: 200 mg via INTRAVENOUS
  Administered 2023-01-21: 0 mg via INTRAVENOUS
  Filled 2023-01-21: qty 8

## 2023-01-21 NOTE — Cancer Center Note (Signed)
Department of Hematology/Oncology      Name: Taylor Smith  ZOX:[id number]  Date of Birth: 1970/08/04  Encounter Date: 01/21/2023    REFERRING PROVIDER:  Macario Golds, FNP-C  [address]  [place],  [place] [address]    REASON FOR OFFICE VISIT:  management of stage IV clear cell renal carcinoma    HISTORY OF PRESENT ILLNESS:Taylor  Taylor Smith is Taylor 52 y.o. 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      ONCOLOGY HISTORY:  She was originally diagnosed after having Taylor biopsy in the genitourinary area.  It came back consistent with clear cell renal cell carcinoma, which prompted an imaging study of the chest, abdomen, and pelvis.  She was found to have Taylor massive left kidney mass with numerous small pulmonary nodules.  The renal mass appears to be involving the renal vein but not the inferior vena cava. Prior to this, she was not having any symptoms.  Specifically, she did not have any hematuria or history of iron-deficiency anemia.  She does report back pain which has been going on for the past year but it is unclear whether that was related or not.  She has had Taylor Left cytoreductive Nephrectomy in 5/24 with clear margins and 5/5 negative for involvement. he also had no evidence of any lymphovascular invasion at the time.     12/10/2022: The patient is here for follow up of metastatic renal cell carcinoma.  She was seen at [facility] and has been told that she is essentially cancer-free at this time and no additional therapy was advised.    12/31/22:  Patient is here for follow up of metastatic renal cell carcinoma.  She is due for [facility] today.   She reports that she has shortness of breath mostly with exertion but sometimes without exertion.   She reports that otherwise she is feeling well.  She denies any issues with diarrhea, increased cough, or abdominal pain.    01/21/2023:  Patient is here for follow up on Metastatic renal cell Carcinoma.  She is due  for [facility] today.    She denies any new issues or complaints.  She reports that she is doing well.       ROS:   Review of Systems   Constitutional:  Positive for fatigue. Negative for appetite change and chills.   HENT:   Negative for sore throat and trouble swallowing.    Eyes:  Negative for eye problems.   Respiratory:  Positive for shortness of breath. Negative for cough.    Cardiovascular:  Negative for chest pain and leg swelling.   Gastrointestinal:  Negative for abdominal pain, constipation, diarrhea, nausea and vomiting.   Genitourinary:  Negative for difficulty urinating, dyspareunia, dysuria and hematuria.    Musculoskeletal:  Positive for arthralgias (knees mostly). Negative for gait problem.   Skin:  Negative for rash.   Neurological:  Negative for gait problem.   Hematological:  Negative for adenopathy.   Psychiatric/Behavioral:  Negative for depression.         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)     Kidney cancer, primary, with metastasis from kidney to other site, left (CMS [facility])          Past Surgical  History:   Procedure Laterality Date    HX CESAREAN SECTION      HX CHOLECYSTECTOMY      HX CYST REMOVAL Right     HX HYSTERECTOMY      NEPHROURETERECTOMY Left     TOTAL THYROIDECTOMY           Social History     Socioeconomic History    Marital status: Married     Spouse name: Not on file    Number of children: Not on file    Years of education: Not on file    Highest education level: Not on file   Occupational History    Not on file   Tobacco Use    Smoking status: Never    Smokeless tobacco: Never   Vaping Use    Vaping status: Never Used   Substance and Sexual Activity    Alcohol use: Not on file    Drug use: Never    Sexual activity: Yes   Other Topics Concern    Not on file   Social History Narrative    Not on file      Social Determinants of Health     Financial Resource Strain: Not on file   Transportation Needs: No Transportation Needs (10/10/2022)    Received from [facility], [facility]    PRAPARE - Transportation     In the past 12 months, has lack of transportation kept you from medical appointments or from getting medications?: No     Lack of Transportation (Non-Medical): No   Social Connections: Not on file   Intimate Partner Violence: High Risk (01/13/2022)    Intimate Partner Violence     SDOH Domestic Violence: No   Housing Stability: Not on file     Family Medical History:       Problem Relation (Age of Onset)    Cancer Maternal Aunt, Maternal Grandmother    Colon Cancer Maternal Uncle    Lung Cancer Mother    Melanoma Maternal Aunt            Current Outpatient Medications   Medication Sig    ergocalciferol, vitamin D2, (DRISDOL) 1,250 mcg (50,000 unit) Oral Capsule Take 1 Capsule (50,000 Units total) by mouth Every 7 days    hydroCHLOROthiazide (MICROZIDE) 12.5 mg Oral Capsule TAKE 1 CAPSULE (12.5 MG TOTAL) BY MOUTH ONCE Taylor DAY INDICATIONS: HIGH BLOOD PRESSURE    Ibuprofen (MOTRIN) 600 mg Oral Tablet Take 1 Tablet (600 mg total) by mouth Every 6 hours as needed    levothyroxine (SYNTHROID) 137 mcg Oral Tablet Take 1 Tablet (137 mcg total) by mouth Every morning    omeprazole (PRILOSEC) 40 mg Oral Capsule, Delayed Release(E.C.) Take 1 Capsule (40 mg total) by mouth Once Taylor day    pembrolizumab (KEYTRUDA) 25 mg/mL Intravenous Solution Infuse 8 mL (200 mg total) into Taylor venous catheter Every 21 days    valsartan (DIOVAN) 160 mg Oral Tablet Take 1 Tablet (160 mg total) by mouth Once Taylor day     Allergies   Allergen Reactions    Aldactone [Spironolactone] Itching       PHYSICAL EXAM:  BP (!) 136/93 (Site: Right Arm, Patient Position: Sitting, Cuff Size: Adult)   Pulse 69   Temp 37.1 C (98.7 F) (Temporal)   Ht 1.753 m (5\' 9" )   Wt 98.3 kg (216 lb 12.8 oz)   SpO2 97%   BMI 32.02  kg/m  ECOG Status: (0) Fully active, able to carry on all predisease performance without restriction   Physical Exam  Vitals reviewed.   Constitutional:       Appearance: Normal appearance. She is normal weight.   Cardiovascular:      Rate and Rhythm: Normal rate and regular rhythm.      Pulses: Normal pulses.      Heart sounds: Normal heart sounds.   Pulmonary:      Effort: Pulmonary effort is normal.      Breath sounds: Normal breath sounds.   Abdominal:      General: Bowel sounds are normal.      Palpations: Abdomen is soft.   Lymphadenopathy:      Head:      Right side of head: No submental, submandibular, tonsillar, preauricular, posterior auricular or occipital adenopathy.      Left side of head: No submental, submandibular, tonsillar, preauricular, posterior auricular or occipital adenopathy.      Cervical: No cervical adenopathy.      Right cervical: No superficial, deep or posterior cervical adenopathy.     Left cervical: No superficial, deep or posterior cervical adenopathy.      Upper Body:      Right upper body: No supraclavicular or axillary adenopathy.      Left upper body: No supraclavicular or axillary adenopathy.   Skin:     General: Skin is warm and dry.      Capillary Refill: Capillary refill takes less than 2 seconds.   Neurological:      Mental Status: She is alert and oriented to person, place, and time. Mental status is at baseline.         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     Technique:  CT imaging of the chest, abdomen, and pelvis was performed   following the administration of IV contrast. Imaging was performed through   the kidneys before the administration of contrast, followed by arterial   phase imaging through the liver, and portal venous phase imaging of the   chest, abdomen, and pelvis.   Iodinated contrast was used due to the   indications for the examination, to improve disease detection and to   further define anatomy.   Coronal and sagittal reformatted images of the   abdomen and pelvis were generated and reviewed. 3-D maximum intensity   projection (MIP) reconstructions of the chest were performed to potentially   increase study sensitivity.     Findings:   Chest:   - Chest wall and Thoracic Inlet: No masses or lymphadenopathy.   Thyroidectomy changes.     - Mediastinum and Hila: No masses or lymphadenopathy.     - Thoracic Vessels: Normal caliber of the thoracic aorta and main pulmonary   artery. Mild atherosclerotic plaque.     - Heart and Pericardium: Normal heart size.  No pericardial effusion.     -  Lungs and Airways: Small poorly nodules are unchanged from prior. For   example measuring 5 mm in the right lower lobe along the fissure on series   7 image 276. No new suspicious or enlarging nodule.     - Pleura: No pleural effusions.       Abdomen and pelvis:     - Liver: Normal in morphology and enhancement.  No suspicious hepatic   masses are identified.  The portal and hepatic veins are patent.     - Biliary and Gallbladder: No intrahepatic or extrahepatic bile duct   dilatation. The gallbladder is surgically absent.     - Spleen: Normal in appearance.      - Pancreas: Normal in appearance.     - Adrenal Glands: [Colectomy.     - Kidneys: Postsurgical changes from interval total left nephrectomy. No   suspicious soft tissue nodularity or enhancement along the operative site.   No new  suspicious renal lesions. No hydronephrosis.     - Abdominal and Pelvic Vasculature: No abdominal aortic aneurysm. Improved   collateral vessels in the left upper quadrant.     - Gastrointestinal Tract: No abnormal dilation or wall thickening.     - Peritoneum/Mesentery/Retroperitoneum: No free fluid.  No free   intraperitoneal air.     - Lymph Nodes: No retroperitoneal or mesenteric lymphadenopathy.      - Bladder: Normal in appearance.     - Pelvic Organs: Unremarkable.     - Body Wall: Healed midline laparotomy incision.     - Musculoskeletal:  Unchanged partially sclerotic T1 and T3 vertebral body   lesions. No new suspicious osseous lesion.       Impression:   1. Interval post procedure changes from total left nephrectomy. There is   no evidence of local residual disease.   2.  No new evidence of metastasis in the chest, abdomen, or pelvis.       Electronically Reviewed by:  Manus Rudd, MD, [facility]   Electronically Reviewed on:  12/02/2022 1:26 PM     I have reviewed the images and concur with the above findings.     Electronically Signed by:  Morton Stall, MD, [facility]   Electronically Signed on:  12/02/2022 2:33 PM     LABS:  CBC  Diff   Lab Results   Component Value Date/Time    WBC 3.7 (L) 01/21/2023 07:53 AM    HGB 14.2 01/21/2023 07:53 AM    HCT 41.4 01/21/2023 07:53 AM    PLTCNT 144 01/21/2023 07:53 AM    RBC 4.68 01/21/2023 07:53 AM    MCV 88.4 01/21/2023 07:53 AM    MCHC 34.4 01/21/2023 07:53 AM    MCH 30.4 01/21/2023 07:53 AM    RDW 14.0 01/21/2023 07:53 AM    MPV 10.2 01/21/2023 07:53 AM    Lab Results   Component Value Date/Time    PMNS 58 01/21/2023 07:53 AM    LYMPHOCYTES 30 01/21/2023 07:53 AM    EOSINOPHIL 5 01/21/2023 07:53 AM    MONOCYTES 7 01/21/2023 07:53 AM    BASOPHILS 1 01/21/2023 07:53 AM    BASOPHILS 0.00 01/21/2023 07:53 AM    PMNABS 2.20 01/21/2023 07:53 AM    LYMPHSABS 1.10 01/21/2023 07:53 AM    EOSABS 0.20 01/21/2023 07:53 AM    MONOSABS 0.20 (L) 01/21/2023 07:53 AM             Comprehensive Metabolic Profile    Lab Results  Component Value Date    SODIUM 137 12/31/2022    POTASSIUM 3.6 12/31/2022    CHLORIDE 109 (H) 12/31/2022    CO2 32 (H) 12/31/2022    ANIONGAP 9 12/10/2022    BUN 10 12/31/2022    CREATININE 1.05 12/31/2022    ALBUMIN 4.3 12/31/2022    CALCIUM 9.5 12/31/2022    GLUCOSENF 112 (H) 12/31/2022    ALKPHOS 65 12/31/2022    ALT 37 12/31/2022    AST 33 12/31/2022    TOTBILIRUBIN 1.0 12/31/2022    TOTALPROTEIN 7.3 12/31/2022         ESTIMATED GFR   Date Value Ref Range Status   12/31/2022 64 >59 mL/min/1.32m^2 Final       SIGNATERA TEST RESULT   Date Value Ref Range Status   12/10/2022 NEGATIVE  Final     SIGNATERA MTM READOUT   Date Value Ref Range Status   12/10/2022 0 MTM/ml Final     Comment:     Limitations  Signatera is Taylor personalized, tumor-informed test for the longitudinal detection of circulating tumor DNA (ctDNA). Interval testing is recommended for all patients. Studies have demonstrated that when ctDNA is detected (Signatera Positive) following surgery or definitive treatment, the risk for disease relapse is high without further treatment. Conversely, when ctDNA is not detected, the patient may be considered at lower risk for relapse. For those with multiple timepoints, upward trending ctDNA levels are suggestive of increasing tumor burden (1,2). For Taylor single time point in isolation, the absolute MTM/mL value has no known clinical significance and should not be compared across patients. Test results should be interpreted in context of other clinicopathological features. ctDNA detection sensitivity may be limited due to blood collection within two weeks of surgery and while the patient is on therapy. Signatera is Taylor quantitative test and reports in units of mean tumor molecules per   ml (MTM/mL), which is comprised of three measured components (plasma volume, cell free DNA (cfDNA) concentration, and Variant Allele Frequency (VAF)). The MTM/mL number will  be qualified if any measured component falls outside the analytical measurement range for that component. The analytical sensitivity is 95% at the limit of detection (0.3 MTM/mL). Results obtained are specific to the assessed time point. Taylor negative test result does not definitively indicate the absence of cancer. This test is not designed to detect or report germline variation, nor does it infer hereditary cancer risk for the patient. Each Signatera assay is designed to Taylor single tumor for Taylor given patient. At this time, multiple personalized Signatera assays cannot be developed for the same patient. This test is designed to detect ctDNA from the assayed tumor only; new primary tumors will not be detected. There is Taylor low risk that Taylor new primary may share Taylor variant that could interfere with the Signatera test.   Testing cannot be performed in patients who are pregnant, have Taylor history of bone marrow transplant, or history of blood transfusion within three months. This test is expected to have limited sensitivity in cancer types such as GIST, renal cell carcinomas, primary brain tumors, and lymphoma due to limited ctDNA shed. 1 Bratman SV, [place] Senate, Iafolla MAJ, et al. Personalized circulating tumor DNA analysis as Taylor predictive biomarker in solid tumor patients treated with pembrolizumab. Nature Cancer. 2020;1(9):873-881. 2 Henriksen TV, Veronia Beets, et al., Circulating Tumor DNA in Stage III Colorectal Cancer, beyond Minimal Residual Disease Detection, toward Assessment of Adjuvant Therapy Efficacy and Clinical Behavior of Recurrences. Clin Cancer Res.  2021; 28(3):507-517.  Methodology  FFPE samples are assessed by Taylor pathologist to identify tumor margins and percent tumor content. Tumor DNA is extracted using Qiagen AllPrep. Whole genomic DNA is isolated from peripheral blood using QIAamp DNA Blood Mini Kit to provide Taylor baseline DNA sequence. Circulating tumor DNA (ctDNA) is extracted from plasma derived from whole blood  samples collected in cell-free DNA blood tubes (Streck) using the QIAsymphony automated or manual extraction method (Qiagen). Using Taylor proprietary algorithm, putative, clonal variants present in the tumor but absent in the germline DNA are identified to design the customized multiplex PCR assay. Whole-exome sequencing is performed on tumor and peripheral blood DNA using KAPA HyperPrep Warden/ranger (Roche) with Taylor custom xGen exome capture (IDT). Using Taylor proprietary algorithm, putative clonal variants present in the tumor but absent in the germline DNA are identified to design the customized multiplex PCR assay. Circulating tumor DNA is extracted from   plasma collected in Streck tubes using [employer] proprietary methods. The customized PCR assays are run to detect presence or absence of these variants within circulating plasma. Taylor patient's plasma sample is considered ctDNA positive when at least two individual-specific tumor variants are detected. When fewer than two individual-specific tumor variants are observed, Taylor negative result is issued. Tumor variation outside of the individual, tumor specific variants is not assessed. Pathology services and whole exome sequencing are performed at [facility] (CLIA ID# [id number]) [address], [place], [place] [address].  Disclaimer  The extraction, Designer, television/film set, and sequencing for this test were performed by [facility] [address], [place], [place] [address] (CLIA [place] [id number]). The data analysis and reporting for this test were performed by [facility] [address], [place], [place] [address] (CLIA [place] [id number]). This test was developed and its performance characteristics determined by [facility]. The test has not been cleared or approved by the U.S. Food and Drug Administration (FDA). CAP accredited, ISO 13485 certified, and CLIA certified. Pathology services and whole exome sequencing for this test were performed by [facility], [address] [place] [address], CLIA # [id number].  2021 [employer] All Rights Reserved.         ASSESSMENT:    ICD-10-CM    1. Kidney cancer, primary, with metastasis from kidney to other site, left (CMS [facility])  C64.2 CT CHEST ABDOMEN PELVIS W IV CONTRAST      2. Encounter for immunotherapy  Z29.89                PLAN:   1. All relevant medical records were reviewed including available pertinent provider notes, procedure notes, imaging, laboratory, and pathology.   2. All pertinent labs and/or imaging were reviewed with the patient.   3. Stage IV renal carcinoma:  She has had Taylor great response to treatment, but has known pulmonary metastases which have not been definitively addressed.   Signatera was negative on 12/10/22.   Dr. Damita Lack believes that at Taylor minimum, it would be reasonable to utilize pembrolizumab in Taylor pseudo adjuvant fashion for 12 months.  She would also meet criteria for treatment of metastatic disease, for which the medication would be used for 2 years.  We reviewed the information from the [facility] clinical trials in regard to survival.   Keytruda was restarted after discussion with Dr. Damita Lack.   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  5.  Pain:  Improved.      CHINENYE DONINI was given the chance to ask questions, and these were answered to their satisfaction. The patient is welcome to call with any questions or concerns in the meantime.     On the day of the encounter, Taylor total of 30 minutes was spent on this patient encounter including review of historical information, examination, documentation and post-visit activities.   Return in about 6 weeks (around 03/04/2023).     Marvene Staff APRN, FNP-BC, AOCNP, 01/21/2023 , 08:28   You can see your note(s) in [facility]. It is common for you to encounter certain medical terminology which may be unfamiliar to you. You might see results before your provider does so please give at least 2  business days for review. Please have this understanding, that NOT all abnormal results are significant. Our office will contact you for any urgent or emergent action if necessary. If you have any questions or concerns, feel free to send Taylor MyChart message or call the office. Please call with any new or concerning symptoms.     The patient's insurance company bears full legal and financial responsibility resulting from any deviations that they cause to my recommended treatment plan.   CC:  Macario Golds, FNP-C  [address]  [place] [address]    Macario Golds, FNP-C  [address]  [place],  [place] [address]    This note was partially generated using MModal Fluency Direct system, and there may be some incorrect words, spellings, and punctuation that were not noted in checking the note before saving.

## 2023-01-21 NOTE — Nurses Notes (Addendum)
0857-Patient to room ambulatory.  Patient reports feeling well and suffering from no symptoms related to treatment.  Patient is oriented x4, lung sounds are clear, and strong palpable pulses.  Vitals are stable and blood work baseline.Salli Quarry, RN    Patient Assessment/Symptom Management Patient Has MD Appointment Today   Key: (+) Symptom present           (-)  Symptom not present If Symptom is Positive(+) a Nursing Note is required   Edema -   Uncontrolled Nausea -   Vomiting -   Inability to eat/drink -   Mouth Sores -   Diarrhea -   Constipation (? Last BM) -   Fatigue that interferes with ADL's -   Numbness/Tingling -change -   Other -   Fever/Signs & Symptoms of infection -   Nurse Initials KB       0930-Peripheral IV placed and blood return obtained.Salli Quarry, RN  1015-KEYTRUDA infusion started.Salli Quarry, RN  1045-KEYTRUDA infusion completed.Salli Quarry, RN  1115-Peripheral IV removed with catheter intact.  Blood return obtained prior to Janeece Riggers, RN  1118-Patient checking out at this time.  Vitals stable.Salli Quarry, RN

## 2023-01-27 IMAGING — MG 3D SCREENING MAMMO BIL AND TOMO
3 series · 8 of 24 positions shown · non-contrast
Comparison: 09/04/2023, 08/08/2021, 08/02/2020.

------------- REPORT [id number] -------------
﻿

EXAM:  3D SCREENING MAMMO BIL AND TOMO
INDICATION: Screening mammogram.  Asymptomatic 52-year-old with family history in her mother.  Lifetime breast cancer risk 17.2%.

[R CC tomo · right · 0.10mm/px · 4 of 4 slices shown]
[im 1/4]
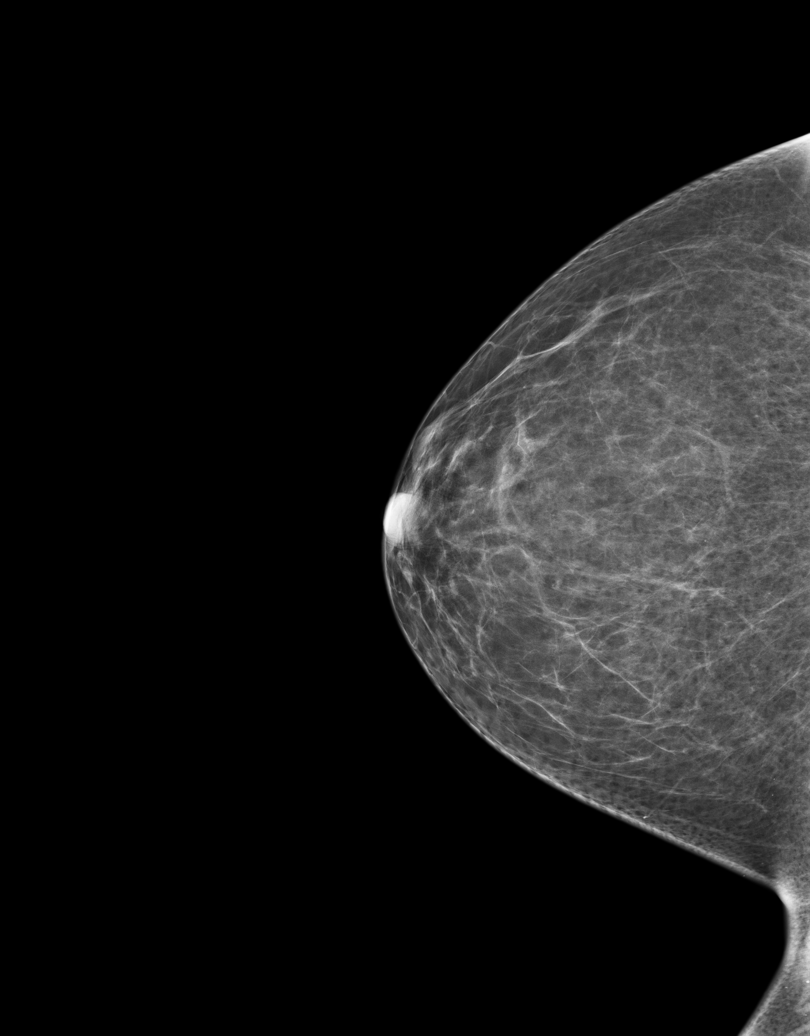
[im 2/4]
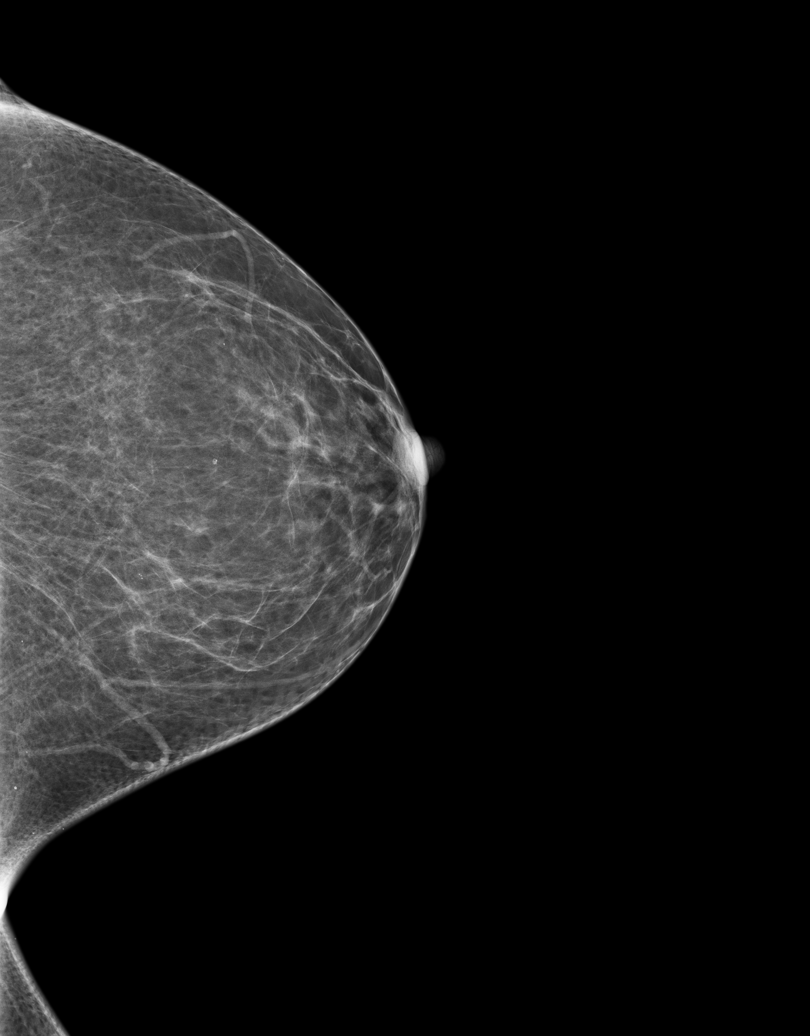
[im 3/4]
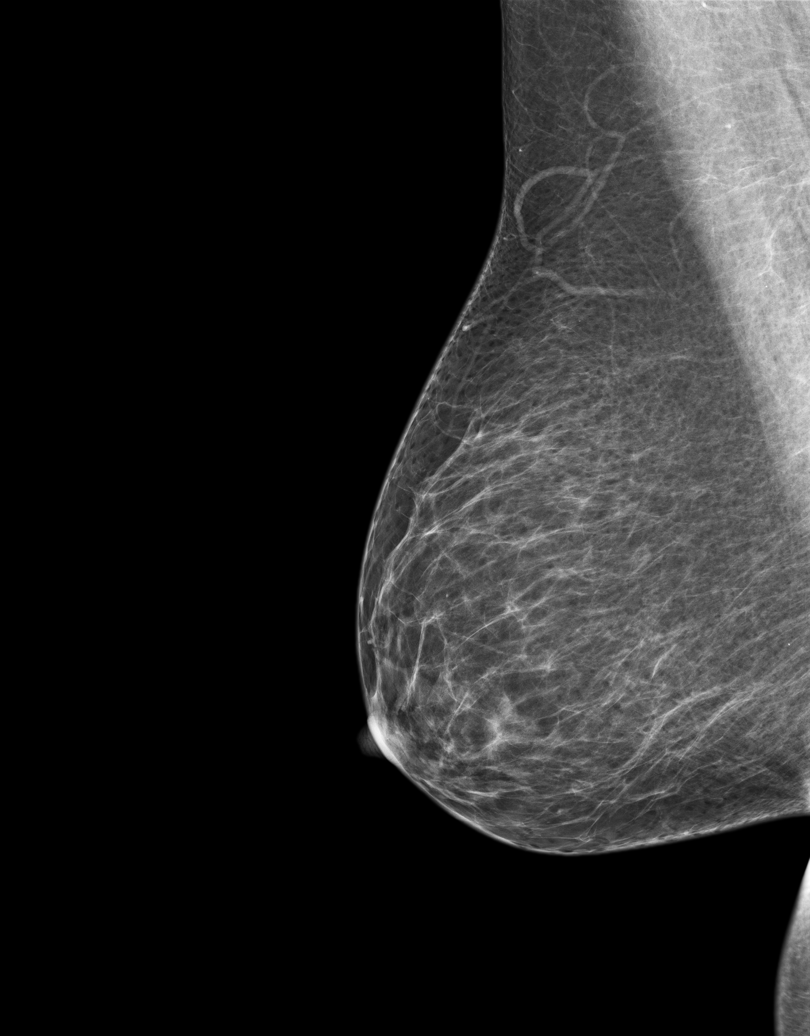
[im 4/4]
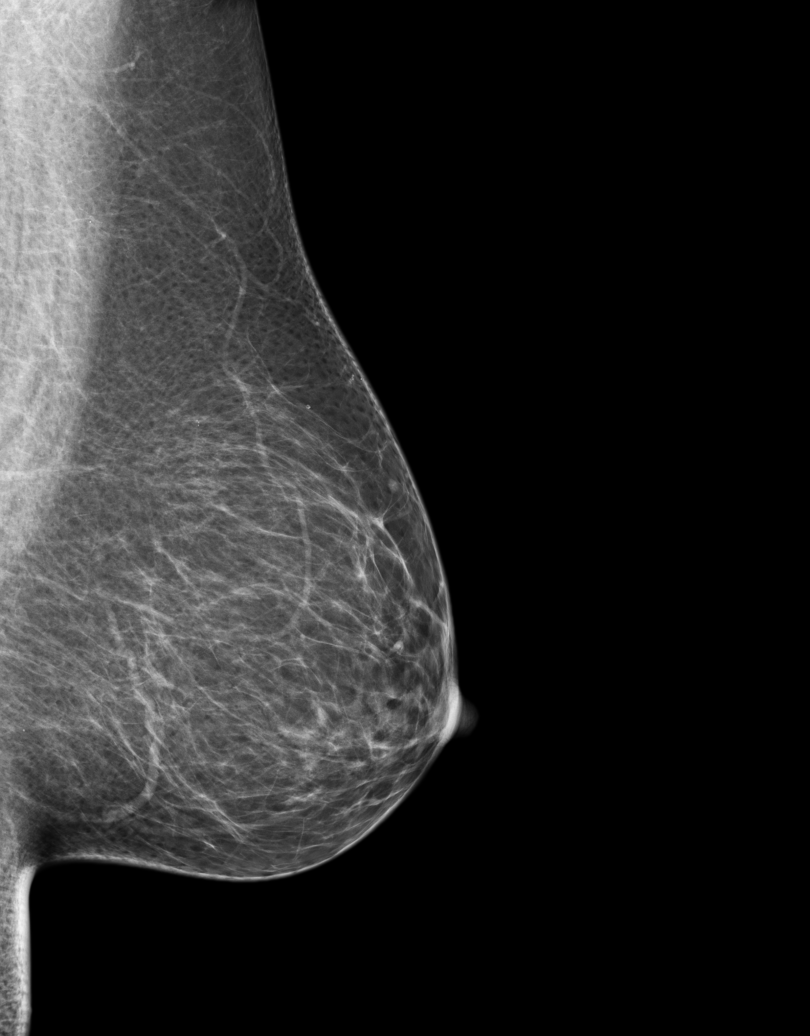

[3D SCREENING MAMMO BIL AND TOMO tomo · 2 acquisitions, 3 frames shown (1 of 2)]
[im 1/2]
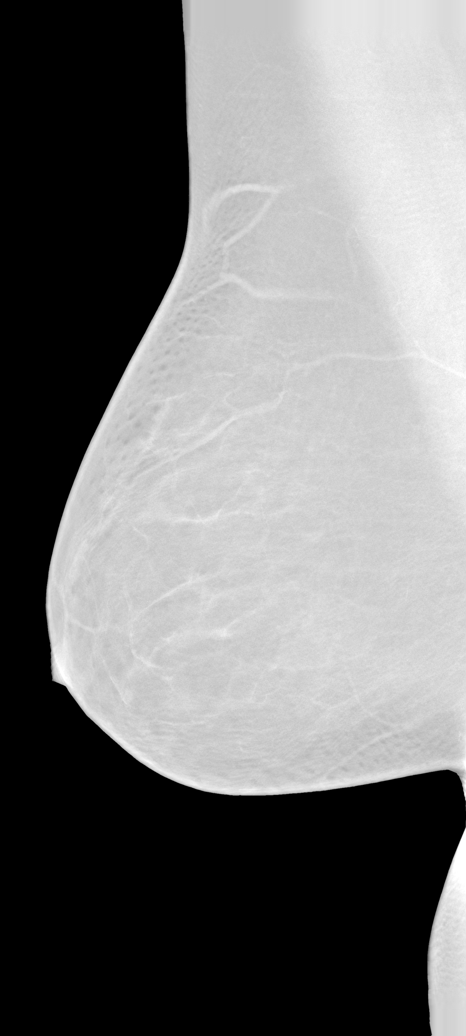
[im 2/2]
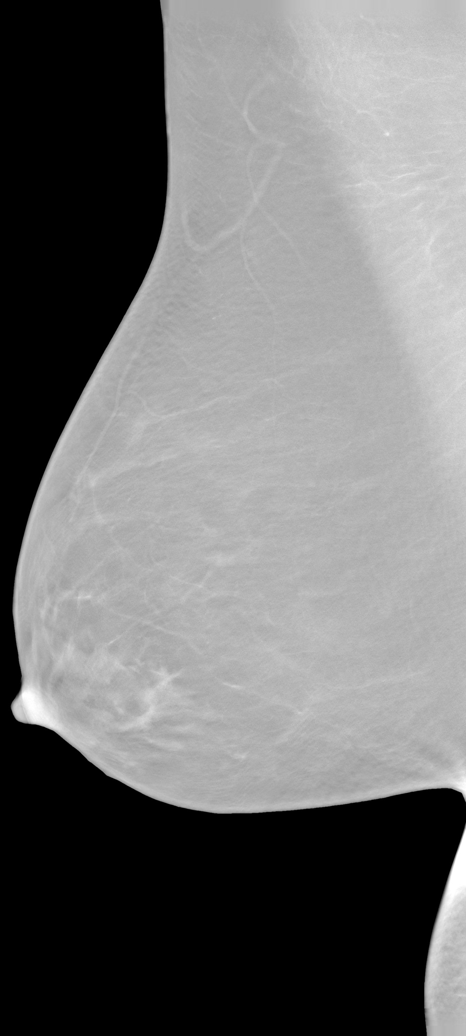
[im 2/2]
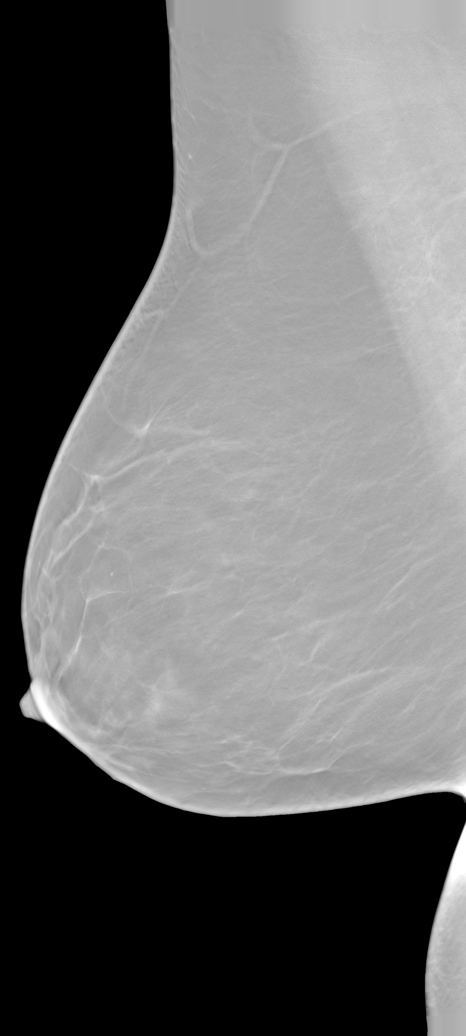

[3D SCREENING MAMMO BIL AND TOMO tomo (2 of 2) · tomo slice 11/68.0]
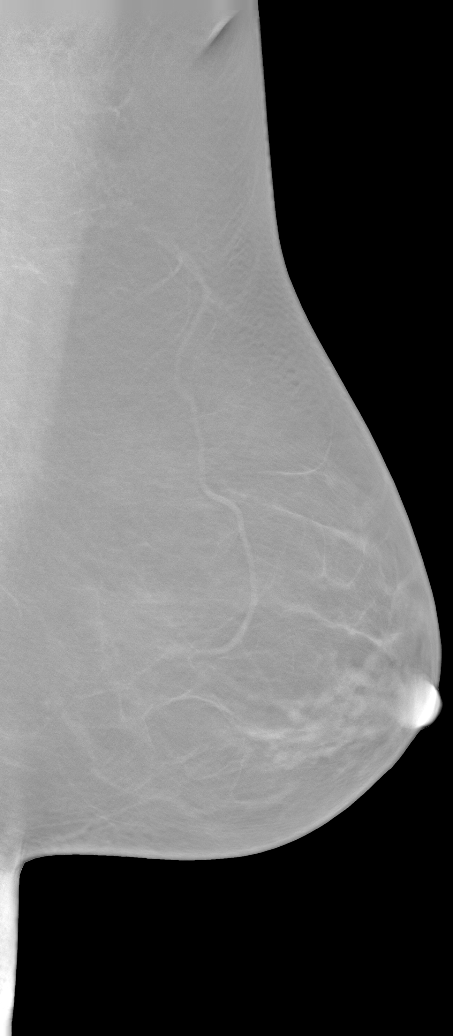

[8 of 24 positions shown; findings below may reference images not displayed]

FINDINGS: There are scattered fibroglandular elements.  There is no mass or suspicious cluster of microcalcifications.   There is no architectural distortion, skin thickening or nipple retraction.
IMPRESSION: 1.  BIRADS 2-Benign findings. Patient has been added in a reminder system with a target date for the next screening mammography.

2.  DENSITY CODE –  B (Scattered areas of fibroglandular density) 

Final Assessment Code:

BI-RADS 0
 Need additional imaging evaluation.

BI-RADS 1
 Negative mammogram.

BI-RADS 2
 Benign finding.

BI-RADS 3
 Probably benign finding; short-interval follow-up suggested.

BI-RADS 4
 Suspicious abnormality; biopsy should be considered.

BI-RADS 5
 Highly suggestive of malignancy; appropriate action should be taken.

BI-RADS 6
 Known biopsy-proven malignancy; appropriate action should be taken.

NOTE:
In compliance with Federal regulations, the results of this mammogram are being sent to the patient.

------------- REPORT [id number] -------------
[facility]
[address]
[place]/MS/BILLIOT, LUCYNA
We wish to report the following on your recent mammography examination. We are sending a report to your referring physician or other health care provider.
FINDING: Normal-no evidence of cancer

This statement is mandated by the [place], Department of Health.
Your examination was performed by one of our technologists, who are registered radiological technologists and also specially certified in mammography:
___
Marzan, Heron (M)

Your mammogram was interpreted by our radiologist.

( 
Apple Martha, M.D.

(Annual Breast Examination by a physician or other health care provider
(Annual Mammography Screening beginning at age 40
(Monthly Breast Self Examination

## 2023-02-01 ENCOUNTER — Encounter (INDEPENDENT_AMBULATORY_CARE_PROVIDER_SITE_OTHER): Payer: Self-pay | Admitting: HEMATOLOGY-ONCOLOGY

## 2023-02-02 ENCOUNTER — Ambulatory Visit (INDEPENDENT_AMBULATORY_CARE_PROVIDER_SITE_OTHER): Payer: 59 | Admitting: NURSE PRACTITIONER

## 2023-02-02 ENCOUNTER — Encounter (INDEPENDENT_AMBULATORY_CARE_PROVIDER_SITE_OTHER): Payer: Self-pay | Admitting: NURSE PRACTITIONER

## 2023-02-02 ENCOUNTER — Other Ambulatory Visit: Payer: Self-pay

## 2023-02-02 ENCOUNTER — Other Ambulatory Visit: Payer: 59 | Attending: NURSE PRACTITIONER | Admitting: NURSE PRACTITIONER

## 2023-02-02 VITALS — BP 143/82 | HR 80 | Temp 98.9°F | Resp 18 | Ht 69.02 in | Wt 216.0 lb

## 2023-02-02 DIAGNOSIS — R6883 Chills (without fever): Secondary | ICD-10-CM | POA: Insufficient documentation

## 2023-02-02 DIAGNOSIS — R52 Pain, unspecified: Secondary | ICD-10-CM | POA: Insufficient documentation

## 2023-02-02 DIAGNOSIS — Z20818 Contact with and (suspected) exposure to other bacterial communicable diseases: Secondary | ICD-10-CM

## 2023-02-02 DIAGNOSIS — J029 Acute pharyngitis, unspecified: Secondary | ICD-10-CM

## 2023-02-02 LAB — COVID-19 ~~LOC~~ MOLECULAR LAB TESTING
INFLUENZA VIRUS TYPE A: NOT DETECTED
INFLUENZA VIRUS TYPE B: NOT DETECTED
RESPIRATORY SYNCTIAL VIRUS (RSV): NOT DETECTED
SARS-CoV-2: NOT DETECTED

## 2023-02-02 LAB — RAPID THROAT SCREEN, STREPTOCOCCUS, WITH REFLEX: THROAT RAPID SCREEN, STREPTOCOCCUS: NEGATIVE

## 2023-02-02 MED ORDER — LIDOCAINE HCL 10 MG/ML (1 %) INJECTION SOLUTION
1.0000 g | INTRAMUSCULAR | Status: AC
Start: 2023-02-02 — End: 2023-02-02
  Administered 2023-02-02: 1 g via INTRAMUSCULAR

## 2023-02-02 NOTE — Progress Notes (Signed)
[facility], [facility]  [address]  [place] [address]      Acute Office Visit      NAME: Taylor Smith DOS: 02/02/2023   MRN: [id number] DOB: Feb 15, 1971     Chief Complaint: Sore Throat (Symptoms started last night. Pt was exposed to strep.), Generalized Body Aches, and Chills      History of Present Illness: Taylor Smith is a 52 y.o. female who comes in today with complaints of fever, generalized body aches, painful swallowing, sore throat and funning a fever of around 100 at home. She has been keeping her grand daughter who has strep, she is also currently being treated with [place] and is immunocompromised.   She denies any chest pain/pressure, shortness of breath, nausea, vomiting, diarrhea, or abd pain.        No other acute concerns or complaints at this time.    Medical History     I have reviewed and updated as appropriate the past medical, surgical, family, and social history today:  Medical History/Surgical History/Family History/Social History    Allergies:  Allergies   Allergen Reactions    Aldactone [Spironolactone] Itching       Medications:  Current Outpatient Medications   Medication Sig    ergocalciferol, vitamin D2, (DRISDOL) 1,250 mcg (50,000 unit) Oral Capsule Take 1 Capsule (50,000 Units total) by mouth Every 7 days    hydroCHLOROthiazide (MICROZIDE) 12.5 mg Oral Capsule TAKE 1 CAPSULE (12.5 MG TOTAL) BY MOUTH ONCE A DAY INDICATIONS: HIGH BLOOD PRESSURE    Ibuprofen (MOTRIN) 600 mg Oral Tablet Take 1 Tablet (600 mg total) by mouth Every 6 hours as needed    levothyroxine (SYNTHROID) 137 mcg Oral Tablet TAKE 1 TABLET BY MOUTH EVERY MORNING.    omeprazole (PRILOSEC) 40 mg Oral Capsule, Delayed Release(E.C.) Take 1 Capsule (40 mg total) by mouth Once a day    pembrolizumab (KEYTRUDA) 25 mg/mL Intravenous Solution Infuse 8 mL (200 mg total) into a venous catheter Every 21 days    valsartan (DIOVAN) 160 mg Oral Tablet Take 1 Tablet (160 mg total) by mouth Once  a day    valsartan (DIOVAN) 40 mg Oral Tablet Take 2 Tablets (80 mg total) by mouth Twice daily       Problem List:  Patient Active Problem List    Diagnosis Date Noted    Kidney cancer, primary, with metastasis from kidney to other site, left (CMS HCC) 01/13/2022    S/P hysterectomy 01/06/2022    Vitamin D deficiency 11/07/2021    Hypothyroid 11/07/2021    GERD (gastroesophageal reflux disease) 11/07/2021    Primary hypertension 11/07/2021    Neural foraminal stenosis of cervical spine 11/07/2021    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   Constitutional:       General: She is not in acute distress.     Appearance: Normal appearance. She is ill-appearing. She is not diaphoretic.   HENT:      Head: Normocephalic and atraumatic.      Mouth/Throat:  Lips: Pink.      Pharynx: Pharyngeal swelling, oropharyngeal exudate and posterior oropharyngeal erythema present.      Tonsils: Tonsillar exudate present. 2+ on the right. 2+ on the left.   Eyes:      Pupils: Pupils are equal, round, and reactive to light.   Cardiovascular:      Rate and Rhythm: Normal rate.      Pulses: Normal pulses.      Heart sounds: Normal heart sounds, S1 normal and S2 normal.   Pulmonary:      Effort: Pulmonary effort is normal.      Breath sounds: Normal breath sounds.   Abdominal:      General: Abdomen is flat. Bowel sounds are normal.      Palpations: Abdomen is soft.   Musculoskeletal:      Cervical back: Normal range of motion.   Skin:     General: Skin is warm and dry.      Capillary Refill: Capillary refill takes less than 2 seconds.   Neurological:      General: No focal deficit present.      Mental Status: She is alert and oriented to  person, place, and time.            Assessment/Plan     Diagnosis/Plan:  Problem List Items Addressed This Visit    None  Visit Diagnoses       Exposure to strep throat    -  Primary    Relevant Orders    COVID-19 SCREENING - SYMPTOMATIC (OP)    RAPID THROAT SCREEN, STREPTOCOCCUS, WITH REFLEX    Chills (without fever)        Relevant Orders    COVID-19 SCREENING - SYMPTOMATIC (OP)    RAPID THROAT SCREEN, STREPTOCOCCUS, WITH REFLEX    Generalized body aches        Relevant Orders    COVID-19 SCREENING - SYMPTOMATIC (OP)    Pharyngitis, unspecified etiology        Relevant Medications    cefTRIAXone (ROCEPHIN) 1 g in lidocaine 2.86 mL (tot vol) IM injection (Start on 02/02/2023  3:30 PM)           Strep and viral panel performed, due to exudate on the tonsils, exposure and symptoms, patient treated for strep. Instructed to complete therapy as prescribed. May continue to take tylenol as needed and use salt water gargles as needed.   If symptoms worsen/persist or do not improve, come back in for further evaluation.     Encounter Medications and Orders  Orders Placed This Encounter    RAPID THROAT SCREEN, STREPTOCOCCUS, WITH REFLEX    COVID-19 SCREENING - SYMPTOMATIC (OP)    cefTRIAXone (ROCEPHIN) 1 g in lidocaine 2.86 mL (tot vol) IM injection       Return if symptoms worsen or fail to improve.      Macario Golds, FNP-C  02/02/2023 15:04     This note was partially created using M*Modal fluency direct system (voice recognition software ) and is inherently subject to errors including those of syntax and "sound- alike" substitutions which may escape proofreading.  In such instances, original meaning may be extrapolated by contextual derivation.Marland Kitchen

## 2023-02-03 ENCOUNTER — Other Ambulatory Visit (INDEPENDENT_AMBULATORY_CARE_PROVIDER_SITE_OTHER): Payer: Self-pay | Admitting: NURSE PRACTITIONER

## 2023-02-03 ENCOUNTER — Encounter (INDEPENDENT_AMBULATORY_CARE_PROVIDER_SITE_OTHER): Payer: Self-pay | Admitting: NURSE PRACTITIONER

## 2023-02-03 MED ORDER — AZITHROMYCIN 250 MG TABLET
ORAL_TABLET | ORAL | 0 refills | Status: DC
Start: 2023-02-03 — End: 2023-02-24

## 2023-02-04 ENCOUNTER — Encounter (INDEPENDENT_AMBULATORY_CARE_PROVIDER_SITE_OTHER): Payer: Self-pay | Admitting: HEMATOLOGY-ONCOLOGY

## 2023-02-04 LAB — THROAT CULTURE, BETA HEMOLYTIC STREPTOCOCCUS: THROAT CULTURE: NORMAL

## 2023-02-10 ENCOUNTER — Other Ambulatory Visit (INDEPENDENT_AMBULATORY_CARE_PROVIDER_SITE_OTHER): Payer: Self-pay | Admitting: HEMATOLOGY-ONCOLOGY

## 2023-02-10 ENCOUNTER — Encounter (INDEPENDENT_AMBULATORY_CARE_PROVIDER_SITE_OTHER): Payer: Self-pay | Admitting: HEMATOLOGY-ONCOLOGY

## 2023-02-11 ENCOUNTER — Encounter (HOSPITAL_COMMUNITY): Payer: Self-pay

## 2023-02-11 ENCOUNTER — Other Ambulatory Visit: Payer: Self-pay

## 2023-02-11 ENCOUNTER — Ambulatory Visit
Admission: RE | Admit: 2023-02-11 | Discharge: 2023-02-11 | Disposition: A | Discharge status: Home / Self Care | Payer: 59 | Source: Ambulatory Visit | Attending: HEMATOLOGY-ONCOLOGY | Admitting: HEMATOLOGY-ONCOLOGY

## 2023-02-11 VITALS — BP 123/78 | HR 67 | Temp 98.1°F | Resp 18 | Ht 69.0 in | Wt 216.4 lb

## 2023-02-11 DIAGNOSIS — Z5112 Encounter for antineoplastic immunotherapy: Secondary | ICD-10-CM | POA: Insufficient documentation

## 2023-02-11 DIAGNOSIS — C642 Malignant neoplasm of left kidney, except renal pelvis: Secondary | ICD-10-CM | POA: Insufficient documentation

## 2023-02-11 LAB — COMPREHENSIVE METABOLIC PANEL, NON-FASTING
ALBUMIN/GLOBULIN RATIO: 1.3 (ref 0.8–1.4)
ALBUMIN: 4.4 g/dL (ref 3.5–5.7)
ALKALINE PHOSPHATASE: 69 U/L (ref 34–104)
ALT (SGPT): 28 U/L (ref 7–52)
ANION GAP: 4 mmol/L (ref 4–13)
AST (SGOT): 26 U/L (ref 13–39)
BILIRUBIN TOTAL: 0.6 mg/dL (ref 0.3–1.0)
BUN/CREA RATIO: 7 (ref 6–22)
BUN: 8 mg/dL (ref 7–25)
CALCIUM, CORRECTED: 9.3 mg/dL (ref 8.9–10.8)
CALCIUM: 9.6 mg/dL (ref 8.6–10.3)
CHLORIDE: 108 mmol/L — ABNORMAL HIGH (ref 98–107)
CO2 TOTAL: 27 mmol/L (ref 21–31)
CREATININE: 1.09 mg/dL (ref 0.60–1.30)
ESTIMATED GFR: 61 mL/min/{1.73_m2} (ref >59)
GLOBULIN: 3.4 (ref 2.9–5.4)
GLUCOSE: 88 mg/dL (ref 74–109)
OSMOLALITY, CALCULATED: 275 mosm/kg (ref 270–290)
POTASSIUM: 4 mmol/L (ref 3.5–5.1)
PROTEIN TOTAL: 7.8 g/dL (ref 6.4–8.9)
SODIUM: 139 mmol/L (ref 136–145)

## 2023-02-11 LAB — CBC WITH DIFF
BASOPHIL #: 0 10*3/uL (ref 0.00–0.10)
BASOPHIL %: 1 % (ref 0–1)
EOSINOPHIL #: 0.1 10*3/uL (ref 0.00–0.50)
EOSINOPHIL %: 3 % (ref 1–7)
HCT: 41.1 % (ref 31.2–41.9)
HGB: 13.9 g/dL (ref 10.9–14.3)
LYMPHOCYTE #: 0.8 10*3/uL — ABNORMAL LOW (ref 1.00–3.00)
LYMPHOCYTE %: 20 % (ref 16–44)
MCH: 29.9 pg (ref 24.7–32.8)
MCHC: 33.8 g/dL (ref 32.3–35.6)
MCV: 88.4 fL (ref 75.5–95.3)
MONOCYTE #: 0.4 10*3/uL (ref 0.30–1.00)
MONOCYTE %: 9 % (ref 5–13)
MPV: 9.1 fL (ref 7.9–10.8)
NEUTROPHIL #: 2.6 10*3/uL (ref 1.85–7.80)
NEUTROPHIL %: 66 % (ref 43–77)
PLATELETS: 183 10*3/uL (ref 140–440)
RBC: 4.65 10*6/uL (ref 3.63–4.92)
RDW: 13.8 % (ref 12.3–17.7)
WBC: 3.9 10*3/uL (ref 3.8–11.8)

## 2023-02-11 LAB — THYROXINE, FREE (FREE T4): THYROXINE (T4), FREE: 0.93 ng/dL (ref 0.58–1.64)

## 2023-02-11 LAB — THYROID STIMULATING HORMONE WITH FREE T4 REFLEX: TSH: 5.035 u[IU]/mL (ref 0.450–5.330)

## 2023-02-11 LAB — MAGNESIUM: MAGNESIUM: 1.8 mg/dL — ABNORMAL LOW (ref 1.9–2.7)

## 2023-02-11 MED ORDER — SODIUM CHLORIDE 0.9% FLUSH BAG - 250 ML
INTRAVENOUS | Status: DC | PRN
Start: 2023-02-11 — End: 2023-02-12

## 2023-02-11 MED ORDER — ONDANSETRON HCL (PF) 4 MG/2 ML INJECTION SOLUTION
8.0000 mg | Freq: Once | INTRAMUSCULAR | Status: DC | PRN
Start: 2023-02-11 — End: 2023-02-12

## 2023-02-11 MED ORDER — SODIUM CHLORIDE 0.9 % INTRAVENOUS SOLUTION
200.0000 mg | Freq: Once | INTRAVENOUS | Status: AC
Start: 2023-02-11 — End: 2023-02-11
  Administered 2023-02-11: 200 mg via INTRAVENOUS
  Administered 2023-02-11: 0 mg via INTRAVENOUS
  Filled 2023-02-11: qty 8

## 2023-02-11 MED ORDER — ONDANSETRON HCL 8 MG TABLET
8.0000 mg | ORAL_TABLET | Freq: Once | ORAL | Status: DC | PRN
Start: 2023-02-11 — End: 2023-02-12

## 2023-02-11 MED ORDER — HYDROCORTISONE SOD SUCCINATE 100 MG/2 ML VIAL WRAPPER
100.0000 mg | Freq: Once | INTRAMUSCULAR | Status: DC | PRN
Start: 2023-02-11 — End: 2023-02-12

## 2023-02-11 MED ORDER — DIPHENHYDRAMINE 50 MG/ML INJECTION SOLUTION
25.0000 mg | Freq: Once | INTRAMUSCULAR | Status: DC | PRN
Start: 2023-02-11 — End: 2023-02-12

## 2023-02-11 MED ORDER — MEPERIDINE (PF) 25 MG/ML INJECTION SOLUTION
12.5000 mg | Freq: Once | INTRAMUSCULAR | Status: DC | PRN
Start: 2023-02-11 — End: 2023-02-12

## 2023-02-11 MED ORDER — ALBUTEROL SULFATE 2.5 MG/3 ML (0.083 %) SOLUTION FOR NEBULIZATION
2.5000 mg | INHALATION_SOLUTION | Freq: Once | RESPIRATORY_TRACT | Status: DC | PRN
Start: 2023-02-11 — End: 2023-02-12

## 2023-02-11 MED ORDER — DIPHENHYDRAMINE 50 MG/ML INJECTION SOLUTION
50.0000 mg | Freq: Once | INTRAMUSCULAR | Status: DC | PRN
Start: 2023-02-11 — End: 2023-02-12

## 2023-02-11 MED ORDER — DEXTROSE 5% IN WATER (D5W) FLUSH BAG - 250 ML
INTRAVENOUS | Status: DC | PRN
Start: 2023-02-11 — End: 2023-02-12

## 2023-02-11 MED ORDER — EPINEPHRINE 1 MG/ML (1 ML) INJECTION SOLUTION
0.3000 mg | Freq: Once | INTRAMUSCULAR | Status: DC | PRN
Start: 2023-02-11 — End: 2023-02-12

## 2023-02-11 MED ORDER — FAMOTIDINE (PF) 20 MG/2 ML INTRAVENOUS SOLUTION
20.0000 mg | Freq: Once | INTRAVENOUS | Status: DC | PRN
Start: 2023-02-11 — End: 2023-02-12

## 2023-02-11 MED ORDER — ALBUTEROL SULFATE HFA 90 MCG/ACTUATION AEROSOL INHALER - RN
2.0000 | Freq: Once | RESPIRATORY_TRACT | Status: DC | PRN
Start: 2023-02-11 — End: 2023-02-12

## 2023-02-11 NOTE — Nurses Notes (Addendum)
4782 Pt ambulatory to OP Onc unit for labs/tx.  VS obtained. Immunotherapy Flow Sheet completed.  Pt states she was sick last week with fever, sore throat and body aches. Was prescribed antibiotic (Z-pack) but has finished the RX and feels better. Felecia Jan, RN   Patient Assessment/Symptom Management Patient Has No MD Appointment Today   Key: (+) Symptom present           (-)  Symptom not present If Symptom is Positive(+) a Nursing Note is required   Edema -   Uncontrolled Nausea -   Vomiting -   Inability to eat/drink -   Mouth Sores -   Diarrhea -   Constipation (? Last BM) -   Fatigue that interferes with ADL's -   Numbness/Tingling -change -   Other -   Fever/Signs & Symptoms of infection -   Nurse Initials TL   [phone] Labs drawn peripherally by hospital phlebotomist. Felecia Jan, RN  0900 IV started in R wrist via 22 G catheter. Excellent blood return and flushes easily. Felecia Jan, RN  [phone] Keytruda 200 mg IV infusion started. Felecia Jan, RN  [phone] Keytruda infusion completed. Line flushed with NS. Felecia Jan, RN  1100 VS obtained. Pt tolerated tx without difficulty. IV d/c'ed. Cath intact. Site clear. Drsg with Coban wrap applied. Felecia Jan, RN  1105 Pt left OP Onc unit ambulatory. No adverse reaction noted. Felecia Jan, RN

## 2023-02-18 ENCOUNTER — Encounter (INDEPENDENT_AMBULATORY_CARE_PROVIDER_SITE_OTHER): Payer: Self-pay | Admitting: HEMATOLOGY-ONCOLOGY

## 2023-02-23 ENCOUNTER — Encounter (INDEPENDENT_AMBULATORY_CARE_PROVIDER_SITE_OTHER): Payer: Self-pay | Admitting: HEMATOLOGY-ONCOLOGY

## 2023-02-24 ENCOUNTER — Other Ambulatory Visit: Payer: Self-pay

## 2023-02-24 ENCOUNTER — Ambulatory Visit (INDEPENDENT_AMBULATORY_CARE_PROVIDER_SITE_OTHER): Payer: 59 | Admitting: NURSE PRACTITIONER

## 2023-02-24 ENCOUNTER — Encounter (INDEPENDENT_AMBULATORY_CARE_PROVIDER_SITE_OTHER): Payer: Self-pay | Admitting: NURSE PRACTITIONER

## 2023-02-24 ENCOUNTER — Other Ambulatory Visit: Payer: 59 | Attending: NURSE PRACTITIONER | Admitting: NURSE PRACTITIONER

## 2023-02-24 VITALS — BP 117/78 | HR 73 | Temp 98.2°F | Resp 16 | Ht 69.02 in | Wt 217.8 lb

## 2023-02-24 DIAGNOSIS — J988 Other specified respiratory disorders: Secondary | ICD-10-CM | POA: Insufficient documentation

## 2023-02-24 DIAGNOSIS — R509 Fever, unspecified: Secondary | ICD-10-CM | POA: Insufficient documentation

## 2023-02-24 DIAGNOSIS — R52 Pain, unspecified: Secondary | ICD-10-CM

## 2023-02-24 LAB — COVID-19 ~~LOC~~ MOLECULAR LAB TESTING
INFLUENZA VIRUS TYPE A: NOT DETECTED
INFLUENZA VIRUS TYPE B: NOT DETECTED
RESPIRATORY SYNCTIAL VIRUS (RSV): NOT DETECTED
SARS-CoV-2: NOT DETECTED

## 2023-02-24 LAB — RAPID THROAT SCREEN, STREPTOCOCCUS, WITH REFLEX: THROAT RAPID SCREEN, STREPTOCOCCUS: NEGATIVE

## 2023-02-24 MED ORDER — PREDNISONE 10 MG TABLET
10.0000 mg | ORAL_TABLET | Freq: Every day | ORAL | 0 refills | Status: DC
Start: 2023-02-24 — End: 2023-04-08

## 2023-02-24 MED ORDER — AZITHROMYCIN 250 MG TABLET
ORAL_TABLET | ORAL | 0 refills | Status: DC
Start: 2023-02-24 — End: 2023-03-04

## 2023-02-24 NOTE — Progress Notes (Signed)
[facility], [facility]  [address]  [place] [address]      Acute Office Visit      NAME: Taylor Smith DOS: 02/24/2023   MRN: [id number] DOB: 30-Sep-1970     Chief Complaint: Sinus Pressure (Symptoms started Sunday.), Generalized Body Aches, and Fever      History of Present Illness: Taylor Smith is a 52 y.o. female who comes in today with complaints of generalized body aches, fever, chills, sore/scratchy throat, pain and pressure across the face. She states that symptoms have been going on for the past couple of days. She has been taking some motrin which has helped some with the pain.   She denies any nausea, vomiting, diarrhea, abd pain. She denies any recent sick contacts.        No other acute concerns or complaints at this time.    Medical History     I have reviewed and updated as appropriate the past medical, surgical, family, and social history today:  Medical History/Surgical History/Family History/Social History    Allergies:  Allergies   Allergen Reactions    Aldactone [Spironolactone] Itching       Medications:  Current Outpatient Medications   Medication Sig    azithromycin (ZITHROMAX) 250 mg Oral Tablet Take 500 mg (2 tab) on day 1; take 250 mg (1 tab) on days 2-5.    ergocalciferol, vitamin D2, (DRISDOL) 1,250 mcg (50,000 unit) Oral Capsule Take 1 Capsule (50,000 Units total) by mouth Every 7 days    hydroCHLOROthiazide (MICROZIDE) 12.5 mg Oral Capsule TAKE 1 CAPSULE (12.5 MG TOTAL) BY MOUTH ONCE A DAY INDICATIONS: HIGH BLOOD PRESSURE    Ibuprofen (MOTRIN) 600 mg Oral Tablet Take 1 Tablet (600 mg total) by mouth Every 6 hours as needed    levothyroxine (SYNTHROID) 137 mcg Oral Tablet TAKE 1 TABLET BY MOUTH EVERY MORNING.    omeprazole (PRILOSEC) 40 mg Oral Capsule, Delayed Release(E.C.) Take 1 Capsule (40 mg total) by mouth Once a day    pembrolizumab (KEYTRUDA) 25 mg/mL Intravenous Solution Infuse 8 mL (200 mg total) into a venous catheter Every 21 days     predniSONE (DELTASONE) 10 mg Oral Tablet Take 1 Tablet (10 mg total) by mouth Once a day    valsartan (DIOVAN) 40 mg Oral Tablet Take 2 Tablets (80 mg total) by mouth Twice daily (Patient not taking: Reported on 02/24/2023)       Problem List:  Patient Active Problem List    Diagnosis Date Noted    Kidney cancer, primary, with metastasis from kidney to other site, left (CMS HCC) 01/13/2022    S/P hysterectomy 01/06/2022    Vitamin D deficiency 11/07/2021    Hypothyroid 11/07/2021    GERD (gastroesophageal reflux disease) 11/07/2021    Primary hypertension 11/07/2021    Neural foraminal stenosis of cervical spine 11/07/2021    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   Constitutional:       General: She is not in acute distress.  Appearance: Normal appearance. She is not ill-appearing or diaphoretic.   HENT:      Head: Normocephalic and atraumatic.      Right Ear: Tympanic membrane normal.      Left Ear: Tympanic membrane normal.      Nose: Congestion present.      Mouth/Throat:      Pharynx: Oropharyngeal exudate and posterior oropharyngeal erythema present.   Cardiovascular:      Rate and Rhythm: Normal rate and regular rhythm.      Pulses: Normal pulses.      Heart sounds: Normal heart sounds, S1 normal and S2 normal.   Pulmonary:      Effort: Pulmonary effort is normal. No respiratory distress.      Breath sounds: Normal breath sounds. No wheezing or rhonchi.   Abdominal:      General: Abdomen is flat. Bowel sounds are normal.      Palpations: Abdomen is soft.   Skin:     General: Skin is warm and dry.      Capillary Refill: Capillary refill takes less than 2 seconds.   Neurological:      General: No focal  deficit present.      Mental Status: She is alert and oriented to person, place, and time.   Psychiatric:         Mood and Affect: Mood normal.            Assessment/Plan     Diagnosis/Plan:  Problem List Items Addressed This Visit    None  Visit Diagnoses       Fever, unspecified fever cause    -  Primary    Relevant Orders    COVID-19 SCREENING - SYMPTOMATIC (OP)    RAPID THROAT SCREEN, STREPTOCOCCUS, WITH REFLEX    Body aches        Relevant Orders    COVID-19 SCREENING - SYMPTOMATIC (OP)    RAPID THROAT SCREEN, STREPTOCOCCUS, WITH REFLEX    Congestion of respiratory tract        Relevant Medications    azithromycin (ZITHROMAX) 250 mg Oral Tablet    predniSONE (DELTASONE) 10 mg Oral Tablet    Other Relevant Orders    COVID-19 SCREENING - SYMPTOMATIC (OP)    RAPID THROAT SCREEN, STREPTOCOCCUS, WITH REFLEX           Viral panel and strep performed, patient will be contacted with results. Patient instructed to increase oral fluids.     Encounter Medications and Orders  Orders Placed This Encounter    RAPID THROAT SCREEN, STREPTOCOCCUS, WITH REFLEX    COVID-19 SCREENING - SYMPTOMATIC (OP)    azithromycin (ZITHROMAX) 250 mg Oral Tablet    predniSONE (DELTASONE) 10 mg Oral Tablet       Return if symptoms worsen or fail to improve.      Macario Golds, FNP-C  02/24/2023 10:55     This note was partially created using M*Modal fluency direct system (voice recognition software ) and is inherently subject to errors including those of syntax and "sound- alike" substitutions which may escape proofreading.  In such instances, original meaning may be extrapolated by contextual derivation.Marland Kitchen

## 2023-02-26 LAB — THROAT CULTURE, BETA HEMOLYTIC STREPTOCOCCUS: THROAT CULTURE: NORMAL

## 2023-03-04 ENCOUNTER — Ambulatory Visit (INDEPENDENT_AMBULATORY_CARE_PROVIDER_SITE_OTHER)
Admission: RE | Admit: 2023-03-04 | Discharge: 2023-03-04 | Disposition: A | Discharge status: Home / Self Care | Payer: 59 | Source: Ambulatory Visit | Attending: NURSE PRACTITIONER | Admitting: NURSE PRACTITIONER

## 2023-03-04 ENCOUNTER — Ambulatory Visit (INDEPENDENT_AMBULATORY_CARE_PROVIDER_SITE_OTHER): Payer: 59 | Admitting: NURSE PRACTITIONER

## 2023-03-04 ENCOUNTER — Encounter (INDEPENDENT_AMBULATORY_CARE_PROVIDER_SITE_OTHER): Payer: Self-pay | Admitting: HEMATOLOGY-ONCOLOGY

## 2023-03-04 ENCOUNTER — Encounter (INDEPENDENT_AMBULATORY_CARE_PROVIDER_SITE_OTHER): Payer: Self-pay | Admitting: NURSE PRACTITIONER

## 2023-03-04 ENCOUNTER — Other Ambulatory Visit: Payer: Self-pay

## 2023-03-04 ENCOUNTER — Ambulatory Visit
Admission: RE | Admit: 2023-03-04 | Discharge: 2023-03-04 | Disposition: A | Discharge status: Home / Self Care | Payer: 59 | Source: Ambulatory Visit | Attending: NURSE PRACTITIONER

## 2023-03-04 ENCOUNTER — Encounter (HOSPITAL_COMMUNITY): Payer: Self-pay

## 2023-03-04 VITALS — BP 122/79 | HR 68 | Temp 98.0°F | Resp 18

## 2023-03-04 VITALS — BP 142/91 | HR 73 | Temp 97.4°F | Ht 69.0 in | Wt 215.3 lb

## 2023-03-04 DIAGNOSIS — C642 Malignant neoplasm of left kidney, except renal pelvis: Secondary | ICD-10-CM | POA: Insufficient documentation

## 2023-03-04 DIAGNOSIS — Z7901 Long term (current) use of anticoagulants: Secondary | ICD-10-CM | POA: Insufficient documentation

## 2023-03-04 DIAGNOSIS — Z905 Acquired absence of kidney: Secondary | ICD-10-CM | POA: Insufficient documentation

## 2023-03-04 DIAGNOSIS — Z5112 Encounter for antineoplastic immunotherapy: Secondary | ICD-10-CM | POA: Insufficient documentation

## 2023-03-04 DIAGNOSIS — Z86718 Personal history of other venous thrombosis and embolism: Secondary | ICD-10-CM | POA: Insufficient documentation

## 2023-03-04 DIAGNOSIS — C7802 Secondary malignant neoplasm of left lung: Secondary | ICD-10-CM | POA: Insufficient documentation

## 2023-03-04 DIAGNOSIS — Z79620 Long term (current) use of immunosuppressive biologic: Secondary | ICD-10-CM | POA: Insufficient documentation

## 2023-03-04 DIAGNOSIS — Z2989 Encounter for other specified prophylactic measures: Secondary | ICD-10-CM

## 2023-03-04 LAB — CBC WITH DIFF
BASOPHIL #: 0 10*3/uL (ref 0.00–0.10)
BASOPHIL %: 0 % (ref 0–1)
EOSINOPHIL #: 0.2 10*3/uL (ref 0.00–0.50)
EOSINOPHIL %: 5 % (ref 1–7)
HCT: 43.6 % — ABNORMAL HIGH (ref 31.2–41.9)
HGB: 15.2 g/dL — ABNORMAL HIGH (ref 10.9–14.3)
LYMPHOCYTE #: 1.1 10*3/uL (ref 1.00–3.00)
LYMPHOCYTE %: 24 % (ref 16–44)
MCH: 30.9 pg (ref 24.7–32.8)
MCHC: 34.8 g/dL (ref 32.3–35.6)
MCV: 88.8 fL (ref 75.5–95.3)
MONOCYTE #: 0.4 10*3/uL (ref 0.30–1.00)
MONOCYTE %: 10 % (ref 5–13)
MPV: 9.6 fL (ref 7.9–10.8)
NEUTROPHIL #: 2.8 10*3/uL (ref 1.85–7.80)
NEUTROPHIL %: 61 % (ref 43–77)
PLATELETS: 191 10*3/uL (ref 140–440)
RBC: 4.91 10*6/uL (ref 3.63–4.92)
RDW: 13.5 % (ref 12.3–17.7)
WBC: 4.5 10*3/uL (ref 3.8–11.8)

## 2023-03-04 LAB — THYROXINE, FREE (FREE T4): THYROXINE (T4), FREE: 1.08 ng/dL (ref 0.61–1.12)

## 2023-03-04 LAB — COMPREHENSIVE METABOLIC PANEL, NON-FASTING
ALBUMIN/GLOBULIN RATIO: 1.4 (ref 0.8–1.4)
ALBUMIN: 4.5 g/dL (ref 3.5–5.7)
ALKALINE PHOSPHATASE: 72 U/L (ref 34–104)
ALT (SGPT): 23 U/L (ref 7–52)
ANION GAP: 9 mmol/L (ref 4–13)
AST (SGOT): 23 U/L (ref 13–39)
BILIRUBIN TOTAL: 0.9 mg/dL (ref 0.3–1.0)
BUN/CREA RATIO: 8 (ref 6–22)
BUN: 9 mg/dL (ref 7–25)
CALCIUM, CORRECTED: 9.5 mg/dL (ref 8.9–10.8)
CALCIUM: 9.9 mg/dL (ref 8.6–10.3)
CHLORIDE: 105 mmol/L (ref 98–107)
CO2 TOTAL: 27 mmol/L (ref 21–31)
CREATININE: 1.06 mg/dL (ref 0.60–1.30)
ESTIMATED GFR: 63 mL/min/{1.73_m2} (ref >59)
GLOBULIN: 3.3 (ref 2.0–3.5)
GLUCOSE: 75 mg/dL (ref 74–109)
OSMOLALITY, CALCULATED: 279 mosm/kg (ref 270–290)
POTASSIUM: 4 mmol/L (ref 3.5–5.1)
PROTEIN TOTAL: 7.8 g/dL (ref 6.4–8.9)
SODIUM: 141 mmol/L (ref 136–145)

## 2023-03-04 LAB — THYROID STIMULATING HORMONE WITH FREE T4 REFLEX: TSH: 5.092 u[IU]/mL (ref 0.450–5.330)

## 2023-03-04 LAB — MAGNESIUM: MAGNESIUM: 2.1 mg/dL (ref 1.9–2.7)

## 2023-03-04 MED ORDER — DEXTROSE 5% IN WATER (D5W) FLUSH BAG - 250 ML
INTRAVENOUS | Status: DC | PRN
Start: 2023-03-04 — End: 2023-03-05

## 2023-03-04 MED ORDER — FAMOTIDINE (PF) 20 MG/2 ML INTRAVENOUS SOLUTION
20.0000 mg | Freq: Once | INTRAVENOUS | Status: DC | PRN
Start: 2023-03-04 — End: 2023-03-05

## 2023-03-04 MED ORDER — ALBUTEROL SULFATE HFA 90 MCG/ACTUATION AEROSOL INHALER - RN
2.0000 | Freq: Once | RESPIRATORY_TRACT | Status: DC | PRN
Start: 2023-03-04 — End: 2023-03-05

## 2023-03-04 MED ORDER — MEPERIDINE (PF) 25 MG/ML INJECTION SOLUTION
12.5000 mg | Freq: Once | INTRAMUSCULAR | Status: DC | PRN
Start: 2023-03-04 — End: 2023-03-05

## 2023-03-04 MED ORDER — DIPHENHYDRAMINE 50 MG/ML INJECTION SOLUTION
50.0000 mg | Freq: Once | INTRAMUSCULAR | Status: DC | PRN
Start: 2023-03-04 — End: 2023-03-05

## 2023-03-04 MED ORDER — SODIUM CHLORIDE 0.9% FLUSH BAG - 250 ML
INTRAVENOUS | Status: DC | PRN
Start: 2023-03-04 — End: 2023-03-05

## 2023-03-04 MED ORDER — HYDROCORTISONE SOD SUCCINATE 100 MG/2 ML VIAL WRAPPER
100.0000 mg | Freq: Once | INTRAMUSCULAR | Status: DC | PRN
Start: 2023-03-04 — End: 2023-03-05

## 2023-03-04 MED ORDER — SODIUM CHLORIDE 0.9 % INTRAVENOUS SOLUTION
200.0000 mg | Freq: Once | INTRAVENOUS | Status: AC
Start: 2023-03-04 — End: 2023-03-04
  Administered 2023-03-04: 200 mg via INTRAVENOUS
  Administered 2023-03-04: 0 mg via INTRAVENOUS
  Filled 2023-03-04: qty 8

## 2023-03-04 MED ORDER — EPINEPHRINE 1 MG/ML (1 ML) INJECTION SOLUTION
0.3000 mg | Freq: Once | INTRAMUSCULAR | Status: DC | PRN
Start: 2023-03-04 — End: 2023-03-05

## 2023-03-04 MED ORDER — ONDANSETRON HCL (PF) 4 MG/2 ML INJECTION SOLUTION
8.0000 mg | Freq: Once | INTRAMUSCULAR | Status: DC | PRN
Start: 2023-03-04 — End: 2023-03-05

## 2023-03-04 MED ORDER — ONDANSETRON HCL 8 MG TABLET
8.0000 mg | ORAL_TABLET | Freq: Once | ORAL | Status: DC | PRN
Start: 2023-03-04 — End: 2023-03-05

## 2023-03-04 MED ORDER — DIPHENHYDRAMINE 50 MG/ML INJECTION SOLUTION
25.0000 mg | Freq: Once | INTRAMUSCULAR | Status: DC | PRN
Start: 2023-03-04 — End: 2023-03-05

## 2023-03-04 MED ORDER — ALBUTEROL SULFATE 2.5 MG/3 ML (0.083 %) SOLUTION FOR NEBULIZATION
2.5000 mg | INHALATION_SOLUTION | Freq: Once | RESPIRATORY_TRACT | Status: DC | PRN
Start: 2023-03-04 — End: 2023-03-05

## 2023-03-04 NOTE — Cancer Center Note (Signed)
Department of Hematology/Oncology      Name: Taylor Smith  HQI:[id number]  Date of Birth: Nov 23, 1970  Encounter Date: 03/04/2023    REFERRING PROVIDER:  Macario Golds, FNP-C  [address]  [place],  [place] [address]    REASON FOR OFFICE VISIT:  management of stage IV clear cell renal carcinoma    HISTORY OF PRESENT ILLNESS:A  Taylor Smith is a 52 y.o. 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      ONCOLOGY HISTORY:  She was originally diagnosed after having a biopsy in the genitourinary area.  It came back consistent with clear cell renal cell carcinoma, which prompted an imaging study of the chest, abdomen, and pelvis.  She was found to have a massive left kidney mass with numerous small pulmonary nodules.  The renal mass appears to be involving the renal vein but not the inferior vena cava. Prior to this, she was not having any symptoms.  Specifically, she did not have any hematuria or history of iron-deficiency anemia.  She does report back pain which has been going on for the past year but it is unclear whether that was related or not.  She has had a Left cytoreductive Nephrectomy in 5/24 with clear margins and 5/5 negative for involvement. he also had no evidence of any lymphovascular invasion at the time.     12/10/2022: The patient is here for follow up of metastatic renal cell carcinoma.  She was seen at [facility] and has been told that she is essentially cancer-free at this time and no additional therapy was advised.    12/31/22:  Patient is here for follow up of metastatic renal cell carcinoma.  She is due for [facility] today.   She reports that she has shortness of breath mostly with exertion but sometimes without exertion.   She reports that otherwise she is feeling well.  She denies any issues with diarrhea, increased cough, or abdominal pain.    01/21/2023:  Patient is here for follow up on Metastatic renal cell Carcinoma.  She is due  for [facility] today.    She denies any new issues or complaints.  She reports that she is doing well.     03/04/23:   Patient is here for follow up on Metastatic renal cell Carcinoma.  She does have diarrhea after each meal following treatment for about 2.5 weeks.  She does not have to use any Imodium or lomotil for this.   She is tolerating Keytruda without significant issues.       ROS:   Review of Systems   Constitutional:  Positive for fatigue. Negative for appetite change and chills.   HENT:   Negative for sore throat and trouble swallowing.    Eyes:  Negative for eye problems.   Respiratory:  Positive for shortness of breath (with exertion). Negative for cough.    Cardiovascular:  Negative for chest pain and leg swelling.   Gastrointestinal:  Negative for abdominal pain, constipation, diarrhea, nausea and vomiting.   Genitourinary:  Negative for difficulty urinating, dyspareunia, dysuria and hematuria.    Musculoskeletal:  Positive for arthralgias (knees mostly). Negative for gait problem.   Skin:  Negative for rash.   Neurological:  Negative for gait problem.   Hematological:  Negative for adenopathy.   Psychiatric/Behavioral:  Negative for depression.         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)     Kidney cancer, primary, with metastasis from kidney to other site, left (CMS [facility])          Past Surgical History:   Procedure Laterality Date    HX CESAREAN SECTION      HX CHOLECYSTECTOMY      HX CYST REMOVAL Right     HX HYSTERECTOMY      NEPHROURETERECTOMY Left     TOTAL THYROIDECTOMY           Social History     Socioeconomic History    Marital status: Married     Spouse name: Not on file    Number of children: Not on file    Years of education: Not on file    Highest education level: Not on file   Occupational History    Not on file    Tobacco Use    Smoking status: Never    Smokeless tobacco: Never   Vaping Use    Vaping status: Never Used   Substance and Sexual Activity    Alcohol use: Never    Drug use: Never    Sexual activity: Yes     Partners: Male   Other Topics Concern    Not on file   Social History Narrative    Not on file     Social Determinants of Health     Financial Resource Strain: Not on file   Transportation Needs: No Transportation Needs (10/10/2022)    Received from [facility]    PRAPARE - Transportation     In the past 12 months, has lack of transportation kept you from medical appointments or from getting medications?: No     Lack of Transportation (Non-Medical): No   Social Connections: Not on file   Intimate Partner Violence: High Risk (01/13/2022)    Intimate Partner Violence     SDOH Domestic Violence: No   Housing Stability: Not on file     Family Medical History:       Problem Relation (Age of Onset)    Cancer Maternal Aunt, Maternal Grandmother    Colon Cancer Maternal Uncle    Lung Cancer Mother    Melanoma Maternal Aunt            Current Outpatient Medications   Medication Sig    ergocalciferol, vitamin D2, (DRISDOL) 1,250 mcg (50,000 unit) Oral Capsule Take 1 Capsule (50,000 Units total) by mouth Every 7 days    hydroCHLOROthiazide (MICROZIDE) 12.5 mg Oral Capsule TAKE 1 CAPSULE (12.5 MG TOTAL) BY MOUTH ONCE A DAY INDICATIONS: HIGH BLOOD PRESSURE    Ibuprofen (MOTRIN) 600 mg Oral Tablet Take 1 Tablet (600 mg total) by mouth Every 6 hours as needed    levothyroxine (SYNTHROID) 137 mcg Oral Tablet TAKE 1 TABLET BY MOUTH EVERY MORNING.    omeprazole (PRILOSEC) 40 mg Oral Capsule, Delayed Release(E.C.) Take 1 Capsule (40 mg total) by mouth Once a day    pembrolizumab (KEYTRUDA) 25 mg/mL Intravenous Solution Infuse 8 mL (200 mg total) into a venous catheter Every 21 days    predniSONE (DELTASONE) 10 mg Oral Tablet Take 1 Tablet (10 mg total) by mouth Once a day     valsartan (DIOVAN) 40 mg Oral Tablet Take 2 Tablets (80 mg total) by mouth Twice daily  Allergies   Allergen Reactions    Aldactone [Spironolactone] Itching       PHYSICAL EXAM:  BP (!) 142/91 (Site: Left Arm, Patient Position: Sitting, Cuff Size: Adult)   Pulse 73   Temp 36.3 C (97.4 F) (Temporal)   Ht 1.753 m (5\' 9" )   Wt 97.7 kg (215 lb 4.8 oz)   SpO2 100%   BMI 31.79 kg/m         ECOG Status: (0) Fully active, able to carry on all predisease performance without restriction   Physical Exam  Vitals reviewed.   Constitutional:       Appearance: Normal appearance. She is normal weight.   Cardiovascular:      Rate and Rhythm: Normal rate and regular rhythm.      Pulses: Normal pulses.      Heart sounds: Normal heart sounds.   Pulmonary:      Effort: Pulmonary effort is normal.      Breath sounds: Normal breath sounds.   Abdominal:      General: Bowel sounds are normal.      Palpations: Abdomen is soft.   Lymphadenopathy:      Head:      Right side of head: No submental, submandibular, tonsillar, preauricular, posterior auricular or occipital adenopathy.      Left side of head: No submental, submandibular, tonsillar, preauricular, posterior auricular or occipital adenopathy.      Cervical: No cervical adenopathy.      Right cervical: No superficial, deep or posterior cervical adenopathy.     Left cervical: No superficial, deep or posterior cervical adenopathy.      Upper Body:      Right upper body: No supraclavicular or axillary adenopathy.      Left upper body: No supraclavicular or axillary adenopathy.   Skin:     General: Skin is warm and dry.      Capillary Refill: Capillary refill takes less than 2 seconds.   Neurological:      Mental Status: She is alert and oriented to person, place, and time. Mental status is at baseline.         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     Technique:  CT imaging of the chest, abdomen, and pelvis was performed   following the administration of IV contrast. Imaging was performed through   the kidneys before the administration of contrast, followed by arterial   phase imaging through the liver, and portal venous phase imaging of the   chest, abdomen, and pelvis.   Iodinated contrast was used due to the   indications for the examination, to improve disease detection and to   further define anatomy.   Coronal and sagittal reformatted images of the   abdomen and pelvis were generated and reviewed. 3-D maximum intensity   projection (MIP) reconstructions of the chest were performed to potentially   increase study sensitivity.  Findings:   Chest:   - Chest wall and Thoracic Inlet: No masses or lymphadenopathy.   Thyroidectomy changes.     - Mediastinum and Hila: No masses or lymphadenopathy.     - Thoracic Vessels: Normal caliber of the thoracic aorta and main pulmonary   artery. Mild atherosclerotic plaque.     - Heart and Pericardium: Normal heart size.  No pericardial effusion.     - Lungs and Airways: Small poorly nodules are unchanged from prior. For   example measuring 5 mm in the right lower lobe along the fissure on series   7 image 276. No new suspicious or enlarging nodule.     - Pleura: No pleural effusions.       Abdomen and pelvis:     - Liver: Normal in morphology and enhancement.  No suspicious hepatic   masses are identified.  The portal and hepatic veins are patent.     -  Biliary and Gallbladder: No intrahepatic or extrahepatic bile duct   dilatation. The gallbladder is surgically absent.     - Spleen: Normal in appearance.      - Pancreas: Normal in appearance.     - Adrenal Glands: [Colectomy.     - Kidneys: Postsurgical changes from interval total left nephrectomy. No   suspicious soft tissue nodularity or enhancement along the operative site.   No new suspicious renal lesions. No hydronephrosis.     - Abdominal and Pelvic Vasculature: No abdominal aortic aneurysm. Improved   collateral vessels in the left upper quadrant.     - Gastrointestinal Tract: No abnormal dilation or wall thickening.     - Peritoneum/Mesentery/Retroperitoneum: No free fluid.  No free   intraperitoneal air.     - Lymph Nodes: No retroperitoneal or mesenteric lymphadenopathy.      - Bladder: Normal in appearance.     - Pelvic Organs: Unremarkable.     - Body Wall: Healed midline laparotomy incision.     - Musculoskeletal:  Unchanged partially sclerotic T1 and T3 vertebral body   lesions. No new suspicious osseous lesion.       Impression:   1. Interval post procedure changes from total left nephrectomy. There is   no evidence of local residual disease.   2.  No new evidence of metastasis in the chest, abdomen, or pelvis.       Electronically Reviewed by:  Manus Rudd, MD, [facility]   Electronically Reviewed on:  12/02/2022 1:26 PM     I have reviewed the images and concur with the above findings.     Electronically Signed by:  [place] Stall, MD, [facility]   Electronically Signed on:  12/02/2022 2:33 PM     LABS:  CBC  Diff   Lab Results   Component Value Date/Time    WBC 4.5 03/04/2023 08:10 AM    HGB 15.2 (H) 03/04/2023 08:10 AM    HCT 43.6 (H) 03/04/2023 08:10 AM    PLTCNT 191 03/04/2023 08:10 AM    RBC 4.91 03/04/2023 08:10 AM    MCV 88.8 03/04/2023 08:10 AM    MCHC 34.8 03/04/2023 08:10 AM    MCH 30.9 03/04/2023 08:10 AM    RDW 13.5 03/04/2023 08:10 AM    MPV 9.6 03/04/2023 08:10 AM    Lab  Results   Component Value Date/Time    PMNS 61 03/04/2023 08:10 AM    LYMPHOCYTES 24 03/04/2023 08:10 AM    EOSINOPHIL 5 03/04/2023 08:10 AM  MONOCYTES 10 03/04/2023 08:10 AM    BASOPHILS 0 03/04/2023 08:10 AM    BASOPHILS 0.00 03/04/2023 08:10 AM    PMNABS 2.80 03/04/2023 08:10 AM    LYMPHSABS 1.10 03/04/2023 08:10 AM    EOSABS 0.20 03/04/2023 08:10 AM    MONOSABS 0.40 03/04/2023 08:10 AM            Comprehensive Metabolic Profile    Lab Results   Component Value Date    SODIUM 141 03/04/2023    POTASSIUM 4.0 03/04/2023    CHLORIDE 105 03/04/2023    CO2 27 03/04/2023    ANIONGAP 9 03/04/2023    BUN 9 03/04/2023    CREATININE 1.06 03/04/2023    ALBUMIN 4.5 03/04/2023    CALCIUM 9.9 03/04/2023    GLUCOSENF 75 03/04/2023    ALKPHOS 72 03/04/2023    ALT 23 03/04/2023    AST 23 03/04/2023    TOTBILIRUBIN 0.9 03/04/2023    TOTALPROTEIN 7.8 03/04/2023         ESTIMATED GFR   Date Value Ref Range Status   03/04/2023 63 >59 mL/min/1.19m^2 Final       SIGNATERA TEST RESULT   Date Value Ref Range Status   12/10/2022 NEGATIVE  Final     SIGNATERA MTM READOUT   Date Value Ref Range Status   12/10/2022 0 MTM/ml Final     Comment:     Limitations  Signatera is a personalized, tumor-informed test for the longitudinal detection of circulating tumor DNA (ctDNA). Interval testing is recommended for all patients. Studies have demonstrated that when ctDNA is detected (Signatera Positive) following surgery or definitive treatment, the risk for disease relapse is high without further treatment. Conversely, when ctDNA is not detected, the patient may be considered at lower risk for relapse. For those with multiple timepoints, upward trending ctDNA levels are suggestive of increasing tumor burden (1,2). For a single time point in isolation, the absolute MTM/mL value has no known clinical significance and should not be compared across patients. Test results should be interpreted in context of other clinicopathological features. ctDNA  detection sensitivity may be limited due to blood collection within two weeks of surgery and while the patient is on therapy. Signatera is a quantitative test and reports in units of mean tumor molecules per   ml (MTM/mL), which is comprised of three measured components (plasma volume, cell free DNA (cfDNA) concentration, and Variant Allele Frequency (VAF)). The MTM/mL number will be qualified if any measured component falls outside the analytical measurement range for that component. The analytical sensitivity is 95% at the limit of detection (0.3 MTM/mL). Results obtained are specific to the assessed time point. A negative test result does not definitively indicate the absence of cancer. This test is not designed to detect or report germline variation, nor does it infer hereditary cancer risk for the patient. Each Signatera assay is designed to a single tumor for a given patient. At this time, multiple personalized Signatera assays cannot be developed for the same patient. This test is designed to detect ctDNA from the assayed tumor only; new primary tumors will not be detected. There is a low risk that a new primary may share a variant that could interfere with the Signatera test.   Testing cannot be performed in patients who are pregnant, have a history of bone marrow transplant, or history of blood transfusion within three months. This test is expected to have limited sensitivity in cancer types such as GIST, renal cell carcinomas, primary  brain tumors, and lymphoma due to limited ctDNA shed. 1 Bratman SV, [place] Senate, Iafolla MAJ, et al. Personalized circulating tumor DNA analysis as a predictive biomarker in solid tumor patients treated with pembrolizumab. Nature Cancer. 2020;1(9):873-881. 2 Henriksen TV, Veronia Beets, et al., Circulating Tumor DNA in Stage III Colorectal Cancer, beyond Minimal Residual Disease Detection, toward Assessment of Adjuvant Therapy Efficacy and Clinical Behavior of Recurrences. Clin  Cancer Res. 2021; 28(3):507-517.  Methodology  FFPE samples are assessed by a pathologist to identify tumor margins and percent tumor content. Tumor DNA is extracted using Qiagen AllPrep. Whole genomic DNA is isolated from peripheral blood using QIAamp DNA Blood Mini Kit to provide a baseline DNA sequence. Circulating tumor DNA (ctDNA) is extracted from plasma derived from whole blood samples collected in cell-free DNA blood tubes (Streck) using the QIAsymphony automated or manual extraction method (Qiagen). Using a proprietary algorithm, putative, clonal variants present in the tumor but absent in the germline DNA are identified to design the customized multiplex PCR assay. Whole-exome sequencing is performed on tumor and peripheral blood DNA using KAPA HyperPrep Warden/ranger (Roche) with a custom xGen exome capture (IDT). Using a proprietary algorithm, putative clonal variants present in the tumor but absent in the germline DNA are identified to design the customized multiplex PCR assay. Circulating tumor DNA is extracted from   plasma collected in Streck tubes using [employer] proprietary methods. The customized PCR assays are run to detect presence or absence of these variants within circulating plasma. A patient's plasma sample is considered ctDNA positive when at least two individual-specific tumor variants are detected. When fewer than two individual-specific tumor variants are observed, a negative result is issued. Tumor variation outside of the individual, tumor specific variants is not assessed. Pathology services and whole exome sequencing are performed at [facility] (CLIA ID# [id number]) [address], [place], [place] [address].  Disclaimer  The extraction, Designer, television/film set, and sequencing for this test were performed by [facility] [address], [place], [place] [address] (CLIA [place] [id number]). The data analysis and reporting for this test were performed by [facility] [address]  [address], [place], [place] [address] (CLIA [place] [id number]). This test was developed and its performance characteristics determined by [facility]. The test has not been cleared or approved by the U.S. Food and Drug Administration (FDA). CAP accredited, ISO 13485 certified, and CLIA certified. Pathology services and whole exome sequencing for this test were performed by [facility], [address] [place] [address], CLIA # [id number].  2021 [employer] All Rights Reserved.         ASSESSMENT:    ICD-10-CM    1. Kidney cancer, primary, with metastasis from kidney to other site, left (CMS HCC)  C64.2       2. Encounter for immunotherapy  Z29.89                  PLAN:   1. All relevant medical records were reviewed including available pertinent provider notes, procedure notes, imaging, laboratory, and pathology.   2. All pertinent labs and/or imaging were reviewed with the patient.   3. Stage IV renal carcinoma:  She has had a great response to treatment, but has known pulmonary metastases which have not been definitively addressed.   Signatera was negative on 12/10/22.   Dr. Damita Lack believes that at a minimum, it would be reasonable to utilize pembrolizumab in a pseudo adjuvant fashion for 12 months.  She would also  meet criteria for treatment of metastatic disease, for which the medication would be used for 2 years.  We reviewed the information from the [facility] clinical trials in regard to survival.   Keytruda was restarted after discussion with Dr. Damita Lack.   CT  Chest abdomen pelvis scan for 3 months follow up scheduled 03/09/23.   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  5. Pain:  Improved.      Taylor Smith was given the chance to ask questions, and these were answered to their satisfaction. The patient is welcome to call with any questions or concerns in the meantime.     On the day of the encounter, a total of 23 minutes was spent on this patient encounter including review of historical information, examination,  documentation and post-visit activities.   Return in about 6 weeks (around 04/15/2023).     Taylor Staff APRN, FNP-BC, AOCNP, 03/04/2023 , 08:44   You can see your note(s) in [facility]. It is common for you to encounter certain medical terminology which may be unfamiliar to you. You might see results before your provider does so please give at least 2 business days for review. Please have this understanding, that NOT all abnormal results are significant. Our office will contact you for any urgent or emergent action if necessary. If you have any questions or concerns, feel free to send a MyChart message or call the office. Please call with any new or concerning symptoms.     The patient's insurance company bears full legal and financial responsibility resulting from any deviations that they cause to my recommended treatment plan.   CC:  Macario Golds, FNP-C  [address]  [place] [address]    Macario Golds, FNP-C  [address]  [place],  [place] [address]    This note was partially generated using MModal Fluency Direct system, and there may be some incorrect words, spellings, and punctuation that were not noted in checking the note before saving.

## 2023-03-04 NOTE — Nurses Notes (Addendum)
0847 Pt ambulatory to OP Onc unit for tx. Pt seen by Marvene Staff NP this am and had labs drawn peripherally prior to [facility] appt.  Immunotherapy Flow Sheet completed.  Pt states she was sick last week with nasal congestion. Was prescribed antibiotic but has finished the RX and feels better. Felecia Jan, RN   Patient Assessment/Symptom Management Patient Has No MD Appointment Today   Key: (+) Symptom present           (-)  Symptom not present If Symptom is Positive(+) a Nursing Note is required   Edema -   Uncontrolled Nausea -   Vomiting -   Inability to eat/drink -   Mouth Sores -   Diarrhea -   Constipation (? Last BM) -   Fatigue that interferes with ADL's -   Numbness/Tingling -change -   Other -   Fever/Signs & Symptoms of infection -   Nurse Initials TL   [phone] IV started in R lower FA via 22 G catheter. Excellent blood return and flushes easily. Felecia Jan, RN  [phone] Keytruda 200 mg IV infusion started. Felecia Jan, RN  [phone] Keytruda infusion completed. Line flushed with NS. VS obtained. Pt tolerated tx without difficulty. Felecia Jan, RN  1020 IV d/c'ed. Cath intact. Site clear. Drsg with Coban wrap applied. Felecia Jan, RN  1025 Pt left OP Onc unit ambulatory. No adverse reaction noted. Felecia Jan, RN

## 2023-03-04 NOTE — Nursing Note (Signed)
Signatera mailed.

## 2023-03-05 ENCOUNTER — Other Ambulatory Visit (INDEPENDENT_AMBULATORY_CARE_PROVIDER_SITE_OTHER): Payer: Self-pay

## 2023-03-06 ENCOUNTER — Ambulatory Visit (INDEPENDENT_AMBULATORY_CARE_PROVIDER_SITE_OTHER): Payer: Self-pay | Admitting: NURSE PRACTITIONER

## 2023-03-09 ENCOUNTER — Encounter (INDEPENDENT_AMBULATORY_CARE_PROVIDER_SITE_OTHER): Payer: Self-pay | Admitting: HEMATOLOGY-ONCOLOGY

## 2023-03-09 ENCOUNTER — Other Ambulatory Visit: Payer: Self-pay

## 2023-03-09 ENCOUNTER — Inpatient Hospital Stay
Admission: RE | Admit: 2023-03-09 | Discharge: 2023-03-09 | Disposition: A | Discharge status: Home / Self Care | Payer: 59 | Source: Ambulatory Visit | Attending: NURSE PRACTITIONER | Admitting: NURSE PRACTITIONER

## 2023-03-09 DIAGNOSIS — C642 Malignant neoplasm of left kidney, except renal pelvis: Secondary | ICD-10-CM | POA: Insufficient documentation

## 2023-03-09 MED ORDER — IOHEXOL 350 MG IODINE/ML INTRAVENOUS SOLUTION
100.0000 mL | INTRAVENOUS | Status: AC
Start: 2023-03-09 — End: 2023-03-09
  Administered 2023-03-09: 75 mL via INTRAVENOUS

## 2023-03-09 MED ORDER — BARIUM SULFATE 2 % (W/V) ORAL SUSPENSION
450.0000 mL | ORAL | Status: AC
Start: 2023-03-09 — End: 2023-03-09
  Administered 2023-03-09: 450 mL via ORAL

## 2023-03-10 ENCOUNTER — Encounter (INDEPENDENT_AMBULATORY_CARE_PROVIDER_SITE_OTHER): Payer: Self-pay | Admitting: NURSE PRACTITIONER

## 2023-03-10 DIAGNOSIS — R933 Abnormal findings on diagnostic imaging of other parts of digestive tract: Secondary | ICD-10-CM

## 2023-03-11 LAB — SIGNATERA
SIGNATERA MTM READOUT: 0 MTM/ml
SIGNATERA TEST RESULT: NEGATIVE

## 2023-03-18 ENCOUNTER — Other Ambulatory Visit (INDEPENDENT_AMBULATORY_CARE_PROVIDER_SITE_OTHER): Payer: Self-pay | Admitting: HEMATOLOGY-ONCOLOGY

## 2023-03-25 ENCOUNTER — Other Ambulatory Visit: Payer: Self-pay

## 2023-03-25 ENCOUNTER — Inpatient Hospital Stay (INDEPENDENT_AMBULATORY_CARE_PROVIDER_SITE_OTHER)
Admission: RE | Admit: 2023-03-25 | Discharge: 2023-03-25 | Disposition: A | Discharge status: Home / Self Care | Payer: 59 | Source: Ambulatory Visit | Attending: HEMATOLOGY-ONCOLOGY | Admitting: HEMATOLOGY-ONCOLOGY

## 2023-03-25 ENCOUNTER — Encounter (HOSPITAL_COMMUNITY): Payer: Self-pay

## 2023-03-25 ENCOUNTER — Encounter (INDEPENDENT_AMBULATORY_CARE_PROVIDER_SITE_OTHER): Payer: Self-pay | Admitting: NURSE PRACTITIONER

## 2023-03-25 ENCOUNTER — Encounter (INDEPENDENT_AMBULATORY_CARE_PROVIDER_SITE_OTHER): Payer: Self-pay | Admitting: HEMATOLOGY-ONCOLOGY

## 2023-03-25 ENCOUNTER — Other Ambulatory Visit (INDEPENDENT_AMBULATORY_CARE_PROVIDER_SITE_OTHER): Payer: Self-pay | Admitting: NURSE PRACTITIONER

## 2023-03-25 ENCOUNTER — Ambulatory Visit
Admission: RE | Admit: 2023-03-25 | Discharge: 2023-03-25 | Disposition: A | Discharge status: Home / Self Care | Payer: 59 | Source: Ambulatory Visit | Attending: HEMATOLOGY-ONCOLOGY | Admitting: HEMATOLOGY-ONCOLOGY

## 2023-03-25 VITALS — BP 148/89 | HR 57 | Temp 98.5°F | Resp 18 | Ht 69.0 in | Wt 219.9 lb

## 2023-03-25 DIAGNOSIS — C642 Malignant neoplasm of left kidney, except renal pelvis: Secondary | ICD-10-CM

## 2023-03-25 DIAGNOSIS — Z23 Encounter for immunization: Secondary | ICD-10-CM | POA: Insufficient documentation

## 2023-03-25 DIAGNOSIS — E039 Hypothyroidism, unspecified: Secondary | ICD-10-CM

## 2023-03-25 DIAGNOSIS — C799 Secondary malignant neoplasm of unspecified site: Secondary | ICD-10-CM | POA: Insufficient documentation

## 2023-03-25 DIAGNOSIS — C649 Malignant neoplasm of unspecified kidney, except renal pelvis: Secondary | ICD-10-CM | POA: Insufficient documentation

## 2023-03-25 DIAGNOSIS — Z5112 Encounter for antineoplastic immunotherapy: Secondary | ICD-10-CM | POA: Insufficient documentation

## 2023-03-25 LAB — COMPREHENSIVE METABOLIC PANEL, NON-FASTING
ALBUMIN/GLOBULIN RATIO: 1.3 (ref 0.8–1.4)
ALBUMIN: 4.3 g/dL (ref 3.5–5.7)
ALKALINE PHOSPHATASE: 74 U/L (ref 34–104)
ALT (SGPT): 27 U/L (ref 7–52)
ANION GAP: 14 mmol/L — ABNORMAL HIGH (ref 4–13)
AST (SGOT): 28 U/L (ref 13–39)
BILIRUBIN TOTAL: 0.8 mg/dL (ref 0.3–1.0)
BUN/CREA RATIO: 12 (ref 6–22)
BUN: 13 mg/dL (ref 7–25)
CALCIUM, CORRECTED: 9.4 mg/dL (ref 8.9–10.8)
CALCIUM: 9.6 mg/dL (ref 8.6–10.3)
CHLORIDE: 105 mmol/L (ref 98–107)
CO2 TOTAL: 22 mmol/L (ref 21–31)
CREATININE: 1.07 mg/dL (ref 0.60–1.30)
ESTIMATED GFR: 62 mL/min/{1.73_m2} (ref >59)
GLOBULIN: 3.3 (ref 2.0–3.5)
GLUCOSE: 136 mg/dL — ABNORMAL HIGH (ref 74–109)
OSMOLALITY, CALCULATED: 284 mosm/kg (ref 270–290)
POTASSIUM: 3.9 mmol/L (ref 3.5–5.1)
PROTEIN TOTAL: 7.6 g/dL (ref 6.4–8.9)
SODIUM: 141 mmol/L (ref 136–145)

## 2023-03-25 LAB — CBC WITH DIFF
BASOPHIL #: 0 10*3/uL (ref 0.00–0.10)
BASOPHIL %: 1 % (ref 0–1)
EOSINOPHIL #: 0.2 10*3/uL (ref 0.00–0.50)
EOSINOPHIL %: 5 % (ref 1–7)
HCT: 41.9 % (ref 31.2–41.9)
HGB: 14.3 g/dL (ref 10.9–14.3)
LYMPHOCYTE #: 1.1 10*3/uL (ref 1.00–3.00)
LYMPHOCYTE %: 28 % (ref 16–44)
MCH: 30.4 pg (ref 24.7–32.8)
MCHC: 34.1 g/dL (ref 32.3–35.6)
MCV: 89.3 fL (ref 75.5–95.3)
MONOCYTE #: 0.3 10*3/uL (ref 0.30–1.00)
MONOCYTE %: 8 % (ref 5–13)
MPV: 9.7 fL (ref 7.9–10.8)
NEUTROPHIL #: 2.3 10*3/uL (ref 1.85–7.80)
NEUTROPHIL %: 59 % (ref 43–77)
PLATELETS: 149 10*3/uL (ref 140–440)
RBC: 4.69 10*6/uL (ref 3.63–4.92)
RDW: 14 % (ref 12.3–17.7)
WBC: 3.9 10*3/uL (ref 3.8–11.8)

## 2023-03-25 LAB — THYROID STIMULATING HORMONE WITH FREE T4 REFLEX: TSH: 6.529 u[IU]/mL — ABNORMAL HIGH (ref 0.450–5.330)

## 2023-03-25 LAB — MAGNESIUM: MAGNESIUM: 2 mg/dL (ref 1.9–2.7)

## 2023-03-25 LAB — THYROXINE, FREE (FREE T4): THYROXINE (T4), FREE: 0.74 ng/dL (ref 0.61–1.12)

## 2023-03-25 MED ORDER — MEPERIDINE (PF) 25 MG/ML INJECTION SOLUTION
12.5000 mg | Freq: Once | INTRAMUSCULAR | Status: DC | PRN
Start: 2023-03-25 — End: 2023-03-26

## 2023-03-25 MED ORDER — FAMOTIDINE (PF) 20 MG/2 ML INTRAVENOUS SOLUTION
20.0000 mg | Freq: Once | INTRAVENOUS | Status: DC | PRN
Start: 2023-03-25 — End: 2023-03-26

## 2023-03-25 MED ORDER — DIPHENHYDRAMINE 50 MG/ML INJECTION SOLUTION
50.0000 mg | Freq: Once | INTRAMUSCULAR | Status: DC | PRN
Start: 2023-03-25 — End: 2023-03-26

## 2023-03-25 MED ORDER — ALBUTEROL SULFATE 2.5 MG/3 ML (0.083 %) SOLUTION FOR NEBULIZATION
2.5000 mg | INHALATION_SOLUTION | Freq: Once | RESPIRATORY_TRACT | Status: DC | PRN
Start: 2023-03-25 — End: 2023-03-26

## 2023-03-25 MED ORDER — EPINEPHRINE 1 MG/ML (1 ML) INJECTION SOLUTION
0.3000 mg | Freq: Once | INTRAMUSCULAR | Status: DC | PRN
Start: 2023-03-25 — End: 2023-03-26

## 2023-03-25 MED ORDER — ALBUTEROL SULFATE HFA 90 MCG/ACTUATION AEROSOL INHALER - RN
2.0000 | Freq: Once | RESPIRATORY_TRACT | Status: DC | PRN
Start: 2023-03-25 — End: 2023-03-26

## 2023-03-25 MED ORDER — ONDANSETRON HCL (PF) 4 MG/2 ML INJECTION SOLUTION
8.0000 mg | Freq: Once | INTRAMUSCULAR | Status: DC | PRN
Start: 2023-03-25 — End: 2023-03-26

## 2023-03-25 MED ORDER — DIPHENHYDRAMINE 50 MG/ML INJECTION SOLUTION
25.0000 mg | Freq: Once | INTRAMUSCULAR | Status: DC | PRN
Start: 2023-03-25 — End: 2023-03-26

## 2023-03-25 MED ORDER — FLU VACCINE TS 2024-25(6MOS UP)(PF) 45 MCG(15MCG X3)/0.5 ML IM SYRINGE
INJECTION | INTRAMUSCULAR | Status: AC
Start: 2023-03-25 — End: 2023-03-25
  Filled 2023-03-25: qty 0.5

## 2023-03-25 MED ORDER — ONDANSETRON HCL 8 MG TABLET
8.0000 mg | ORAL_TABLET | Freq: Once | ORAL | Status: DC | PRN
Start: 2023-03-25 — End: 2023-03-26

## 2023-03-25 MED ORDER — SODIUM CHLORIDE 0.9% FLUSH BAG - 250 ML
INTRAVENOUS | Status: DC | PRN
Start: 2023-03-25 — End: 2023-03-26

## 2023-03-25 MED ORDER — SODIUM CHLORIDE 0.9 % INTRAVENOUS SOLUTION
200.0000 mg | Freq: Once | INTRAVENOUS | Status: AC
Start: 2023-03-25 — End: 2023-03-25
  Administered 2023-03-25: 200 mg via INTRAVENOUS
  Administered 2023-03-25: 0 mg via INTRAVENOUS
  Filled 2023-03-25: qty 8

## 2023-03-25 MED ORDER — FLU VACCINE TS 2024-25(6MOS UP)(PF) 45 MCG(15MCG X3)/0.5 ML IM SYRINGE
0.5000 mL | INJECTION | Freq: Once | INTRAMUSCULAR | Status: AC
Start: 2023-03-25 — End: 2023-03-25
  Administered 2023-03-25: 0.5 mL via INTRAMUSCULAR

## 2023-03-25 MED ORDER — LEVOTHYROXINE 150 MCG TABLET
150.0000 ug | ORAL_TABLET | Freq: Every morning | ORAL | 0 refills | Status: AC
Start: 2023-03-25 — End: ?

## 2023-03-25 MED ORDER — HYDROCORTISONE SOD SUCCINATE 100 MG/2 ML VIAL WRAPPER
100.0000 mg | Freq: Once | INTRAMUSCULAR | Status: DC | PRN
Start: 2023-03-25 — End: 2023-03-26

## 2023-03-25 MED ORDER — DEXTROSE 5% IN WATER (D5W) FLUSH BAG - 250 ML
INTRAVENOUS | Status: DC | PRN
Start: 2023-03-25 — End: 2023-03-26

## 2023-03-25 NOTE — Nurses Notes (Signed)
6948 - Patient ambulatory to room for Keytruda infusion. Assessment complete. Lungs clear throughout lung fields. Abdomen soft and non-tender, bowel sounds present. No edema noted. Patient denies pain or discomfort at present time but states she has been having some joint pain in her fingers, elbows, and knees. Carolynn Serve, RN  Patient Assessment/Symptom Management Patient Has No MD Appointment Today   Key: (+) Symptom present           (-)  Symptom not present If Symptom is Positive(+) a Nursing Note is required   Edema -   Uncontrolled Nausea -   Vomiting -   Inability to eat/drink -   Mouth Sores -   Diarrhea -   Constipation (? Last BM) -   Fatigue that interferes with ADL's -   Numbness/Tingling -change -   Other -   Fever/Signs & Symptoms of infection -   Nurse Initials JD   [phone] - Peripheral IV started to right wrist. Patient tolerated well.   7035 - Labs reviewed and are good for treatment. Orders released to pharmacy. Carolynn Serve, RN  0900 - Keytruda infusion started. Carolynn Serve, RN  [phone] - Influenza vaccine given in left arm per protocol. Patient tolerated well. Carolynn Serve, RN  0930 - Keytruda infusion complete. Line flushing. Patient tolerated well. Carolynn Serve, RN  [phone] - Peripheral IV removed. Gauze dressing and Coban applied. Carolynn Serve, RN  [phone] - Patient left unit via ambulation at this time. Carolynn Serve, RN

## 2023-03-30 ENCOUNTER — Encounter (INDEPENDENT_AMBULATORY_CARE_PROVIDER_SITE_OTHER): Payer: Self-pay | Admitting: NURSE PRACTITIONER

## 2023-03-31 ENCOUNTER — Encounter (INDEPENDENT_AMBULATORY_CARE_PROVIDER_SITE_OTHER): Payer: 59 | Admitting: NURSE PRACTITIONER

## 2023-04-02 ENCOUNTER — Encounter (INDEPENDENT_AMBULATORY_CARE_PROVIDER_SITE_OTHER): Payer: Self-pay | Admitting: HEMATOLOGY-ONCOLOGY

## 2023-04-03 ENCOUNTER — Encounter (INDEPENDENT_AMBULATORY_CARE_PROVIDER_SITE_OTHER): Payer: Self-pay

## 2023-04-07 ENCOUNTER — Encounter (INDEPENDENT_AMBULATORY_CARE_PROVIDER_SITE_OTHER): Payer: Self-pay | Admitting: HEMATOLOGY-ONCOLOGY

## 2023-04-07 ENCOUNTER — Encounter (INDEPENDENT_AMBULATORY_CARE_PROVIDER_SITE_OTHER): Payer: Self-pay | Admitting: Surgery

## 2023-04-07 ENCOUNTER — Other Ambulatory Visit: Payer: Self-pay

## 2023-04-07 ENCOUNTER — Ambulatory Visit (INDEPENDENT_AMBULATORY_CARE_PROVIDER_SITE_OTHER): Payer: 59 | Admitting: Surgery

## 2023-04-07 VITALS — BP 147/95 | HR 65 | Temp 97.1°F | Wt 222.0 lb

## 2023-04-07 DIAGNOSIS — K30 Functional dyspepsia: Secondary | ICD-10-CM

## 2023-04-07 DIAGNOSIS — R1013 Epigastric pain: Secondary | ICD-10-CM

## 2023-04-07 DIAGNOSIS — R933 Abnormal findings on diagnostic imaging of other parts of digestive tract: Secondary | ICD-10-CM

## 2023-04-07 DIAGNOSIS — Z85528 Personal history of other malignant neoplasm of kidney: Secondary | ICD-10-CM

## 2023-04-08 ENCOUNTER — Encounter (INDEPENDENT_AMBULATORY_CARE_PROVIDER_SITE_OTHER): Payer: Self-pay | Admitting: NURSE PRACTITIONER

## 2023-04-08 ENCOUNTER — Ambulatory Visit (INDEPENDENT_AMBULATORY_CARE_PROVIDER_SITE_OTHER): Payer: 59 | Admitting: NURSE PRACTITIONER

## 2023-04-08 VITALS — BP 142/78 | HR 59 | Resp 16 | Ht 69.0 in | Wt 221.5 lb

## 2023-04-08 DIAGNOSIS — K219 Gastro-esophageal reflux disease without esophagitis: Secondary | ICD-10-CM

## 2023-04-08 DIAGNOSIS — I10 Essential (primary) hypertension: Secondary | ICD-10-CM

## 2023-04-08 DIAGNOSIS — E039 Hypothyroidism, unspecified: Secondary | ICD-10-CM

## 2023-04-08 DIAGNOSIS — C642 Malignant neoplasm of left kidney, except renal pelvis: Secondary | ICD-10-CM

## 2023-04-08 DIAGNOSIS — H6123 Impacted cerumen, bilateral: Secondary | ICD-10-CM

## 2023-04-08 DIAGNOSIS — E559 Vitamin D deficiency, unspecified: Secondary | ICD-10-CM

## 2023-04-08 NOTE — Assessment & Plan Note (Signed)
Condition stable will continue current therapy with Prilosec.

## 2023-04-08 NOTE — Assessment & Plan Note (Signed)
Condition stable will continue current therapy with vitamin d replacement.

## 2023-04-08 NOTE — Nursing Note (Signed)
Patient here for 4 month follow up.  Patient states that her right ear has felt stopped up for about 1 week now.

## 2023-04-08 NOTE — Progress Notes (Signed)
GENERAL SURGERY, [facility] GENERAL SURGERY  [address]  [place] [address]    History and Physical    Name: Taylor Smith MRN:  [id number]   Date: 04/07/2023 DOB:  04-17-1971 (52 y.o.)              Reason for Visit: EGD    History of Present Illness  Taylor Smith presents today for EGD because of dyspepsia and CT scan findings showing possible thickening of the gastric fundus in his patient who has significant history of left kidney cancer with Mets.    Negative diabetes, blood thinner      Review of the result(s) of each unique test:  Patient underwent diagnostic testing ( as above ) prior to this dates visit.  I have personally reviewed the results and that serves as a component of the medical decision making for this encounter       Review of prior external note(s) from each unique source:  Patients referral to this office including a recent assessment by the referring provider.  This was reviewed by me for this unique office visit for the indication and intent of the referral as well as any pertinent medical or surgical history relevant to the patients independent evaluation by me today.      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)     Kidney cancer, primary, with metastasis from kidney to other site, left (CMS [facility])          Past Surgical History:   Procedure Laterality Date    HX CESAREAN SECTION      HX CHOLECYSTECTOMY      HX CYST REMOVAL Right     HX HYSTERECTOMY      NEPHROURETERECTOMY Left     TOTAL THYROIDECTOMY           Current Outpatient Medications   Medication Sig    ergocalciferol, vitamin D2, (DRISDOL) 1,250 mcg (50,000 unit) Oral Capsule Take 1 Capsule (50,000 Units total) by mouth Every 7 days    hydroCHLOROthiazide (MICROZIDE) 12.5 mg Oral Capsule TAKE 1 CAPSULE (12.5 MG TOTAL) BY MOUTH ONCE A DAY  INDICATIONS: HIGH BLOOD PRESSURE    Ibuprofen (MOTRIN) 600 mg Oral Tablet Take 1 Tablet (600 mg total) by mouth Every 6 hours as needed    levothyroxine (SYNTHROID) 150 mcg Oral Tablet Take 1 Tablet (150 mcg total) by mouth Every morning    omeprazole (PRILOSEC) 40 mg Oral Capsule, Delayed Release(E.C.) Take 1 Capsule (40 mg total) by mouth Once a day    pembrolizumab (KEYTRUDA) 25 mg/mL Intravenous Solution Infuse 8 mL (200 mg total) into a venous catheter Every 21 days    predniSONE (DELTASONE) 10 mg Oral Tablet Take 1 Tablet (10 mg total) by mouth Once a day    valsartan (DIOVAN) 40 mg Oral Tablet Take 2 Tablets (80 mg total) by mouth Twice daily     Allergies   Allergen Reactions    Aldactone [Spironolactone] Itching     Family Medical History:       Problem Relation (Age of Onset)    Cancer Maternal Aunt, Maternal Grandmother    Colon Cancer Maternal Uncle    Lung Cancer Mother    Melanoma Maternal Aunt  Social History     Tobacco Use    Smoking status: Never    Smokeless tobacco: Never   Vaping Use    Vaping status: Never Used   Substance Use Topics    Alcohol use: Never    Drug use: Never            Physical Examination:  Vitals:    04/07/23 1528   BP: (!) 147/95   Pulse: 65   Temp: 36.2 C (97.1 F)   SpO2: 99%   Weight: 101 kg (222 lb)        General: appropriate for age. in no acute distress.    Vital signs are present above and have been reviewed by me     HEENT: Atraumatic, Normocephalic. PERRLA. EOMI. Nose clear. Throat clear    Lungs: Nonlabored breathing with symmetric expansion. Clear to auscultation bilaterally    Heart:Regular wth respect to rate and rythmn.    Abdomen:Soft. Nontender. Nondistended and otherwise benign    Extremities: Grossly normal. No major deformities     Neuro:  Grossly normal motor and sensory function    Psychiatric: Alert and oriented to person, place, and time. affect appropriate      Assessment and Plan  EGD with biopsy scheduled for 05/06/2023 at 9:30 a.m.      Discussed indications, risks, and benefits of EGD with biopsy with the patient.  Discussed the possibility of polypectomy, biopsies, and possible repeat examinations.  Risks include bleeding, sedation risks, possibility of missed diagnosis of polyp or malignancy, and remote possibilities of perforation and death.  All questions were answered, and informed consent was clearly obtained.      Follow Up:  No follow-ups on file.      ICD-10-CM    1. Dyspepsia  R10.13       2. Abnormal findings on diagnostic imaging of digestive system  R93.3           Taylor Smith Taylor Wolanski, MD ,MBA,FACS    I appreciate the opportunity to be involved in the care of your patients.  If you have any questions or concerns regarding this encounter, please do not hesitate to contact me at your convenience.      This note may have been partially generated using M[employer] Fluency Direct system, and there may be some incorrect words, spellings, and punctuation that were not noted in checking the note before saving, though effort was made to avoid such errors.

## 2023-04-08 NOTE — Progress Notes (Signed)
INTERNAL MEDICINE, [facility]  [address]  [place] [address]       Name: Taylor Smith MRN:  [id number]   Date: 04/08/2023 Age: 52 y.o.       Chief Complaint:    Chief Complaint   Patient presents with    Follow Up 4 Months     4 month follow up        HPI:  Taylor Smith is a 52 y.o. female who is here today for routine f/u, chronic disease management.     She states that overall she is doing well. She is compliant with taking all medications.     Patient continues to follow routinely with oncology.     Blood pressure is slightly elevated in the office, but has been running within normal limits when she checks at home.   She denies any chest pain/pressure, shortness of breath, headaches or vision changes.     She is also complaining of feeling of bilateral ear fullness and decreased hearing.     10+ROS performed and is negative except as mentioned above.     She denies any other problems or complaints.         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 to other site, left (CMS [facility])          Past Surgical History:   Procedure Laterality Date    HX CESAREAN SECTION      HX CHOLECYSTECTOMY      HX CYST REMOVAL Right     HX HYSTERECTOMY      NEPHROURETERECTOMY Left     TOTAL THYROIDECTOMY        Current Outpatient Medications   Medication Sig    ergocalciferol, vitamin D2, (DRISDOL) 1,250 mcg (50,000 unit) Oral Capsule Take 1 Capsule (50,000 Units total) by mouth Every 7 days    Ibuprofen (MOTRIN) 600 mg Oral Tablet Take 1 Tablet (600 mg total) by mouth Every 6 hours as needed    levothyroxine (SYNTHROID) 150 mcg Oral Tablet Take 1 Tablet (150 mcg total) by mouth Every morning    omeprazole (PRILOSEC) 40 mg Oral Capsule, Delayed Release(E.C.) Take 1  Capsule (40 mg total) by mouth Once a day    pembrolizumab (KEYTRUDA) 25 mg/mL Intravenous Solution Infuse 8 mL (200 mg total) into a venous catheter Every 21 days    valsartan (DIOVAN) 40 mg Oral Tablet Take 2 Tablets (80 mg total) by mouth Twice daily (Patient taking differently: Take 1 Tablet (40 mg total) by mouth Once a day)     Allergies   Allergen Reactions    Aldactone [Spironolactone] Itching       Family History:  Family Medical History:       Problem Relation (Age of Onset)    Cancer Maternal Aunt, Maternal Grandmother    Colon Cancer Maternal Uncle    Lung Cancer Mother    Melanoma Maternal Aunt              Social History:   Social History     Tobacco Use   Smoking Status Never   Smokeless Tobacco Never     Social History     Substance and Sexual Activity  Alcohol Use Never     Social History     Occupational History    Not on file       Review of Systems:  Review of systems as discussed in HPI    Problem List:  Patient Active Problem List   Diagnosis    Vitamin D deficiency    Hypothyroid    GERD (gastroesophageal reflux disease)    Primary hypertension    Neural foraminal stenosis of cervical spine    Osteophyte of cervical spine    S/P hysterectomy    Kidney cancer, primary, with metastasis from kidney to other site, left (CMS [facility])       Physical Examination:  BP (!) 142/78 (Site: Left Arm, Patient Position: Sitting, Cuff Size: Adult)   Pulse 59   Resp 16   Ht 1.753 m (5\' 9" )   Wt 100 kg (221 lb 8 oz)   SpO2 100%   BMI 32.71 kg/m       Physical Exam  Vitals and nursing note reviewed.   Constitutional:       General: She is not in acute distress.     Appearance: Normal appearance. She is well-developed, well-groomed and overweight. She is not ill-appearing or diaphoretic.   HENT:      Head: Normocephalic and atraumatic.      Right Ear: There is impacted cerumen.      Left Ear: There is impacted cerumen.   Eyes:      Pupils: Pupils are equal, round, and reactive to light.   Cardiovascular:       Rate and Rhythm: Normal rate.      Pulses: Normal pulses.           Radial pulses are 2+ on the right side and 2+ on the left side.        Popliteal pulses are 2+ on the right side and 2+ on the left side.      Heart sounds: Normal heart sounds, S1 normal and S2 normal.   Pulmonary:      Effort: Pulmonary effort is normal.      Breath sounds: Normal breath sounds.   Abdominal:      General: Abdomen is flat. Bowel sounds are normal.      Palpations: Abdomen is soft.   Musculoskeletal:      Cervical back: Normal range of motion.      Right lower leg: No edema.      Left lower leg: No edema.   Skin:     General: Skin is warm and dry.      Capillary Refill: Capillary refill takes less than 2 seconds.   Neurological:      General: No focal deficit present.      Mental Status: She is alert and oriented to person, place, and time.   Psychiatric:         Mood and Affect: Mood normal.         Behavior: Behavior is cooperative.        Data Reviewed:  Hospital Outpatient Visit on 03/25/2023   Component Date Value Ref Range Status    SODIUM 03/25/2023 141  136 - 145 mmol/L Final    POTASSIUM 03/25/2023 3.9  3.5 - 5.1 mmol/L Final    CHLORIDE 03/25/2023 105  98 - 107 mmol/L Final    CO2 TOTAL 03/25/2023 22  21 - 31 mmol/L Final    ANION GAP 03/25/2023 14 (H)  4 - 13 mmol/L Final  BUN 03/25/2023 13  7 - 25 mg/dL Final    CREATININE 16/01/9603 1.07  0.60 - 1.30 mg/dL Final    BUN/CREA RATIO 03/25/2023 12  6 - 22 Final    ESTIMATED GFR 03/25/2023 62  >59 mL/min/1.46m^2 Final    ALBUMIN 03/25/2023 4.3  3.5 - 5.7 g/dL Final    CALCIUM 54/12/8117 9.6  8.6 - 10.3 mg/dL Final    GLUCOSE 14/78/2956 136 (H)  74 - 109 mg/dL Final    ALKALINE PHOSPHATASE 03/25/2023 74  34 - 104 U/L Final    ALT (SGPT) 03/25/2023 27  7 - 52 U/L Final    AST (SGOT) 03/25/2023 28  13 - 39 U/L Final    BILIRUBIN TOTAL 03/25/2023 0.8  0.3 - 1.0 mg/dL Final    PROTEIN TOTAL 03/25/2023 7.6  6.4 - 8.9 g/dL Final    ALBUMIN/GLOBULIN RATIO 03/25/2023 1.3  0.8 - 1.4  Final    OSMOLALITY, CALCULATED 03/25/2023 284  270 - 290 mOsm/kg Final    CALCIUM, CORRECTED 03/25/2023 9.4  8.9 - 10.8 mg/dL Final    GLOBULIN 21/30/8657 3.3  2.0 - 3.5 Final    MAGNESIUM 03/25/2023 2.0  1.9 - 2.7 mg/dL Final    TSH 84/69/6295 6.529 (H)  0.450 - 5.330 uIU/mL Final    WBC 03/25/2023 3.9  3.8 - 11.8 x10^3/uL Final    RBC 03/25/2023 4.69  3.63 - 4.92 x10^6/uL Final    HGB 03/25/2023 14.3  10.9 - 14.3 g/dL Final    HCT 28/41/3244 41.9  31.2 - 41.9 % Final    MCV 03/25/2023 89.3  75.5 - 95.3 fL Final    MCH 03/25/2023 30.4  24.7 - 32.8 pg Final    MCHC 03/25/2023 34.1  32.3 - 35.6 g/dL Final    RDW 04/23/7251 14.0  12.3 - 17.7 % Final    PLATELETS 03/25/2023 149  140 - 440 x10^3/uL Final    MPV 03/25/2023 9.7  7.9 - 10.8 fL Final    NEUTROPHIL % 03/25/2023 59  43 - 77 % Final    LYMPHOCYTE % 03/25/2023 28  16 - 44 % Final    MONOCYTE % 03/25/2023 8  5 - 13 % Final    EOSINOPHIL % 03/25/2023 5  1 - 7 % Final    BASOPHIL % 03/25/2023 1  0 - 1 % Final    NEUTROPHIL # 03/25/2023 2.30  1.85 - 7.80 x10^3/uL Final    LYMPHOCYTE # 03/25/2023 1.10  1.00 - 3.00 x10^3/uL Final    MONOCYTE # 03/25/2023 0.30  0.30 - 1.00 x10^3/uL Final    EOSINOPHIL # 03/25/2023 0.20  0.00 - 0.50 x10^3/uL Final    BASOPHIL # 03/25/2023 0.00  0.00 - 0.10 x10^3/uL Final    THYROXINE (T4), FREE 03/25/2023 0.74  0.61 - 1.12 ng/dL Final   Hospital Outpatient Visit on 03/04/2023   Component Date Value Ref Range Status    SIGNATERA TEST RESULT 03/04/2023 NEGATIVE   Final    SIGNATERA MTM READOUT 03/04/2023 0  MTM/ml Final    Comment: Limitations  Signatera is a personalized, tumor-informed test for the longitudinal detection of circulating tumor DNA (ctDNA). Interval testing is recommended for all patients. Studies have demonstrated that when ctDNA is detected (Signatera Positive) following surgery or definitive treatment, the risk for disease relapse is high without further treatment. Conversely, when ctDNA is not detected, the patient  may be considered at lower risk for relapse. For those with multiple timepoints, upward trending ctDNA  levels are suggestive of increasing tumor burden (1,2). For a single time point in isolation, the absolute MTM/mL value has no known clinical significance and should not be compared across patients. Test results should be interpreted in context of other clinicopathological features. ctDNA detection sensitivity may be limited due to blood collection within two weeks of surgery and while the patient is on therapy. Signatera is a quantitative test and reports in units of mean tumor                            molecules per   ml (MTM/mL), which is comprised of three measured components (plasma volume, cell free DNA (cfDNA) concentration, and Variant Allele Frequency (VAF)). The MTM/mL number will be qualified if any measured component falls outside the analytical measurement range for that component. The analytical sensitivity is 95% at the limit of detection (0.3 MTM/mL). Results obtained are specific to the assessed time point. A negative test result does not definitively indicate the absence of cancer. This test is not designed to detect or report germline variation, nor does it infer hereditary cancer risk for the patient. Each Signatera assay is designed to a single tumor for a given patient. At this time, multiple personalized Signatera assays cannot be developed for the same patient. This test is designed to detect ctDNA from the assayed tumor only; new primary tumors will not be detected. There is a low risk that a new primary may share a variant that could interfere with the                            Signatera test.   Testing cannot be performed in patients who are pregnant, have a history of bone marrow transplant, or history of blood transfusion within three months. This test is expected to have limited sensitivity in cancer types such as GIST, renal cell carcinomas, primary brain tumors, and lymphoma due to  limited ctDNA shed. 1 Bratman SV, [place] Senate, Iafolla MAJ, et al. Personalized circulating tumor DNA analysis as a predictive biomarker in solid tumor patients treated with pembrolizumab. Nature Cancer. 2020;1(9):873-881. 2 Henriksen TV, Veronia Beets, et al., Circulating Tumor DNA in Stage III Colorectal Cancer, beyond Minimal Residual Disease Detection, toward Assessment of Adjuvant Therapy Efficacy and Clinical Behavior of Recurrences. Clin Cancer Res. 2021; 28(3):507-517.  Methodology  FFPE samples are assessed by a pathologist to identify tumor margins and percent tumor content. Tumor DNA is extracted using Qiagen AllPrep. Whole genomic DNA is isolated from peripheral blood                            using QIAamp DNA Blood Mini Kit to provide a baseline DNA sequence. Circulating tumor DNA (ctDNA) is extracted from plasma derived from whole blood samples collected in cell-free DNA blood tubes (Streck) using the QIAsymphony automated or manual extraction method (Qiagen). Using a proprietary algorithm, putative, clonal variants present in the tumor but absent in the germline DNA are identified to design the customized multiplex PCR assay. Whole-exome sequencing is performed on tumor and peripheral blood DNA using KAPA HyperPrep Warden/ranger (Roche) with a custom xGen exome capture (IDT). Using a proprietary algorithm, putative clonal variants present in the tumor but absent in the germline DNA are identified to design the customized multiplex PCR assay. Circulating tumor DNA is extracted from   plasma collected in [facility]  tubes using [employer] proprietary methods. The customized PCR assays are run to detect presence or absence of these variants within circulating plasma. A patient's                            plasma sample is considered ctDNA positive when at least two individual-specific tumor variants are detected. When fewer than two individual-specific tumor variants are observed, a negative result is issued. Tumor  variation outside of the individual, tumor specific variants is not assessed. Pathology services and whole exome sequencing are performed at [facility] (CLIA ID# [id number]) [address], [place], [place] [address].  Disclaimer  The extraction, Designer, television/film set, and sequencing for this test were performed by [facility] [address], [place], [place] [address] (CLIA [place] [id number]). The data analysis and reporting for this test were performed by [facility] [address], [place], [place] [address] (CLIA [place] [id number]). This test was developed and its performance characteristics determined by [facility]. The test has not been cleared or approved by the U.S. Food and Drug Administration (FDA). CAP accredited, ISO 13485 certified, and CLIA certified.                            Pathology services and whole exome sequencing for this test were performed by [facility], [address], [place] [address], CLIA # [id number].  2021 [employer] All Rights Reserved.    SODIUM 03/04/2023 141  136 - 145 mmol/L Final    POTASSIUM 03/04/2023 4.0  3.5 - 5.1 mmol/L Final    CHLORIDE 03/04/2023 105  98 - 107 mmol/L Final    CO2 TOTAL 03/04/2023 27  21 - 31 mmol/L Final    ANION GAP 03/04/2023 9  4 - 13 mmol/L Final    BUN 03/04/2023 9  7 - 25 mg/dL Final    CREATININE 33/29/5188 1.06  0.60 - 1.30 mg/dL Final    BUN/CREA RATIO 03/04/2023 8  6 - 22 Final    ESTIMATED GFR 03/04/2023 63  >59 mL/min/1.88m^2 Final    ALBUMIN 03/04/2023 4.5  3.5 - 5.7 g/dL Final    CALCIUM 41/66/0630 9.9  8.6 - 10.3 mg/dL Final    GLUCOSE 16/04/930 75  74 - 109 mg/dL Final    ALKALINE PHOSPHATASE 03/04/2023 72  34 - 104 U/L Final    ALT (SGPT) 03/04/2023 23  7 - 52 U/L Final    AST (SGOT) 03/04/2023 23  13 - 39 U/L Final    BILIRUBIN TOTAL 03/04/2023 0.9  0.3 - 1.0 mg/dL Final    PROTEIN TOTAL 03/04/2023 7.8  6.4 - 8.9 g/dL Final    ALBUMIN/GLOBULIN RATIO 03/04/2023 1.4  0.8 - 1.4 Final    OSMOLALITY, CALCULATED 03/04/2023 279  270 -  290 mOsm/kg Final    CALCIUM, CORRECTED 03/04/2023 9.5  8.9 - 10.8 mg/dL Final    GLOBULIN 35/57/3220 3.3  2.0 - 3.5 Final    MAGNESIUM 03/04/2023 2.1  1.9 - 2.7 mg/dL Final    TSH 25/42/7062 5.092  0.450 - 5.330 uIU/mL Final    WBC 03/04/2023 4.5  3.8 - 11.8 x10^3/uL Final    RBC 03/04/2023 4.91  3.63 - 4.92 x10^6/uL Final    HGB 03/04/2023 15.2 (H)  10.9 - 14.3 g/dL Final    HCT 37/62/8315 43.6 (H)  31.2 - 41.9 % Final    MCV  03/04/2023 88.8  75.5 - 95.3 fL Final    MCH 03/04/2023 30.9  24.7 - 32.8 pg Final    MCHC 03/04/2023 34.8  32.3 - 35.6 g/dL Final    RDW 42/59/5638 13.5  12.3 - 17.7 % Final    PLATELETS 03/04/2023 191  140 - 440 x10^3/uL Final    MPV 03/04/2023 9.6  7.9 - 10.8 fL Final    NEUTROPHIL % 03/04/2023 61  43 - 77 % Final    LYMPHOCYTE % 03/04/2023 24  16 - 44 % Final    MONOCYTE % 03/04/2023 10  5 - 13 % Final    EOSINOPHIL % 03/04/2023 5  1 - 7 % Final    BASOPHIL % 03/04/2023 0  0 - 1 % Final    NEUTROPHIL # 03/04/2023 2.80  1.85 - 7.80 x10^3/uL Final    LYMPHOCYTE # 03/04/2023 1.10  1.00 - 3.00 x10^3/uL Final    MONOCYTE # 03/04/2023 0.40  0.30 - 1.00 x10^3/uL Final    EOSINOPHIL # 03/04/2023 0.20  0.00 - 0.50 x10^3/uL Final    BASOPHIL # 03/04/2023 0.00  0.00 - 0.10 x10^3/uL Final    THYROXINE (T4), FREE 03/04/2023 1.08  0.61 - 1.12 ng/dL Final   Office Visit on 02/24/2023   Component Date Value Ref Range Status    SARS-CoV-2 02/24/2023 Not Detected  Not Detected Final    INFLUENZA VIRUS TYPE A 02/24/2023 Not Detected  Not Detected Final    INFLUENZA VIRUS TYPE B 02/24/2023 Not Detected  Not Detected Final    RESPIRATORY SYNCTIAL VIRUS (RSV) 02/24/2023 Not Detected  Not Detected Final    THROAT RAPID SCREEN, STREPTOCOCCUS 02/24/2023 Negative  Negative Final    THROAT CULTURE 02/24/2023 Normal [facility] Outpatient Visit on 02/11/2023   Component Date Value Ref Range Status    SODIUM 02/11/2023 139  136 - 145 mmol/L Final    POTASSIUM 02/11/2023 4.0  3.5 - 5.1 mmol/L  Final    CHLORIDE 02/11/2023 108 (H)  98 - 107 mmol/L Final    CO2 TOTAL 02/11/2023 27  21 - 31 mmol/L Final    ANION GAP 02/11/2023 4  4 - 13 mmol/L Final    BUN 02/11/2023 8  7 - 25 mg/dL Final    CREATININE 75/64/3329 1.09  0.60 - 1.30 mg/dL Final    BUN/CREA RATIO 02/11/2023 7  6 - 22 Final    ESTIMATED GFR 02/11/2023 61  >59 mL/min/1.41m^2 Final    ALBUMIN 02/11/2023 4.4  3.5 - 5.7 g/dL Final    CALCIUM 51/88/4166 9.6  8.6 - 10.3 mg/dL Final    GLUCOSE 10/19/1599 88  74 - 109 mg/dL Final    ALKALINE PHOSPHATASE 02/11/2023 69  34 - 104 U/L Final    ALT (SGPT) 02/11/2023 28  7 - 52 U/L Final    AST (SGOT) 02/11/2023 26  13 - 39 U/L Final    BILIRUBIN TOTAL 02/11/2023 0.6  0.3 - 1.0 mg/dL Final    PROTEIN TOTAL 02/11/2023 7.8  6.4 - 8.9 g/dL Final    ALBUMIN/GLOBULIN RATIO 02/11/2023 1.3  0.8 - 1.4 Final    OSMOLALITY, CALCULATED 02/11/2023 275  270 - 290 mOsm/kg Final    CALCIUM, CORRECTED 02/11/2023 9.3  8.9 - 10.8 mg/dL Final    GLOBULIN 09/32/3557 3.4  2.9 - 5.4 Final    MAGNESIUM 02/11/2023 1.8 (L)  1.9 - 2.7 mg/dL Final    TSH 32/20/2542 5.035  0.450 -  5.330 uIU/mL Final    WBC 02/11/2023 3.9  3.8 - 11.8 x10^3/uL Final    RBC 02/11/2023 4.65  3.63 - 4.92 x10^6/uL Final    HGB 02/11/2023 13.9  10.9 - 14.3 g/dL Final    HCT 16/01/9603 41.1  31.2 - 41.9 % Final    MCV 02/11/2023 88.4  75.5 - 95.3 fL Final    MCH 02/11/2023 29.9  24.7 - 32.8 pg Final    MCHC 02/11/2023 33.8  32.3 - 35.6 g/dL Final    RDW 54/12/8117 13.8  12.3 - 17.7 % Final    PLATELETS 02/11/2023 183  140 - 440 x10^3/uL Final    MPV 02/11/2023 9.1  7.9 - 10.8 fL Final    NEUTROPHIL % 02/11/2023 66  43 - 77 % Final    LYMPHOCYTE % 02/11/2023 20  16 - 44 % Final    MONOCYTE % 02/11/2023 9  5 - 13 % Final    EOSINOPHIL % 02/11/2023 3  1 - 7 % Final    BASOPHIL % 02/11/2023 1  0 - 1 % Final    NEUTROPHIL # 02/11/2023 2.60  1.85 - 7.80 x10^3/uL Final    LYMPHOCYTE # 02/11/2023 0.80 (L)  1.00 - 3.00 x10^3/uL Final    MONOCYTE # 02/11/2023 0.40  0.30  - 1.00 x10^3/uL Final    EOSINOPHIL # 02/11/2023 0.10  0.00 - 0.50 x10^3/uL Final    BASOPHIL # 02/11/2023 0.00  0.00 - 0.10 x10^3/uL Final    THYROXINE (T4), FREE 02/11/2023 0.93  0.58 - 1.64 ng/dL Final   Office Visit on 02/02/2023   Component Date Value Ref Range Status    SARS-CoV-2 02/02/2023 Not Detected  Not Detected Final    INFLUENZA VIRUS TYPE A 02/02/2023 Not Detected  Not Detected Final    INFLUENZA VIRUS TYPE B 02/02/2023 Not Detected  Not Detected Final    RESPIRATORY SYNCTIAL VIRUS (RSV) 02/02/2023 Not Detected  Not Detected Final    THROAT RAPID SCREEN, STREPTOCOCCUS 02/02/2023 Negative  Negative Final    THROAT CULTURE 02/02/2023 Normal [facility] Outpatient Visit on 01/21/2023   Component Date Value Ref Range Status    SODIUM 01/21/2023 140  136 - 145 mmol/L Final    POTASSIUM 01/21/2023 3.8  3.5 - 5.1 mmol/L Final    CHLORIDE 01/21/2023 107  98 - 107 mmol/L Final    CO2 TOTAL 01/21/2023 28  21 - 31 mmol/L Final    ANION GAP 01/21/2023 5  4 - 13 mmol/L Final    BUN 01/21/2023 10  7 - 25 mg/dL Final    CREATININE 14/78/2956 1.17  0.60 - 1.30 mg/dL Final    BUN/CREA RATIO 01/21/2023 9  6 - 22 Final    ESTIMATED GFR 01/21/2023 56 (L)  >59 mL/min/1.65m^2 Final    ALBUMIN 01/21/2023 4.5  3.5 - 5.7 g/dL Final    CALCIUM 21/30/8657 9.3  8.6 - 10.3 mg/dL Final    GLUCOSE 84/69/6295 140 (H)  74 - 109 mg/dL Final    ALKALINE PHOSPHATASE 01/21/2023 73  34 - 104 U/L Final    ALT (SGPT) 01/21/2023 31  7 - 52 U/L Final    AST (SGOT) 01/21/2023 31  13 - 39 U/L Final    BILIRUBIN TOTAL 01/21/2023 0.8  0.3 - 1.0 mg/dL Final    PROTEIN TOTAL 01/21/2023 7.2  6.4 - 8.9 g/dL Final    ALBUMIN/GLOBULIN RATIO 01/21/2023 1.7 (H)  0.8 - 1.4 Final  OSMOLALITY, CALCULATED 01/21/2023 281  270 - 290 mOsm/kg Final    CALCIUM, CORRECTED 01/21/2023 8.9  8.9 - 10.8 mg/dL Final    GLOBULIN 28/41/3244 2.7 (L)  2.9 - 5.4 Final    MAGNESIUM 01/21/2023 1.8 (L)  1.9 - 2.7 mg/dL Final    TSH 04/23/7251 3.654   0.450 - 5.330 uIU/mL Final    WBC 01/21/2023 3.7 (L)  3.8 - 11.8 x10^3/uL Final    RBC 01/21/2023 4.68  3.63 - 4.92 x10^6/uL Final    HGB 01/21/2023 14.2  10.9 - 14.3 g/dL Final    HCT 66/44/0347 41.4  31.2 - 41.9 % Final    MCV 01/21/2023 88.4  75.5 - 95.3 fL Final    MCH 01/21/2023 30.4  24.7 - 32.8 pg Final    MCHC 01/21/2023 34.4  32.3 - 35.6 g/dL Final    RDW 42/59/5638 14.0  12.3 - 17.7 % Final    PLATELETS 01/21/2023 144  140 - 440 x10^3/uL Final    MPV 01/21/2023 10.2  7.9 - 10.8 fL Final    NEUTROPHIL % 01/21/2023 58  43 - 77 % Final    LYMPHOCYTE % 01/21/2023 30  16 - 44 % Final    MONOCYTE % 01/21/2023 7  5 - 13 % Final    EOSINOPHIL % 01/21/2023 5  1 - 7 % Final    BASOPHIL % 01/21/2023 1  0 - 1 % Final    NEUTROPHIL # 01/21/2023 2.20  1.85 - 7.80 x10^3/uL Final    LYMPHOCYTE # 01/21/2023 1.10  1.00 - 3.00 x10^3/uL Final    MONOCYTE # 01/21/2023 0.20 (L)  0.30 - 1.00 x10^3/uL Final    EOSINOPHIL # 01/21/2023 0.20  0.00 - 0.50 x10^3/uL Final    BASOPHIL # 01/21/2023 0.00  0.00 - 0.10 x10^3/uL Final   Hospital Outpatient Visit on 12/31/2022   Component Date Value Ref Range Status    SODIUM 12/31/2022 137  136 - 145 mmol/L Final    POTASSIUM 12/31/2022 3.6  3.5 - 5.1 mmol/L Final    CHLORIDE 12/31/2022 109 (H)  98 - 107 mmol/L Final    CO2 TOTAL 12/31/2022 32 (H)  21 - 31 mmol/L Final    BUN 12/31/2022 10  7 - 25 mg/dL Final    CREATININE 75/64/3329 1.05  0.60 - 1.30 mg/dL Final    BUN/CREA RATIO 12/31/2022 10  6 - 22 Final    ESTIMATED GFR 12/31/2022 64  >59 mL/min/1.80m^2 Final    ALBUMIN 12/31/2022 4.3  3.5 - 5.7 g/dL Final    CALCIUM 51/88/4166 9.5  8.6 - 10.3 mg/dL Final    GLUCOSE 10/19/1599 112 (H)  74 - 109 mg/dL Final    ALKALINE PHOSPHATASE 12/31/2022 65  34 - 104 U/L Final    ALT (SGPT) 12/31/2022 37  7 - 52 U/L Final    AST (SGOT) 12/31/2022 33  13 - 39 U/L Final    BILIRUBIN TOTAL 12/31/2022 1.0  0.3 - 1.0 mg/dL Final    PROTEIN TOTAL 12/31/2022 7.3  6.4 - 8.9 g/dL Final    ALBUMIN/GLOBULIN  RATIO 12/31/2022 1.4  0.8 - 1.4 Final    OSMOLALITY, CALCULATED 12/31/2022 274  270 - 290 mOsm/kg Final    CALCIUM, CORRECTED 12/31/2022 9.3  8.9 - 10.8 mg/dL Final    GLOBULIN 09/32/3557 3.0  2.9 - 5.4 Final    MAGNESIUM 12/31/2022 1.8 (L)  1.9 - 2.7 mg/dL Final    TSH 32/20/2542 8.551 (H)  0.450 -  5.330 uIU/mL Final    WBC 12/31/2022 3.5 (L)  3.8 - 11.8 x10^3/uL Final    RBC 12/31/2022 4.73  3.63 - 4.92 x10^6/uL Final    HGB 12/31/2022 14.2  10.9 - 14.3 g/dL Final    HCT 35/00/9381 41.9  31.2 - 41.9 % Final    MCV 12/31/2022 88.6  75.5 - 95.3 fL Final    MCH 12/31/2022 30.1  24.7 - 32.8 pg Final    MCHC 12/31/2022 34.0  32.3 - 35.6 g/dL Final    RDW 82/99/3716 13.4  12.3 - 17.7 % Final    PLATELETS 12/31/2022 146  140 - 440 x10^3/uL Final    MPV 12/31/2022 10.0  7.9 - 10.8 fL Final    NEUTROPHIL % 12/31/2022 58  43 - 77 % Final    LYMPHOCYTE % 12/31/2022 28  16 - 44 % Final    MONOCYTE % 12/31/2022 8  5 - 13 % Final    EOSINOPHIL % 12/31/2022 5  1 - 7 % Final    BASOPHIL % 12/31/2022 1  0 - 1 % Final    NEUTROPHIL # 12/31/2022 2.00  1.85 - 7.80 x10^3/uL Final    LYMPHOCYTE # 12/31/2022 1.00  1.00 - 3.00 x10^3/uL Final    MONOCYTE # 12/31/2022 0.30  0.30 - 1.00 x10^3/uL Final    EOSINOPHIL # 12/31/2022 0.20  0.00 - 0.50 x10^3/uL Final    BASOPHIL # 12/31/2022 0.00  0.00 - 0.10 x10^3/uL Final    THYROXINE (T4), FREE 12/31/2022 0.70  0.58 - 1.64 ng/dL Final   Hospital Outpatient Visit on 12/10/2022   Component Date Value Ref Range Status    SODIUM 12/10/2022 141  136 - 145 mmol/L Final    POTASSIUM 12/10/2022 3.7  3.5 - 5.1 mmol/L Final    CHLORIDE 12/10/2022 106  98 - 107 mmol/L Final    CO2 TOTAL 12/10/2022 26  21 - 31 mmol/L Final    ANION GAP 12/10/2022 9  4 - 13 mmol/L Final    BUN 12/10/2022 9  7 - 25 mg/dL Final    CREATININE 96/78/9381 1.10  0.60 - 1.30 mg/dL Final    BUN/CREA RATIO 12/10/2022 8  6 - 22 Final    ESTIMATED GFR 12/10/2022 60  >59 mL/min/1.6m^2 Final    ALBUMIN 12/10/2022 4.4  3.5 - 5.7 g/dL  Final    CALCIUM 01/75/1025 9.8  8.6 - 10.3 mg/dL Final    GLUCOSE 85/27/7824 104  74 - 109 mg/dL Final    ALKALINE PHOSPHATASE 12/10/2022 62  34 - 104 U/L Final    ALT (SGPT) 12/10/2022 39  7 - 52 U/L Final    AST (SGOT) 12/10/2022 33  13 - 39 U/L Final    BILIRUBIN TOTAL 12/10/2022 0.8  0.3 - 1.0 mg/dL Final    PROTEIN TOTAL 12/10/2022 7.4  6.4 - 8.9 g/dL Final    ALBUMIN/GLOBULIN RATIO 12/10/2022 1.5 (H)  0.8 - 1.4 Final    OSMOLALITY, CALCULATED 12/10/2022 280  270 - 290 mOsm/kg Final    CALCIUM, CORRECTED 12/10/2022 9.5  8.9 - 10.8 mg/dL Final    GLOBULIN 23/53/6144 3.0  2.9 - 5.4 Final    MAGNESIUM 12/10/2022 1.9  1.9 - 2.7 mg/dL Final    TSH 31/54/0086 1.107  0.450 - 5.330 uIU/mL Final    WBC 12/10/2022 3.6 (L)  3.8 - 11.8 x10^3/uL Final    RBC 12/10/2022 4.89  3.63 - 4.92 x10^6/uL Final    HGB 12/10/2022 14.9 (H)  10.9 - 14.3 g/dL Final    HCT 29/52/8413 43.5 (H)  31.2 - 41.9 % Final    MCV 12/10/2022 88.9  75.5 - 95.3 fL Final    MCH 12/10/2022 30.5  24.7 - 32.8 pg Final    MCHC 12/10/2022 34.3  32.3 - 35.6 g/dL Final    RDW 24/40/1027 12.8  12.3 - 17.7 % Final    PLATELETS 12/10/2022 152  140 - 440 x10^3/uL Final    MPV 12/10/2022 10.2  7.9 - 10.8 fL Final    NEUTROPHIL % 12/10/2022 57  43 - 77 % Final    LYMPHOCYTE % 12/10/2022 28  16 - 44 % Final    MONOCYTE % 12/10/2022 9  5 - 13 % Final    EOSINOPHIL % 12/10/2022 5  1 - 7 % Final    BASOPHIL % 12/10/2022 1  0 - 1 % Final    NEUTROPHIL # 12/10/2022 2.10  1.85 - 7.80 x10^3/uL Final    LYMPHOCYTE # 12/10/2022 1.00  1.00 - 3.00 x10^3/uL Final    MONOCYTE # 12/10/2022 0.30  0.30 - 1.00 x10^3/uL Final    EOSINOPHIL # 12/10/2022 0.20  0.00 - 0.50 x10^3/uL Final    BASOPHIL # 12/10/2022 0.00  0.00 - 0.10 x10^3/uL Final    SIGNATERA TEST RESULT 12/10/2022 NEGATIVE   Final    SIGNATERA MTM READOUT 12/10/2022 0  MTM/ml Final    Comment: Limitations  Signatera is a personalized, tumor-informed test for the longitudinal detection of circulating tumor DNA (ctDNA).  Interval testing is recommended for all patients. Studies have demonstrated that when ctDNA is detected (Signatera Positive) following surgery or definitive treatment, the risk for disease relapse is high without further treatment. Conversely, when ctDNA is not detected, the patient may be considered at lower risk for relapse. For those with multiple timepoints, upward trending ctDNA levels are suggestive of increasing tumor burden (1,2). For a single time point in isolation, the absolute MTM/mL value has no known clinical significance and should not be compared across patients. Test results should be interpreted in context of other clinicopathological features. ctDNA detection sensitivity may be limited due to blood collection within two weeks of surgery and while the patient is on therapy. Signatera is a quantitative test and reports in units of mean tumor                            molecules per   ml (MTM/mL), which is comprised of three measured components (plasma volume, cell free DNA (cfDNA) concentration, and Variant Allele Frequency (VAF)). The MTM/mL number will be qualified if any measured component falls outside the analytical measurement range for that component. The analytical sensitivity is 95% at the limit of detection (0.3 MTM/mL). Results obtained are specific to the assessed time point. A negative test result does not definitively indicate the absence of cancer. This test is not designed to detect or report germline variation, nor does it infer hereditary cancer risk for the patient. Each Signatera assay is designed to a single tumor for a given patient. At this time, multiple personalized Signatera assays cannot be developed for the same patient. This test is designed to detect ctDNA from the assayed tumor only; new primary tumors will not be detected. There is a low risk that a new primary may share a variant that could interfere with the  Signatera test.   Testing  cannot be performed in patients who are pregnant, have a history of bone marrow transplant, or history of blood transfusion within three months. This test is expected to have limited sensitivity in cancer types such as GIST, renal cell carcinomas, primary brain tumors, and lymphoma due to limited ctDNA shed. 1 Bratman SV, [place] Senate, Iafolla MAJ, et al. Personalized circulating tumor DNA analysis as a predictive biomarker in solid tumor patients treated with pembrolizumab. Nature Cancer. 2020;1(9):873-881. 2 Henriksen TV, Veronia Beets, et al., Circulating Tumor DNA in Stage III Colorectal Cancer, beyond Minimal Residual Disease Detection, toward Assessment of Adjuvant Therapy Efficacy and Clinical Behavior of Recurrences. Clin Cancer Res. 2021; 28(3):507-517.  Methodology  FFPE samples are assessed by a pathologist to identify tumor margins and percent tumor content. Tumor DNA is extracted using Qiagen AllPrep. Whole genomic DNA is isolated from peripheral blood                            using QIAamp DNA Blood Mini Kit to provide a baseline DNA sequence. Circulating tumor DNA (ctDNA) is extracted from plasma derived from whole blood samples collected in cell-free DNA blood tubes (Streck) using the QIAsymphony automated or manual extraction method (Qiagen). Using a proprietary algorithm, putative, clonal variants present in the tumor but absent in the germline DNA are identified to design the customized multiplex PCR assay. Whole-exome sequencing is performed on tumor and peripheral blood DNA using KAPA HyperPrep Warden/ranger (Roche) with a custom xGen exome capture (IDT). Using a proprietary algorithm, putative clonal variants present in the tumor but absent in the germline DNA are identified to design the customized multiplex PCR assay. Circulating tumor DNA is extracted from   plasma collected in Streck tubes using [employer] proprietary methods. The customized PCR assays are run to detect presence or absence of these  variants within circulating plasma. A patient's                            plasma sample is considered ctDNA positive when at least two individual-specific tumor variants are detected. When fewer than two individual-specific tumor variants are observed, a negative result is issued. Tumor variation outside of the individual, tumor specific variants is not assessed. Pathology services and whole exome sequencing are performed at [facility] (CLIA ID# [id number]) [address], [place], [place] [address].  Disclaimer  The extraction, Designer, television/film set, and sequencing for this test were performed by [facility] [address], [place], [place] [address] (CLIA [place] [id number]). The data analysis and reporting for this test were performed by [facility] [address], [place], [place] [address] (CLIA [place] [id number]). This test was developed and its performance characteristics determined by [facility]. The test has not been cleared or approved by the U.S. Food and Drug Administration (FDA). CAP accredited, ISO 13485 certified, and CLIA certified.                            Pathology services and whole exome sequencing for this test were performed by [facility], [address], [place] [address], CLIA # [id number].  2021 [employer] All Rights Reserved.   Hospital Outpatient Visit on 11/19/2022   Component Date Value Ref Range Status    SODIUM 11/19/2022 139  136 - 145 mmol/L Final  POTASSIUM 11/19/2022 3.7  3.5 - 5.1 mmol/L Final    CHLORIDE 11/19/2022 107  98 - 107 mmol/L Final    CO2 TOTAL 11/19/2022 25  21 - 31 mmol/L Final    ANION GAP 11/19/2022 7  4 - 13 mmol/L Final    BUN 11/19/2022 14  7 - 25 mg/dL Final    CREATININE 16/01/9603 0.97  0.60 - 1.30 mg/dL Final    BUN/CREA RATIO 11/19/2022 14  6 - 22 Final    ESTIMATED GFR 11/19/2022 70  >59 mL/min/1.45m^2 Final    ALBUMIN 11/19/2022 4.5  3.5 - 5.7 g/dL Final    CALCIUM 54/12/8117 9.8  8.6 - 10.3 mg/dL Final    GLUCOSE 14/78/2956 87  74 - 109 mg/dL  Final    ALKALINE PHOSPHATASE 11/19/2022 59  34 - 104 U/L Final    ALT (SGPT) 11/19/2022 42  7 - 52 U/L Final    AST (SGOT) 11/19/2022 35  13 - 39 U/L Final    BILIRUBIN TOTAL 11/19/2022 0.8  0.3 - 1.0 mg/dL Final    PROTEIN TOTAL 11/19/2022 7.8  6.4 - 8.9 g/dL Final    ALBUMIN/GLOBULIN RATIO 11/19/2022 1.4  0.8 - 1.4 Final    OSMOLALITY, CALCULATED 11/19/2022 277  270 - 290 mOsm/kg Final    CALCIUM, CORRECTED 11/19/2022 9.4  8.9 - 10.8 mg/dL Final    GLOBULIN 21/30/8657 3.3  2.9 - 5.4 Final    MAGNESIUM 11/19/2022 1.9  1.9 - 2.7 mg/dL Final    TSH 84/69/6295 0.153 (L)  0.450 - 5.330 uIU/mL Final    WBC 11/19/2022 4.5  3.8 - 11.8 x10^3/uL Final    RBC 11/19/2022 4.95 (H)  3.63 - 4.92 x10^6/uL Final    HGB 11/19/2022 15.2 (H)  10.9 - 14.3 g/dL Final    HCT 28/41/3244 44.4 (H)  31.2 - 41.9 % Final    MCV 11/19/2022 89.7  75.5 - 95.3 fL Final    MCH 11/19/2022 30.6  24.7 - 32.8 pg Final    MCHC 11/19/2022 34.2  32.3 - 35.6 g/dL Final    RDW 04/23/7251 12.4  12.3 - 17.7 % Final    PLATELETS 11/19/2022 155  140 - 440 x10^3/uL Final    MPV 11/19/2022 10.3  7.9 - 10.8 fL Final    NEUTROPHIL % 11/19/2022 56  43 - 77 % Final    LYMPHOCYTE % 11/19/2022 29  16 - 44 % Final    MONOCYTE % 11/19/2022 10  5 - 13 % Final    EOSINOPHIL % 11/19/2022 5  1 - 7 % Final    BASOPHIL % 11/19/2022 1  0 - 1 % Final    NEUTROPHIL # 11/19/2022 2.50  1.85 - 7.80 x10^3/uL Final    LYMPHOCYTE # 11/19/2022 1.30  1.00 - 3.00 x10^3/uL Final    MONOCYTE # 11/19/2022 0.40  0.30 - 1.00 x10^3/uL Final    EOSINOPHIL # 11/19/2022 0.20  0.00 - 0.50 x10^3/uL Final    BASOPHIL # 11/19/2022 0.00  0.00 - 0.10 x10^3/uL Final    THYROXINE (T4), FREE 11/19/2022 1.32  0.58 - 1.64 ng/dL Final   Hospital Outpatient Visit on 11/13/2022   Component Date Value Ref Range Status    WBC 11/13/2022 4.7  3.8 - 11.8 x10^3/uL Final    RBC 11/13/2022 4.89  3.63 - 4.92 x10^6/uL Final    HGB 11/13/2022 15.0 (H)  10.9 - 14.3 g/dL Final    HCT 66/44/0347 44.4 (H)  31.2 - 41.9 %  Final    MCV 11/13/2022 90.7  75.5 - 95.3 fL Final    MCH 11/13/2022 30.7  24.7 - 32.8 pg Final    MCHC 11/13/2022 33.9  32.3 - 35.6 g/dL Final    RDW 16/01/9603 12.4  12.3 - 17.7 % Final    PLATELETS 11/13/2022 162  140 - 440 x10^3/uL Final    MPV 11/13/2022 9.9  7.9 - 10.8 fL Final    NEUTROPHIL % 11/13/2022 56  43 - 77 % Final    LYMPHOCYTE % 11/13/2022 27  16 - 44 % Final    MONOCYTE % 11/13/2022 10  5 - 13 % Final    EOSINOPHIL % 11/13/2022 6  % Final    BASOPHIL % 11/13/2022 1  0 - 1 % Final    NEUTROPHIL # 11/13/2022 2.60  1.85 - 7.80 x10^3/uL Final    LYMPHOCYTE # 11/13/2022 1.30  1.00 - 3.00 x10^3/uL Final    MONOCYTE # 11/13/2022 0.50  0.30 - 1.00 x10^3/uL Final    EOSINOPHIL # 11/13/2022 0.30  0.00 - 0.50 x10^3/uL Final    BASOPHIL # 11/13/2022 0.00  0.00 - 0.10 x10^3/uL Final   There may be more visits with results that are not included.        Health Maintenance:  Health Maintenance   Topic Date Due    Hepatitis C screening  Never done    Pneumococcal Vaccine, Age 59-64 (1 of 2 - PCV) Never done    HIV Screening  Never done    Adult Tdap-Td (1 - Tdap) Never done    Hepatitis B Vaccine (1 of 3 - 19+ 3-dose series) Never done    Shingles Vaccine (1 of 2) Never done    Breast Cancer Screening  Never done    Colonoscopy  Never done    Covid-19 Vaccine (4 - 2024-25 season) 12/21/2022    NonMedicare Preventative Exam  06/26/2023    Depression Screening  04/07/2024    Influenza Vaccine  Completed        Assessment & Plan   Problem List Items Addressed This Visit          Cardiovascular System    Primary hypertension - Primary (Chronic)     Condition stable will continue current therapy with valsartan and heart healthy diet.               Nephrology    Kidney cancer, primary, with metastasis from kidney to other site, left (CMS [facility]) (Chronic)       Digestive    GERD (gastroesophageal reflux disease) (Chronic)     Condition stable will continue current therapy with Prilosec.               Endocrine    Vitamin D  deficiency (Chronic)     Condition stable will continue current therapy with vitamin d replacement.            Hypothyroid (Chronic)     Condition stable will continue current therapy with synthroid, patient has orders to repeat TSH.             Other Visit Diagnoses       Bilateral hearing loss due to cerumen impaction               Patient has impacted cerumen in bilateral ears, instructed to use debrox for several days and then to come back by office and we will clean out ears, as wax was too hard to remove  when attempted this visit.     Depression screening is negative. PHQ 2 Total: 0       Chronic disease management follow up. Patient is taking and tolerating all medications well without complaint of negative side effects.   Past medical history/medications/labs/ and current plan of care summarized and discussed in detail with patient during visit.   Return to clinic with new or worsening symptoms.        Follow up:  Return in about 4 months (around 08/07/2023), or if symptoms worsen or fail to improve.    This note was partially created using voice recognition software and is inherently subject to errors including those of syntax and "sound alike " substitutions which may escape proof reading.  In such instances, original meaning may be extrapolated by contextual derivation.    Macario Golds, FNP-C  04/08/2023, 11:45

## 2023-04-08 NOTE — Assessment & Plan Note (Signed)
Condition stable will continue current therapy with valsartan and heart healthy diet.

## 2023-04-08 NOTE — Assessment & Plan Note (Signed)
Condition stable will continue current therapy with synthroid, patient has orders to repeat TSH.

## 2023-04-10 ENCOUNTER — Other Ambulatory Visit (INDEPENDENT_AMBULATORY_CARE_PROVIDER_SITE_OTHER): Payer: Self-pay | Admitting: HEMATOLOGY-ONCOLOGY

## 2023-04-16 ENCOUNTER — Encounter (INDEPENDENT_AMBULATORY_CARE_PROVIDER_SITE_OTHER): Payer: Self-pay | Admitting: HEMATOLOGY-ONCOLOGY

## 2023-04-16 ENCOUNTER — Encounter (INDEPENDENT_AMBULATORY_CARE_PROVIDER_SITE_OTHER): Payer: Self-pay | Admitting: NURSE PRACTITIONER

## 2023-04-16 ENCOUNTER — Ambulatory Visit (INDEPENDENT_AMBULATORY_CARE_PROVIDER_SITE_OTHER)
Admission: RE | Admit: 2023-04-16 | Discharge: 2023-04-16 | Disposition: A | Discharge status: Home / Self Care | Payer: 59 | Source: Ambulatory Visit | Attending: HEMATOLOGY-ONCOLOGY | Admitting: HEMATOLOGY-ONCOLOGY

## 2023-04-16 ENCOUNTER — Ambulatory Visit
Admission: RE | Admit: 2023-04-16 | Discharge: 2023-04-16 | Disposition: A | Discharge status: Home / Self Care | Payer: 59 | Source: Ambulatory Visit | Attending: HEMATOLOGY-ONCOLOGY | Admitting: HEMATOLOGY-ONCOLOGY

## 2023-04-16 ENCOUNTER — Other Ambulatory Visit: Payer: Self-pay

## 2023-04-16 ENCOUNTER — Ambulatory Visit (INDEPENDENT_AMBULATORY_CARE_PROVIDER_SITE_OTHER): Payer: Self-pay | Admitting: HEMATOLOGY-ONCOLOGY

## 2023-04-16 ENCOUNTER — Ambulatory Visit (INDEPENDENT_AMBULATORY_CARE_PROVIDER_SITE_OTHER): Payer: 59 | Admitting: HEMATOLOGY-ONCOLOGY

## 2023-04-16 VITALS — BP 154/91 | HR 54 | Temp 97.7°F | Resp 19

## 2023-04-16 VITALS — BP 135/86 | HR 66 | Temp 97.6°F | Ht 69.0 in | Wt 223.4 lb

## 2023-04-16 DIAGNOSIS — C7800 Secondary malignant neoplasm of unspecified lung: Secondary | ICD-10-CM | POA: Insufficient documentation

## 2023-04-16 DIAGNOSIS — Z7901 Long term (current) use of anticoagulants: Secondary | ICD-10-CM | POA: Insufficient documentation

## 2023-04-16 DIAGNOSIS — Z5112 Encounter for antineoplastic immunotherapy: Secondary | ICD-10-CM | POA: Insufficient documentation

## 2023-04-16 DIAGNOSIS — I823 Embolism and thrombosis of renal vein: Secondary | ICD-10-CM | POA: Insufficient documentation

## 2023-04-16 DIAGNOSIS — C642 Malignant neoplasm of left kidney, except renal pelvis: Secondary | ICD-10-CM | POA: Insufficient documentation

## 2023-04-16 LAB — COMPREHENSIVE METABOLIC PANEL, NON-FASTING
ALBUMIN/GLOBULIN RATIO: 1.3 (ref 0.8–1.4)
ALBUMIN: 4.3 g/dL (ref 3.5–5.7)
ALKALINE PHOSPHATASE: 71 U/L (ref 34–104)
ALT (SGPT): 24 U/L (ref 7–52)
ANION GAP: 6 mmol/L (ref 4–13)
AST (SGOT): 25 U/L (ref 13–39)
BILIRUBIN TOTAL: 0.6 mg/dL (ref 0.3–1.0)
BUN/CREA RATIO: 14 (ref 6–22)
BUN: 16 mg/dL (ref 7–25)
CALCIUM, CORRECTED: 9.4 mg/dL (ref 8.9–10.8)
CALCIUM: 9.6 mg/dL (ref 8.6–10.3)
CHLORIDE: 106 mmol/L (ref 98–107)
CO2 TOTAL: 29 mmol/L (ref 21–31)
CREATININE: 1.13 mg/dL (ref 0.60–1.30)
ESTIMATED GFR: 58 mL/min/{1.73_m2} — ABNORMAL LOW (ref >59)
GLOBULIN: 3.2 (ref 2.0–3.5)
GLUCOSE: 94 mg/dL (ref 74–109)
OSMOLALITY, CALCULATED: 282 mosm/kg (ref 270–290)
POTASSIUM: 4.1 mmol/L (ref 3.5–5.1)
PROTEIN TOTAL: 7.5 g/dL (ref 6.4–8.9)
SODIUM: 141 mmol/L (ref 136–145)

## 2023-04-16 LAB — MAGNESIUM: MAGNESIUM: 1.8 mg/dL — ABNORMAL LOW (ref 1.9–2.7)

## 2023-04-16 LAB — CBC WITH DIFF
BASOPHIL #: 0 10*3/uL (ref 0.00–0.10)
BASOPHIL %: 1 % (ref 0–1)
EOSINOPHIL #: 0.2 10*3/uL (ref 0.00–0.50)
EOSINOPHIL %: 4 % (ref 1–7)
HCT: 41.3 % (ref 31.2–41.9)
HGB: 14 g/dL (ref 10.9–14.3)
LYMPHOCYTE #: 1.3 10*3/uL (ref 1.00–3.00)
LYMPHOCYTE %: 32 % (ref 16–44)
MCH: 30.2 pg (ref 24.7–32.8)
MCHC: 33.9 g/dL (ref 32.3–35.6)
MCV: 89 fL (ref 75.5–95.3)
MONOCYTE #: 0.4 10*3/uL (ref 0.30–1.00)
MONOCYTE %: 10 % (ref 5–13)
MPV: 10.2 fL (ref 7.9–10.8)
NEUTROPHIL #: 2.1 10*3/uL (ref 1.85–7.80)
NEUTROPHIL %: 54 % (ref 43–77)
PLATELETS: 132 10*3/uL — ABNORMAL LOW (ref 140–440)
RBC: 4.64 10*6/uL (ref 3.63–4.92)
RDW: 13.7 % (ref 12.3–17.7)
WBC: 4 10*3/uL (ref 3.8–11.8)

## 2023-04-16 LAB — THYROID STIMULATING HORMONE WITH FREE T4 REFLEX: TSH: 0.405 u[IU]/mL — ABNORMAL LOW (ref 0.450–5.330)

## 2023-04-16 LAB — THYROXINE, FREE (FREE T4): THYROXINE (T4), FREE: 0.95 ng/dL (ref 0.61–1.12)

## 2023-04-16 MED ORDER — ONDANSETRON HCL 8 MG TABLET
8.0000 mg | ORAL_TABLET | Freq: Once | ORAL | Status: DC | PRN
Start: 2023-04-16 — End: 2023-04-17

## 2023-04-16 MED ORDER — MEPERIDINE (PF) 25 MG/ML INJECTION SOLUTION
12.5000 mg | Freq: Once | INTRAMUSCULAR | Status: DC | PRN
Start: 2023-04-16 — End: 2023-04-17

## 2023-04-16 MED ORDER — FAMOTIDINE (PF) 20 MG/2 ML INTRAVENOUS SOLUTION
20.0000 mg | Freq: Once | INTRAVENOUS | Status: DC | PRN
Start: 2023-04-16 — End: 2023-04-17

## 2023-04-16 MED ORDER — DIPHENHYDRAMINE 50 MG/ML INJECTION SOLUTION
25.0000 mg | Freq: Once | INTRAMUSCULAR | Status: DC | PRN
Start: 2023-04-16 — End: 2023-04-17

## 2023-04-16 MED ORDER — EPINEPHRINE 1 MG/ML (1 ML) INJECTION SOLUTION
0.3000 mg | Freq: Once | INTRAMUSCULAR | Status: DC | PRN
Start: 2023-04-16 — End: 2023-04-17

## 2023-04-16 MED ORDER — DIPHENHYDRAMINE 50 MG/ML INJECTION SOLUTION
50.0000 mg | Freq: Once | INTRAMUSCULAR | Status: DC | PRN
Start: 2023-04-16 — End: 2023-04-17

## 2023-04-16 MED ORDER — ONDANSETRON HCL (PF) 4 MG/2 ML INJECTION SOLUTION
8.0000 mg | Freq: Once | INTRAMUSCULAR | Status: DC | PRN
Start: 2023-04-16 — End: 2023-04-17

## 2023-04-16 MED ORDER — DEXTROSE 5% IN WATER (D5W) FLUSH BAG - 250 ML
INTRAVENOUS | Status: DC | PRN
Start: 2023-04-16 — End: 2023-04-17

## 2023-04-16 MED ORDER — SODIUM CHLORIDE 0.9% FLUSH BAG - 250 ML
INTRAVENOUS | Status: DC | PRN
Start: 2023-04-16 — End: 2023-04-17

## 2023-04-16 MED ORDER — ALBUTEROL SULFATE HFA 90 MCG/ACTUATION AEROSOL INHALER - RN
2.0000 | Freq: Once | RESPIRATORY_TRACT | Status: DC | PRN
Start: 2023-04-16 — End: 2023-04-17

## 2023-04-16 MED ORDER — SODIUM CHLORIDE 0.9 % INTRAVENOUS SOLUTION
200.0000 mg | Freq: Once | INTRAVENOUS | Status: AC
Start: 2023-04-16 — End: 2023-04-16
  Administered 2023-04-16: 0 mg via INTRAVENOUS
  Administered 2023-04-16: 200 mg via INTRAVENOUS
  Filled 2023-04-16: qty 8

## 2023-04-16 MED ORDER — HYDROCORTISONE SOD SUCCINATE 100 MG/2 ML VIAL WRAPPER
100.0000 mg | Freq: Once | INTRAMUSCULAR | Status: DC | PRN
Start: 2023-04-16 — End: 2023-04-17

## 2023-04-16 MED ORDER — GABAPENTIN 300 MG CAPSULE
300.0000 mg | ORAL_CAPSULE | Freq: Every day | ORAL | 0 refills | Status: AC
Start: 2023-04-16 — End: ?

## 2023-04-16 MED ORDER — ALBUTEROL SULFATE 2.5 MG/3 ML (0.083 %) SOLUTION FOR NEBULIZATION
2.5000 mg | INHALATION_SOLUTION | Freq: Once | RESPIRATORY_TRACT | Status: DC | PRN
Start: 2023-04-16 — End: 2023-04-17

## 2023-04-16 NOTE — Nurses Notes (Signed)
1148-Patient to room ambulatory.  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  Patient is oriented x4, lung sounds are clear, and strong palpable pulses.  Vitals are stable.Salli Quarry, RN     Patient Assessment/Symptom Management Patient Has MD Appointment Today   Key: (+) Symptom present           (-)  Symptom not present If Symptom is Positive(+) a Nursing Note is required   Edema -   Uncontrolled Nausea -   Vomiting -   Inability to eat/drink -   Mouth Sores -   Diarrhea -   Constipation (? Last BM) -   Fatigue that interferes with ADL's -   Numbness/Tingling -change +   Other -   Fever/Signs & Symptoms of infection -   Nurse Initials KB         1220-Peripheral IV placed and blood return obtained.Salli Quarry, RN  1232-KEYTRUDA infusion started.Salli Quarry, RN  1302-KEYTRUDA infusion completed.  Patient tolerated well.Salli Quarry, RN  1435-Peripheral IV removed with catheter intact.  Blood return obtained prior to Janeece Riggers, RN  1438-Patient checking out at this time.  Vitals stable.Salli Quarry, RN

## 2023-04-16 NOTE — Nursing Note (Signed)
Patient in room. Went over labs. Patient states the she is having some joint pains in her hands especially the right hand with some numbness. Went over scan. Patient to have an EGD in jan. Patient asked how long she will be continuing this treatment and it was said that at least one year. All questions answered. Patient to have treatment today.

## 2023-04-16 NOTE — Progress Notes (Signed)
Department of Hematology/Oncology  History and Physical    Name: Taylor Smith  ZOX:[id number]  Date of Birth: 1971-04-09  Encounter Date: 04/16/2023    REFERRING PROVIDER:  Macario Golds, FNP-C  [address]  [place],  [place] [address]    TELEMEDICINE DOCUMENTATION:  Patient Location:  [facility], [facility] outpatient Hematology/Oncology [address] [place] [address]  Patient/family aware of provider location:  yes  Patient/family consent for telemedicine:  yes    REASON FOR OFFICE VISIT:  New patient for evaluation and management of stage IV clear cell renal carcinoma    HISTORY OF PRESENT ILLNESS:A  Taylor Smith is a 52 y.o. 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  She was originally diagnosed after having a biopsy in the genitourinary area.  It came back consistent with clear cell renal cell carcinoma, which prompted an imaging study of the chest, abdomen, and pelvis.  She was found to have a massive left kidney mass with numerous small pulmonary nodules.  The renal mass appears to be involving the renal vein but not the inferior vena cava.    Prior to this, she was not having any symptoms.  Specifically, she did not have any hematuria or history of iron-deficiency anemia.  She does report back pain which has been going on for the past year but it is unclear whether that was related or not.    02/07/2022: The patient is here for follow up of stage IV kidney cancer.  She states that she tolerated the 1st treatment well.  She did have some achiness of the legs which was pretty brief in nature.    02/28/2022: The patient is here for follow up of stage IV kidney cancer.  She states that she has some pain in her legs for a few days following treatment.  She also reports that she is had a cough which has been present since her most recent surgery.  Today will be her 3rd cycle of treatment, and she has a CT scan  scheduled for December 20.    03/21/2022:  The patient is here for follow up of metastatic renal cell carcinoma.  She was due for day 1 of cycle 4 of combination ipilimumab and nivolumab.  She was having some tolerability issues with the combination, but is receiving additional premedications and states that it is helping to make the treatments easier.  She does not have any new issues at this time.    04/11/2022: The patient is here for follow up of metastatic renal cell cancer.  She has been on treatment with combination immunotherapy.  At last contact, her transaminases were mildly elevated.  On this visit, her bilirubin and transaminases are markedly higher.  She states that she did see a surgeon yesterday for consideration of nephrectomy.  She indicates that there was not a discussion regarding a jaundiced appearance at that time.  She had CT imaging performed approximately 2 days ago.    04/18/2022: The patient is here for follow up of metastatic renal cell carcinoma.  Immunotherapy has been on hold due to adverse liver function testing.  She has been on prednisone in her numbers have improved considerably.  She did go to the emergency room for an episode of abdominal pain recently and was prescribed Bentyl.    06/13/2022:  She states that she recently received Inlyta, and has not been on it for long.  She  has a follow up visit with [facility] in early April.  She is having more issues with back pain, primarily when laying down.  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    07/11/2022: The patient is here for follow up of metastatic renal cell carcinoma.  She states that she will be having follow up as due on April 17, and a CT scan will be performed around that time.  She states that there is some discussion about potentially doing surgery at some point if possible.    08/15/2022: The patient is here for follow up of  metastatic renal cell carcinoma.  She reports some shortness of breath with exertion.  She has had prior CT angiogram which have not shown any evidence of PE, and she was recently assessed at [facility] last week.  She does not have any significant anemia.  Her most recent imaging study indicates that her cancer is decreasing in size.    09/29/2022: The patient is here for follow up renal cell carcinoma.  She states that she has discontinued the Inlyta in anticipation of surgery being performed next week.    11/10/2022:  The patient is here for follow up of renal cell carcinoma.  Since the last visit, she ended up having resection of the lesion.  Clear margins were obtained, and 5 lymph nodes were negative for involvement.  She also had no evidence of any lymphovascular invasion at the time.    12/10/2022: The patient is here for follow up of metastatic renal cell carcinoma.  She was seen at [facility] and has been told that she is essentially cancer-free at this time and no additional therapy was advised.    04/16/2023: The patient is here for follow up of metastatic renal cell cancer.  She is doing well and denies any new problems at this time other than some discomfort in her right hand because she states that she has always receiving her infusions in that same location.    ROS:   Review of Systems   Constitutional:  Negative for appetite change, chills and fatigue.   HENT:   Negative for sore throat and trouble swallowing.    Eyes:  Negative for eye problems.   Respiratory:  Negative for cough and shortness of breath.    Cardiovascular:  Negative for chest pain and leg swelling.   Gastrointestinal:  Negative for abdominal pain.   Genitourinary:  Negative for dysuria and hematuria.    Musculoskeletal:  Negative for arthralgias and gait problem.   Skin:  Negative for rash.   Neurological:  Negative for gait problem.   Hematological:  Negative for adenopathy.   Psychiatric/Behavioral:  Negative for depression.          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)     Kidney cancer, primary, with metastasis from kidney to other site, left (CMS [facility])          Past Surgical History:   Procedure Laterality Date    HX CESAREAN SECTION      HX CHOLECYSTECTOMY      HX CYST REMOVAL Right     HX HYSTERECTOMY      NEPHROURETERECTOMY Left     TOTAL THYROIDECTOMY  Social History     Socioeconomic History    Marital status: Married     Spouse name: Not on file    Number of children: Not on file    Years of education: Not on file    Highest education level: Not on file   Occupational History    Not on file   Tobacco Use    Smoking status: Never    Smokeless tobacco: Never   Vaping Use    Vaping status: Never Used   Substance and Sexual Activity    Alcohol use: Never    Drug use: Never    Sexual activity: Yes     Partners: Male   Other Topics Concern    Not on file   Social History Narrative    Not on file     Social Determinants of Health     Financial Resource Strain: Not on file   Transportation Needs: No Transportation Needs (10/10/2022)    Received from [facility]    PRAPARE - Transportation     In the past 12 months, has lack of transportation kept you from medical appointments or from getting medications?: No     Lack of Transportation (Non-Medical): No   Social Connections: Not on file   Intimate Partner Violence: High Risk (01/13/2022)    Intimate Partner Violence     SDOH Domestic Violence: No   Housing Stability: Not on file     Family Medical History:       Problem Relation (Age of Onset)    Cancer Maternal Aunt, Maternal Grandmother    Colon Cancer Maternal Uncle    Lung Cancer Mother    Melanoma Maternal Aunt            Current Outpatient Medications   Medication Sig    ergocalciferol, vitamin D2, (DRISDOL)  1,250 mcg (50,000 unit) Oral Capsule Take 1 Capsule (50,000 Units total) by mouth Every 7 days    Ibuprofen (MOTRIN) 600 mg Oral Tablet Take 1 Tablet (600 mg total) by mouth Every 6 hours as needed    levothyroxine (SYNTHROID) 150 mcg Oral Tablet Take 1 Tablet (150 mcg total) by mouth Every morning    omeprazole (PRILOSEC) 40 mg Oral Capsule, Delayed Release(E.C.) Take 1 Capsule (40 mg total) by mouth Once a day    pembrolizumab (KEYTRUDA) 25 mg/mL Intravenous Solution Infuse 8 mL (200 mg total) into a venous catheter Every 21 days    valsartan (DIOVAN) 40 mg Oral Tablet Take 2 Tablets (80 mg total) by mouth Twice daily (Patient taking differently: Take 1 Tablet (40 mg total) by mouth Once a day)     Allergies   Allergen Reactions    Aldactone [Spironolactone] Itching       PHYSICAL EXAM:  Most Recent Vitals    Flowsheet Row Telemedicine from 01/13/2022 in Hematology/Oncology,   [facility]   Temperature 36.7 C (98.1 F) filed at... 01/13/2022 1431   Heart Rate 72 filed at... 01/13/2022 1431   Respiratory Rate --   BP (Non-Invasive) 146/73 filed at... 01/13/2022 1431   SpO2 97 % filed at... 01/13/2022 1431   Height 1.753 m (5\' 9" ) filed at... 01/13/2022 1431   Weight 94.2 kg (207 lb 9.6 oz) filed at... 01/13/2022 1431   BMI (Calculated) 30.72 filed at... 01/13/2022 1431   BSA (Calculated) 2.14 filed at... 01/13/2022 1431      ECOG Status: (0) Fully active, able to carry on all  predisease performance without restriction   Physical Exam  Constitutional:       General: She is not in acute distress.     Appearance: Normal appearance.   Eyes:      Extraocular Movements: Extraocular movements intact.   Cardiovascular:      Rate and Rhythm: Normal rate and regular rhythm.   Pulmonary:      Effort: Pulmonary effort is normal.   Abdominal:      General: Abdomen is flat.      Palpations: Abdomen is soft.   Musculoskeletal:         General: Normal range of motion.      Cervical back: Normal range of motion.    Skin:     General: Skin is warm and dry.   Neurological:      General: No focal deficit present.      Mental Status: She is alert.   Psychiatric:         Mood and Affect: Mood normal.         DIAGNOSTIC DATA:  No results found for this or any previous visit (from the past 56213 hour(s)).    LABS:   CBC  Diff   Lab Results   Component Value Date/Time    WBC 4.0 04/16/2023 11:05 AM    HGB 14.0 04/16/2023 11:05 AM    HCT 41.3 04/16/2023 11:05 AM    PLTCNT 132 (L) 04/16/2023 11:05 AM    RBC 4.64 04/16/2023 11:05 AM    MCV 89.0 04/16/2023 11:05 AM    MCHC 33.9 04/16/2023 11:05 AM    MCH 30.2 04/16/2023 11:05 AM    RDW 13.7 04/16/2023 11:05 AM    MPV 10.2 04/16/2023 11:05 AM    Lab Results   Component Value Date/Time    PMNS 54 04/16/2023 11:05 AM    LYMPHOCYTES 32 04/16/2023 11:05 AM    EOSINOPHIL 4 04/16/2023 11:05 AM    MONOCYTES 10 04/16/2023 11:05 AM    BASOPHILS 1 04/16/2023 11:05 AM    BASOPHILS 0.00 04/16/2023 11:05 AM    PMNABS 2.10 04/16/2023 11:05 AM    LYMPHSABS 1.30 04/16/2023 11:05 AM    EOSABS 0.20 04/16/2023 11:05 AM    MONOSABS 0.40 04/16/2023 11:05 AM            ASSESSMENT:    ICD-10-CM    1. Kidney cancer, primary, with metastasis from kidney to other site, left (CMS HCC)  C64.2            PLAN:   1. All relevant medical records were reviewed including available pertinent provider notes, procedure notes, imaging, laboratory, and pathology.   2. All pertinent labs and/or imaging were reviewed with the patient.   3. Stage IV renal carcinoma:  She has had a great response to treatment, but has known pulmonary metastases.  She is tolerating treatment well and her scans have looked excellent.  We will continue with treatment unchanged.  We discussed that two years of therapy is considered reasonable in the setting of metastatic disease.  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  5. Pain:  Improved.      Taylor Smith was given the chance to ask questions, and these were answered to their satisfaction.  The patient is welcome to call with any questions or concerns in the meantime.     On the day of the encounter, a total of 35 minutes was spent on this patient encounter including review of historical information,  examination, documentation and post-visit activities.   Return in about 3 weeks (around 05/07/2023).     Lupita Dawn, MD  04/16/2023, 11:36  The patient was seen as part of a collaborative telemedicine service with Dr. Damita Lack who participated in the encounter by active presence via approved video/audio means for portions of the encounter.  The patient's insurance company bears full legal and financial responsibility resulting from any deviations they cause to my recommended treatment plan.  CC:  Macario Golds, FNP-C  [address]  [place] [address]    Macario Golds, FNP-C  [address]  [place],  [place] [address]    This note was partially generated using MModal Fluency Direct system, and there may be some incorrect words, spellings, and punctuation that were not noted in checking the note before saving.

## 2023-04-21 ENCOUNTER — Other Ambulatory Visit: Payer: Self-pay

## 2023-04-29 ENCOUNTER — Ambulatory Visit (INDEPENDENT_AMBULATORY_CARE_PROVIDER_SITE_OTHER): Payer: Self-pay | Admitting: NURSE PRACTITIONER

## 2023-04-30 ENCOUNTER — Encounter (INDEPENDENT_AMBULATORY_CARE_PROVIDER_SITE_OTHER): Payer: Self-pay | Admitting: HEMATOLOGY-ONCOLOGY

## 2023-05-05 ENCOUNTER — Encounter (INDEPENDENT_AMBULATORY_CARE_PROVIDER_SITE_OTHER): Payer: Self-pay | Admitting: HEMATOLOGY-ONCOLOGY

## 2023-05-06 ENCOUNTER — Ambulatory Visit (HOSPITAL_COMMUNITY): Payer: 59 | Admitting: Certified Registered"

## 2023-05-06 ENCOUNTER — Encounter (INDEPENDENT_AMBULATORY_CARE_PROVIDER_SITE_OTHER): Payer: Self-pay | Admitting: HEMATOLOGY-ONCOLOGY

## 2023-05-06 ENCOUNTER — Encounter (HOSPITAL_COMMUNITY)
Admission: RE | Disposition: A | Discharge status: Home / Self Care | Payer: Self-pay | Source: Ambulatory Visit | Attending: Surgery

## 2023-05-06 ENCOUNTER — Encounter (HOSPITAL_COMMUNITY): Payer: Self-pay | Admitting: Surgery

## 2023-05-06 ENCOUNTER — Other Ambulatory Visit: Payer: Self-pay

## 2023-05-06 ENCOUNTER — Ambulatory Visit
Admission: RE | Admit: 2023-05-06 | Discharge: 2023-05-06 | Disposition: A | Discharge status: Home / Self Care | Payer: 59 | Source: Ambulatory Visit | Attending: Surgery | Admitting: Surgery

## 2023-05-06 ENCOUNTER — Ambulatory Visit (HOSPITAL_COMMUNITY): Payer: 59 | Admitting: Surgery

## 2023-05-06 DIAGNOSIS — E039 Hypothyroidism, unspecified: Secondary | ICD-10-CM | POA: Insufficient documentation

## 2023-05-06 DIAGNOSIS — I10 Essential (primary) hypertension: Secondary | ICD-10-CM | POA: Insufficient documentation

## 2023-05-06 DIAGNOSIS — K219 Gastro-esophageal reflux disease without esophagitis: Secondary | ICD-10-CM | POA: Insufficient documentation

## 2023-05-06 DIAGNOSIS — K209 Esophagitis, unspecified without bleeding: Secondary | ICD-10-CM | POA: Insufficient documentation

## 2023-05-06 DIAGNOSIS — K2950 Unspecified chronic gastritis without bleeding: Secondary | ICD-10-CM | POA: Insufficient documentation

## 2023-05-06 DIAGNOSIS — Z85528 Personal history of other malignant neoplasm of kidney: Secondary | ICD-10-CM | POA: Insufficient documentation

## 2023-05-06 SURGERY — GASTROSCOPY WITH BIOPSY
Anesthesia: General | Wound class: Clean Contaminated Wounds-The respiratory, GI, Genital, or urinary

## 2023-05-06 MED ORDER — DEXTROSE 5 % AND LACTATED RINGERS INTRAVENOUS SOLUTION
INTRAVENOUS | Status: DC | PRN
Start: 2023-05-06 — End: 2023-05-06
  Administered 2023-05-06: 0 via INTRAVENOUS

## 2023-05-06 MED ORDER — PROPOFOL 10 MG/ML IV BOLUS
INJECTION | Freq: Once | INTRAVENOUS | Status: DC | PRN
Start: 2023-05-06 — End: 2023-05-06
  Administered 2023-05-06: 50 mg via INTRAVENOUS
  Administered 2023-05-06: 30 mg via INTRAVENOUS
  Administered 2023-05-06 (×2): 20 mg via INTRAVENOUS

## 2023-05-06 MED ORDER — LIDOCAINE (PF) 100 MG/5 ML (2 %) INTRAVENOUS SYRINGE
INJECTION | Freq: Once | INTRAVENOUS | Status: DC | PRN
Start: 2023-05-06 — End: 2023-05-06
  Administered 2023-05-06: 100 mg via INTRAVENOUS

## 2023-05-06 SURGICAL SUPPLY — 2 items
DETERGENT INSTR 22OZ TRNSPT GEL RINSE FREE NEUT PH PREKLENZ CLR PLSNT LF (MISCELLANEOUS PT CARE ITEMS) ×1 IMPLANT
FORCEPS BIOPSY MICROMESH TTH STREAMLINE CATH NEEDLE 240CM 2.4MM RJ 4 SS LRG CPC STRL DISP ORNG 2.8MM (ENDOSCOPIC SUPPLIES) IMPLANT

## 2023-05-06 NOTE — Nurses Notes (Signed)
Discharge instructions given to patient with verbalized understanding.  Preparing for discharge.

## 2023-05-06 NOTE — H&P (Signed)
[facility]  General Surgery  History and Physical    Date of Service:  05/06/2023  Taylor Smith, Taylor Smith, 53 y.o. female  Date of Admission:  05/06/2023  Date of Birth:  [birth year]  PCP: Macario Golds, FNP-C    Reason for admission:  EGD with biopsy    HPI:  Taylor Smith is a 53 y.o. White female who is admitted for Dyspepsia     Ms. Bisher presents today for EGD because of dyspepsia and CT scan findings showing possible thickening of the gastric fundus in his patient who has significant history of left kidney cancer with Mets.     Negative diabetes, blood thinner        Review of the result(s) of each unique test:  Patient underwent diagnostic testing ( as above ) prior to this dates visit.  I have personally reviewed the results and that serves as a component of the medical decision making for this encounter        Review of prior external note(s) from each unique source:  Patients referral to this office including a recent assessment by the referring provider.  This was reviewed by me for this unique office visit for the indication and intent of the referral as well as any pertinent medical or surgical history relevant to the patients independent evaluation by me today.    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)     Kidney cancer, primary, with metastasis from kidney to other site, left (CMS [facility])       Past Surgical History:   Procedure Laterality Date    HX CESAREAN SECTION      HX CHOLECYSTECTOMY      HX CYST REMOVAL Right     HX HYSTERECTOMY      NEPHROURETERECTOMY Left     TOTAL THYROIDECTOMY        Social History     Tobacco Use    Smoking status: Never    Smokeless tobacco: Never   Vaping Use    Vaping status: Never Used   Substance Use Topics    Alcohol use: Never    Drug use: Never       Family Medical History:       Problem Relation  (Age of Onset)    Cancer Maternal Aunt, Maternal Grandmother    Colon Cancer Maternal Uncle    Lung Cancer Mother    Melanoma Maternal Aunt           Medications Prior to Admission       Prescriptions    ergocalciferol, vitamin D2, (DRISDOL) 1,250 mcg (50,000 unit) Oral Capsule    Take 1 Capsule (50,000 Units total) by mouth Every 7 days    gabapentin (NEURONTIN) 300 mg Oral Capsule    Take 1 Capsule (300 mg total) by mouth Once a day    Ibuprofen (MOTRIN) 600 mg Oral Tablet    Take 1 Tablet (600 mg total) by mouth Every 6 hours as needed    levothyroxine (SYNTHROID) 150 mcg Oral Tablet    Take 1 Tablet (150 mcg total) by mouth Every morning    omeprazole (PRILOSEC) 40 mg Oral Capsule, Delayed Release(E.C.)    Take 1 Capsule (40 mg total) by mouth Once a day    pembrolizumab (KEYTRUDA) 25 mg/mL Intravenous Solution  Infuse 8 mL (200 mg total) into a venous catheter Every 21 days    valsartan (DIOVAN) 40 mg Oral Tablet    Take 2 Tablets (80 mg total) by mouth Twice daily    Patient taking differently:  Take 1 Tablet (40 mg total) by mouth Once a day           Allergies   Allergen Reactions    Aldactone [Spironolactone] Itching          Patient Vitals for the past 24 hrs:   BP Temp Pulse Resp SpO2 Height Weight   05/06/23 1030 (!) 125/95 -- 62 20 98 % -- --   05/06/23 1014 124/84 36.4 C (97.5 F) 92 14 96 % -- --   05/06/23 0900 (!) 136/103 -- -- -- -- -- --   05/06/23 0851 (!) 136/94 36.8 C (98.3 F) 56 20 97 % 1.753 m (5\' 9" ) 101 kg (223 lb)          General: appropriate for age. in no acute distress.    Vital signs are present above and have been reviewed by me     HEENT: Atraumatic, Normocephalic. PERRLA, EOMI. Nose clear. Throat clear.    Lungs: Nonlabored breathing with symmetric expansion.  Clear to auscultation bilaterally    Heart:Regular wth respect to rate and rythmn.    Abdomen:Soft. Nontender. Nondistended and benign    Extremities:  Grossly normal with good range of motion and no major  deformities.    Neuro:  Grossly normal motor and sensory function. CN's II through XII intact.    Psychiatric: Alert and oriented to person, place, and time. affect appropriate    Laboratory Data:     No results found for any visits on 05/06/23 (from the past 24 hour(s)).    Imaging Studies:    No orders to display        Assessment/Plan:  Dyspepsia    EGD with biopsy scheduled for 05/06/2023 at 9:30 a.m.      Discussed indications, risks, and benefits of EGD with biopsy with the patient.  Discussed the possibility of polypectomy, biopsies, and possible repeat examinations.  Risks include bleeding, sedation risks, possibility of missed diagnosis of polyp or malignancy, and remote possibilities of perforation and death.  All questions were answered, and informed consent was clearly obtained.    This note was partially created using voice recognition software and is inherently subject to errors including those of syntax and "sound alike " substitutions which may escape proof reading. In such instances, original meaning may be extrapolated by contextual derivation.    Fidela Juneau, MD, MBA, FACS

## 2023-05-06 NOTE — Discharge Instructions (Addendum)
SURGICAL DISCHARGE INSTRUCTIONS     Dr. Donnal Debar, Gene B, MD  performed your EGD WITH BIOPSY today at the [facility]    [facility]:  Monday through Friday from 8 a.m. - 4 p.m.: [phone]    For T&D: [phone]  Between 4 p.m. - 8 a.m., weekends and holidays:  Call ER [phone]    PLEASE SEE WRITTEN HANDOUTS AS DISCUSSED BY YOUR NURSE:  Levon Hedger      ANESTHESIA INFORMATION   ANESTHESIA -- ADULT PATIENTS:  You have received intravenous sedation / general anesthesia, and you may feel drowsy and light-headed for several hours. You may even experience some forgetfulness of the procedure. DO NOT DRIVE A MOTOR VEHICLE or perform any activity requiring complete alertness or coordination until you feel fully awake in about 24-48 hours. Do not drink alcoholic beverages for at least 24 hours. Do not stay alone, you must have a responsible adult available to be with you. You may also experience a dry mouth or nausea for 24 hours. This is a normal side effect and will disappear as the effects of the medication wear off.    REMEMBER   If you experience any difficulty breathing, chest pain, bleeding that you feel is excessive, persistent nausea or vomiting or for any other concerns:  Call your physician Dr.  Donnal Debar, Cherylann Parr, MD   at [phone] . You may also ask to have the general doctor on call paged. They are available to you 24 hours a day.      SPECIAL INSTRUCTIONS / COMMENTS   No driving, operating heavy equipment, cooking or signing of legal documents for at least 24 hours.  Change position slowly.  Increase fluid today and gradually advance diet.  Over the counter Pepcid complete, Gaviscon or Tums for any symptoms as discussed with Dr Baldomero Lamy.  Follow up with Dr Baldomero Lamy as needed.    FOLLOW-UP APPOINTMENTS   Please call your surgeon's office at the number listed to schedule a date / time of return for follow-up.     Dr Stephanie Acre  [phone]

## 2023-05-06 NOTE — OR Surgeon (Signed)
[facility]    Patient Name: Ivan, Benne Number: [id number]  Date of Service: 05/06/2023   Date of Birth: [birth year]      Pre-Operative Diagnosis: Dyspepsia     Post-Operative Diagnosis: mild gastritis  grade B esophagitis    Procedure(s)/Description:  EGD WITH BIOPSY: 43239 (CPT)     Attending Surgeon: Fidela Juneau, MD     Anesthesia:  CRNA: Leigh Aurora, CRNA    Anesthesia Type: .General     Estimated Blood Loss:  Minimal    The patient indicates that they have read and understood the preoperative endoscopy consent form. The benefits, risks and alternatives to the procedure were discussed as well as specifically the risk of bleeding and/or perforation requiring operation. The patient indicates they have no further question and wish to proceed. Informed consent was obtained from the patient and/or medical power of attorney.  The patient was brought in to the endoscopy suite and placed on the stretcher in the left lateral decubitus position. The video gastroscope was then inserted into the mouth, down the esophagus and into the stomach after adequate IV and topical anesthetic was provided. The stomach was insufflated with air and examination of the stomach was performed. Antral biopsy was obtained and sent to the Pathology department for microscopic examination. Hemostasis was well obtained.  The scope was advanced to the pyloric channel.  The pylorus was cannulated and the scope was advanced into the duodenum without any difficulty. Examination of the first and second portions of the duodenum was performed and the findings were noted as above. The scope was withdrawn back into the stomach in which an extensive examination was performed with the above noted findings. Retroflexion of the scope was performed which gave good visualization of the proximal stomach and GE junction from below. The scope was pulled back up into the proximal stomach and GE junction, with the above noted  findings.  The scope was withdrawn back up into the esophagus and out the mouth without any difficulty. The patient tolerated the procedure well. No complications were encountered.   There were no unplanned events.  EKG, pulse, pulse oximetry and blood pressure were monitored throughout the entire procedure.    The patient was instructed to continue taking her PPIs and for any breakthrough symptoms she can use over-the-counter Pepcid complete, Gaviscon or Tums.    Return to clinic p.r.n.    Anniebell Bedore B. Altair Stanko, MD, MBA, FACS  [facility] -General Surgery

## 2023-05-06 NOTE — Anesthesia Preprocedure Evaluation (Signed)
ANESTHESIA PRE-OP EVALUATION  Planned Procedure: EGD WITH BIOPSY  Review of Systems     anesthesia history negative     patient summary reviewed  nursing notes reviewed        Pulmonary  negative pulmonary ROS,    Cardiovascular    Hypertension and ECG reviewed ,No peripheral edema,        GI/Hepatic/Renal    GERD and well controlled        Endo/Other    hypothyroidism,      Neuro/Psych/MS   negative neuro/psych ROS,      Cancer  CA (kidney- in remission),                 Physical Assessment      Airway       Mallampati: III    TM distance: 3 FB    Neck ROM: full  Mouth Opening: good.  No Facial hair  No Beard  No endotracheal tube present  No Tracheostomy present    Dental                    Pulmonary    Breath sounds clear to auscultation       Cardiovascular    Rhythm: regular  Rate: Normal  (-) no friction rub, carotid bruit is not present, no peripheral edema and no murmur     Other findings          Plan  ASA 2     Planned anesthesia type: general     total intravenous anesthesia                    Intravenous induction       Anesthetic plan and risks discussed with patient           Patient's NPO status is appropriate for Anesthesia.

## 2023-05-06 NOTE — Nurses Notes (Signed)
Sitting up drinking clear fluids.  Husband at bedside.

## 2023-05-06 NOTE — Anesthesia Postprocedure Evaluation (Signed)
Anesthesia Post Op Evaluation    Patient: Taylor Smith  Procedure(s):  EGD WITH BIOPSY    Last Vitals:Temperature: 36.4 C (97.5 F) (05/06/23 1014)  Heart Rate: 92 (05/06/23 1014)  BP (Non-Invasive): 124/84 (05/06/23 1014)  Respiratory Rate: 14 (05/06/23 1014)  SpO2: 96 % (05/06/23 1014)    No notable events documented.    Patient is sufficiently recovered from the effects of anesthesia to participate in the evaluation and has returned to their pre-procedure level.  Patient location during evaluation: PACU       Patient participation: complete - patient participated  Level of consciousness: awake and alert and responsive to verbal stimuli    Pain score: 0  Pain management: adequate  Airway patency: patent    Anesthetic complications: no  Cardiovascular status: acceptable  Respiratory status: acceptable  Hydration status: acceptable  Patient post-procedure temperature: Pt Normothermic   PONV Status: Absent

## 2023-05-07 ENCOUNTER — Ambulatory Visit
Admission: RE | Admit: 2023-05-07 | Discharge: 2023-05-07 | Disposition: A | Discharge status: Home / Self Care | Payer: 59 | Source: Ambulatory Visit | Attending: HEMATOLOGY-ONCOLOGY

## 2023-05-07 ENCOUNTER — Encounter (INDEPENDENT_AMBULATORY_CARE_PROVIDER_SITE_OTHER): Payer: Self-pay | Admitting: NURSE PRACTITIONER

## 2023-05-07 ENCOUNTER — Other Ambulatory Visit: Payer: Self-pay

## 2023-05-07 ENCOUNTER — Ambulatory Visit (INDEPENDENT_AMBULATORY_CARE_PROVIDER_SITE_OTHER)
Admission: RE | Admit: 2023-05-07 | Discharge: 2023-05-07 | Disposition: A | Discharge status: Home / Self Care | Payer: 59 | Source: Ambulatory Visit | Attending: HEMATOLOGY-ONCOLOGY

## 2023-05-07 ENCOUNTER — Ambulatory Visit (INDEPENDENT_AMBULATORY_CARE_PROVIDER_SITE_OTHER): Payer: 59 | Admitting: NURSE PRACTITIONER

## 2023-05-07 ENCOUNTER — Other Ambulatory Visit (INDEPENDENT_AMBULATORY_CARE_PROVIDER_SITE_OTHER): Payer: Self-pay | Admitting: HEMATOLOGY-ONCOLOGY

## 2023-05-07 ENCOUNTER — Telehealth (INDEPENDENT_AMBULATORY_CARE_PROVIDER_SITE_OTHER): Payer: Self-pay | Admitting: Surgery

## 2023-05-07 VITALS — BP 146/93 | HR 83 | Temp 98.4°F | Ht 69.0 in | Wt 223.9 lb

## 2023-05-07 VITALS — BP 141/90 | HR 64 | Temp 98.0°F | Resp 20

## 2023-05-07 DIAGNOSIS — C642 Malignant neoplasm of left kidney, except renal pelvis: Secondary | ICD-10-CM

## 2023-05-07 DIAGNOSIS — R933 Abnormal findings on diagnostic imaging of other parts of digestive tract: Secondary | ICD-10-CM

## 2023-05-07 DIAGNOSIS — I823 Embolism and thrombosis of renal vein: Secondary | ICD-10-CM | POA: Insufficient documentation

## 2023-05-07 DIAGNOSIS — K2950 Unspecified chronic gastritis without bleeding: Secondary | ICD-10-CM

## 2023-05-07 DIAGNOSIS — Z5112 Encounter for antineoplastic immunotherapy: Secondary | ICD-10-CM | POA: Insufficient documentation

## 2023-05-07 DIAGNOSIS — Z7901 Long term (current) use of anticoagulants: Secondary | ICD-10-CM | POA: Insufficient documentation

## 2023-05-07 DIAGNOSIS — C7800 Secondary malignant neoplasm of unspecified lung: Secondary | ICD-10-CM | POA: Insufficient documentation

## 2023-05-07 DIAGNOSIS — Z79620 Long term (current) use of immunosuppressive biologic: Secondary | ICD-10-CM | POA: Insufficient documentation

## 2023-05-07 DIAGNOSIS — Z2989 Encounter for other specified prophylactic measures: Secondary | ICD-10-CM

## 2023-05-07 LAB — CBC WITH DIFF
BASOPHIL #: 0 10*3/uL (ref 0.00–0.10)
BASOPHIL %: 1 % (ref 0–1)
EOSINOPHIL #: 0.2 10*3/uL (ref 0.00–0.50)
EOSINOPHIL %: 4 % (ref 1–7)
HCT: 42.8 % — ABNORMAL HIGH (ref 31.2–41.9)
HGB: 14.7 g/dL — ABNORMAL HIGH (ref 10.9–14.3)
LYMPHOCYTE #: 1.2 10*3/uL (ref 1.00–3.00)
LYMPHOCYTE %: 27 % (ref 16–44)
MCH: 30.4 pg (ref 24.7–32.8)
MCHC: 34.3 g/dL (ref 32.3–35.6)
MCV: 88.6 fL (ref 75.5–95.3)
MONOCYTE #: 0.3 10*3/uL (ref 0.30–1.00)
MONOCYTE %: 7 % (ref 5–13)
MPV: 10 fL (ref 7.9–10.8)
NEUTROPHIL #: 2.6 10*3/uL (ref 1.85–7.80)
NEUTROPHIL %: 61 % (ref 43–77)
PLATELETS: 142 10*3/uL (ref 140–440)
RBC: 4.83 10*6/uL (ref 3.63–4.92)
RDW: 13.2 % (ref 12.3–17.7)
WBC: 4.3 10*3/uL (ref 3.8–11.8)

## 2023-05-07 LAB — COMPREHENSIVE METABOLIC PANEL, NON-FASTING
ALBUMIN/GLOBULIN RATIO: 1.3 (ref 0.8–1.4)
ALBUMIN: 4.3 g/dL (ref 3.5–5.7)
ALKALINE PHOSPHATASE: 73 U/L (ref 34–104)
ALT (SGPT): 25 U/L (ref 7–52)
ANION GAP: 10 mmol/L (ref 4–13)
AST (SGOT): 26 U/L (ref 13–39)
BILIRUBIN TOTAL: 0.7 mg/dL (ref 0.3–1.0)
BUN/CREA RATIO: 6 (ref 6–22)
BUN: 7 mg/dL (ref 7–25)
CALCIUM, CORRECTED: 9.3 mg/dL (ref 8.9–10.8)
CALCIUM: 9.5 mg/dL (ref 8.6–10.3)
CHLORIDE: 106 mmol/L (ref 98–107)
CO2 TOTAL: 23 mmol/L (ref 21–31)
CREATININE: 1.13 mg/dL (ref 0.60–1.30)
ESTIMATED GFR: 58 mL/min/{1.73_m2} — ABNORMAL LOW (ref >59)
GLOBULIN: 3.3 (ref 2.0–3.5)
GLUCOSE: 126 mg/dL — ABNORMAL HIGH (ref 74–109)
OSMOLALITY, CALCULATED: 277 mosm/kg (ref 270–290)
POTASSIUM: 3.9 mmol/L (ref 3.5–5.1)
PROTEIN TOTAL: 7.6 g/dL (ref 6.4–8.9)
SODIUM: 139 mmol/L (ref 136–145)

## 2023-05-07 LAB — THYROID STIMULATING HORMONE WITH FREE T4 REFLEX: TSH: 1.501 u[IU]/mL (ref 0.450–5.330)

## 2023-05-07 LAB — SURGICAL PATHOLOGY SPECIMEN

## 2023-05-07 LAB — MAGNESIUM: MAGNESIUM: 1.8 mg/dL — ABNORMAL LOW (ref 1.9–2.7)

## 2023-05-07 MED ORDER — MEPERIDINE (PF) 25 MG/ML INJECTION SOLUTION
12.5000 mg | Freq: Once | INTRAMUSCULAR | Status: DC | PRN
Start: 2023-05-07 — End: 2023-05-08

## 2023-05-07 MED ORDER — ONDANSETRON HCL 8 MG TABLET
8.0000 mg | ORAL_TABLET | Freq: Once | ORAL | Status: DC | PRN
Start: 2023-05-07 — End: 2023-05-08

## 2023-05-07 MED ORDER — DIPHENHYDRAMINE 50 MG/ML INJECTION SOLUTION
25.0000 mg | Freq: Once | INTRAMUSCULAR | Status: DC | PRN
Start: 2023-05-07 — End: 2023-05-08

## 2023-05-07 MED ORDER — SODIUM CHLORIDE 0.9 % INTRAVENOUS SOLUTION
200.0000 mg | Freq: Once | INTRAVENOUS | Status: AC
Start: 2023-05-07 — End: 2023-05-07
  Administered 2023-05-07: 0 mg via INTRAVENOUS
  Administered 2023-05-07: 200 mg via INTRAVENOUS
  Filled 2023-05-07: qty 8

## 2023-05-07 MED ORDER — DEXTROSE 5% IN WATER (D5W) FLUSH BAG - 250 ML
INTRAVENOUS | Status: DC | PRN
Start: 2023-05-07 — End: 2023-05-08

## 2023-05-07 MED ORDER — DIPHENHYDRAMINE 50 MG/ML INJECTION SOLUTION
50.0000 mg | Freq: Once | INTRAMUSCULAR | Status: DC | PRN
Start: 2023-05-07 — End: 2023-05-08

## 2023-05-07 MED ORDER — EPINEPHRINE 1 MG/ML (1 ML) INJECTION SOLUTION
0.3000 mg | Freq: Once | INTRAMUSCULAR | Status: DC | PRN
Start: 2023-05-07 — End: 2023-05-08

## 2023-05-07 MED ORDER — HYDROCORTISONE SOD SUCCINATE 100 MG/2 ML VIAL WRAPPER
100.0000 mg | Freq: Once | INTRAMUSCULAR | Status: DC | PRN
Start: 2023-05-07 — End: 2023-05-08

## 2023-05-07 MED ORDER — ALBUTEROL SULFATE HFA 90 MCG/ACTUATION AEROSOL INHALER - RN
2.0000 | Freq: Once | RESPIRATORY_TRACT | Status: DC | PRN
Start: 2023-05-07 — End: 2023-05-08

## 2023-05-07 MED ORDER — SODIUM CHLORIDE 0.9% FLUSH BAG - 250 ML
INTRAVENOUS | Status: DC | PRN
Start: 2023-05-07 — End: 2023-05-08

## 2023-05-07 MED ORDER — FAMOTIDINE (PF) 20 MG/2 ML INTRAVENOUS SOLUTION
20.0000 mg | Freq: Once | INTRAVENOUS | Status: DC | PRN
Start: 2023-05-07 — End: 2023-05-08

## 2023-05-07 MED ORDER — ONDANSETRON HCL (PF) 4 MG/2 ML INJECTION SOLUTION
8.0000 mg | Freq: Once | INTRAMUSCULAR | Status: DC | PRN
Start: 2023-05-07 — End: 2023-05-08

## 2023-05-07 MED ORDER — ALBUTEROL SULFATE 2.5 MG/3 ML (0.083 %) SOLUTION FOR NEBULIZATION
2.5000 mg | INHALATION_SOLUTION | Freq: Once | RESPIRATORY_TRACT | Status: DC | PRN
Start: 2023-05-07 — End: 2023-05-08

## 2023-05-07 NOTE — Cancer Center Note (Signed)
Department of Hematology/Oncology  Return Patient Visit    Name: Taylor Smith  EPP:[id number]  Date of Birth: April 06, 1971  Encounter Date: 05/07/2023    REFERRING PROVIDER:  Macario Golds, FNP-C  [address]  [place],  [place] [address]      REASON FOR OFFICE VISIT:  management of stage IV clear cell renal carcinoma    HISTORY OF PRESENT ILLNESS:A  Taylor Smith is a 53 y.o. 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       04/16/2023: The patient is here for follow up of metastatic renal cell cancer.  She is doing well and denies any new problems at this time other than some discomfort in her right hand because she states that she has always receiving her infusions in that same location.      05/06/22:      Oncology History   Kidney cancer, primary, with metastasis from kidney to other site, left (CMS [facility])   01/06/2022 Biopsy/Results    Vaginal polyp, biopsy: Clear cell carcinoma, present at biopsy margin     01/13/2022 Initial Diagnosis    Kidney cancer, primary, with metastasis from kidney to other site, left (CMS HCC)     01/17/2022 - 04/11/2022 Chemotherapy Regimen    Plan: IPILIMUMAB (1 MG/KG) + NIVOLUMAB (3 MG/KG) EVERY 3 WEEK Start Date: 01/17/2022         10/09/2022 Biopsy/Results         11/19/2022 -  Chemotherapy Regimen    Plan: PEMBROLIZUMAB EVERY 3 WEEKS Start Date: 11/19/2022         03/09/2023 Imaging    CT CAP  NO NEW PULMONARY OR PLEURAL FINDINGS. SIGNIFICANT SUCCESSFUL RESPONSE OF THE TREATMENT FOR PULMONARY NODULES     NO NEW FINDING AT THE LEFT RENAL FOSSA AFTER NEPHRECTOMY     SMALLER LEFT INGUINAL NODES COMPARED TO THE OLD EXAM FROM DECEMBER 2023 WITH NO DEFINITE NEW ADENOPATHY     INDETERMINATE APPEARANCE OF THE GASTRIC FUNDUS PROBABLY DUE TO INCOMPLETE DISTENTION. CORRELATE WITH ENDOSCOPY IF NOT RECENTLY DONE     INCIDENTAL FINDINGS AS DETAILED ABOVE            ROS:   Review of Systems   Constitutional:  Negative for appetite change, chills and fatigue.   HENT:    Negative for sore throat and trouble swallowing.    Eyes:  Negative for eye problems.   Respiratory:  Negative for cough and shortness of breath.    Cardiovascular:  Negative for chest pain and leg swelling.   Gastrointestinal:  Negative for abdominal pain.   Genitourinary:  Negative for dysuria and hematuria.    Musculoskeletal:  Negative for arthralgias and gait problem.   Skin:  Negative for rash.   Neurological:  Negative for gait problem.   Hematological:  Negative for adenopathy.   Psychiatric/Behavioral:  Negative for depression.         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)     Kidney cancer, primary, with metastasis from kidney to other site, left (CMS [facility])          Past Surgical History:   Procedure Laterality Date    HX CESAREAN SECTION      HX CHOLECYSTECTOMY      HX CYST  REMOVAL Right     HX HYSTERECTOMY      NEPHROURETERECTOMY Left     TOTAL THYROIDECTOMY           Social History     Socioeconomic History    Marital status: Married     Spouse name: Not on file    Number of children: Not on file    Years of education: Not on file    Highest education level: Not on file   Occupational History    Not on file   Tobacco Use    Smoking status: Never    Smokeless tobacco: Never   Vaping Use    Vaping status: Never Used   Substance and Sexual Activity    Alcohol use: Never    Drug use: Never    Sexual activity: Yes     Partners: Male   Other Topics Concern    Not on file   Social History Narrative    Not on file     Social Determinants of Health     Financial Resource Strain: Not on file   Transportation Needs: No Transportation Needs (10/10/2022)    [facility], [facility]    PRAPARE - Transportation     In the past 12 months, has lack of transportation kept you from medical appointments or  from getting medications?: No     Lack of Transportation (Non-Medical): No   Social Connections: Not on file   Intimate Partner Violence: High Risk (01/13/2022)    Intimate Partner Violence     SDOH Domestic Violence: No   Housing Stability: Not on file     Family Medical History:       Problem Relation (Age of Onset)    Cancer Maternal Aunt, Maternal Grandmother    Colon Cancer Maternal Uncle    Lung Cancer Mother    Melanoma Maternal Aunt            Current Outpatient Medications   Medication Sig    ergocalciferol, vitamin D2, (DRISDOL) 1,250 mcg (50,000 unit) Oral Capsule Take 1 Capsule (50,000 Units total) by mouth Every 7 days    gabapentin (NEURONTIN) 300 mg Oral Capsule Take 1 Capsule (300 mg total) by mouth Once a day (Patient taking differently: Take 1 Capsule (300 mg total) by mouth Once a day On hold for now)    Ibuprofen (MOTRIN) 600 mg Oral Tablet Take 1 Tablet (600 mg total) by mouth Every 6 hours as needed    levothyroxine (SYNTHROID) 150 mcg Oral Tablet Take 1 Tablet (150 mcg total) by mouth Every morning    omeprazole (PRILOSEC) 40 mg Oral Capsule, Delayed Release(E.C.) Take 1 Capsule (40 mg total) by mouth Once a day    pembrolizumab (KEYTRUDA) 25 mg/mL Intravenous Solution Infuse 8 mL (200 mg total) into a venous catheter Every 21 days    valsartan (DIOVAN) 40 mg Oral Tablet Take 2 Tablets (80 mg total) by mouth Twice daily     Allergies   Allergen Reactions    Aldactone [Spironolactone] Itching       PHYSICAL EXAM:  BP (!) 146/93 (Site: Right Arm, Patient Position: Sitting, Cuff Size: Adult)   Pulse 83   Temp 36.9 C (98.4 F) (Temporal)   Ht 1.753 m (5\' 9" )   Wt 102 kg (223 lb 14.4 oz)   SpO2 95%   BMI 33.06 kg/m        ECOG Status: (0) Fully active, able to  carry on all predisease performance without restriction   Physical Exam  Constitutional:       General: She is not in acute distress.     Appearance: Normal appearance.   Eyes:      Extraocular Movements: Extraocular movements intact.    Cardiovascular:      Rate and Rhythm: Normal rate and regular rhythm.   Pulmonary:      Effort: Pulmonary effort is normal.   Abdominal:      General: Abdomen is flat.      Palpations: Abdomen is soft.   Musculoskeletal:         General: Normal range of motion.      Cervical back: Normal range of motion.   Skin:     General: Skin is warm and dry.   Neurological:      General: No focal deficit present.      Mental Status: She is alert.   Psychiatric:         Mood and Affect: Mood normal.         DIAGNOSTIC DATA:  No results found for this or any previous visit (from the past 25366 hour(s)).    LABS:   CBC  Diff   Lab Results   Component Value Date/Time    WBC 4.3 05/07/2023 08:11 AM    HGB 14.7 (H) 05/07/2023 08:11 AM    HCT 42.8 (H) 05/07/2023 08:11 AM    PLTCNT 142 05/07/2023 08:11 AM    RBC 4.83 05/07/2023 08:11 AM    MCV 88.6 05/07/2023 08:11 AM    MCHC 34.3 05/07/2023 08:11 AM    MCH 30.4 05/07/2023 08:11 AM    RDW 13.2 05/07/2023 08:11 AM    MPV 10.0 05/07/2023 08:11 AM    Lab Results   Component Value Date/Time    PMNS 61 05/07/2023 08:11 AM    LYMPHOCYTES 27 05/07/2023 08:11 AM    EOSINOPHIL 4 05/07/2023 08:11 AM    MONOCYTES 7 05/07/2023 08:11 AM    BASOPHILS 1 05/07/2023 08:11 AM    BASOPHILS 0.00 05/07/2023 08:11 AM    PMNABS 2.60 05/07/2023 08:11 AM    LYMPHSABS 1.20 05/07/2023 08:11 AM    EOSABS 0.20 05/07/2023 08:11 AM    MONOSABS 0.30 05/07/2023 08:11 AM            Comprehensive Metabolic Profile    Lab Results   Component Value Date    SODIUM 139 05/07/2023    POTASSIUM 3.9 05/07/2023    CHLORIDE 106 05/07/2023    CO2 23 05/07/2023    ANIONGAP 10 05/07/2023    BUN 7 05/07/2023    CREATININE 1.13 05/07/2023    ALBUMIN 4.3 05/07/2023    CALCIUM 9.5 05/07/2023    GLUCOSENF 126 (H) 05/07/2023    ALKPHOS 73 05/07/2023    ALT 25 05/07/2023    AST 26 05/07/2023    TOTBILIRUBIN 0.7 05/07/2023    TOTALPROTEIN 7.6 05/07/2023         ESTIMATED GFR   Date Value Ref Range Status   05/07/2023 58 (L) >59  mL/min/1.29m^2 Final             ASSESSMENT:    ICD-10-CM    1. Kidney cancer, primary, with metastasis from kidney to other site, left (CMS HCC)  C64.2       2. Abnormal findings on diagnostic imaging of digestive system  R93.3       3. Encounter for immunotherapy  Z29.89  PLAN:   1. All relevant medical records were reviewed including available pertinent provider notes, procedure notes, imaging, laboratory, and pathology.   2. All pertinent labs and/or imaging were reviewed with the patient.   3. Stage IV renal carcinoma:  She has had a great response to treatment, but has known pulmonary metastases.  She is tolerating treatment well and her scans have looked excellent.  We will continue with treatment unchanged.  We discussed that two years of therapy is considered reasonable in the setting of metastatic disease.  She will have treatment today.   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  5. Pain:  Improved.    6. Medication review completed in room by myself.  Advance directives information discussed with patient and declines at this time.     KWANITA ORMAN was given the chance to ask questions, and these were answered to their satisfaction. The patient is welcome to call with any questions or concerns in the meantime.     On the day of the encounter, a total of 31 minutes was spent on this patient encounter including review of historical information, examination, documentation and post-visit activities.   Return in about 6 weeks (around 06/18/2023).     Marvene Staff APRN, FNP-BC, AOCNP, 05/07/2023 , 09:06   You can see your note(s) in [facility]. It is common for you to encounter certain medical terminology which may be unfamiliar to you. You might see results before your provider does so please give at least 2 business days for review. Please have this understanding, that NOT all abnormal results are significant. Our office will contact you for any urgent or emergent action if necessary. If you  have any questions or concerns, feel free to send a MyChart message or call the office. Please call with any new or concerning symptoms.     The patient's insurance company bears full legal and financial responsibility resulting from any deviations they cause to my recommended treatment plan.  CC:  Macario Golds, FNP-C  [address]  [place] [address]    Macario Golds, FNP-C  [address]  [place],  [place] [address]    This note was partially generated using MModal Fluency Direct system, and there may be some incorrect words, spellings, and punctuation that were not noted in checking the note before saving.

## 2023-05-07 NOTE — Nurses Notes (Signed)
0902: Patient arrived ambulatory. VSS. Assessments WNL. No complaints voiced.Has already seen provider and had labs done. Evans Lance, RN  [phone]: IV access established in [facility].Evans Lance, RN  [phone]: 200 mg Keytruda infused over 30 minutes per pharmacy.Evans Lance, RN  [phone]: Infusion and flush complete. VSS. IV removed with catheter intact. Evans Lance, RN  1020: Patient left unit ambulatory in no new reported distress. Evans Lance, RN

## 2023-05-16 ENCOUNTER — Other Ambulatory Visit (INDEPENDENT_AMBULATORY_CARE_PROVIDER_SITE_OTHER): Payer: Self-pay | Admitting: NURSE PRACTITIONER

## 2023-05-25 ENCOUNTER — Encounter (INDEPENDENT_AMBULATORY_CARE_PROVIDER_SITE_OTHER): Payer: Self-pay | Admitting: HEMATOLOGY-ONCOLOGY

## 2023-05-26 ENCOUNTER — Other Ambulatory Visit (INDEPENDENT_AMBULATORY_CARE_PROVIDER_SITE_OTHER): Payer: Self-pay | Admitting: HEMATOLOGY-ONCOLOGY

## 2023-05-28 ENCOUNTER — Other Ambulatory Visit: Payer: Self-pay

## 2023-05-28 ENCOUNTER — Ambulatory Visit
Admission: RE | Admit: 2023-05-28 | Discharge: 2023-05-28 | Disposition: A | Discharge status: Home / Self Care | Payer: 59 | Source: Ambulatory Visit | Attending: HEMATOLOGY-ONCOLOGY | Admitting: HEMATOLOGY-ONCOLOGY

## 2023-05-28 VITALS — BP 142/88 | HR 61 | Temp 97.9°F | Resp 20 | Ht 69.0 in | Wt 220.0 lb

## 2023-05-28 DIAGNOSIS — Z5112 Encounter for antineoplastic immunotherapy: Secondary | ICD-10-CM | POA: Insufficient documentation

## 2023-05-28 DIAGNOSIS — C642 Malignant neoplasm of left kidney, except renal pelvis: Secondary | ICD-10-CM | POA: Insufficient documentation

## 2023-05-28 LAB — COMPREHENSIVE METABOLIC PANEL, NON-FASTING
ALBUMIN/GLOBULIN RATIO: 1.3 (ref 0.8–1.4)
ALBUMIN: 4.2 g/dL (ref 3.5–5.7)
ALKALINE PHOSPHATASE: 71 U/L (ref 34–104)
ALT (SGPT): 21 U/L (ref 7–52)
ANION GAP: 9 mmol/L (ref 4–13)
AST (SGOT): 23 U/L (ref 13–39)
BILIRUBIN TOTAL: 0.6 mg/dL (ref 0.3–1.0)
BUN/CREA RATIO: 11 (ref 6–22)
BUN: 12 mg/dL (ref 7–25)
CALCIUM, CORRECTED: 9.4 mg/dL (ref 8.9–10.8)
CALCIUM: 9.6 mg/dL (ref 8.6–10.3)
CHLORIDE: 108 mmol/L — ABNORMAL HIGH (ref 98–107)
CO2 TOTAL: 21 mmol/L (ref 21–31)
CREATININE: 1.1 mg/dL (ref 0.60–1.30)
ESTIMATED GFR: 60 mL/min/{1.73_m2} (ref >59)
GLOBULIN: 3.2 (ref 2.0–3.5)
GLUCOSE: 108 mg/dL (ref 74–109)
OSMOLALITY, CALCULATED: 276 mosm/kg (ref 270–290)
POTASSIUM: 3.8 mmol/L (ref 3.5–5.1)
PROTEIN TOTAL: 7.4 g/dL (ref 6.4–8.9)
SODIUM: 138 mmol/L (ref 136–145)

## 2023-05-28 LAB — CBC WITH DIFF
BASOPHIL #: 0 10*3/uL (ref 0.00–0.10)
BASOPHIL %: 1 % (ref 0–1)
EOSINOPHIL #: 0.2 10*3/uL (ref 0.00–0.50)
EOSINOPHIL %: 4 % (ref 1–7)
HCT: 42.7 % — ABNORMAL HIGH (ref 31.2–41.9)
HGB: 14.2 g/dL (ref 10.9–14.3)
LYMPHOCYTE #: 1.2 10*3/uL (ref 1.10–3.10)
LYMPHOCYTE %: 30 % (ref 16–46)
MCH: 29.6 pg (ref 24.7–32.8)
MCHC: 33.3 g/dL (ref 32.3–35.6)
MCV: 89 fL (ref 75.5–95.3)
MONOCYTE #: 0.3 10*3/uL (ref 0.20–0.90)
MONOCYTE %: 8 % (ref 4–11)
MPV: 9.7 fL (ref 7.9–10.8)
NEUTROPHIL #: 2.3 10*3/uL (ref 1.90–8.20)
NEUTROPHIL %: 58 % (ref 43–77)
PLATELETS: 141 10*3/uL (ref 140–440)
RBC: 4.8 10*6/uL (ref 3.63–4.92)
RDW: 13.4 % (ref 12.3–17.7)
WBC: 3.9 10*3/uL (ref 3.8–11.8)

## 2023-05-28 LAB — THYROID STIMULATING HORMONE WITH FREE T4 REFLEX: TSH: 1.372 u[IU]/mL (ref 0.450–5.330)

## 2023-05-28 LAB — MAGNESIUM: MAGNESIUM: 1.8 mg/dL — ABNORMAL LOW (ref 1.9–2.7)

## 2023-05-28 MED ORDER — SODIUM CHLORIDE 0.9% FLUSH BAG - 250 ML
INTRAVENOUS | Status: DC | PRN
Start: 2023-05-28 — End: 2023-05-29

## 2023-05-28 MED ORDER — ALBUTEROL SULFATE 2.5 MG/3 ML (0.083 %) SOLUTION FOR NEBULIZATION
2.5000 mg | INHALATION_SOLUTION | Freq: Once | RESPIRATORY_TRACT | Status: DC | PRN
Start: 2023-05-28 — End: 2023-05-29

## 2023-05-28 MED ORDER — MEPERIDINE (PF) 25 MG/ML INJECTION SOLUTION
12.5000 mg | Freq: Once | INTRAMUSCULAR | Status: DC | PRN
Start: 2023-05-28 — End: 2023-05-29

## 2023-05-28 MED ORDER — DEXTROSE 5% IN WATER (D5W) FLUSH BAG - 250 ML
INTRAVENOUS | Status: DC | PRN
Start: 2023-05-28 — End: 2023-05-29

## 2023-05-28 MED ORDER — SODIUM CHLORIDE 0.9 % INTRAVENOUS SOLUTION
200.0000 mg | Freq: Once | INTRAVENOUS | Status: AC
Start: 2023-05-28 — End: 2023-05-28
  Administered 2023-05-28: 200 mg via INTRAVENOUS
  Administered 2023-05-28: 0 mg via INTRAVENOUS
  Filled 2023-05-28: qty 8

## 2023-05-28 MED ORDER — ALBUTEROL SULFATE HFA 90 MCG/ACTUATION AEROSOL INHALER - RN
2.0000 | Freq: Once | RESPIRATORY_TRACT | Status: DC | PRN
Start: 2023-05-28 — End: 2023-05-29

## 2023-05-28 MED ORDER — EPINEPHRINE 1 MG/ML (1 ML) INJECTION SOLUTION
0.3000 mg | Freq: Once | INTRAMUSCULAR | Status: DC | PRN
Start: 2023-05-28 — End: 2023-05-29

## 2023-05-28 MED ORDER — HYDROCORTISONE SOD SUCCINATE 100 MG/2 ML VIAL WRAPPER
100.0000 mg | Freq: Once | INTRAMUSCULAR | Status: DC | PRN
Start: 2023-05-28 — End: 2023-05-29

## 2023-05-28 MED ORDER — ONDANSETRON HCL (PF) 4 MG/2 ML INJECTION SOLUTION
8.0000 mg | Freq: Once | INTRAMUSCULAR | Status: DC | PRN
Start: 2023-05-28 — End: 2023-05-29

## 2023-05-28 MED ORDER — DIPHENHYDRAMINE 50 MG/ML INJECTION SOLUTION
50.0000 mg | Freq: Once | INTRAMUSCULAR | Status: DC | PRN
Start: 2023-05-28 — End: 2023-05-29

## 2023-05-28 MED ORDER — DIPHENHYDRAMINE 50 MG/ML INJECTION SOLUTION
25.0000 mg | Freq: Once | INTRAMUSCULAR | Status: DC | PRN
Start: 2023-05-28 — End: 2023-05-29

## 2023-05-28 MED ORDER — ONDANSETRON HCL 8 MG TABLET
8.0000 mg | ORAL_TABLET | Freq: Once | ORAL | Status: DC | PRN
Start: 2023-05-28 — End: 2023-05-29

## 2023-05-28 MED ORDER — FAMOTIDINE (PF) 20 MG/2 ML INTRAVENOUS SOLUTION
20.0000 mg | Freq: Once | INTRAVENOUS | Status: DC | PRN
Start: 2023-05-28 — End: 2023-05-29

## 2023-05-28 NOTE — Nurses Notes (Signed)
[id number] Patient came in ambulatory for treatment. Labs drawn peripherally by lab personnel. Iv started. Keytruda given over 30 minutes. Tolerated well. Iv flushed and discontinued. Site clear. Left unit ambulatory with no c/o offered. Lovenia Kim, RN

## 2023-06-10 ENCOUNTER — Encounter (INDEPENDENT_AMBULATORY_CARE_PROVIDER_SITE_OTHER): Payer: Self-pay | Admitting: HEMATOLOGY-ONCOLOGY

## 2023-06-10 ENCOUNTER — Encounter (INDEPENDENT_AMBULATORY_CARE_PROVIDER_SITE_OTHER): Payer: Self-pay | Admitting: NURSE PRACTITIONER

## 2023-06-10 ENCOUNTER — Other Ambulatory Visit (INDEPENDENT_AMBULATORY_CARE_PROVIDER_SITE_OTHER): Payer: Self-pay | Admitting: NURSE PRACTITIONER

## 2023-06-10 DIAGNOSIS — H9209 Otalgia, unspecified ear: Secondary | ICD-10-CM

## 2023-06-10 DIAGNOSIS — J3489 Other specified disorders of nose and nasal sinuses: Secondary | ICD-10-CM

## 2023-06-10 DIAGNOSIS — H938X9 Other specified disorders of ear, unspecified ear: Secondary | ICD-10-CM

## 2023-06-11 ENCOUNTER — Encounter (INDEPENDENT_AMBULATORY_CARE_PROVIDER_SITE_OTHER): Payer: Self-pay | Admitting: HEMATOLOGY-ONCOLOGY

## 2023-06-11 ENCOUNTER — Encounter (INDEPENDENT_AMBULATORY_CARE_PROVIDER_SITE_OTHER): Payer: Self-pay | Admitting: NURSE PRACTITIONER

## 2023-06-11 ENCOUNTER — Ambulatory Visit: Payer: 59 | Attending: NURSE PRACTITIONER | Admitting: NURSE PRACTITIONER

## 2023-06-11 ENCOUNTER — Other Ambulatory Visit: Payer: Self-pay

## 2023-06-11 VITALS — Ht 69.0 in | Wt 215.0 lb

## 2023-06-11 DIAGNOSIS — H6123 Impacted cerumen, bilateral: Secondary | ICD-10-CM | POA: Insufficient documentation

## 2023-06-11 DIAGNOSIS — H9209 Otalgia, unspecified ear: Secondary | ICD-10-CM | POA: Insufficient documentation

## 2023-06-11 DIAGNOSIS — J0120 Acute ethmoidal sinusitis, unspecified: Secondary | ICD-10-CM | POA: Insufficient documentation

## 2023-06-11 DIAGNOSIS — H938X1 Other specified disorders of right ear: Secondary | ICD-10-CM

## 2023-06-11 DIAGNOSIS — H938X9 Other specified disorders of ear, unspecified ear: Secondary | ICD-10-CM | POA: Insufficient documentation

## 2023-06-11 MED ORDER — AMOXICILLIN 875 MG TABLET
875.0000 mg | ORAL_TABLET | Freq: Two times a day (BID) | ORAL | 0 refills | Status: AC
Start: 2023-06-11 — End: ?

## 2023-06-11 NOTE — H&P (Signed)
 ENT, [facility]  [address]  [place] [address]  Phone: [phone]  Fax: [phone]      Encounter Date: 06/11/2023    Patient ID: Taylor Smith  MRN: [id number]    DOB: [birth year]  Age: 53 y.o. female         Referring Provider:    Macario Golds, FNP-C  [address]  [place],  [place] [address]    Reason for Visit:   Chief Complaint   Patient presents with    Ear Problem(s)     Patient complains of fullness/decreased hearing R ear x 2 weeks     Sinus Problem     Pt complains of sinus pressure, sneezing and some pnd        History of Present Illness:  Taylor Smith is a 53 y.o. female complains of hearing loss right ear for several weeks and total loss since yesterday.  She also has facial pressure and sneezing for a few weeks.      Patient History:  Patient Active Problem List   Diagnosis    Vitamin D deficiency    Hypothyroid    GERD (gastroesophageal reflux disease)    Primary hypertension    Neural foraminal stenosis of cervical spine    Osteophyte of cervical spine    S/P hysterectomy    Kidney cancer, primary, with metastasis from kidney to other site, left (CMS HCC)    Shortness of breath     Current Outpatient Medications   Medication Sig    amoxicillin (AMOXIL) 875 mg Oral Tablet Take 1 Tablet (875 mg total) by mouth Twice daily    ergocalciferol, vitamin D2, (DRISDOL) 1,250 mcg (50,000 unit) Oral Capsule TAKE 1 CAPSULE BY MOUTH ONE TIME PER WEEK    gabapentin (NEURONTIN) 300 mg Oral Capsule Take 1 Capsule (300 mg total) by mouth Once a day    Ibuprofen (MOTRIN) 600 mg Oral Tablet Take 1 Tablet (600 mg total) by mouth Every 6 hours as needed    levothyroxine (SYNTHROID) 150 mcg Oral Tablet TAKE 1 TABLET BY MOUTH EVERY MORNING.    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)     Kidney cancer, primary, with metastasis from kidney to other site, left (CMS [facility])       Past Surgical History:   Procedure Laterality Date    HX CESAREAN SECTION      HX CHOLECYSTECTOMY      HX CYST REMOVAL Right     HX HYSTERECTOMY      NEPHROURETERECTOMY Left     TOTAL THYROIDECTOMY        Family Medical History:       Problem Relation (Age of Onset)    Cancer Maternal Aunt, Maternal Grandmother    Colon Cancer Maternal Uncle    Lung Cancer Mother    Melanoma Maternal  Aunt            Social History     Tobacco Use    Smoking status: Never    Smokeless tobacco: Never   Vaping Use    Vaping status: Never Used   Substance Use Topics    Alcohol use: Never    Drug use: Never       Review of Systems     Vitals:    06/11/23 0928   Weight: 97.5 kg (215 lb)   Height: 1.753 m (5\' 9" )   BMI: 31.75      ENT Physical Exam  Constitutional  Appearance: patient appears well-developed, well-nourished and well-groomed,  Communication/Voice: communication appropriate for developmental age; vocal quality normal;  Head and Face  Appearance: head appears normal, face appears normal and face appears atraumatic;  Palpation: facial palpation normal;  Salivary: glands normal;  Ear  Hearing: intact;  Auricles: right auricle normal; left auricle normal;  External Mastoids: right external mastoid normal; left external mastoid normal;  Ear Canals: right ear canal normal; left ear canal normal;  Tympanic Membranes: right tympanic membrane normal; left tympanic membrane normal;  Nose  External Nose: nares patent bilaterally; external nose normal;  Internal Nose: nasal mucosa normal; septum normal; bilateral  inferior turbinates normal;  Oral Cavity/Oropharynx  Lips: normal;  Teeth: normal;  Gums: gingiva normal;  Tongue: normal;  Oral mucosa: normal;  Hard palate: normal;  Neck  Neck: neck normal; neck palpation normal;  Thyroid: thyroid normal;  Respiratory  Inspection: breathing unlabored; normal breathing rate;  Lymphatic  Palpation: lymph nodes normal;  Neurovestibular  Mental Status: alert and oriented;  Psychiatric: mood normal; affect is appropriate;  Cranial Nerves: cranial nerves intact;       Assessment:  ENCOUNTER DIAGNOSES     ICD-10-CM   1. Bilateral impacted cerumen  H61.23   2. Otalgia, unspecified laterality  H92.09   3. Acute non-recurrent ethmoidal sinusitis  J01.20   4. Sensation of fullness in ear, unspecified laterality  H93.8X9       Plan:  Medical records reviewed on 06/11/2023.  Cerumen impaction removed AU  Nasal endoscopy completed and mucopus from ethmoids.  Start amoxicillin 875 mg BID for 10 days.  Saline nasal spray bid and prn    Orders Placed This Encounter    [address] - NASAL ENDOSCOPY DIAGNOSTIC UNILATERAL OR BILATERAL (AMB ONLY)    [address] - REMOVAL IMPACTED CERUMEN W/ INSTRUMENT, UNILATERAL (AMB ONLY-PD)    amoxicillin (AMOXIL) 875 mg Oral Tablet     No follow-ups on file.    Elnora [place], FNP-BC  06/11/2023, 09:52

## 2023-06-16 ENCOUNTER — Other Ambulatory Visit (INDEPENDENT_AMBULATORY_CARE_PROVIDER_SITE_OTHER): Payer: Self-pay | Admitting: HEMATOLOGY-ONCOLOGY

## 2023-06-16 ENCOUNTER — Encounter (INDEPENDENT_AMBULATORY_CARE_PROVIDER_SITE_OTHER): Payer: Self-pay | Admitting: HEMATOLOGY-ONCOLOGY

## 2023-06-18 ENCOUNTER — Encounter (INDEPENDENT_AMBULATORY_CARE_PROVIDER_SITE_OTHER): Payer: Self-pay | Admitting: NURSE PRACTITIONER

## 2023-06-18 ENCOUNTER — Ambulatory Visit (INDEPENDENT_AMBULATORY_CARE_PROVIDER_SITE_OTHER)
Admission: RE | Admit: 2023-06-18 | Discharge: 2023-06-18 | Disposition: A | Discharge status: Home / Self Care | Payer: 59 | Source: Ambulatory Visit | Attending: NURSE PRACTITIONER | Admitting: NURSE PRACTITIONER

## 2023-06-18 ENCOUNTER — Encounter (HOSPITAL_COMMUNITY): Payer: Self-pay

## 2023-06-18 ENCOUNTER — Ambulatory Visit
Admission: RE | Admit: 2023-06-18 | Discharge: 2023-06-18 | Disposition: A | Discharge status: Home / Self Care | Payer: 59 | Source: Ambulatory Visit | Attending: NURSE PRACTITIONER | Admitting: NURSE PRACTITIONER

## 2023-06-18 ENCOUNTER — Ambulatory Visit (HOSPITAL_BASED_OUTPATIENT_CLINIC_OR_DEPARTMENT_OTHER): Payer: 59 | Admitting: NURSE PRACTITIONER

## 2023-06-18 ENCOUNTER — Other Ambulatory Visit: Payer: Self-pay

## 2023-06-18 VITALS — BP 138/89 | HR 67 | Temp 97.6°F | Ht 69.0 in | Wt 231.4 lb

## 2023-06-18 VITALS — BP 142/97 | HR 62 | Temp 97.2°F | Resp 20

## 2023-06-18 DIAGNOSIS — Z79899 Other long term (current) drug therapy: Secondary | ICD-10-CM | POA: Insufficient documentation

## 2023-06-18 DIAGNOSIS — Z9889 Other specified postprocedural states: Secondary | ICD-10-CM | POA: Insufficient documentation

## 2023-06-18 DIAGNOSIS — C7801 Secondary malignant neoplasm of right lung: Secondary | ICD-10-CM | POA: Insufficient documentation

## 2023-06-18 DIAGNOSIS — Z86718 Personal history of other venous thrombosis and embolism: Secondary | ICD-10-CM | POA: Insufficient documentation

## 2023-06-18 DIAGNOSIS — Z905 Acquired absence of kidney: Secondary | ICD-10-CM | POA: Insufficient documentation

## 2023-06-18 DIAGNOSIS — Z8544 Personal history of malignant neoplasm of other female genital organs: Secondary | ICD-10-CM | POA: Insufficient documentation

## 2023-06-18 DIAGNOSIS — Z7901 Long term (current) use of anticoagulants: Secondary | ICD-10-CM | POA: Insufficient documentation

## 2023-06-18 DIAGNOSIS — C642 Malignant neoplasm of left kidney, except renal pelvis: Secondary | ICD-10-CM | POA: Insufficient documentation

## 2023-06-18 DIAGNOSIS — Z2989 Encounter for other specified prophylactic measures: Secondary | ICD-10-CM

## 2023-06-18 DIAGNOSIS — R0602 Shortness of breath: Secondary | ICD-10-CM | POA: Insufficient documentation

## 2023-06-18 DIAGNOSIS — Z5112 Encounter for antineoplastic immunotherapy: Secondary | ICD-10-CM | POA: Insufficient documentation

## 2023-06-18 DIAGNOSIS — Z79620 Long term (current) use of immunosuppressive biologic: Secondary | ICD-10-CM | POA: Insufficient documentation

## 2023-06-18 LAB — CBC WITH DIFF
BASOPHIL #: 0 10*3/uL (ref 0.00–0.10)
BASOPHIL %: 1 % (ref 0–1)
EOSINOPHIL #: 0.2 10*3/uL (ref 0.00–0.50)
EOSINOPHIL %: 4 % (ref 1–7)
HCT: 43.2 % — ABNORMAL HIGH (ref 31.2–41.9)
HGB: 14.7 g/dL — ABNORMAL HIGH (ref 10.9–14.3)
LYMPHOCYTE #: 1.2 10*3/uL (ref 1.10–3.10)
LYMPHOCYTE %: 27 % (ref 16–46)
MCH: 29.9 pg (ref 24.7–32.8)
MCHC: 33.9 g/dL (ref 32.3–35.6)
MCV: 88 fL (ref 75.5–95.3)
MONOCYTE #: 0.3 10*3/uL (ref 0.20–0.90)
MONOCYTE %: 8 % (ref 4–11)
MPV: 10.1 fL (ref 7.9–10.8)
NEUTROPHIL #: 2.6 10*3/uL (ref 1.90–8.20)
NEUTROPHIL %: 60 % (ref 43–77)
PLATELETS: 149 10*3/uL (ref 140–440)
RBC: 4.91 10*6/uL (ref 3.63–4.92)
RDW: 13.3 % (ref 12.3–17.7)
WBC: 4.3 10*3/uL (ref 3.8–11.8)

## 2023-06-18 LAB — COMPREHENSIVE METABOLIC PANEL, NON-FASTING
ALBUMIN/GLOBULIN RATIO: 1.3 (ref 0.8–1.4)
ALBUMIN: 4.3 g/dL (ref 3.5–5.7)
ALKALINE PHOSPHATASE: 79 U/L (ref 34–104)
ALT (SGPT): 22 U/L (ref 7–52)
ANION GAP: 9 mmol/L (ref 4–13)
AST (SGOT): 25 U/L (ref 13–39)
BILIRUBIN TOTAL: 0.7 mg/dL (ref 0.3–1.0)
BUN/CREA RATIO: 12 (ref 6–22)
BUN: 14 mg/dL (ref 7–25)
CALCIUM, CORRECTED: 9.2 mg/dL (ref 8.9–10.8)
CALCIUM: 9.4 mg/dL (ref 8.6–10.3)
CHLORIDE: 104 mmol/L (ref 98–107)
CO2 TOTAL: 26 mmol/L (ref 21–31)
CREATININE: 1.21 mg/dL (ref 0.60–1.30)
ESTIMATED GFR: 53 mL/min/{1.73_m2} — ABNORMAL LOW (ref >59)
GLOBULIN: 3.3 (ref 2.0–3.5)
GLUCOSE: 110 mg/dL — ABNORMAL HIGH (ref 74–109)
OSMOLALITY, CALCULATED: 279 mosm/kg (ref 270–290)
POTASSIUM: 3.8 mmol/L (ref 3.5–5.1)
PROTEIN TOTAL: 7.6 g/dL (ref 6.4–8.9)
SODIUM: 139 mmol/L (ref 136–145)

## 2023-06-18 LAB — MAGNESIUM: MAGNESIUM: 1.9 mg/dL (ref 1.9–2.7)

## 2023-06-18 LAB — THYROID STIMULATING HORMONE WITH FREE T4 REFLEX: TSH: 3.339 u[IU]/mL (ref 0.450–5.330)

## 2023-06-18 MED ORDER — ONDANSETRON HCL (PF) 4 MG/2 ML INJECTION SOLUTION
8.0000 mg | Freq: Once | INTRAMUSCULAR | Status: DC | PRN
Start: 2023-06-18 — End: 2023-06-19

## 2023-06-18 MED ORDER — DIPHENHYDRAMINE 50 MG/ML INJECTION SOLUTION
50.0000 mg | Freq: Once | INTRAMUSCULAR | Status: DC | PRN
Start: 2023-06-18 — End: 2023-06-19

## 2023-06-18 MED ORDER — ONDANSETRON HCL 8 MG TABLET
8.0000 mg | ORAL_TABLET | Freq: Once | ORAL | Status: DC | PRN
Start: 2023-06-18 — End: 2023-06-19

## 2023-06-18 MED ORDER — ALBUTEROL SULFATE HFA 90 MCG/ACTUATION AEROSOL INHALER - RN
2.0000 | Freq: Once | RESPIRATORY_TRACT | Status: DC | PRN
Start: 2023-06-18 — End: 2023-06-19

## 2023-06-18 MED ORDER — MEPERIDINE (PF) 25 MG/ML INJECTION SOLUTION
12.5000 mg | Freq: Once | INTRAMUSCULAR | Status: DC | PRN
Start: 2023-06-18 — End: 2023-06-19

## 2023-06-18 MED ORDER — DIPHENHYDRAMINE 50 MG/ML INJECTION SOLUTION
25.0000 mg | Freq: Once | INTRAMUSCULAR | Status: DC | PRN
Start: 2023-06-18 — End: 2023-06-19

## 2023-06-18 MED ORDER — SODIUM CHLORIDE 0.9% FLUSH BAG - 250 ML
INTRAVENOUS | Status: DC | PRN
Start: 2023-06-18 — End: 2023-06-19

## 2023-06-18 MED ORDER — EPINEPHRINE 1 MG/ML (1 ML) INJECTION SOLUTION
0.3000 mg | Freq: Once | INTRAMUSCULAR | Status: DC | PRN
Start: 2023-06-18 — End: 2023-06-19

## 2023-06-18 MED ORDER — SODIUM CHLORIDE 0.9 % INTRAVENOUS SOLUTION
200.0000 mg | Freq: Once | INTRAVENOUS | Status: AC
Start: 2023-06-18 — End: 2023-06-18
  Administered 2023-06-18: 200 mg via INTRAVENOUS
  Administered 2023-06-18: 0 mg via INTRAVENOUS
  Filled 2023-06-18: qty 8

## 2023-06-18 MED ORDER — HYDROCORTISONE SOD SUCCINATE 100 MG/2 ML VIAL WRAPPER
100.0000 mg | Freq: Once | INTRAMUSCULAR | Status: DC | PRN
Start: 2023-06-18 — End: 2023-06-19

## 2023-06-18 MED ORDER — FAMOTIDINE (PF) 20 MG/2 ML INTRAVENOUS SOLUTION
20.0000 mg | Freq: Once | INTRAVENOUS | Status: DC | PRN
Start: 2023-06-18 — End: 2023-06-19

## 2023-06-18 MED ORDER — DEXTROSE 5% IN WATER (D5W) FLUSH BAG - 250 ML
INTRAVENOUS | Status: DC | PRN
Start: 2023-06-18 — End: 2023-06-19

## 2023-06-18 MED ORDER — ALBUTEROL SULFATE 2.5 MG/3 ML (0.083 %) SOLUTION FOR NEBULIZATION
2.5000 mg | INHALATION_SOLUTION | Freq: Once | RESPIRATORY_TRACT | Status: DC | PRN
Start: 2023-06-18 — End: 2023-06-19

## 2023-06-18 NOTE — Nurses Notes (Addendum)
 0920 patient arrived to floor ambulatory. Patient here for keytruda infusion. Patient seen by [facility] OP ONC clinic prior to visit with infusion center. Mauri Brooklyn, RN  [phone] assessment completed. Patient states she is tolerating tx well. Immunotherapy fllow sheet completed by Marvene Staff, NP.Mauri [place], RN  Patient Assessment/Symptom Management Patient Has No MD Appointment Today   Key: (+) Symptom present           (-)  Symptom not present If Symptom is Positive(+) a Nursing Note is required   Edema -   Uncontrolled Nausea -   Vomiting -   Inability to eat/drink -   Mouth Sores -   Diarrhea -   Constipation (? Last BM) -   Fatigue that interferes with ADL's -   Numbness/Tingling -change -   Other -   Fever/Signs & Symptoms of infection -   Nurse Initials EB       Patient denies symptoms at this time. Mauri Brooklyn, RN    [phone] IV access obtained. Blood return noted. Mauri Brooklyn, RN  1010 keytruda infusion started. Mauri Brooklyn, RN  1040 keytruda infusion completed. Patient tolerated well. Mauri Brooklyn, RN  1050 IV access removed. Removed. VSS. Mauri Brooklyn, RN  1055 patient left floor ambulatory. Mauri Brooklyn, RN

## 2023-06-18 NOTE — Cancer Center Note (Signed)
 Department of Hematology/Oncology  Return Patient Visit    Name: Taylor Smith  VHQ:[id number]  Date of Birth: [birth year]  Encounter Date: 06/18/2023    REFERRING PROVIDER:  Macario Golds, FNP-C  [address]  [place],  [place] [address]      REASON FOR OFFICE VISIT:  management of stage IV clear cell renal carcinoma    HISTORY OF PRESENT ILLNESS:A  Taylor Smith is a 53 y.o. 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       04/16/2023: The patient is here for follow up of metastatic renal cell cancer.  She is doing well and denies any new problems at this time other than some discomfort in her right hand because she states that she has always receiving her infusions in that same location.      05/06/22:  Patient is here for follow up of metastatic renal cell cancer.  She is doing well but has noticed increase in shortness of breath.  She states that she has had blood drawn off before and that helped.   She denies any increased cough.  She had recent CT scan at [facility] and no changes or issues with lungs were noted.       Immune Related Symptom Assessment  Patient is receiving:: PEMBROLIZUMAB (06/18/23 0800)  Since last visit have you:  Experienced any extreme fatigue?: No (06/18/23 0800)  Experienced any constant or unusual headaches?: No (06/18/23 0800)  Experienced any dizziness or fainting?: No (06/18/23 0800)  Been unusually cold?: No (06/18/23 0800)  Experienced any heart palpitations?: No (06/18/23 0800)  Experienced any chest pain?: No (06/18/23 0800)  Experienced any unintentional weight loss?: No (06/18/23 0800)  Experienced any constant muscle or joint pain?: Yes (in the AM and at night, better during the day) (06/18/23 0800)  Experienced any severe muscle weakness?: No (06/18/23 0800)  Experienced any rash?: No (06/18/23 0800)  Experienced any eyesight changes?: No (06/18/23 0800)  Experienced any new or worsening cough?: No (06/18/23 0800)  Experienced any increased shortness of  breath?: No (06/18/23 0800)  Experienced any loose, watery or foul-smelling stool?: No (06/18/23 0800)  Experienced any pain or tenderness around abdomen?: No (06/18/23 0800)    Oncology History   Kidney cancer, primary, with metastasis from kidney to other site, left (CMS [facility])   01/06/2022 Biopsy/Results    Vaginal polyp, biopsy: Clear cell carcinoma, present at biopsy margin     01/13/2022 Initial Diagnosis    Kidney cancer, primary, with metastasis from kidney to other site, left (CMS HCC)     01/17/2022 - 04/11/2022 Chemotherapy Regimen    Plan: IPILIMUMAB (1 MG/KG) + NIVOLUMAB (3 MG/KG) EVERY 3 WEEK Start Date: 01/17/2022         10/09/2022 Biopsy/Results         11/19/2022 -  Chemotherapy Regimen    Plan: PEMBROLIZUMAB EVERY 3 WEEKS Start Date: 11/19/2022         03/09/2023 Imaging    CT CAP  NO NEW PULMONARY OR PLEURAL FINDINGS. SIGNIFICANT SUCCESSFUL RESPONSE OF THE TREATMENT FOR PULMONARY NODULES     NO NEW FINDING AT THE LEFT RENAL FOSSA AFTER NEPHRECTOMY     SMALLER LEFT INGUINAL NODES COMPARED TO THE OLD EXAM FROM DECEMBER 2023 WITH NO DEFINITE NEW ADENOPATHY     INDETERMINATE APPEARANCE OF THE GASTRIC FUNDUS PROBABLY DUE TO INCOMPLETE DISTENTION. CORRELATE WITH ENDOSCOPY IF NOT RECENTLY DONE     INCIDENTAL FINDINGS AS DETAILED ABOVE  ROS:   Review of Systems   Constitutional:  Negative for appetite change, chills and fatigue.   HENT:   Negative for sore throat and trouble swallowing.    Eyes:  Negative for eye problems.   Respiratory:  Negative for cough and shortness of breath.    Cardiovascular:  Negative for chest pain and leg swelling.   Gastrointestinal:  Negative for abdominal pain.   Genitourinary:  Negative for dysuria and hematuria.    Musculoskeletal:  Negative for arthralgias and gait problem.   Skin:  Negative for rash.   Neurological:  Negative for gait problem.   Hematological:  Negative for adenopathy.   Psychiatric/Behavioral:  Negative for depression.         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)     Kidney cancer, primary, with metastasis from kidney to other site, left (CMS [facility])          Past Surgical History:   Procedure Laterality Date    HX CESAREAN SECTION      HX CHOLECYSTECTOMY      HX CYST REMOVAL Right     HX HYSTERECTOMY      NEPHROURETERECTOMY Left     TOTAL THYROIDECTOMY           Social History     Socioeconomic History    Marital status: Married     Spouse name: Not on file    Number of children: Not on file    Years of education: Not on file    Highest education level: Not on file   Occupational History    Not on file   Tobacco Use    Smoking status: Never    Smokeless tobacco: Never   Vaping Use    Vaping status: Never Used   Substance and Sexual Activity    Alcohol use: Never    Drug use: Never    Sexual activity: Yes     Partners: Male   Other Topics Concern    Not on file   Social History Narrative    Not on file     Social Determinants of Health     Financial Resource Strain: Not on file   Transportation Needs: No Transportation Needs (10/10/2022)    Received from [facility], [facility]    PRAPARE - Transportation     In the past 12 months, has lack of transportation kept you from medical appointments or from getting medications?: No     Lack of Transportation (Non-Medical): No   Social Connections: Not on file   Intimate Partner Violence: High Risk (01/13/2022)    Intimate Partner Violence     SDOH Domestic Violence: No   Housing Stability: Not on file     Family Medical History:       Problem Relation (Age of Onset)    Cancer Maternal Aunt, Maternal Grandmother    Colon Cancer Maternal Uncle    Lung Cancer Mother    Melanoma Maternal Aunt            Current Outpatient Medications   Medication Sig    amoxicillin (AMOXIL) 875 mg Oral Tablet Take 1 Tablet (875 mg  total) by mouth Twice daily    ergocalciferol, vitamin D2, (DRISDOL) 1,250 mcg (50,000 unit) Oral Capsule TAKE  1 CAPSULE BY MOUTH ONE TIME PER WEEK    gabapentin (NEURONTIN) 300 mg Oral Capsule Take 1 Capsule (300 mg total) by mouth Once a day    Ibuprofen (MOTRIN) 600 mg Oral Tablet Take 1 Tablet (600 mg total) by mouth Every 6 hours as needed    levothyroxine (SYNTHROID) 150 mcg Oral Tablet Take 1 Tablet (150 mcg total) by mouth Every morning    omeprazole (PRILOSEC) 40 mg Oral Capsule, Delayed Release(E.C.) Take 1 Capsule (40 mg total) by mouth Once a day    pembrolizumab (KEYTRUDA) 25 mg/mL Intravenous Solution Infuse 8 mL (200 mg total) into a venous catheter Every 21 days    valsartan (DIOVAN) 40 mg Oral Tablet Take 2 Tablets (80 mg total) by mouth Twice daily     Allergies   Allergen Reactions    Aldactone [Spironolactone] Itching       PHYSICAL EXAM:  BP 138/89 (Site: Left Arm, Patient Position: Sitting, Cuff Size: Adult Large)   Pulse 67   Temp 36.4 C (97.6 F) (Temporal)   Ht 1.753 m (5\' 9" )   Wt 105 kg (231 lb 6.4 oz)   SpO2 100%   BMI 34.17 kg/m        ECOG Status: (0) Fully active, able to carry on all predisease performance without restriction   Physical Exam  Constitutional:       General: She is not in acute distress.     Appearance: Normal appearance.   Eyes:      Extraocular Movements: Extraocular movements intact.   Cardiovascular:      Rate and Rhythm: Normal rate and regular rhythm.   Pulmonary:      Effort: Pulmonary effort is normal.   Abdominal:      General: Abdomen is flat.      Palpations: Abdomen is soft.   Musculoskeletal:         General: Normal range of motion.      Cervical back: Normal range of motion.   Skin:     General: Skin is warm and dry.   Neurological:      General: No focal deficit present.      Mental Status: She is alert.   Psychiatric:         Mood and Affect: Mood normal.         DIAGNOSTIC DATA:  CT RCC protocol incl chest w MIPS and dual abd pel w wo  Order:  [id number]  Narrative    Procedure: CT Chest with IV Contrast  Procedure: CT Abdomen without and with IV Contrast  Procedure: CT Pelvis with IV Contrast    Comparison:  12/02/2022    Indication:  Kidney cancer, staging, C78.00 Secondary malignant neoplasm of  unspecified lung (CMS/HHS-HCC), C64.9 Malignant neoplasm of unspecified  kidney, except renal pelvis (CMS/HHS-HCC).    Technique:  CT imaging of the chest, abdomen, and pelvis was performed  following the administration of IV contrast. Imaging was performed through  the kidneys before the administration of contrast, followed by arterial  phase imaging through the liver, and portal venous phase imaging of the  chest, abdomen, and pelvis.   Iodinated contrast was used due to the  indications for the examination, to improve disease detection and to  further define anatomy.   Coronal and sagittal reformatted images of the  abdomen and pelvis were generated and reviewed. 3-D maximum intensity  projection (MIP) reconstructions of the chest were performed to potentially  increase study sensitivity.  Findings:  Chest:    - Chest wall and Thoracic Inlet: No masses or lymphadenopathy.  Thyroidectomy.    - Mediastinum and Hila: No masses or lymphadenopathy.    - Thoracic Vessels: Normal caliber of the thoracic aorta and main pulmonary  artery.    - Heart and Pericardium: Normal heart size.  No pericardial effusion.    - Lungs and Airways: A 0.5 cm right lower lobe pulmonary nodule is  unchanged (series 9, image 59). Other tiny pulmonary nodules are also  unchanged.    - Pleura: No pleural effusions.      Abdomen and pelvis:    - Liver: Normal in morphology and enhancement.  No suspicious hepatic  masses are identified.  The portal and hepatic veins are patent.    - Biliary and Gallbladder: Cholecystectomy. Mild central intrahepatic  biliary ductal dilatation, similar to prior.    - Spleen: Normal in appearance.      - Pancreas: Normal in appearance.    - Adrenal Glands:  Normal right adrenal gland.    - Kidneys: Left nephrectomy. No suspicious renal lesions. No  hydronephrosis.    - Abdominal and Pelvic Vasculature: No abdominal aortic aneurysm.    - Gastrointestinal Tract: No abnormal dilation or wall thickening.    - Peritoneum/Mesentery/Retroperitoneum: No free fluid.  No free  intraperitoneal air.    - Lymph Nodes: No retroperitoneal or mesenteric lymphadenopathy.      - Bladder: Normal in appearance.    - Pelvic Organs: Unremarkable.    - Body Wall: Unremarkable.    - Musculoskeletal:  No aggressive appearing osseous lesions. Previously  seen osseous lesions in T1 and T3 are unchanged.      Impression:  No new or enlarging metastatic disease within the chest, abdomen, or  pelvis.  Tiny pulmonary nodules, unchanged.        LABS:   CBC  Diff   Lab Results   Component Value Date/Time    WBC 4.3 06/18/2023 08:18 AM    HGB 14.7 (H) 06/18/2023 08:18 AM    HCT 43.2 (H) 06/18/2023 08:18 AM    PLTCNT 149 06/18/2023 08:18 AM    RBC 4.91 06/18/2023 08:18 AM    MCV 88.0 06/18/2023 08:18 AM    MCHC 33.9 06/18/2023 08:18 AM    MCH 29.9 06/18/2023 08:18 AM    RDW 13.3 06/18/2023 08:18 AM    MPV 10.1 06/18/2023 08:18 AM    Lab Results   Component Value Date/Time    PMNS 60 06/18/2023 08:18 AM    LYMPHOCYTES 27 06/18/2023 08:18 AM    EOSINOPHIL 4 06/18/2023 08:18 AM    MONOCYTES 8 06/18/2023 08:18 AM    BASOPHILS 1 06/18/2023 08:18 AM    BASOPHILS 0.00 06/18/2023 08:18 AM    PMNABS 2.60 06/18/2023 08:18 AM    LYMPHSABS 1.20 06/18/2023 08:18 AM    EOSABS 0.20 06/18/2023 08:18 AM    MONOSABS 0.30 06/18/2023 08:18 AM            Comprehensive Metabolic Profile    Lab Results   Component Value Date    SODIUM 139 06/18/2023    POTASSIUM 3.8 06/18/2023    CHLORIDE 104 06/18/2023    CO2 26 06/18/2023    ANIONGAP 9 06/18/2023    BUN 14 06/18/2023    CREATININE 1.21 06/18/2023    ALBUMIN 4.3 06/18/2023    CALCIUM 9.4 06/18/2023    GLUCOSENF 110 (H) 06/18/2023    ALKPHOS 79 06/18/2023  ALT 22 06/18/2023     AST 25 06/18/2023    TOTBILIRUBIN 0.7 06/18/2023    TOTALPROTEIN 7.6 06/18/2023         ESTIMATED GFR   Date Value Ref Range Status   06/18/2023 53 (L) >59 mL/min/1.29m^2 Final             ASSESSMENT:    ICD-10-CM    1. Kidney cancer, primary, with metastasis from kidney to other site, left (CMS HCC)  C64.2       2. Shortness of breath  R06.02       3. Encounter for immunotherapy  Z29.89                PLAN:   1. All relevant medical records were reviewed including available pertinent provider notes, procedure notes, imaging, laboratory, and pathology.   2. All pertinent labs and/or imaging were reviewed with the patient.   3. Stage IV renal carcinoma:  She has had a great response to treatment, but has known pulmonary metastases.  She is tolerating treatment well and her scans have looked excellent.  We will continue with treatment unchanged.  We discussed that two years of therapy is considered reasonable in the setting of metastatic disease.  She will have treatment today.   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  5. Pain:  Improved.    6.Shortness of breath:  Patient has had phlebotomy in the past that helped with shortness of breath.  Her Hct is slightly elevated and we discussed possible 250 ml phlebotomy to see if this helps.  She is agreeable to this and consent was signed.     Taylor Smith was given the chance to ask questions, and these were answered to their satisfaction. The patient is welcome to call with any questions or concerns in the meantime.     On the day of the encounter, a total of 30 minutes was spent on this patient encounter including review of historical information, examination, documentation and post-visit activities.   Return in about 6 weeks (around 07/30/2023).     Marvene Staff APRN, FNP-BC, AOCNP, 06/18/2023 , 09:18   You can see your note(s) in [facility]. It is common for you to encounter certain medical terminology which may be unfamiliar to you. You might see results  before your provider does so please give at least 2 business days for review. Please have this understanding, that NOT all abnormal results are significant. Our office will contact you for any urgent or emergent action if necessary. If you have any questions or concerns, feel free to send a MyChart message or call the office. Please call with any new or concerning symptoms.     The patient's insurance company bears full legal and financial responsibility resulting from any deviations they cause to my recommended treatment plan.  CC:  Macario Golds, FNP-C  [address]  [place] [address]    Macario Golds, FNP-C  [address]  [place],  [place] [address]    This note was partially generated using MModal Fluency Direct system, and there may be some incorrect words, spellings, and punctuation that were not noted in checking the note before saving.

## 2023-06-23 ENCOUNTER — Other Ambulatory Visit (INDEPENDENT_AMBULATORY_CARE_PROVIDER_SITE_OTHER): Payer: Self-pay | Admitting: NURSE PRACTITIONER

## 2023-06-24 NOTE — Procedures (Signed)
 [facility], [facility]  [address]  [place] [address]  Operated by [facility]  Procedure Note    Name: Taylor Smith MRN:  [id number]   Date: 06/11/2023 DOB:  05-Mar-1971 (53 y.o.)         69210 - REMOVAL IMPACTED CERUMEN W/ INSTRUMENT, UNILATERAL (AMB ONLY-PD)    Performed by: Elnora Morrison, FNP-BC  Authorized by: Elnora Morrison, FNP-BC    Time Out:     Immediately before the procedure, a time out was called:  Yes    Patient verified:  Yes    Procedure Verified:  Yes    Site Verified:  Yes  Documentation:      Procedure: Cerumen cleaning   Pre-op Dx: Cerumen impaction   Bilateral EACs examined under binocular microscopy. Cerumen and/or debris was cleaned from the canals using curettes, suction, and alligator forceps. Patient tolerated procedure well.        47829 - NASAL ENDOSCOPY DIAGNOSTIC UNILATERAL OR BILATERAL (AMB ONLY)    Performed by: Elnora Morrison, FNP-BC  Authorized by: Elnora Morrison, FNP-BC    Time Out:     Immediately before the procedure, a time out was called:  Yes    Patient verified:  Yes    Procedure Verified:  Yes    Site Verified:  Yes  Documentation:      ENT, [facility]  [address]  [place] [address]  Operated by [facility]  Procedure Note    Name: Taylor Smith MRN:  [id number]  Date: 06/11/2023 DOB:  Nov 07, 1970 (53 y.o.)        @PROCDOC @    Indications for procedure: Obstructive nasal breathing and Facial pain / Headache    Anesthesia: Oxymetazoline nasal spray    Description: Nasal endoscopy with rigid scope was performed with examination of the  septum, inferior, middle, and superior meatus, turbinates, sphenoethmoidal recess, and nasopharynx.     There were no polyps, there is mucopus from ethmoids.  ET orifices and nasopharynx were normal.     Findings: Acute sinusitis    The patient tolerated the procedure well.    Elnora Morrison, FNP-BC             Elnora Morrison, FNP-BC

## 2023-06-25 LAB — SIGNATERA
SIGNATERA MTM READOUT: 0 MTM/ml
SIGNATERA TEST RESULT: NEGATIVE

## 2023-07-02 ENCOUNTER — Encounter (INDEPENDENT_AMBULATORY_CARE_PROVIDER_SITE_OTHER): Payer: Self-pay | Admitting: HEMATOLOGY-ONCOLOGY

## 2023-07-07 ENCOUNTER — Encounter (INDEPENDENT_AMBULATORY_CARE_PROVIDER_SITE_OTHER): Payer: Self-pay | Admitting: HEMATOLOGY-ONCOLOGY

## 2023-07-08 ENCOUNTER — Other Ambulatory Visit (INDEPENDENT_AMBULATORY_CARE_PROVIDER_SITE_OTHER): Payer: Self-pay | Admitting: HEMATOLOGY-ONCOLOGY

## 2023-07-09 ENCOUNTER — Other Ambulatory Visit: Payer: Self-pay

## 2023-07-09 ENCOUNTER — Other Ambulatory Visit (INDEPENDENT_AMBULATORY_CARE_PROVIDER_SITE_OTHER): Payer: Self-pay | Admitting: NURSE PRACTITIONER

## 2023-07-09 ENCOUNTER — Ambulatory Visit
Admission: RE | Admit: 2023-07-09 | Discharge: 2023-07-09 | Disposition: A | Discharge status: Home / Self Care | Payer: 59 | Source: Ambulatory Visit | Attending: HEMATOLOGY-ONCOLOGY | Admitting: HEMATOLOGY-ONCOLOGY

## 2023-07-09 ENCOUNTER — Encounter (HOSPITAL_COMMUNITY): Payer: Self-pay

## 2023-07-09 VITALS — BP 152/97 | HR 62 | Temp 97.8°F | Resp 20

## 2023-07-09 DIAGNOSIS — C642 Malignant neoplasm of left kidney, except renal pelvis: Secondary | ICD-10-CM | POA: Insufficient documentation

## 2023-07-09 DIAGNOSIS — Z5112 Encounter for antineoplastic immunotherapy: Secondary | ICD-10-CM | POA: Insufficient documentation

## 2023-07-09 LAB — CBC WITH DIFF
BASOPHIL #: 0 10*3/uL (ref 0.00–0.10)
BASOPHIL %: 1 % (ref 0–1)
EOSINOPHIL #: 0.1 10*3/uL (ref 0.00–0.50)
EOSINOPHIL %: 3 % (ref 1–7)
HCT: 41.5 % (ref 31.2–41.9)
HGB: 14.3 g/dL (ref 10.9–14.3)
LYMPHOCYTE #: 1.3 10*3/uL (ref 1.10–3.10)
LYMPHOCYTE %: 31 % (ref 16–46)
MCH: 30.3 pg (ref 24.7–32.8)
MCHC: 34.4 g/dL (ref 32.3–35.6)
MCV: 88.2 fL (ref 75.5–95.3)
MONOCYTE #: 0.3 10*3/uL (ref 0.20–0.90)
MONOCYTE %: 9 % (ref 4–11)
MPV: 10 fL (ref 7.9–10.8)
NEUTROPHIL #: 2.3 10*3/uL (ref 1.90–8.20)
NEUTROPHIL %: 56 % (ref 43–77)
PLATELETS: 138 10*3/uL — ABNORMAL LOW (ref 140–440)
RBC: 4.7 10*6/uL (ref 3.63–4.92)
RDW: 13.8 % (ref 12.3–17.7)
WBC: 4.1 10*3/uL (ref 3.8–11.8)

## 2023-07-09 LAB — COMPREHENSIVE METABOLIC PANEL, NON-FASTING
ALBUMIN/GLOBULIN RATIO: 1.5 — ABNORMAL HIGH (ref 0.8–1.4)
ALBUMIN: 4.6 g/dL (ref 3.5–5.7)
ALKALINE PHOSPHATASE: 74 U/L (ref 34–104)
ALT (SGPT): 26 U/L (ref 7–52)
ANION GAP: 7 mmol/L (ref 4–13)
AST (SGOT): 28 U/L (ref 13–39)
BILIRUBIN TOTAL: 0.6 mg/dL (ref 0.3–1.0)
BUN/CREA RATIO: 13 (ref 6–22)
BUN: 16 mg/dL (ref 7–25)
CALCIUM, CORRECTED: 9.2 mg/dL (ref 8.9–10.8)
CALCIUM: 9.7 mg/dL (ref 8.6–10.3)
CHLORIDE: 107 mmol/L (ref 98–107)
CO2 TOTAL: 26 mmol/L (ref 21–31)
CREATININE: 1.21 mg/dL (ref 0.60–1.30)
ESTIMATED GFR: 53 mL/min/{1.73_m2} — ABNORMAL LOW (ref >59)
GLOBULIN: 3.1 (ref 2.0–3.5)
GLUCOSE: 86 mg/dL (ref 74–109)
OSMOLALITY, CALCULATED: 280 mosm/kg (ref 270–290)
POTASSIUM: 4.2 mmol/L (ref 3.5–5.1)
PROTEIN TOTAL: 7.7 g/dL (ref 6.4–8.9)
SODIUM: 140 mmol/L (ref 136–145)

## 2023-07-09 LAB — MAGNESIUM: MAGNESIUM: 2 mg/dL (ref 1.9–2.7)

## 2023-07-09 LAB — THYROID STIMULATING HORMONE WITH FREE T4 REFLEX: TSH: 0.852 u[IU]/mL (ref 0.450–5.330)

## 2023-07-09 MED ORDER — HYDROCORTISONE SOD SUCCINATE 100 MG/2 ML VIAL WRAPPER
100.0000 mg | Freq: Once | INTRAMUSCULAR | Status: DC | PRN
Start: 2023-07-09 — End: 2023-07-10

## 2023-07-09 MED ORDER — FAMOTIDINE (PF) 20 MG/2 ML INTRAVENOUS SOLUTION
20.0000 mg | Freq: Once | INTRAVENOUS | Status: DC | PRN
Start: 2023-07-09 — End: 2023-07-10

## 2023-07-09 MED ORDER — MEPERIDINE (PF) 25 MG/ML INJECTION SOLUTION
12.5000 mg | Freq: Once | INTRAMUSCULAR | Status: DC | PRN
Start: 2023-07-09 — End: 2023-07-10

## 2023-07-09 MED ORDER — DIPHENHYDRAMINE 50 MG/ML INJECTION SOLUTION
50.0000 mg | Freq: Once | INTRAMUSCULAR | Status: DC | PRN
Start: 2023-07-09 — End: 2023-07-10

## 2023-07-09 MED ORDER — DIPHENHYDRAMINE 50 MG/ML INJECTION SOLUTION
25.0000 mg | Freq: Once | INTRAMUSCULAR | Status: DC | PRN
Start: 2023-07-09 — End: 2023-07-10

## 2023-07-09 MED ORDER — ONDANSETRON HCL 8 MG TABLET
8.0000 mg | ORAL_TABLET | Freq: Once | ORAL | Status: DC | PRN
Start: 2023-07-09 — End: 2023-07-10

## 2023-07-09 MED ORDER — DEXTROSE 5% IN WATER (D5W) FLUSH BAG - 250 ML
INTRAVENOUS | Status: DC | PRN
Start: 2023-07-09 — End: 2023-07-10

## 2023-07-09 MED ORDER — ONDANSETRON HCL (PF) 4 MG/2 ML INJECTION SOLUTION
8.0000 mg | Freq: Once | INTRAMUSCULAR | Status: DC | PRN
Start: 2023-07-09 — End: 2023-07-10

## 2023-07-09 MED ORDER — SODIUM CHLORIDE 0.9 % INTRAVENOUS SOLUTION
200.0000 mg | Freq: Once | INTRAVENOUS | Status: AC
Start: 2023-07-09 — End: 2023-07-09
  Administered 2023-07-09: 200 mg via INTRAVENOUS
  Administered 2023-07-09: 0 mg via INTRAVENOUS
  Filled 2023-07-09: qty 8

## 2023-07-09 MED ORDER — EPINEPHRINE 1 MG/ML (1 ML) INJECTION SOLUTION
0.3000 mg | Freq: Once | INTRAMUSCULAR | Status: DC | PRN
Start: 2023-07-09 — End: 2023-07-10

## 2023-07-09 MED ORDER — ALBUTEROL SULFATE 2.5 MG/3 ML (0.083 %) SOLUTION FOR NEBULIZATION
2.5000 mg | INHALATION_SOLUTION | Freq: Once | RESPIRATORY_TRACT | Status: DC | PRN
Start: 2023-07-09 — End: 2023-07-10

## 2023-07-09 MED ORDER — ALBUTEROL SULFATE HFA 90 MCG/ACTUATION AEROSOL INHALER - RN
2.0000 | Freq: Once | RESPIRATORY_TRACT | Status: DC | PRN
Start: 2023-07-09 — End: 2023-07-10

## 2023-07-09 MED ORDER — SODIUM CHLORIDE 0.9% FLUSH BAG - 250 ML
INTRAVENOUS | Status: DC | PRN
Start: 2023-07-09 — End: 2023-07-10

## 2023-07-09 NOTE — Nurses Notes (Signed)
 [id number] patient arrived to floor ambulatory. Patient here for keytruda  infusion. Tad Eye, RN  [phone] assessment completed. Patient states she is tolerating tx well. Immunotherapy fllow sheet completed by Angela Kell, NP.Tad Eye, RN  Patient Assessment/Symptom Management Patient Has No MD Appointment Today   Key: (+) Symptom present           (-)  Symptom not present If Symptom is Positive(+) a Nursing Note is required   Edema -   Uncontrolled Nausea -   Vomiting -   Inability to eat/drink -   Mouth Sores -   Diarrhea + Dr. aware   Constipation (? Last BM) -   Fatigue that interferes with ADL's -   Numbness/Tingling -change -   Other -   Fever/Signs & Symptoms of infection -   Nurse Initials EB       Patient denies symptoms at this time. Tad Eye, RN    [phone] IV access obtained. Blood return noted. Tad Eye, RN  [phone] keytruda  infusion started. Tad Eye, RN  [phone] keytruda  infusion completed. Patient tolerated well. Tad Eye, RN  [phone] IV access removed. Removed. VSS. Tad Eye, RN  [phone] patient left floor ambulatory. Tad Eye, RN

## 2023-07-15 ENCOUNTER — Other Ambulatory Visit: Payer: Self-pay

## 2023-07-15 ENCOUNTER — Emergency Department
Admission: EM | Admit: 2023-07-15 | Discharge: 2023-07-15 | Disposition: A | Discharge status: Home / Self Care | Attending: Family | Admitting: Family

## 2023-07-15 ENCOUNTER — Encounter (HOSPITAL_BASED_OUTPATIENT_CLINIC_OR_DEPARTMENT_OTHER): Payer: Self-pay

## 2023-07-15 ENCOUNTER — Emergency Department (HOSPITAL_BASED_OUTPATIENT_CLINIC_OR_DEPARTMENT_OTHER)

## 2023-07-15 DIAGNOSIS — M2570 Osteophyte, unspecified joint: Secondary | ICD-10-CM

## 2023-07-15 DIAGNOSIS — M25562 Pain in left knee: Secondary | ICD-10-CM | POA: Insufficient documentation

## 2023-07-15 DIAGNOSIS — X501XXA Overexertion from prolonged static or awkward postures, initial encounter: Secondary | ICD-10-CM | POA: Insufficient documentation

## 2023-07-15 DIAGNOSIS — S8392XA Sprain of unspecified site of left knee, initial encounter: Secondary | ICD-10-CM | POA: Insufficient documentation

## 2023-07-15 DIAGNOSIS — M25762 Osteophyte, left knee: Secondary | ICD-10-CM | POA: Insufficient documentation

## 2023-07-15 DIAGNOSIS — M25462 Effusion, left knee: Secondary | ICD-10-CM | POA: Insufficient documentation

## 2023-07-15 MED ORDER — KETOROLAC 30 MG/ML (1 ML) INJECTION SOLUTION
30.0000 mg | INTRAMUSCULAR | Status: AC
Start: 2023-07-15 — End: 2023-07-15
  Administered 2023-07-15: 30 mg via INTRAMUSCULAR

## 2023-07-15 MED ORDER — KETOROLAC 30 MG/ML (1 ML) INJECTION SOLUTION
INTRAMUSCULAR | Status: AC
Start: 2023-07-15 — End: 2023-07-15
  Filled 2023-07-15: qty 1

## 2023-07-15 MED ORDER — METHYLPREDNISOLONE SOD SUCC 125 MG SOLUTION FOR INJECTION WRAPPER
125.0000 mg | INTRAVENOUS | Status: AC
Start: 2023-07-15 — End: 2023-07-15
  Administered 2023-07-15: 125 mg via INTRAMUSCULAR

## 2023-07-15 MED ORDER — METHYLPREDNISOLONE SOD SUCC 125 MG SOLUTION FOR INJECTION WRAPPER
INTRAVENOUS | Status: AC
Start: 2023-07-15 — End: 2023-07-15
  Filled 2023-07-15: qty 2

## 2023-07-15 MED ORDER — MELOXICAM 7.5 MG TABLET
7.5000 mg | ORAL_TABLET | Freq: Every day | ORAL | 0 refills | Status: AC
Start: 2023-07-15 — End: 2023-07-22

## 2023-07-15 NOTE — ED Provider Notes (Signed)
 [facility], [facility] - Emergency Department  ED Primary Provider Note  History of Present Illness   Chief Complaint   Patient presents with    Knee Pain     Patient reports left knee pain, unable to bear weight. Ongoing for a while, worse today. Reports twisted it this am, hyperextended it last week     Arrival: The patient arrived by Car  Taylor Smith is a 53 y.o. female who had concerns including Knee Pain. Pt states left knee pain for a few days in knee cap states feels like it will give out. Hyperextended. Denies calf pain   Review of Systems   Constitutional: No fever, chills or weakness   Skin: No rash or diaphoresis  HENT: No headaches, or congestion  Eyes: No vision changes or photophobia   Cardio: No chest pain, palpitations or leg swelling   Respiratory: No cough, wheezing or SOB  GI:  No nausea, vomiting or stool changes  GU:  No dysuria, hematuria, or increased frequency  MSK: No muscle aches, +joint  pain  Neuro: No seizures, LOC, numbness, tingling, or focal weakness  Psychiatric: No depression, SI or substance abuse  All other systems reviewed and are negative.    History Reviewed This Encounter: all noted and reviewed.     Physical Exam   ED Triage Vitals [07/15/23 1048]   BP (Non-Invasive) (!) 149/97   Heart Rate 61   Respiratory Rate 18   Temperature 36.9 C (98.4 F)   SpO2 99 %   Weight 105 kg (231 lb)   Height 1.753 m (5\' 9" )       Constitutional:  53 y.o. female who appears in no distress. Normal color, no cyanosis.   HENT:   Head: Normocephalic and atraumatic.   Mouth/Throat: Oropharynx is clear and moist.   Eyes: EOMI, PERRL   Neck: Trachea midline. Neck supple.  Cardiovascular: RRR, No murmurs, rubs or gallops. Intact distal pulses.  Pulmonary/Chest: BS equal bilaterally. No respiratory distress. No wheezes, rales or chest tenderness.   Abdominal: Bowel sounds present and normal. Abdomen soft, no tenderness, no rebound and no guarding.  Back: No midline spinal  tenderness, no paraspinal tenderness, no CVA tenderness.           Musculoskeletal: +left patella edema, tenderness neg calf exam no deformity.  Skin: warm and dry. No rash, erythema, pallor or cyanosis  Psychiatric: normal mood and affect. Behavior is normal.   Neurological: Patient keenly alert and responsive, easily able to raise eyebrows, facial muscles/expressions symmetric, speaking in fluent sentences, moving all extremities equally and fully, normal gait    XR KNEE LEFT 4 OR MORE VIEWS   Final Result by Edi, Radresults In (03/26 1151)   NO EFFUSION OR FRACTURE.   MINIMAL DEGENERATIVE CHANGES.                      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  Xray no fx. No effusion spurs noted. Pain relief moderate.          Medications Ordered/Administered in the ED   ketorolac  (TORADOL ) 30 mg/mL injection (30 mg IntraMUSCULAR Given 07/15/23 1151)   methylPREDNISolone  sod succ (SOLU-medrol ) 125 mg/2 mL injection (125 mg IntraMUSCULAR Given 07/15/23 1150)     Clinical Impression   Acute pain of left knee (Primary)   Osteophyte, unspecified joint  Left knee sprain       Disposition: Discharged

## 2023-07-15 NOTE — Discharge Instructions (Signed)
May need mri and physical therapy

## 2023-07-22 ENCOUNTER — Encounter (INDEPENDENT_AMBULATORY_CARE_PROVIDER_SITE_OTHER): Payer: Self-pay | Admitting: HEMATOLOGY-ONCOLOGY

## 2023-07-23 ENCOUNTER — Encounter (INDEPENDENT_AMBULATORY_CARE_PROVIDER_SITE_OTHER): Payer: Self-pay | Admitting: HEMATOLOGY-ONCOLOGY

## 2023-07-29 ENCOUNTER — Encounter (INDEPENDENT_AMBULATORY_CARE_PROVIDER_SITE_OTHER): Payer: Self-pay | Admitting: HEMATOLOGY-ONCOLOGY

## 2023-07-29 ENCOUNTER — Other Ambulatory Visit (INDEPENDENT_AMBULATORY_CARE_PROVIDER_SITE_OTHER): Payer: Self-pay | Admitting: HEMATOLOGY-ONCOLOGY

## 2023-07-30 ENCOUNTER — Encounter (INDEPENDENT_AMBULATORY_CARE_PROVIDER_SITE_OTHER): Payer: Self-pay | Admitting: NURSE PRACTITIONER

## 2023-07-30 ENCOUNTER — Other Ambulatory Visit: Payer: Self-pay

## 2023-07-30 ENCOUNTER — Ambulatory Visit (INDEPENDENT_AMBULATORY_CARE_PROVIDER_SITE_OTHER)
Admission: RE | Admit: 2023-07-30 | Discharge: 2023-07-30 | Disposition: A | Discharge status: Home / Self Care | Payer: Self-pay | Source: Ambulatory Visit | Attending: NURSE PRACTITIONER | Admitting: NURSE PRACTITIONER

## 2023-07-30 ENCOUNTER — Ambulatory Visit (INDEPENDENT_AMBULATORY_CARE_PROVIDER_SITE_OTHER): Payer: Self-pay | Admitting: NURSE PRACTITIONER

## 2023-07-30 ENCOUNTER — Ambulatory Visit
Admission: RE | Admit: 2023-07-30 | Discharge: 2023-07-30 | Disposition: A | Discharge status: Home / Self Care | Source: Ambulatory Visit | Attending: HEMATOLOGY-ONCOLOGY | Admitting: HEMATOLOGY-ONCOLOGY

## 2023-07-30 ENCOUNTER — Encounter (HOSPITAL_COMMUNITY): Payer: Self-pay

## 2023-07-30 VITALS — BP 135/80 | HR 64 | Temp 98.2°F | Ht 69.0 in | Wt 230.0 lb

## 2023-07-30 VITALS — BP 131/82 | HR 66 | Temp 98.1°F | Resp 18

## 2023-07-30 DIAGNOSIS — C642 Malignant neoplasm of left kidney, except renal pelvis: Secondary | ICD-10-CM | POA: Insufficient documentation

## 2023-07-30 DIAGNOSIS — C799 Secondary malignant neoplasm of unspecified site: Secondary | ICD-10-CM | POA: Insufficient documentation

## 2023-07-30 DIAGNOSIS — Z5112 Encounter for antineoplastic immunotherapy: Secondary | ICD-10-CM | POA: Insufficient documentation

## 2023-07-30 DIAGNOSIS — R52 Pain, unspecified: Secondary | ICD-10-CM | POA: Insufficient documentation

## 2023-07-30 DIAGNOSIS — Z79620 Long term (current) use of immunosuppressive biologic: Secondary | ICD-10-CM | POA: Insufficient documentation

## 2023-07-30 DIAGNOSIS — Z2989 Encounter for other specified prophylactic measures: Secondary | ICD-10-CM

## 2023-07-30 DIAGNOSIS — R0602 Shortness of breath: Secondary | ICD-10-CM | POA: Insufficient documentation

## 2023-07-30 DIAGNOSIS — R0600 Dyspnea, unspecified: Secondary | ICD-10-CM

## 2023-07-30 DIAGNOSIS — Z7901 Long term (current) use of anticoagulants: Secondary | ICD-10-CM

## 2023-07-30 DIAGNOSIS — I823 Embolism and thrombosis of renal vein: Secondary | ICD-10-CM

## 2023-07-30 LAB — COMPREHENSIVE METABOLIC PANEL, NON-FASTING
ALBUMIN/GLOBULIN RATIO: 1.5 — ABNORMAL HIGH (ref 0.8–1.4)
ALBUMIN: 4.5 g/dL (ref 3.5–5.7)
ALKALINE PHOSPHATASE: 70 U/L (ref 34–104)
ALT (SGPT): 28 U/L (ref 7–52)
ANION GAP: 8 mmol/L (ref 4–13)
AST (SGOT): 29 U/L (ref 13–39)
BILIRUBIN TOTAL: 0.7 mg/dL (ref 0.3–1.0)
BUN/CREA RATIO: 10 (ref 6–22)
BUN: 12 mg/dL (ref 7–25)
CALCIUM, CORRECTED: 9.4 mg/dL (ref 8.9–10.8)
CALCIUM: 9.8 mg/dL (ref 8.6–10.3)
CHLORIDE: 108 mmol/L — ABNORMAL HIGH (ref 98–107)
CO2 TOTAL: 25 mmol/L (ref 21–31)
CREATININE: 1.26 mg/dL (ref 0.60–1.30)
ESTIMATED GFR: 51 mL/min/{1.73_m2} — ABNORMAL LOW (ref >59)
GLOBULIN: 3 (ref 2.0–3.5)
GLUCOSE: 122 mg/dL — ABNORMAL HIGH (ref 74–109)
OSMOLALITY, CALCULATED: 282 mosm/kg (ref 270–290)
POTASSIUM: 4 mmol/L (ref 3.5–5.1)
PROTEIN TOTAL: 7.5 g/dL (ref 6.4–8.9)
SODIUM: 141 mmol/L (ref 136–145)

## 2023-07-30 LAB — CBC WITH DIFF
BASOPHIL #: 0 10*3/uL (ref 0.00–0.10)
BASOPHIL %: 1 % (ref 0–1)
EOSINOPHIL #: 0.1 10*3/uL (ref 0.00–0.50)
EOSINOPHIL %: 4 % (ref 1–7)
HCT: 41.8 % (ref 31.2–41.9)
HGB: 13.9 g/dL (ref 10.9–14.3)
LYMPHOCYTE #: 1 10*3/uL — ABNORMAL LOW (ref 1.10–3.10)
LYMPHOCYTE %: 28 % (ref 16–46)
MCH: 29.5 pg (ref 24.7–32.8)
MCHC: 33.3 g/dL (ref 32.3–35.6)
MCV: 88.6 fL (ref 75.5–95.3)
MONOCYTE #: 0.2 10*3/uL (ref 0.20–0.90)
MONOCYTE %: 6 % (ref 4–11)
MPV: 10.3 fL (ref 7.9–10.8)
NEUTROPHIL #: 2.3 10*3/uL (ref 1.90–8.20)
NEUTROPHIL %: 62 % (ref 43–77)
PLATELETS: 135 10*3/uL — ABNORMAL LOW (ref 140–440)
RBC: 4.72 10*6/uL (ref 3.63–4.92)
RDW: 13.9 % (ref 12.3–17.7)
WBC: 3.7 10*3/uL — ABNORMAL LOW (ref 3.8–11.8)

## 2023-07-30 LAB — THYROXINE, FREE (FREE T4): THYROXINE (T4), FREE: 0.86 ng/dL (ref 0.61–1.12)

## 2023-07-30 LAB — MAGNESIUM: MAGNESIUM: 1.9 mg/dL (ref 1.9–2.7)

## 2023-07-30 LAB — THYROID STIMULATING HORMONE WITH FREE T4 REFLEX: TSH: 6.006 u[IU]/mL — ABNORMAL HIGH (ref 0.450–5.330)

## 2023-07-30 MED ORDER — EPINEPHRINE 1 MG/ML (1 ML) INJECTION SOLUTION
0.3000 mg | Freq: Once | INTRAMUSCULAR | Status: DC | PRN
Start: 2023-07-30 — End: 2023-07-31

## 2023-07-30 MED ORDER — ONDANSETRON HCL (PF) 4 MG/2 ML INJECTION SOLUTION
8.0000 mg | Freq: Once | INTRAMUSCULAR | Status: DC | PRN
Start: 2023-07-30 — End: 2023-07-31

## 2023-07-30 MED ORDER — ALBUTEROL SULFATE HFA 90 MCG/ACTUATION AEROSOL INHALER - RN
2.0000 | Freq: Once | RESPIRATORY_TRACT | Status: DC | PRN
Start: 2023-07-30 — End: 2023-07-31

## 2023-07-30 MED ORDER — DIPHENHYDRAMINE 50 MG/ML INJECTION SOLUTION
25.0000 mg | Freq: Once | INTRAMUSCULAR | Status: DC | PRN
Start: 2023-07-30 — End: 2023-07-31

## 2023-07-30 MED ORDER — DIPHENHYDRAMINE 50 MG/ML INJECTION SOLUTION
50.0000 mg | Freq: Once | INTRAMUSCULAR | Status: DC | PRN
Start: 2023-07-30 — End: 2023-07-31

## 2023-07-30 MED ORDER — ALBUTEROL SULFATE 2.5 MG/3 ML (0.083 %) SOLUTION FOR NEBULIZATION
2.5000 mg | INHALATION_SOLUTION | Freq: Once | RESPIRATORY_TRACT | Status: DC | PRN
Start: 2023-07-30 — End: 2023-07-31

## 2023-07-30 MED ORDER — DEXTROSE 5% IN WATER (D5W) FLUSH BAG - 250 ML
INTRAVENOUS | Status: DC | PRN
Start: 2023-07-30 — End: 2023-07-31

## 2023-07-30 MED ORDER — SODIUM CHLORIDE 0.9% FLUSH BAG - 250 ML
INTRAVENOUS | Status: DC | PRN
Start: 2023-07-30 — End: 2023-07-31

## 2023-07-30 MED ORDER — HYDROCORTISONE SOD SUCCINATE 100 MG/2 ML VIAL WRAPPER
100.0000 mg | Freq: Once | INTRAMUSCULAR | Status: DC | PRN
Start: 2023-07-30 — End: 2023-07-31

## 2023-07-30 MED ORDER — SODIUM CHLORIDE 0.9 % INTRAVENOUS SOLUTION
200.0000 mg | Freq: Once | INTRAVENOUS | Status: AC
Start: 2023-07-30 — End: 2023-07-30
  Administered 2023-07-30: 0 mg via INTRAVENOUS
  Administered 2023-07-30: 200 mg via INTRAVENOUS
  Filled 2023-07-30: qty 8

## 2023-07-30 MED ORDER — ONDANSETRON HCL 8 MG TABLET
8.0000 mg | ORAL_TABLET | Freq: Once | ORAL | Status: DC | PRN
Start: 2023-07-30 — End: 2023-07-31

## 2023-07-30 MED ORDER — FAMOTIDINE (PF) 20 MG/2 ML INTRAVENOUS SOLUTION
20.0000 mg | Freq: Once | INTRAVENOUS | Status: DC | PRN
Start: 2023-07-30 — End: 2023-07-31

## 2023-07-30 MED ORDER — MEPERIDINE (PF) 25 MG/ML INJECTION SOLUTION
12.5000 mg | Freq: Once | INTRAMUSCULAR | Status: DC | PRN
Start: 2023-07-30 — End: 2023-07-31

## 2023-07-30 NOTE — Cancer Center Note (Signed)
 Department of Hematology/Oncology  Return Patient Visit    Name: Taylor Smith  ZOX:[id number]  Date of Birth: [birth year]  Encounter Date: 07/30/2023    REFERRING PROVIDER:  Macario Golds, FNP-C  [address]  [place],  [place] [address]      REASON FOR OFFICE VISIT:  management of stage IV clear cell renal carcinoma    HISTORY OF PRESENT ILLNESS:A  Taylor Smith is a 53 y.o. 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       04/16/2023: The patient is here for follow up of metastatic renal cell cancer.  She is doing well and denies any new problems at this time other than some discomfort in her right hand because she states that she has always receiving her infusions in that same location.      05/06/22:  Patient is here for follow up of metastatic renal cell cancer.  She is doing well but has noticed increase in shortness of breath.  She states that she has had blood drawn off before and that helped.   She denies any increased cough.  She had recent CT scan at [facility] and no changes or issues with lungs were noted.     07/30/23: Patient is here for follow up metastatic renal cell cancer.   She has been taking care of her husband that had brain surgery.  She has noticed pain in her hand joints and her knees.  She had an injection in her left knee and was told that she had spurs in the knee.        Immune Related Symptom Assessment  Patient is receiving:: PEMBROLIZUMAB (07/30/23 0900)  Since last visit have you:  Experienced any extreme fatigue?: No (07/30/23 0900)  Experienced any constant or unusual headaches?: No (07/30/23 0900)  Experienced any dizziness or fainting?: No (07/30/23 0900)  Been unusually cold?: No (07/30/23 0900)  Experienced any heart palpitations?: No (07/30/23 0900)  Experienced any chest pain?: No (07/30/23 0900)  Experienced any unintentional weight loss?: No (07/30/23 0900)  Experienced any constant muscle or joint pain?: Yes (joint pain) (07/30/23 0900)  Experienced  any severe muscle weakness?: No (07/30/23 0900)  Experienced any rash?: Yes (had noticed something on hand but put lotion on and it went away) (07/30/23 0900)  Experienced any eyesight changes?: No (07/30/23 0900)  Experienced any new or worsening cough?: No (07/30/23 0900)  Experienced any increased shortness of breath?: No (07/30/23 0900)  Experienced any loose, watery or foul-smelling stool?: Yes (loose) (07/30/23 0900)  Experienced any pain or tenderness around abdomen?: No (07/30/23 0900)      Oncology History   Kidney cancer, primary, with metastasis from kidney to other site, left (CMS [facility])   01/06/2022 Biopsy/Results    Vaginal polyp, biopsy: Clear cell carcinoma, present at biopsy margin     01/13/2022 Initial Diagnosis    Kidney cancer, primary, with metastasis from kidney to other site, left (CMS HCC)     01/17/2022 - 04/11/2022 Chemotherapy Regimen    Plan: IPILIMUMAB (1 MG/KG) + NIVOLUMAB (3 MG/KG) EVERY 3 WEEK Start Date: 01/17/2022         10/09/2022 Biopsy/Results         11/19/2022 -  Chemotherapy Regimen    Plan: PEMBROLIZUMAB EVERY 3 WEEKS Start Date: 11/19/2022         03/09/2023 Imaging    CT CAP  NO NEW PULMONARY OR PLEURAL FINDINGS. SIGNIFICANT SUCCESSFUL RESPONSE OF THE TREATMENT  FOR PULMONARY NODULES     NO NEW FINDING AT THE LEFT RENAL FOSSA AFTER NEPHRECTOMY     SMALLER LEFT INGUINAL NODES COMPARED TO THE OLD EXAM FROM DECEMBER 2023 WITH NO DEFINITE NEW ADENOPATHY     INDETERMINATE APPEARANCE OF THE GASTRIC FUNDUS PROBABLY DUE TO INCOMPLETE DISTENTION. CORRELATE WITH ENDOSCOPY IF NOT RECENTLY DONE     INCIDENTAL FINDINGS AS DETAILED ABOVE            ROS:   Review of Systems   Constitutional:  Negative for appetite change, chills and fatigue.   HENT:   Negative for sore throat and trouble swallowing.    Eyes:  Negative for eye problems.   Respiratory:  Negative for cough and shortness of breath.    Cardiovascular:  Negative for chest pain and leg swelling.   Gastrointestinal:  Negative for  abdominal pain.   Genitourinary:  Negative for dysuria and hematuria.    Musculoskeletal:  Negative for arthralgias and gait problem.   Skin:  Negative for rash.   Neurological:  Negative for gait problem.   Hematological:  Negative for adenopathy.   Psychiatric/Behavioral:  Negative for depression.         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)     Kidney cancer, primary, with metastasis from kidney to other site, left (CMS [facility])          Past Surgical History:   Procedure Laterality Date    HX CESAREAN SECTION      HX CHOLECYSTECTOMY      HX CYST REMOVAL Right     HX HYSTERECTOMY      NEPHROURETERECTOMY Left     TOTAL THYROIDECTOMY           Social History     Socioeconomic History    Marital status: Married     Spouse name: Not on file    Number of children: Not on file    Years of education: Not on file    Highest education level: Not on file   Occupational History    Not on file   Tobacco Use    Smoking status: Never    Smokeless tobacco: Never   Vaping Use    Vaping status: Never Used   Substance and Sexual Activity    Alcohol use: Never    Drug use: Never    Sexual activity: Yes     Partners: Male   Other Topics Concern    Not on file   Social History Narrative    Not on file     Social Determinants of Health     Financial Resource Strain: Not on file   Transportation Needs: No Transportation Needs (10/10/2022)    Received [facility], [facility]    PRAPARE - Transportation     In the past 12 months, has lack of transportation kept you from medical appointments or from getting medications?: No     Lack of Transportation (Non-Medical): No   Social Connections: Not on file   Intimate Partner Violence: High Risk (01/13/2022)    Intimate Partner Violence     SDOH Domestic Violence: No   Housing Stability:  Not on file     Family Medical History:       Problem Relation (Age  of Onset)    Cancer Maternal Aunt, Maternal Grandmother    Colon Cancer Maternal Uncle    Lung Cancer Mother    Melanoma Maternal Aunt            Current Outpatient Medications   Medication Sig    ergocalciferol, vitamin D2, (DRISDOL) 1,250 mcg (50,000 unit) Oral Capsule TAKE 1 CAPSULE BY MOUTH ONE TIME PER WEEK    gabapentin (NEURONTIN) 300 mg Oral Capsule Take 1 Capsule (300 mg total) by mouth Once a day    Ibuprofen (MOTRIN) 600 mg Oral Tablet Take 1 Tablet (600 mg total) by mouth Every 6 hours as needed    levothyroxine (SYNTHROID) 150 mcg Oral Tablet TAKE 1 TABLET BY MOUTH EVERY MORNING.    omeprazole (PRILOSEC) 40 mg Oral Capsule, Delayed Release(E.C.) Take 1 Capsule (40 mg total) by mouth Once a day    pembrolizumab (KEYTRUDA) 25 mg/mL Intravenous Solution Infuse 8 mL (200 mg total) into a venous catheter Every 21 days    valsartan (DIOVAN) 40 mg Oral Tablet TAKE 2 TABLETS (80 MG TOTAL) BY MOUTH TWICE DAILY     Allergies   Allergen Reactions    Aldactone [Spironolactone] Itching       PHYSICAL EXAM:  BP 135/80 (Site: Right Arm, Patient Position: Sitting, Cuff Size: Adult)   Pulse 64   Temp 36.8 C (98.2 F) (Temporal)   Ht 1.753 m (5\' 9" )   Wt 104 kg (230 lb)   SpO2 98%   BMI 33.97 kg/m        ECOG Status: (0) Fully active, able to carry on all predisease performance without restriction   Physical Exam  Constitutional:       General: She is not in acute distress.     Appearance: Normal appearance.   Eyes:      Extraocular Movements: Extraocular movements intact.   Cardiovascular:      Rate and Rhythm: Normal rate and regular rhythm.   Pulmonary:      Effort: Pulmonary effort is normal.   Abdominal:      General: Abdomen is flat.      Palpations: Abdomen is soft.   Musculoskeletal:         General: Normal range of motion.      Cervical back: Normal range of motion.   Skin:     General: Skin is warm and dry.   Neurological:      General:  No focal deficit present.      Mental Status: She is alert.   Psychiatric:         Mood and Affect: Mood normal.         DIAGNOSTIC DATA:  CT RCC protocol incl chest w MIPS and dual abd pel w wo  Order: [id number]  Narrative    Procedure: CT Chest with IV Contrast  Procedure: CT Abdomen without and with IV Contrast  Procedure: CT Pelvis with IV Contrast    Comparison:  12/02/2022    Indication:  Kidney cancer, staging, C78.00 Secondary malignant neoplasm of  unspecified lung (CMS/HHS-HCC), C64.9 Malignant neoplasm of unspecified  kidney, except renal pelvis (CMS/HHS-HCC).    Technique:  CT imaging of the chest, abdomen, and pelvis was performed  following the administration of IV contrast. Imaging was performed through  the kidneys before the administration of contrast, followed by arterial  phase imaging through the liver, and portal venous phase imaging of the  chest, abdomen, and pelvis.   Iodinated contrast was used due  to the  indications for the examination, to improve disease detection and to  further define anatomy.   Coronal and sagittal reformatted images of the  abdomen and pelvis were generated and reviewed. 3-D maximum intensity  projection (MIP) reconstructions of the chest were performed to potentially  increase study sensitivity.    Findings:  Chest:    - Chest wall and Thoracic Inlet: No masses or lymphadenopathy.  Thyroidectomy.    - Mediastinum and Hila: No masses or lymphadenopathy.    - Thoracic Vessels: Normal caliber of the thoracic aorta and main pulmonary  artery.    - Heart and Pericardium: Normal heart size.  No pericardial effusion.    - Lungs and Airways: A 0.5 cm right lower lobe pulmonary nodule is  unchanged (series 9, image 59). Other tiny pulmonary nodules are also  unchanged.    - Pleura: No pleural effusions.      Abdomen and pelvis:    - Liver: Normal in morphology and enhancement.  No suspicious hepatic  masses are identified.  The portal and hepatic veins are patent.    - Biliary  and Gallbladder: Cholecystectomy. Mild central intrahepatic  biliary ductal dilatation, similar to prior.    - Spleen: Normal in appearance.      - Pancreas: Normal in appearance.    - Adrenal Glands: Normal right adrenal gland.    - Kidneys: Left nephrectomy. No suspicious renal lesions. No  hydronephrosis.    - Abdominal and Pelvic Vasculature: No abdominal aortic aneurysm.    - Gastrointestinal Tract: No abnormal dilation or wall thickening.    - Peritoneum/Mesentery/Retroperitoneum: No free fluid.  No free  intraperitoneal air.    - Lymph Nodes: No retroperitoneal or mesenteric lymphadenopathy.      - Bladder: Normal in appearance.    - Pelvic Organs: Unremarkable.    - Body Wall: Unremarkable.    - Musculoskeletal:  No aggressive appearing osseous lesions. Previously  seen osseous lesions in T1 and T3 are unchanged.      Impression:  No new or enlarging metastatic disease within the chest, abdomen, or  pelvis.  Tiny pulmonary nodules, unchanged.        LABS:   CBC  Diff   Lab Results   Component Value Date/Time    WBC 3.7 (L) 07/30/2023 08:47 AM    HGB 13.9 07/30/2023 08:47 AM    HCT 41.8 07/30/2023 08:47 AM    PLTCNT 135 (L) 07/30/2023 08:47 AM    RBC 4.72 07/30/2023 08:47 AM    MCV 88.6 07/30/2023 08:47 AM    MCHC 33.3 07/30/2023 08:47 AM    MCH 29.5 07/30/2023 08:47 AM    RDW 13.9 07/30/2023 08:47 AM    MPV 10.3 07/30/2023 08:47 AM    Lab Results   Component Value Date/Time    PMNS 62 07/30/2023 08:47 AM    LYMPHOCYTES 28 07/30/2023 08:47 AM    EOSINOPHIL 4 07/30/2023 08:47 AM    MONOCYTES 6 07/30/2023 08:47 AM    BASOPHILS 1 07/30/2023 08:47 AM    BASOPHILS 0.00 07/30/2023 08:47 AM    PMNABS 2.30 07/30/2023 08:47 AM    LYMPHSABS 1.00 (L) 07/30/2023 08:47 AM    EOSABS 0.10 07/30/2023 08:47 AM    MONOSABS 0.20 07/30/2023 08:47 AM            Comprehensive Metabolic Profile    Lab Results   Component Value Date    SODIUM 140 07/09/2023    POTASSIUM 4.2 07/09/2023  CHLORIDE 107 07/09/2023    CO2 26 07/09/2023     ANIONGAP 7 07/09/2023    BUN 16 07/09/2023    CREATININE 1.21 07/09/2023    ALBUMIN 4.6 07/09/2023    CALCIUM 9.7 07/09/2023    GLUCOSENF 86 07/09/2023    ALKPHOS 74 07/09/2023    ALT 26 07/09/2023    AST 28 07/09/2023    TOTBILIRUBIN 0.6 07/09/2023    TOTALPROTEIN 7.7 07/09/2023         ESTIMATED GFR   Date Value Ref Range Status   07/09/2023 53 (L) >59 mL/min/1.48m^2 Final             ASSESSMENT:    ICD-10-CM    1. Kidney cancer, primary, with metastasis from kidney to other site, left (CMS HCC)  C64.2       2. Encounter for immunotherapy  Z29.89                  PLAN:   1. All relevant medical records were reviewed including available pertinent provider notes, procedure notes, imaging, laboratory, and pathology.   2. All pertinent labs and/or imaging were reviewed with the patient.   3. Stage IV renal carcinoma:  She has had a great response to treatment, but has known pulmonary metastases.  She is tolerating treatment well and her scans have looked excellent.  We will continue with treatment unchanged.  We discussed that two years of therapy is considered reasonable in the setting of metastatic disease.  She will have treatment today.   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  5. Pain:  Improved.    6.Shortness of breath:  Phlebotomy was ordered but not completed.      Taylor Smith was given the chance to ask questions, and these were answered to their satisfaction. The patient is welcome to call with any questions or concerns in the meantime.     On the day of the encounter, a total of 25 minutes was spent on this patient encounter including review of historical information, examination, documentation and post-visit activities.   Return in about 6 weeks (around 09/10/2023).     Marvene Staff APRN, FNP-BC, AOCNP, 07/30/2023 , 09:42   You can see your note(s) in [facility]. It is common for you to encounter certain medical terminology which may be unfamiliar to you. You might see results before your  provider does so please give at least 2 business days for review. Please have this understanding, that NOT all abnormal results are significant. Our office will contact you for any urgent or emergent action if necessary. If you have any questions or concerns, feel free to send a MyChart message or call the office. Please call with any new or concerning symptoms.     The patient's insurance company bears full legal and financial responsibility resulting from any deviations they cause to my recommended treatment plan.  CC:  Macario Golds, FNP-C  [address]  [place] [address]    Macario Golds, FNP-C  [address]  [place],  [place] [address]    This note was partially generated using MModal Fluency Direct system, and there may be some incorrect words, spellings, and punctuation that were not noted in checking the note before saving.

## 2023-07-30 NOTE — Nurses Notes (Signed)
 8469: Patient arrived ambulatory. VSS. Assessments WNL. No complaints voiced.Has already seen provider and had labs done. Evans Lance, RN  1010: IV access established in [facility].Evans Lance, RN  [phone]: 200 mg Keytruda infused over 30 minutes per pharmacy.Evans Lance, RN  [phone]: Infusion and flush complete. VSS. IV removed with catheter intact. Evans Lance, RN  [phone]: Patient left unit ambulatory in no new reported distress. Evans Lance, RN

## 2023-08-01 ENCOUNTER — Encounter (INDEPENDENT_AMBULATORY_CARE_PROVIDER_SITE_OTHER): Payer: Self-pay | Admitting: NURSE PRACTITIONER

## 2023-08-03 ENCOUNTER — Other Ambulatory Visit (INDEPENDENT_AMBULATORY_CARE_PROVIDER_SITE_OTHER): Payer: Self-pay | Admitting: NURSE PRACTITIONER

## 2023-08-03 MED ORDER — OMEPRAZOLE 40 MG CAPSULE,DELAYED RELEASE
40.0000 mg | DELAYED_RELEASE_CAPSULE | Freq: Every day | ORAL | 1 refills | Status: DC
Start: 2023-08-03 — End: 2024-02-09

## 2023-08-11 ENCOUNTER — Encounter (INDEPENDENT_AMBULATORY_CARE_PROVIDER_SITE_OTHER): Payer: Self-pay | Admitting: HEMATOLOGY-ONCOLOGY

## 2023-08-11 ENCOUNTER — Ambulatory Visit (INDEPENDENT_AMBULATORY_CARE_PROVIDER_SITE_OTHER): Payer: Self-pay | Admitting: NURSE PRACTITIONER

## 2023-08-18 ENCOUNTER — Encounter (INDEPENDENT_AMBULATORY_CARE_PROVIDER_SITE_OTHER): Payer: Self-pay | Admitting: NURSE PRACTITIONER

## 2023-08-18 ENCOUNTER — Ambulatory Visit: Payer: Self-pay | Attending: NURSE PRACTITIONER | Admitting: NURSE PRACTITIONER

## 2023-08-18 ENCOUNTER — Other Ambulatory Visit (INDEPENDENT_AMBULATORY_CARE_PROVIDER_SITE_OTHER): Payer: Self-pay | Admitting: HEMATOLOGY-ONCOLOGY

## 2023-08-18 ENCOUNTER — Other Ambulatory Visit: Payer: Self-pay

## 2023-08-18 VITALS — BP 143/91 | HR 65 | Resp 16 | Ht 69.02 in | Wt 230.0 lb

## 2023-08-18 DIAGNOSIS — Z0000 Encounter for general adult medical examination without abnormal findings: Secondary | ICD-10-CM | POA: Insufficient documentation

## 2023-08-18 DIAGNOSIS — I10 Essential (primary) hypertension: Secondary | ICD-10-CM | POA: Insufficient documentation

## 2023-08-18 DIAGNOSIS — Z6833 Body mass index (BMI) 33.0-33.9, adult: Secondary | ICD-10-CM | POA: Insufficient documentation

## 2023-08-18 DIAGNOSIS — Z90710 Acquired absence of both cervix and uterus: Secondary | ICD-10-CM | POA: Insufficient documentation

## 2023-08-18 DIAGNOSIS — E559 Vitamin D deficiency, unspecified: Secondary | ICD-10-CM | POA: Insufficient documentation

## 2023-08-18 DIAGNOSIS — Z1239 Encounter for other screening for malignant neoplasm of breast: Secondary | ICD-10-CM

## 2023-08-18 DIAGNOSIS — K219 Gastro-esophageal reflux disease without esophagitis: Secondary | ICD-10-CM | POA: Insufficient documentation

## 2023-08-18 DIAGNOSIS — E039 Hypothyroidism, unspecified: Secondary | ICD-10-CM | POA: Insufficient documentation

## 2023-08-18 DIAGNOSIS — E663 Overweight: Secondary | ICD-10-CM | POA: Insufficient documentation

## 2023-08-18 NOTE — Progress Notes (Signed)
 INTERNAL [facility]ICINE, [facility]  [address]  [place] [address]  Operated by [facility]     Name: Taylor Smith MRN:  [id number]   Date: 08/18/2023 Age: 53 y.o.       OUTPATIENT PROGRESS NOTE    Subjective:   Patient ID:  Taylor Smith is a pleasant 53 y.o. female.    Chief Complaint: Annual Wellness Exam      History of Present Illness:  Pt presents for a well adult exam.      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      Allergies:     Allergies   Allergen Reactions    Aldactone [Spironolactone] Itching         Medications:   ergocalciferol , vitamin D2, (DRISDOL ) 1,250 mcg (50,000 unit) Oral Capsule, TAKE 1 CAPSULE BY MOUTH ONE TIME PER WEEK  gabapentin  (NEURONTIN ) 300 mg Oral Capsule, Take 1 Capsule (300 mg total) by mouth Once a day (Patient not taking: Reported on 08/18/2023)  Ibuprofen  (MOTRIN ) 600 mg Oral Tablet, Take 1 Tablet (600 mg total) by mouth Every 6 hours as needed  levothyroxine  (SYNTHROID) 150 mcg Oral Tablet, TAKE 1 TABLET BY MOUTH EVERY MORNING.  omeprazole  (PRILOSEC) 40 mg Oral Capsule, Delayed Release(E.C.), Take 1 Capsule (40 mg total) by mouth Daily  pembrolizumab  (KEYTRUDA ) 25 mg/mL Intravenous Solution, Infuse 8 mL (200 mg total) into a venous catheter Every 21 days  valsartan  (DIOVAN ) 40 mg Oral Tablet, TAKE 2 TABLETS (80 MG TOTAL) BY MOUTH TWICE DAILY (Patient taking differently: Take 2 Tablets (80 mg total) by mouth Daily)    No  facility-administered medications prior to visit.        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)     Kidney cancer, primary, with metastasis from kidney to other site, left (CMS [facility])           Past Surgical History:     Past Surgical History:   Procedure Laterality Date    HX CESAREAN SECTION      HX CHOLECYSTECTOMY      HX CYST REMOVAL Right     HX HYSTERECTOMY      NEPHROURETERECTOMY Left     TOTAL THYROIDECTOMY            Family History:     Family Medical History:       Problem Relation (Age of  Onset)    Cancer Maternal Aunt, Maternal Grandmother    Colon Cancer Maternal Uncle    Lung Cancer Mother    Melanoma Maternal Aunt               Social History:     Social History     Socioeconomic History    Marital status: Married     Spouse name: Not on file    Number of children: Not on file    Years of education: Not on file    Highest education level: Not on file   Occupational History    Not on file   Tobacco Use    Smoking status: Never    Smokeless tobacco: Never   Vaping Use    Vaping status: Never Used   Substance and Sexual Activity    Alcohol  use: Never    Drug use: Never    Sexual activity: Yes     Partners: Male   Other Topics Concern    Not on file   Social History Narrative    Not on file     Social Determinants of Health     Financial Resource Strain: Not on file   Transportation Needs: No Transportation Needs (10/10/2022)    Received from [facility], [facility]    PRAPARE - Transportation     In the past 12 months, has lack of transportation kept  you from medical appointments or from getting medications?: No     Lack of Transportation (Non-Medical): No   Social Connections: Not on file   Intimate Partner Violence: High Risk (01/13/2022)    Intimate Partner Violence     SDOH Domestic Violence: No   Housing Stability: Not on file            Review of Systems:   Review of System as discussed in HPI   Objective:   Vitals:    Vitals:    08/18/23 0918 08/18/23 0920   BP: (!) 137/104 (!) 143/91   Pulse: 65    Resp: 16    SpO2: 98%    Weight: 104 kg (230 lb)    Height: 1.753 m (5' 9.02")    BMI: 33.95           Body mass index is 33.95 kg/m.  Physical Exam  Vitals and nursing note reviewed.   Constitutional:       General: She is not in acute distress.     Appearance: Normal appearance. She is well-developed, well-groomed and overweight. She is not ill-appearing or diaphoretic.   HENT:      Head: Normocephalic and atraumatic.   Neck:      Thyroid : No thyromegaly.      Vascular: No carotid bruit.   Cardiovascular:      Rate and Rhythm: Normal rate.      Pulses: Normal pulses.           Radial pulses are 2+ on the right side and 2+ on the left side.        Popliteal pulses are 2+ on the right side and 2+ on the left side.      Heart sounds: Normal heart sounds, S1 normal and S2 normal.   Pulmonary:      Effort: Pulmonary effort is normal.      Breath sounds: Normal breath sounds.   Abdominal:      General: Abdomen is flat. Bowel sounds are normal.  Palpations: Abdomen is soft.   Musculoskeletal:      Cervical back: Neck supple.      Right lower leg: No edema.      Left lower leg: No edema.   Skin:     General: Skin is warm and dry.      Capillary Refill: Capillary refill takes less than 2 seconds.   Neurological:      General: No focal deficit present.      Mental Status: She is alert and oriented to person, place, and time.   Psychiatric:         Behavior: Behavior is cooperative.          Labs:     Hospital Outpatient Visit on 07/30/2023   Component Date Value  Ref Range Status    SODIUM 07/30/2023 141  136 - 145 mmol/L Final    POTASSIUM 07/30/2023 4.0  3.5 - 5.1 mmol/L Final    CHLORIDE 07/30/2023 108 (H)  98 - 107 mmol/L Final    CO2 TOTAL 07/30/2023 25  21 - 31 mmol/L Final    ANION GAP 07/30/2023 8  4 - 13 mmol/L Final    BUN 07/30/2023 12  7 - 25 mg/dL Final    CREATININE 16/01/9603 1.26  0.60 - 1.30 mg/dL Final    BUN/CREA RATIO 07/30/2023 10  6 - 22 Final    ESTIMATED GFR 07/30/2023 51 (L)  >59 mL/min/1.59m^2 Final    ALBUMIN 07/30/2023 4.5  3.5 - 5.7 g/dL Final    CALCIUM 54/12/8117 9.8  8.6 - 10.3 mg/dL Final    GLUCOSE 14/78/2956 122 (H)  74 - 109 mg/dL Final    ALKALINE PHOSPHATASE 07/30/2023 70  34 - 104 U/L Final    ALT (SGPT) 07/30/2023 28  7 - 52 U/L Final    AST (SGOT) 07/30/2023 29  13 - 39 U/L Final    BILIRUBIN TOTAL 07/30/2023 0.7  0.3 - 1.0 mg/dL Final    PROTEIN TOTAL 07/30/2023 7.5  6.4 - 8.9 g/dL Final    ALBUMIN/GLOBULIN RATIO 07/30/2023 1.5 (H)  0.8 - 1.4 Final    OSMOLALITY, CALCULATED 07/30/2023 282  270 - 290 mOsm/kg Final    CALCIUM, CORRECTED 07/30/2023 9.4  8.9 - 10.8 mg/dL Final    GLOBULIN 21/30/8657 3.0  2.0 - 3.5 Final    MAGNESIUM 07/30/2023 1.9  1.9 - 2.7 mg/dL Final    TSH 84/69/6295 6.006 (H)  0.450 - 5.330 uIU/mL Final    WBC 07/30/2023 3.7 (L)  3.8 - 11.8 x10^3/uL Final    RBC 07/30/2023 4.72  3.63 - 4.92 x10^6/uL Final    HGB 07/30/2023 13.9  10.9 - 14.3 g/dL Final    HCT 28/41/3244 41.8  31.2 - 41.9 % Final    MCV 07/30/2023 88.6  75.5 - 95.3 fL Final    MCH 07/30/2023 29.5  24.7 - 32.8 pg Final    MCHC 07/30/2023 33.3  32.3 - 35.6 g/dL Final    RDW 04/23/7251 13.9  12.3 - 17.7 % Final    PLATELETS 07/30/2023 135 (L)  140 - 440 x10^3/uL Final    MPV 07/30/2023 10.3  7.9 - 10.8 fL Final    NEUTROPHIL % 07/30/2023 62  43 - 77 % Final    LYMPHOCYTE % 07/30/2023 28  16 - 46 % Final    MONOCYTE % 07/30/2023 6  4 - 11 % Final    EOSINOPHIL % 07/30/2023 4  1 - 7 % Final  BASOPHIL % 07/30/2023 1  0 - 1 % Final    NEUTROPHIL #  07/30/2023 2.30  1.90 - 8.20 x10^3/uL Final    LYMPHOCYTE # 07/30/2023 1.00 (L)  1.10 - 3.10 x10^3/uL Final    MONOCYTE # 07/30/2023 0.20  0.20 - 0.90 x10^3/uL Final    EOSINOPHIL # 07/30/2023 0.10  0.00 - 0.50 x10^3/uL Final    BASOPHIL # 07/30/2023 0.00  0.00 - 0.10 x10^3/uL Final    THYROXINE (T4), FREE 07/30/2023 0.86  0.61 - 1.12 ng/dL Final   Hospital Outpatient Visit on 07/09/2023   Component Date Value Ref Range Status    SODIUM 07/09/2023 140  136 - 145 mmol/L Final    POTASSIUM 07/09/2023 4.2  3.5 - 5.1 mmol/L Final    CHLORIDE 07/09/2023 107  98 - 107 mmol/L Final    CO2 TOTAL 07/09/2023 26  21 - 31 mmol/L Final    ANION GAP 07/09/2023 7  4 - 13 mmol/L Final    BUN 07/09/2023 16  7 - 25 mg/dL Final    CREATININE 78/46/9629 1.21  0.60 - 1.30 mg/dL Final    BUN/CREA RATIO 07/09/2023 13  6 - 22 Final    ESTIMATED GFR 07/09/2023 53 (L)  >59 mL/min/1.69m^2 Final    ALBUMIN 07/09/2023 4.6  3.5 - 5.7 g/dL Final    CALCIUM 52/84/1324 9.7  8.6 - 10.3 mg/dL Final    GLUCOSE 40/01/2724 86  74 - 109 mg/dL Final    ALKALINE PHOSPHATASE 07/09/2023 74  34 - 104 U/L Final    ALT (SGPT) 07/09/2023 26  7 - 52 U/L Final    AST (SGOT) 07/09/2023 28  13 - 39 U/L Final    BILIRUBIN TOTAL 07/09/2023 0.6  0.3 - 1.0 mg/dL Final    PROTEIN TOTAL 07/09/2023 7.7  6.4 - 8.9 g/dL Final    ALBUMIN/GLOBULIN RATIO 07/09/2023 1.5 (H)  0.8 - 1.4 Final    OSMOLALITY, CALCULATED 07/09/2023 280  270 - 290 mOsm/kg Final    CALCIUM, CORRECTED 07/09/2023 9.2  8.9 - 10.8 mg/dL Final    GLOBULIN 36/64/4034 3.1  2.0 - 3.5 Final    MAGNESIUM 07/09/2023 2.0  1.9 - 2.7 mg/dL Final    TSH 74/25/9563 0.852  0.450 - 5.330 uIU/mL Final    WBC 07/09/2023 4.1  3.8 - 11.8 x10^3/uL Final    RBC 07/09/2023 4.70  3.63 - 4.92 x10^6/uL Final    HGB 07/09/2023 14.3  10.9 - 14.3 g/dL Final    HCT 87/56/4332 41.5  31.2 - 41.9 % Final    MCV 07/09/2023 88.2  75.5 - 95.3 fL Final    MCH 07/09/2023 30.3  24.7 - 32.8 pg Final    MCHC 07/09/2023 34.4  32.3 - 35.6 g/dL  Final    RDW 95/18/8416 13.8  12.3 - 17.7 % Final    PLATELETS 07/09/2023 138 (L)  140 - 440 x10^3/uL Final    MPV 07/09/2023 10.0  7.9 - 10.8 fL Final    NEUTROPHIL % 07/09/2023 56  43 - 77 % Final    LYMPHOCYTE % 07/09/2023 31  16 - 46 % Final    MONOCYTE % 07/09/2023 9  4 - 11 % Final    EOSINOPHIL % 07/09/2023 3  1 - 7 % Final    BASOPHIL % 07/09/2023 1  0 - 1 % Final    NEUTROPHIL # 07/09/2023 2.30  1.90 - 8.20 x10^3/uL Final    LYMPHOCYTE # 07/09/2023 1.30  1.10 -  3.10 x10^3/uL Final    MONOCYTE # 07/09/2023 0.30  0.20 - 0.90 x10^3/uL Final    EOSINOPHIL # 07/09/2023 0.10  0.00 - 0.50 x10^3/uL Final    BASOPHIL # 07/09/2023 0.00  0.00 - 0.10 x10^3/uL Final   Hospital Outpatient Visit on 06/18/2023   Component Date Value Ref Range Status    SODIUM 06/18/2023 139  136 - 145 mmol/L Final    POTASSIUM 06/18/2023 3.8  3.5 - 5.1 mmol/L Final    CHLORIDE 06/18/2023 104  98 - 107 mmol/L Final    CO2 TOTAL 06/18/2023 26  21 - 31 mmol/L Final    ANION GAP 06/18/2023 9  4 - 13 mmol/L Final    BUN 06/18/2023 14  7 - 25 mg/dL Final    CREATININE 54/12/8117 1.21  0.60 - 1.30 mg/dL Final    BUN/CREA RATIO 06/18/2023 12  6 - 22 Final    ESTIMATED GFR 06/18/2023 53 (L)  >59 mL/min/1.52m^2 Final    ALBUMIN 06/18/2023 4.3  3.5 - 5.7 g/dL Final    CALCIUM 14/78/2956 9.4  8.6 - 10.3 mg/dL Final    GLUCOSE 21/30/8657 110 (H)  74 - 109 mg/dL Final    ALKALINE PHOSPHATASE 06/18/2023 79  34 - 104 U/L Final    ALT (SGPT) 06/18/2023 22  7 - 52 U/L Final    AST (SGOT) 06/18/2023 25  13 - 39 U/L Final    BILIRUBIN TOTAL 06/18/2023 0.7  0.3 - 1.0 mg/dL Final    PROTEIN TOTAL 06/18/2023 7.6  6.4 - 8.9 g/dL Final    ALBUMIN/GLOBULIN RATIO 06/18/2023 1.3  0.8 - 1.4 Final    OSMOLALITY, CALCULATED 06/18/2023 279  270 - 290 mOsm/kg Final    CALCIUM, CORRECTED 06/18/2023 9.2  8.9 - 10.8 mg/dL Final    GLOBULIN 84/69/6295 3.3  2.0 - 3.5 Final    MAGNESIUM 06/18/2023 1.9  1.9 - 2.7 mg/dL Final    TSH 28/41/3244 3.339  0.450 - 5.330 uIU/mL Final     WBC 06/18/2023 4.3  3.8 - 11.8 x10^3/uL Final    RBC 06/18/2023 4.91  3.63 - 4.92 x10^6/uL Final    HGB 06/18/2023 14.7 (H)  10.9 - 14.3 g/dL Final    HCT 04/23/7251 43.2 (H)  31.2 - 41.9 % Final    MCV 06/18/2023 88.0  75.5 - 95.3 fL Final    MCH 06/18/2023 29.9  24.7 - 32.8 pg Final    MCHC 06/18/2023 33.9  32.3 - 35.6 g/dL Final    RDW 66/44/0347 13.3  12.3 - 17.7 % Final    PLATELETS 06/18/2023 149  140 - 440 x10^3/uL Final    MPV 06/18/2023 10.1  7.9 - 10.8 fL Final    NEUTROPHIL % 06/18/2023 60  43 - 77 % Final    LYMPHOCYTE % 06/18/2023 27  16 - 46 % Final    MONOCYTE % 06/18/2023 8  4 - 11 % Final    EOSINOPHIL % 06/18/2023 4  1 - 7 % Final    BASOPHIL % 06/18/2023 1  0 - 1 % Final    NEUTROPHIL # 06/18/2023 2.60  1.90 - 8.20 x10^3/uL Final    LYMPHOCYTE # 06/18/2023 1.20  1.10 - 3.10 x10^3/uL Final    MONOCYTE # 06/18/2023 0.30  0.20 - 0.90 x10^3/uL Final    EOSINOPHIL # 06/18/2023 0.20  0.00 - 0.50 x10^3/uL Final    BASOPHIL # 06/18/2023 0.00  0.00 - 0.10 x10^3/uL Final    SIGNATERA TEST  RESULT 06/18/2023 NEGATIVE   Final    SIGNATERA MTM READOUT 06/18/2023 0  MTM/ml Final    Comment: Limitations  Signatera is a personalized, tumor-informed test for the longitudinal detection of circulating tumor DNA (ctDNA). Interval testing is recommended for all patients. Studies have demonstrated that when ctDNA is detected (Signatera Positive) following surgery or definitive treatment, the risk for disease relapse is high without further treatment. Conversely, when ctDNA is not detected, the patient may be considered at lower risk for relapse. For those with multiple timepoints, upward trending ctDNA levels are suggestive of increasing tumor burden (1,2). For a single time point in isolation, the absolute MTM/mL value has no known clinical significance and should not be compared across patients. Test results should be interpreted in context of other clinicopathological features. ctDNA detection sensitivity may be  limited due to blood collection within two weeks of surgery and while the patient is on therapy. Signatera is a quantitative test and reports in units of mean tumor                            molecules per   ml (MTM/mL), which is comprised of three measured components (plasma volume, cell free DNA (cfDNA) concentration, and Variant Allele Frequency (VAF)). The MTM/mL number will be qualified if any measured component falls outside the analytical measurement range for that component. The analytical sensitivity is 95% at the limit of detection (0.3 MTM/mL). Results obtained are specific to the assessed time point. A negative test result does not definitively indicate the absence of cancer. This test is not designed to detect or report germline variation, nor does it infer hereditary cancer risk for the patient. Each Signatera assay is designed to a single tumor for a given patient. At this time, multiple personalized Signatera assays cannot be developed for the same patient. This test is designed to detect ctDNA from the assayed tumor only; new primary tumors will not be detected. There is a low risk that a new primary may share a variant that could interfere with the                            Signatera test.   Testing cannot be performed in patients who are pregnant, have a history of bone marrow transplant, or history of blood transfusion within three months. This test is expected to have limited sensitivity in cancer types such as GIST, renal cell carcinomas, primary brain tumors, and lymphoma due to limited ctDNA shed. 1 Bratman SV, Feliciana Horn, Iafolla MAJ, et al. Personalized circulating tumor DNA analysis as a predictive biomarker in solid tumor patients treated with pembrolizumab . Nature Cancer. 2020;1(9):873-881. 2 Henriksen TV, Tarazona N, et al., Circulating Tumor DNA in Stage III Colorectal Cancer, beyond Minimal Residual Disease Detection, toward Assessment of Adjuvant Therapy Efficacy and Clinical Behavior  of Recurrences. Clin Cancer Res. 2021; 28(3):507-517.  Methodology  FFPE samples are assessed by a pathologist to identify tumor margins and percent tumor content. Tumor DNA is extracted using Qiagen AllPrep. Whole genomic DNA is isolated from peripheral blood                            using QIAamp DNA Blood Mini Kit to provide a baseline DNA sequence. Circulating tumor DNA (ctDNA) is extracted from plasma derived from whole blood samples collected in cell-free DNA blood tubes (Streck) using  the QIAsymphony automated or manual extraction method (Qiagen). Using a proprietary algorithm, putative, clonal variants present in the tumor but absent in the germline DNA are identified to design the customized multiplex PCR assay. Whole-exome sequencing is performed on tumor and peripheral blood DNA using KAPA HyperPrep Warden/ranger (Roche) with a custom xGen exome capture (IDT). Using a proprietary algorithm, putative clonal variants present in the tumor but absent in the germline DNA are identified to design the customized multiplex PCR assay. Circulating tumor DNA is extracted from   plasma collected in Streck tubes using [employer] proprietary methods. The customized PCR assays are run to detect presence or absence of these variants within circulating plasma. A patient's                            plasma sample is considered ctDNA positive when at least two individual-specific tumor variants are detected. When fewer than two individual-specific tumor variants are observed, a negative result is issued. Tumor variation outside of the individual, tumor specific variants is not assessed. Pathology services and whole exome sequencing are performed at [facility] (CLIA ID# [id number]) [address], [place], [place] [address].  Disclaimer  The extraction, Designer, television/film set, and sequencing for this test were performed by [facility] [address], [place], [place] [address] (CLIA [place] [id number]). The data analysis and  reporting for this test were performed by [facility] [address], [place], [place] [address] (CLIA [place] [id number]). This test was developed and its performance characteristics determined by [facility]. The test has not been cleared or approved by the U.S. Food and Drug Administration (FDA). CAP accredited, ISO 13485 certified, and CLIA certified.                            Pathology services and whole exome sequencing for this test were performed by [facility], [address] [place] [address], CLIA # [id number].  2021 [employer] All Rights Reserved.   Hospital Outpatient Visit on 05/28/2023   Component Date Value Ref Range Status    SODIUM 05/28/2023 138  136 - 145 mmol/L Final    POTASSIUM 05/28/2023 3.8  3.5 - 5.1 mmol/L Final    CHLORIDE 05/28/2023 108 (H)  98 - 107 mmol/L Final    CO2 TOTAL 05/28/2023 21  21 - 31 mmol/L Final    ANION GAP 05/28/2023 9  4 - 13 mmol/L Final    BUN 05/28/2023 12  7 - 25 mg/dL Final    CREATININE 02/72/5366 1.10  0.60 - 1.30 mg/dL Final    BUN/CREA RATIO 05/28/2023 11  6 - 22 Final    ESTIMATED GFR 05/28/2023 60  >59 mL/min/1.57m^2 Final    ALBUMIN 05/28/2023 4.2  3.5 - 5.7 g/dL Final    CALCIUM 44/06/4740 9.6  8.6 - 10.3 mg/dL Final    GLUCOSE 59/56/3875 108  74 - 109 mg/dL Final    ALKALINE PHOSPHATASE 05/28/2023 71  34 - 104 U/L Final    ALT (SGPT) 05/28/2023 21  7 - 52 U/L Final    AST (SGOT) 05/28/2023 23  13 - 39 U/L Final    BILIRUBIN TOTAL 05/28/2023 0.6  0.3 - 1.0 mg/dL Final    PROTEIN TOTAL 05/28/2023 7.4  6.4 - 8.9 g/dL Final    ALBUMIN/GLOBULIN RATIO 05/28/2023 1.3  0.8 - 1.4 Final    OSMOLALITY, CALCULATED  05/28/2023 276  270 - 290 mOsm/kg Final    CALCIUM, CORRECTED 05/28/2023 9.4  8.9 - 10.8 mg/dL Final    GLOBULIN 16/01/9603 3.2  2.0 - 3.5 Final    MAGNESIUM 05/28/2023 1.8 (L)  1.9 - 2.7 mg/dL Final    TSH 54/12/8117 1.372  0.450 - 5.330 uIU/mL Final    WBC 05/28/2023 3.9  3.8 - 11.8 x10^3/uL Final    RBC 05/28/2023 4.80  3.63 - 4.92 x10^6/uL Final     HGB 05/28/2023 14.2  10.9 - 14.3 g/dL Final    HCT 14/78/2956 42.7 (H)  31.2 - 41.9 % Final    MCV 05/28/2023 89.0  75.5 - 95.3 fL Final    MCH 05/28/2023 29.6  24.7 - 32.8 pg Final    MCHC 05/28/2023 33.3  32.3 - 35.6 g/dL Final    RDW 21/30/8657 13.4  12.3 - 17.7 % Final    PLATELETS 05/28/2023 141  140 - 440 x10^3/uL Final    MPV 05/28/2023 9.7  7.9 - 10.8 fL Final    NEUTROPHIL % 05/28/2023 58  43 - 77 % Final    LYMPHOCYTE % 05/28/2023 30  16 - 46 % Final    MONOCYTE % 05/28/2023 8  4 - 11 % Final    EOSINOPHIL % 05/28/2023 4  1 - 7 % Final    BASOPHIL % 05/28/2023 1  0 - 1 % Final    NEUTROPHIL # 05/28/2023 2.30  1.90 - 8.20 x10^3/uL Final    LYMPHOCYTE # 05/28/2023 1.20  1.10 - 3.10 x10^3/uL Final    MONOCYTE # 05/28/2023 0.30  0.20 - 0.90 x10^3/uL Final    EOSINOPHIL # 05/28/2023 0.20  0.00 - 0.50 x10^3/uL Final    BASOPHIL # 05/28/2023 0.00  0.00 - 0.10 x10^3/uL Final   Hospital Outpatient Visit on 05/07/2023   Component Date Value Ref Range Status    SODIUM 05/07/2023 139  136 - 145 mmol/L Final    POTASSIUM 05/07/2023 3.9  3.5 - 5.1 mmol/L Final    CHLORIDE 05/07/2023 106  98 - 107 mmol/L Final    CO2 TOTAL 05/07/2023 23  21 - 31 mmol/L Final    ANION GAP 05/07/2023 10  4 - 13 mmol/L Final    BUN 05/07/2023 7  7 - 25 mg/dL Final    CREATININE 84/69/6295 1.13  0.60 - 1.30 mg/dL Final    BUN/CREA RATIO 05/07/2023 6  6 - 22 Final    ESTIMATED GFR 05/07/2023 58 (L)  >59 mL/min/1.54m^2 Final    ALBUMIN 05/07/2023 4.3  3.5 - 5.7 g/dL Final    CALCIUM 28/41/3244 9.5  8.6 - 10.3 mg/dL Final    GLUCOSE 04/23/7251 126 (H)  74 - 109 mg/dL Final    ALKALINE PHOSPHATASE 05/07/2023 73  34 - 104 U/L Final    ALT (SGPT) 05/07/2023 25  7 - 52 U/L Final    AST (SGOT) 05/07/2023 26  13 - 39 U/L Final    BILIRUBIN TOTAL 05/07/2023 0.7  0.3 - 1.0 mg/dL Final    PROTEIN TOTAL 05/07/2023 7.6  6.4 - 8.9 g/dL Final    ALBUMIN/GLOBULIN RATIO 05/07/2023 1.3  0.8 - 1.4 Final    OSMOLALITY, CALCULATED 05/07/2023 277  270 - 290  mOsm/kg Final    CALCIUM, CORRECTED 05/07/2023 9.3  8.9 - 10.8 mg/dL Final    GLOBULIN 66/44/0347 3.3  2.0 - 3.5 Final    MAGNESIUM 05/07/2023 1.8 (L)  1.9 - 2.7 mg/dL Final  TSH 05/07/2023 1.501  0.450 - 5.330 uIU/mL Final    WBC 05/07/2023 4.3  3.8 - 11.8 x10^3/uL Final    RBC 05/07/2023 4.83  3.63 - 4.92 x10^6/uL Final    HGB 05/07/2023 14.7 (H)  10.9 - 14.3 g/dL Final    HCT 04/23/7251 42.8 (H)  31.2 - 41.9 % Final    MCV 05/07/2023 88.6  75.5 - 95.3 fL Final    MCH 05/07/2023 30.4  24.7 - 32.8 pg Final    MCHC 05/07/2023 34.3  32.3 - 35.6 g/dL Final    RDW 66/44/0347 13.2  12.3 - 17.7 % Final    PLATELETS 05/07/2023 142  140 - 440 x10^3/uL Final    MPV 05/07/2023 10.0  7.9 - 10.8 fL Final    NEUTROPHIL % 05/07/2023 61  43 - 77 % Final    LYMPHOCYTE % 05/07/2023 27  16 - 44 % Final    MONOCYTE % 05/07/2023 7  5 - 13 % Final    EOSINOPHIL % 05/07/2023 4  1 - 7 % Final    BASOPHIL % 05/07/2023 1  0 - 1 % Final    NEUTROPHIL # 05/07/2023 2.60  1.85 - 7.80 x10^3/uL Final    LYMPHOCYTE # 05/07/2023 1.20  1.00 - 3.00 x10^3/uL Final    MONOCYTE # 05/07/2023 0.30  0.30 - 1.00 x10^3/uL Final    EOSINOPHIL # 05/07/2023 0.20  0.00 - 0.50 x10^3/uL Final    BASOPHIL # 05/07/2023 0.00  0.00 - 0.10 x10^3/uL Final   Admission on 05/06/2023, Discharged on 05/06/2023   Component Date Value Ref Range Status    Final Diagnosis 05/06/2023    Final                    Value:Stomach, antrum, biopsy:    Antral type gastric mucosa showing moderate chronic inactive gastritis.  No organisms morphologically compatible with Helicobacter species are seen on immunostain.  Controls stain appropriately.  No intestinal metaplasia, dysplasia or malignancy identified.      Clinical History 05/06/2023    Final                    Value:Procedure: EGD WITH BIOPSY   Surgeon: Eliot Guernsey, MD (Primary)   Pre-op Diagnosis: Dyspepsia   Post-op Diagnosis: mild gastritis  grade B esophagitis         Gross Description 05/06/2023    Final                     Value:A: Antrum  The specimen is received in formalin labeled with the patient's name and "gastric antrum biopsy" and consists of 1 soft tan piece of tissue measuring 5 mm, 1 cassette.       Hospital Outpatient Visit on 04/16/2023   Component Date Value Ref Range Status    SODIUM 04/16/2023 141  136 - 145 mmol/L Final    POTASSIUM 04/16/2023 4.1  3.5 - 5.1 mmol/L Final    CHLORIDE 04/16/2023 106  98 - 107 mmol/L Final    CO2 TOTAL 04/16/2023 29  21 - 31 mmol/L Final    ANION GAP 04/16/2023 6  4 - 13 mmol/L Final    BUN 04/16/2023 16  7 - 25 mg/dL Final    CREATININE 42/59/5638 1.13  0.60 - 1.30 mg/dL Final    BUN/CREA RATIO 04/16/2023 14  6 - 22 Final    ESTIMATED GFR 04/16/2023 58 (L)  >59 mL/min/1.62m^2 Final  ALBUMIN 04/16/2023 4.3  3.5 - 5.7 g/dL Final    CALCIUM 16/01/9603 9.6  8.6 - 10.3 mg/dL Final    GLUCOSE 54/12/8117 94  74 - 109 mg/dL Final    ALKALINE PHOSPHATASE 04/16/2023 71  34 - 104 U/L Final    ALT (SGPT) 04/16/2023 24  7 - 52 U/L Final    AST (SGOT) 04/16/2023 25  13 - 39 U/L Final    BILIRUBIN TOTAL 04/16/2023 0.6  0.3 - 1.0 mg/dL Final    PROTEIN TOTAL 04/16/2023 7.5  6.4 - 8.9 g/dL Final    ALBUMIN/GLOBULIN RATIO 04/16/2023 1.3  0.8 - 1.4 Final    OSMOLALITY, CALCULATED 04/16/2023 282  270 - 290 mOsm/kg Final    CALCIUM, CORRECTED 04/16/2023 9.4  8.9 - 10.8 mg/dL Final    GLOBULIN 14/78/2956 3.2  2.0 - 3.5 Final    MAGNESIUM 04/16/2023 1.8 (L)  1.9 - 2.7 mg/dL Final    TSH 21/30/8657 0.405 (L)  0.450 - 5.330 uIU/mL Final    WBC 04/16/2023 4.0  3.8 - 11.8 x10^3/uL Final    RBC 04/16/2023 4.64  3.63 - 4.92 x10^6/uL Final    HGB 04/16/2023 14.0  10.9 - 14.3 g/dL Final    HCT 84/69/6295 41.3  31.2 - 41.9 % Final    MCV 04/16/2023 89.0  75.5 - 95.3 fL Final    MCH 04/16/2023 30.2  24.7 - 32.8 pg Final    MCHC 04/16/2023 33.9  32.3 - 35.6 g/dL Final    RDW 28/41/3244 13.7  12.3 - 17.7 % Final    PLATELETS 04/16/2023 132 (L)  140 - 440 x10^3/uL Final    MPV 04/16/2023 10.2  7.9 - 10.8 fL Final     NEUTROPHIL % 04/16/2023 54  43 - 77 % Final    LYMPHOCYTE % 04/16/2023 32  16 - 44 % Final    MONOCYTE % 04/16/2023 10  5 - 13 % Final    EOSINOPHIL % 04/16/2023 4  1 - 7 % Final    BASOPHIL % 04/16/2023 1  0 - 1 % Final    NEUTROPHIL # 04/16/2023 2.10  1.85 - 7.80 x10^3/uL Final    LYMPHOCYTE # 04/16/2023 1.30  1.00 - 3.00 x10^3/uL Final    MONOCYTE # 04/16/2023 0.40  0.30 - 1.00 x10^3/uL Final    EOSINOPHIL # 04/16/2023 0.20  0.00 - 0.50 x10^3/uL Final    BASOPHIL # 04/16/2023 0.00  0.00 - 0.10 x10^3/uL Final    THYROXINE (T4), FREE 04/16/2023 0.95  0.61 - 1.12 ng/dL Final   Hospital Outpatient Visit on 03/25/2023   Component Date Value Ref Range Status    SODIUM 03/25/2023 141  136 - 145 mmol/L Final    POTASSIUM 03/25/2023 3.9  3.5 - 5.1 mmol/L Final    CHLORIDE 03/25/2023 105  98 - 107 mmol/L Final    CO2 TOTAL 03/25/2023 22  21 - 31 mmol/L Final    ANION GAP 03/25/2023 14 (H)  4 - 13 mmol/L Final    BUN 03/25/2023 13  7 - 25 mg/dL Final    CREATININE 04/23/7251 1.07  0.60 - 1.30 mg/dL Final    BUN/CREA RATIO 03/25/2023 12  6 - 22 Final    ESTIMATED GFR 03/25/2023 62  >59 mL/min/1.47m^2 Final    ALBUMIN 03/25/2023 4.3  3.5 - 5.7 g/dL Final    CALCIUM 66/44/0347 9.6  8.6 - 10.3 mg/dL Final    GLUCOSE 42/59/5638 136 (H)  74 - 109 mg/dL  Final    ALKALINE PHOSPHATASE 03/25/2023 74  34 - 104 U/L Final    ALT (SGPT) 03/25/2023 27  7 - 52 U/L Final    AST (SGOT) 03/25/2023 28  13 - 39 U/L Final    BILIRUBIN TOTAL 03/25/2023 0.8  0.3 - 1.0 mg/dL Final    PROTEIN TOTAL 03/25/2023 7.6  6.4 - 8.9 g/dL Final    ALBUMIN/GLOBULIN RATIO 03/25/2023 1.3  0.8 - 1.4 Final    OSMOLALITY, CALCULATED 03/25/2023 284  270 - 290 mOsm/kg Final    CALCIUM, CORRECTED 03/25/2023 9.4  8.9 - 10.8 mg/dL Final    GLOBULIN 16/01/9603 3.3  2.0 - 3.5 Final    MAGNESIUM 03/25/2023 2.0  1.9 - 2.7 mg/dL Final    TSH 54/12/8117 6.529 (H)  0.450 - 5.330 uIU/mL Final    WBC 03/25/2023 3.9  3.8 - 11.8 x10^3/uL Final    RBC 03/25/2023 4.69  3.63 - 4.92  x10^6/uL Final    HGB 03/25/2023 14.3  10.9 - 14.3 g/dL Final    HCT 14/78/2956 41.9  31.2 - 41.9 % Final    MCV 03/25/2023 89.3  75.5 - 95.3 fL Final    MCH 03/25/2023 30.4  24.7 - 32.8 pg Final    MCHC 03/25/2023 34.1  32.3 - 35.6 g/dL Final    RDW 21/30/8657 14.0  12.3 - 17.7 % Final    PLATELETS 03/25/2023 149  140 - 440 x10^3/uL Final    MPV 03/25/2023 9.7  7.9 - 10.8 fL Final    NEUTROPHIL % 03/25/2023 59  43 - 77 % Final    LYMPHOCYTE % 03/25/2023 28  16 - 44 % Final    MONOCYTE % 03/25/2023 8  5 - 13 % Final    EOSINOPHIL % 03/25/2023 5  1 - 7 % Final    BASOPHIL % 03/25/2023 1  0 - 1 % Final    NEUTROPHIL # 03/25/2023 2.30  1.85 - 7.80 x10^3/uL Final    LYMPHOCYTE # 03/25/2023 1.10  1.00 - 3.00 x10^3/uL Final    MONOCYTE # 03/25/2023 0.30  0.30 - 1.00 x10^3/uL Final    EOSINOPHIL # 03/25/2023 0.20  0.00 - 0.50 x10^3/uL Final    BASOPHIL # 03/25/2023 0.00  0.00 - 0.10 x10^3/uL Final    THYROXINE (T4), FREE 03/25/2023 0.74  0.61 - 1.12 ng/dL Final   Hospital Outpatient Visit on 03/04/2023   Component Date Value Ref Range Status    SIGNATERA TEST RESULT 03/04/2023 NEGATIVE   Final    SIGNATERA MTM READOUT 03/04/2023 0  MTM/ml Final    Comment: Limitations  Signatera is a personalized, tumor-informed test for the longitudinal detection of circulating tumor DNA (ctDNA). Interval testing is recommended for all patients. Studies have demonstrated that when ctDNA is detected (Signatera Positive) following surgery or definitive treatment, the risk for disease relapse is high without further treatment. Conversely, when ctDNA is not detected, the patient may be considered at lower risk for relapse. For those with multiple timepoints, upward trending ctDNA levels are suggestive of increasing tumor burden (1,2). For a single time point in isolation, the absolute MTM/mL value has no known clinical significance and should not be compared across patients. Test results should be interpreted in context of other  clinicopathological features. ctDNA detection sensitivity may be limited due to blood collection within two weeks of surgery and while the patient is on therapy. Signatera is a quantitative test and reports in units of mean tumor  molecules per   ml (MTM/mL), which is comprised of three measured components (plasma volume, cell free DNA (cfDNA) concentration, and Variant Allele Frequency (VAF)). The MTM/mL number will be qualified if any measured component falls outside the analytical measurement range for that component. The analytical sensitivity is 95% at the limit of detection (0.3 MTM/mL). Results obtained are specific to the assessed time point. A negative test result does not definitively indicate the absence of cancer. This test is not designed to detect or report germline variation, nor does it infer hereditary cancer risk for the patient. Each Signatera assay is designed to a single tumor for a given patient. At this time, multiple personalized Signatera assays cannot be developed for the same patient. This test is designed to detect ctDNA from the assayed tumor only; new primary tumors will not be detected. There is a low risk that a new primary may share a variant that could interfere with the                            Signatera test.   Testing cannot be performed in patients who are pregnant, have a history of bone marrow transplant, or history of blood transfusion within three months. This test is expected to have limited sensitivity in cancer types such as GIST, renal cell carcinomas, primary brain tumors, and lymphoma due to limited ctDNA shed. 1 Bratman SV, Feliciana Horn, Iafolla MAJ, et al. Personalized circulating tumor DNA analysis as a predictive biomarker in solid tumor patients treated with pembrolizumab . Nature Cancer. 2020;1(9):873-881. 2 Henriksen TV, Tarazona N, et al., Circulating Tumor DNA in Stage III Colorectal Cancer, beyond Minimal Residual Disease Detection,  toward Assessment of Adjuvant Therapy Efficacy and Clinical Behavior of Recurrences. Clin Cancer Res. 2021; 28(3):507-517.  Methodology  FFPE samples are assessed by a pathologist to identify tumor margins and percent tumor content. Tumor DNA is extracted using Qiagen AllPrep. Whole genomic DNA is isolated from peripheral blood                            using QIAamp DNA Blood Mini Kit to provide a baseline DNA sequence. Circulating tumor DNA (ctDNA) is extracted from plasma derived from whole blood samples collected in cell-free DNA blood tubes (Streck) using the QIAsymphony automated or manual extraction method (Qiagen). Using a proprietary algorithm, putative, clonal variants present in the tumor but absent in the germline DNA are identified to design the customized multiplex PCR assay. Whole-exome sequencing is performed on tumor and peripheral blood DNA using KAPA HyperPrep Warden/ranger (Roche) with a custom xGen exome capture (IDT). Using a proprietary algorithm, putative clonal variants present in the tumor but absent in the germline DNA are identified to design the customized multiplex PCR assay. Circulating tumor DNA is extracted from   plasma collected in Streck tubes using [employer] proprietary methods. The customized PCR assays are run to detect presence or absence of these variants within circulating plasma. A patient's                            plasma sample is considered ctDNA positive when at least two individual-specific tumor variants are detected. When fewer than two individual-specific tumor variants are observed, a negative result is issued. Tumor variation outside of the individual, tumor specific variants is not assessed. Pathology services and whole exome sequencing are performed at [facility] (  CLIA ID# [id number]) [address] [place], [place] [address].  Disclaimer  The extraction, Designer, television/film set, and sequencing for this test were performed by [facility] [address], [place], [place] [address] (CLIA [place] [id number]). The data analysis and reporting for this test were performed by [facility] [address], [place], [place] [address] (CLIA [place] [id number]). This test was developed and its performance characteristics determined by [facility]. The test has not been cleared or approved by the U.S. Food and Drug Administration (FDA). CAP accredited, ISO 13485 certified, and CLIA certified.                            Pathology services and whole exome sequencing for this test were performed by [facility], [address], [place] [address], CLIA # [id number].  2021 [employer] All Rights Reserved.    SODIUM 03/04/2023 141  136 - 145 mmol/L Final    POTASSIUM 03/04/2023 4.0  3.5 - 5.1 mmol/L Final    CHLORIDE 03/04/2023 105  98 - 107 mmol/L Final    CO2 TOTAL 03/04/2023 27  21 - 31 mmol/L Final    ANION GAP 03/04/2023 9  4 - 13 mmol/L Final    BUN 03/04/2023 9  7 - 25 mg/dL Final    CREATININE 73/22/0254 1.06  0.60 - 1.30 mg/dL Final    BUN/CREA RATIO 03/04/2023 8  6 - 22 Final    ESTIMATED GFR 03/04/2023 63  >59 mL/min/1.81m^2 Final    ALBUMIN 03/04/2023 4.5  3.5 - 5.7 g/dL Final    CALCIUM 27/09/2374 9.9  8.6 - 10.3 mg/dL Final    GLUCOSE 28/31/5176 75  74 - 109 mg/dL Final    ALKALINE PHOSPHATASE 03/04/2023 72  34 - 104 U/L Final    ALT (SGPT) 03/04/2023 23  7 - 52 U/L Final    AST (SGOT) 03/04/2023 23  13 - 39 U/L Final    BILIRUBIN TOTAL 03/04/2023 0.9  0.3 - 1.0 mg/dL Final    PROTEIN TOTAL 03/04/2023 7.8  6.4 - 8.9 g/dL Final    ALBUMIN/GLOBULIN RATIO 03/04/2023 1.4  0.8 - 1.4 Final    OSMOLALITY, CALCULATED 03/04/2023 279  270 - 290 mOsm/kg Final    CALCIUM, CORRECTED 03/04/2023 9.5  8.9 - 10.8 mg/dL Final    GLOBULIN 16/10/3708 3.3  2.0 - 3.5 Final    MAGNESIUM 03/04/2023 2.1  1.9 - 2.7 mg/dL Final    TSH 62/69/4854 5.092  0.450 - 5.330 uIU/mL Final    WBC 03/04/2023 4.5  3.8 - 11.8 x10^3/uL Final    RBC 03/04/2023 4.91  3.63 - 4.92 x10^6/uL Final    HGB 03/04/2023 15.2 (H)   10.9 - 14.3 g/dL Final    HCT 62/70/3500 43.6 (H)  31.2 - 41.9 % Final    MCV 03/04/2023 88.8  75.5 - 95.3 fL Final    MCH 03/04/2023 30.9  24.7 - 32.8 pg Final    MCHC 03/04/2023 34.8  32.3 - 35.6 g/dL Final    RDW 93/81/8299 13.5  12.3 - 17.7 % Final    PLATELETS 03/04/2023 191  140 - 440 x10^3/uL Final    MPV 03/04/2023 9.6  7.9 - 10.8 fL Final    NEUTROPHIL % 03/04/2023 61  43 - 77 % Final    LYMPHOCYTE % 03/04/2023 24  16 - 44 % Final    MONOCYTE % 03/04/2023 10  5 -  13 % Final    EOSINOPHIL % 03/04/2023 5  1 - 7 % Final    BASOPHIL % 03/04/2023 0  0 - 1 % Final    NEUTROPHIL # 03/04/2023 2.80  1.85 - 7.80 x10^3/uL Final    LYMPHOCYTE # 03/04/2023 1.10  1.00 - 3.00 x10^3/uL Final    MONOCYTE # 03/04/2023 0.40  0.30 - 1.00 x10^3/uL Final    EOSINOPHIL # 03/04/2023 0.20  0.00 - 0.50 x10^3/uL Final    BASOPHIL # 03/04/2023 0.00  0.00 - 0.10 x10^3/uL Final    THYROXINE (T4), FREE 03/04/2023 1.08  0.61 - 1.12 ng/dL Final   Office Visit on 02/24/2023   Component Date Value Ref Range Status    SARS-CoV-2 02/24/2023 Not Detected  Not Detected Final    INFLUENZA VIRUS TYPE A 02/24/2023 Not Detected  Not Detected Final    INFLUENZA VIRUS TYPE B 02/24/2023 Not Detected  Not Detected Final    RESPIRATORY SYNCTIAL VIRUS (RSV) 02/24/2023 Not Detected  Not Detected Final    THROAT RAPID SCREEN, STREPTOCOCCUS 02/24/2023 Negative  Negative Final    THROAT CULTURE 02/24/2023 Normal Commensal Flora   Final   There may be more visits with results that are not included.       Assessment & Plan:       ICD-10-CM    1. Annual physical exam  Z00.00 Preventative health care discussed.     Patient has had total hysterectomy so no longer in need of pap smears and has no GU complaints.   Order placed for mammogram.   Colonoscopy UTD - last one 3 years ago with Dr. Doyne Genin.   Patient has labs scheduled to be performed with oncology next week, so no labs performed today.   Physical exam WNL except as mentioned above.       2. Encounter for  other screening for malignant neoplasm of breast  Z12.39 MAMMO BILATERAL SCREENING      3. Acquired hypothyroidism  E03.9       4. Vitamin D  deficiency  E55.9       5. Gastroesophageal reflux disease without esophagitis  K21.9       6. Primary hypertension  I10         Orders Placed This Encounter    MAMMO BILATERAL SCREENING                   Health Maintenance   Topic Date Due    Breast Cancer Screening  Never done    Colonoscopy  Never done    Covid-19 Vaccine (4 - 2024-25 season) 11/16/2023 (Originally 12/21/2022)    Adult Tdap-Td (1 - Tdap) 08/17/2024 (Originally 05/30/1989)    Hepatitis B Vaccine (1 of 3 - 19+ 3-dose series) 08/17/2024 (Originally [birth year]/30/1988)    Hepatitis C screening  08/17/2024 (Originally [birth year])    Shingles Vaccine (1 of 2) 08/17/2024 (Originally 05/30/1988)    Pneumococcal Vaccination, Age 30+ (1 of 2 - PCV) 08/17/2024 (Originally 05/30/1988)    Depression Screening  04/07/2024    NonMedicare Preventative Exam  08/17/2024    Influenza Vaccine  Completed    HIV Screening  Discontinued       Return in about 6 months (around 02/17/2024), or if symptoms worsen or fail to improve.    The patient has been educated and verbalized understanding regarding the services provided during this visit.    Randa Burton, FNP-C @ on 08/18/23 at 10:03.

## 2023-08-19 ENCOUNTER — Other Ambulatory Visit: Payer: Self-pay

## 2023-08-19 ENCOUNTER — Encounter (INDEPENDENT_AMBULATORY_CARE_PROVIDER_SITE_OTHER): Payer: Self-pay | Admitting: HEMATOLOGY-ONCOLOGY

## 2023-08-20 ENCOUNTER — Ambulatory Visit
Admission: RE | Admit: 2023-08-20 | Discharge: 2023-08-20 | Disposition: A | Discharge status: Home / Self Care | Source: Ambulatory Visit | Attending: HEMATOLOGY-ONCOLOGY | Admitting: HEMATOLOGY-ONCOLOGY

## 2023-08-20 ENCOUNTER — Other Ambulatory Visit: Payer: Self-pay

## 2023-08-20 VITALS — BP 143/98 | HR 60 | Temp 97.2°F | Resp 18

## 2023-08-20 DIAGNOSIS — C342 Malignant neoplasm of middle lobe, bronchus or lung: Secondary | ICD-10-CM | POA: Insufficient documentation

## 2023-08-20 DIAGNOSIS — Z5112 Encounter for antineoplastic immunotherapy: Secondary | ICD-10-CM | POA: Insufficient documentation

## 2023-08-20 DIAGNOSIS — C642 Malignant neoplasm of left kidney, except renal pelvis: Secondary | ICD-10-CM

## 2023-08-20 DIAGNOSIS — Z79620 Long term (current) use of immunosuppressive biologic: Secondary | ICD-10-CM | POA: Insufficient documentation

## 2023-08-20 LAB — COMPREHENSIVE METABOLIC PANEL, NON-FASTING
ALBUMIN/GLOBULIN RATIO: 1.5 — ABNORMAL HIGH (ref 0.8–1.4)
ALBUMIN: 4.6 g/dL (ref 3.5–5.7)
ALKALINE PHOSPHATASE: 75 U/L (ref 34–104)
ALT (SGPT): 31 U/L (ref 7–52)
ANION GAP: 6 mmol/L (ref 4–13)
AST (SGOT): 28 U/L (ref 13–39)
BILIRUBIN TOTAL: 0.7 mg/dL (ref 0.3–1.0)
BUN/CREA RATIO: 13 (ref 6–22)
BUN: 16 mg/dL (ref 7–25)
CALCIUM, CORRECTED: 9.4 mg/dL (ref 8.9–10.8)
CALCIUM: 9.9 mg/dL (ref 8.6–10.3)
CHLORIDE: 107 mmol/L (ref 98–107)
CO2 TOTAL: 28 mmol/L (ref 21–31)
CREATININE: 1.23 mg/dL (ref 0.60–1.30)
ESTIMATED GFR: 52 mL/min/{1.73_m2} — ABNORMAL LOW (ref >59)
GLOBULIN: 3 (ref 2.0–3.5)
GLUCOSE: 81 mg/dL (ref 74–109)
OSMOLALITY, CALCULATED: 282 mosm/kg (ref 270–290)
POTASSIUM: 4 mmol/L (ref 3.5–5.1)
PROTEIN TOTAL: 7.6 g/dL (ref 6.4–8.9)
SODIUM: 141 mmol/L (ref 136–145)

## 2023-08-20 LAB — CBC WITH DIFF
BASOPHIL #: 0 10*3/uL (ref 0.00–0.10)
BASOPHIL %: 1 % (ref 0–1)
EOSINOPHIL #: 0.1 10*3/uL (ref 0.00–0.50)
EOSINOPHIL %: 3 % (ref 1–7)
HCT: 42.4 % — ABNORMAL HIGH (ref 31.2–41.9)
HGB: 14.3 g/dL (ref 10.9–14.3)
LYMPHOCYTE #: 1.3 10*3/uL (ref 1.10–3.10)
LYMPHOCYTE %: 29 % (ref 16–46)
MCH: 30.1 pg (ref 24.7–32.8)
MCHC: 33.8 g/dL (ref 32.3–35.6)
MCV: 89.1 fL (ref 75.5–95.3)
MONOCYTE #: 0.4 10*3/uL (ref 0.20–0.90)
MONOCYTE %: 10 % (ref 4–11)
MPV: 9.9 fL (ref 7.9–10.8)
NEUTROPHIL #: 2.5 10*3/uL (ref 1.90–8.20)
NEUTROPHIL %: 57 % (ref 43–77)
PLATELETS: 146 10*3/uL (ref 140–440)
RBC: 4.76 10*6/uL (ref 3.63–4.92)
RDW: 14 % (ref 12.3–17.7)
WBC: 4.5 10*3/uL (ref 3.8–11.8)

## 2023-08-20 LAB — THYROID STIMULATING HORMONE WITH FREE T4 REFLEX: TSH: 2.768 u[IU]/mL (ref 0.450–5.330)

## 2023-08-20 LAB — MAGNESIUM: MAGNESIUM: 2 mg/dL (ref 1.9–2.7)

## 2023-08-20 MED ORDER — ALBUTEROL SULFATE 2.5 MG/3 ML (0.083 %) SOLUTION FOR NEBULIZATION
2.5000 mg | INHALATION_SOLUTION | Freq: Once | RESPIRATORY_TRACT | Status: DC | PRN
Start: 2023-08-20 — End: 2023-08-21

## 2023-08-20 MED ORDER — ONDANSETRON HCL 8 MG TABLET
8.0000 mg | ORAL_TABLET | Freq: Once | ORAL | Status: DC | PRN
Start: 2023-08-20 — End: 2023-08-21

## 2023-08-20 MED ORDER — DEXTROSE 5% IN WATER (D5W) FLUSH BAG - 250 ML
INTRAVENOUS | Status: DC | PRN
Start: 2023-08-20 — End: 2023-08-21

## 2023-08-20 MED ORDER — ONDANSETRON HCL (PF) 4 MG/2 ML INJECTION SOLUTION
8.0000 mg | Freq: Once | INTRAMUSCULAR | Status: DC | PRN
Start: 2023-08-20 — End: 2023-08-21

## 2023-08-20 MED ORDER — MEPERIDINE (PF) 25 MG/ML INJECTION SOLUTION
12.5000 mg | Freq: Once | INTRAMUSCULAR | Status: DC | PRN
Start: 2023-08-20 — End: 2023-08-21

## 2023-08-20 MED ORDER — SODIUM CHLORIDE 0.9% FLUSH BAG - 250 ML
INTRAVENOUS | Status: DC | PRN
Start: 2023-08-20 — End: 2023-08-21

## 2023-08-20 MED ORDER — EPINEPHRINE 1 MG/ML (1 ML) INJECTION SOLUTION
0.3000 mg | Freq: Once | INTRAMUSCULAR | Status: DC | PRN
Start: 2023-08-20 — End: 2023-08-21

## 2023-08-20 MED ORDER — DIPHENHYDRAMINE 50 MG/ML INJECTION SOLUTION
50.0000 mg | Freq: Once | INTRAMUSCULAR | Status: DC | PRN
Start: 2023-08-20 — End: 2023-08-21

## 2023-08-20 MED ORDER — DIPHENHYDRAMINE 50 MG/ML INJECTION SOLUTION
25.0000 mg | Freq: Once | INTRAMUSCULAR | Status: DC | PRN
Start: 2023-08-20 — End: 2023-08-21

## 2023-08-20 MED ORDER — FAMOTIDINE (PF) 20 MG/2 ML INTRAVENOUS SOLUTION
20.0000 mg | Freq: Once | INTRAVENOUS | Status: DC | PRN
Start: 2023-08-20 — End: 2023-08-21

## 2023-08-20 MED ORDER — HYDROCORTISONE SOD SUCCINATE 100 MG/2 ML VIAL WRAPPER
100.0000 mg | Freq: Once | INTRAMUSCULAR | Status: DC | PRN
Start: 2023-08-20 — End: 2023-08-21

## 2023-08-20 MED ORDER — SODIUM CHLORIDE 0.9 % INTRAVENOUS SOLUTION
200.0000 mg | Freq: Once | INTRAVENOUS | Status: AC
Start: 2023-08-20 — End: 2023-08-20
  Administered 2023-08-20: 0 mg via INTRAVENOUS
  Administered 2023-08-20: 200 mg via INTRAVENOUS
  Filled 2023-08-20: qty 8

## 2023-08-20 MED ORDER — ALBUTEROL SULFATE HFA 90 MCG/ACTUATION AEROSOL INHALER - RN
2.0000 | Freq: Once | RESPIRATORY_TRACT | Status: DC | PRN
Start: 2023-08-20 — End: 2023-08-21

## 2023-08-20 NOTE — Nurses Notes (Signed)
 1610: Patient arrived ambulatory. VSS. Assessments WNL. No complaints voiced. Malva Search, RN  [phone]: IV access established in RW.Malva Search, RN  [phone]: 200 mg Keytruda  infused over 30 minutes per pharmacy.Malva Search, RN  [phone]: Infusion and flush complete. VSS. IV removed with catheter intact. Malva Search, RN  [phone]: Patient left unit ambulatory in no new reported distress. Malva Search, RN

## 2023-09-05 NOTE — Progress Notes (Signed)
 [facility]  HEMATOLOGY/ONCOLOGY, [facility]  [address] [place] [address]  [phone]   Hematology/Oncology    Clinic Progress Note      Taylor Smith, Taylor Smith, 53 y.o. female  Date of Birth:  [birth year]    Referral Physician:Taylor Smith    Chief Complaint:   RCC    HPI:  Patient is a 53 year old female with PMH of hypertension, gastroesophageal reflux disease, hypothyroidism, status post hysterectomy, follow-up for RCC    Current Treatment:.  Pembrolizumab  q. 3 weeks    Past Treatment:    Social History/Family History:  As per previous H and P    Review of Systems:   Complain from occasional pain/muscle spasm in the left flank area status post left nephrectomy.  Rest of 12 point review of system reportedly negative    BP (!) 135/97 (Site: Right Arm, Patient Position: Sitting, Cuff Size: Adult Large)   Pulse 80   Temp 36.8 C (98.3 F) (Temporal)   Wt 105 kg (230 lb 14.4 oz)   SpO2 100%   BMI 34.08 kg/m          Body mass index is 34.08 kg/m.     ECOG:1    Physical Exam:     Physical Exam  Vitals and nursing note reviewed.   Constitutional:       Appearance: Normal appearance.   HENT:      Head: Normocephalic and atraumatic.      Nose: Nose normal.      Mouth/Throat:      Mouth: Mucous membranes are moist.      Pharynx: Oropharynx is clear.   Eyes:      Extraocular Movements: Extraocular movements intact.   Cardiovascular:      Rate and Rhythm: Normal rate and regular rhythm.      Pulses: Normal pulses.      Heart sounds: Normal heart sounds.   Pulmonary:      Effort: Pulmonary effort is normal.      Breath sounds: Normal breath sounds.   Abdominal:      General: Abdomen is flat.      Palpations: Abdomen is soft.   Musculoskeletal:         General: Normal range of motion.      Cervical back: Normal range of motion and neck supple.   Skin:     General: Skin is warm and dry.   Neurological:      General: No focal deficit present.      Mental Status: She is alert  and oriented to person, place, and time.   Psychiatric:         Mood and Affect: Mood normal.         Behavior: Behavior normal.               IMPRESSION:  1- RCC, stage IV;  On 12/2021:A. 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   B. Uterus, cervix and left fallopian tube and ovary, total hysterectomy and left salpingo-oophorectomy:[-] Cancer..  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   Surgical margins of resection are negative for carcinoma.   Tumor invades renal vein, renal pelvis, and perirenal fat (pT3a).  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  EGD: 04/2023 biopsy of the gastric antrum negative.      PLAN:  Continue pembrolizumab ,  CT chest abdomen and pelvis without contrast      Taylor Feltus, MD       Portions of the current encounter note  have been copied from  the previous encounter which have been reviewed and updated where appropriate and reflects current medical decision making for today's encounter.    This note was partially created using voice recognition software and is inherently subject to errors  including those of syntax and "sound alike" substitutions which may escape  proof  reading.  In such  instances, original meaning may be extrapolated by contextual derivation.  Please notify any unusual errors in this record at   [phone] so these may be corrected and resolved.

## 2023-09-08 ENCOUNTER — Ambulatory Visit (HOSPITAL_COMMUNITY)

## 2023-09-09 ENCOUNTER — Other Ambulatory Visit: Payer: Self-pay

## 2023-09-09 ENCOUNTER — Other Ambulatory Visit (INDEPENDENT_AMBULATORY_CARE_PROVIDER_SITE_OTHER): Payer: Self-pay | Admitting: HEMATOLOGY-ONCOLOGY

## 2023-09-09 ENCOUNTER — Encounter (INDEPENDENT_AMBULATORY_CARE_PROVIDER_SITE_OTHER): Payer: Self-pay | Admitting: HEMATOLOGY-ONCOLOGY

## 2023-09-10 ENCOUNTER — Encounter (INDEPENDENT_AMBULATORY_CARE_PROVIDER_SITE_OTHER): Payer: Self-pay

## 2023-09-10 ENCOUNTER — Other Ambulatory Visit: Payer: Self-pay

## 2023-09-10 ENCOUNTER — Ambulatory Visit
Admission: RE | Admit: 2023-09-10 | Discharge: 2023-09-10 | Disposition: A | Discharge status: Home / Self Care | Source: Ambulatory Visit

## 2023-09-10 ENCOUNTER — Ambulatory Visit (INDEPENDENT_AMBULATORY_CARE_PROVIDER_SITE_OTHER)
Admission: RE | Admit: 2023-09-10 | Discharge: 2023-09-10 | Disposition: A | Discharge status: Home / Self Care | Payer: Self-pay | Source: Ambulatory Visit

## 2023-09-10 ENCOUNTER — Ambulatory Visit (HOSPITAL_BASED_OUTPATIENT_CLINIC_OR_DEPARTMENT_OTHER): Payer: Self-pay

## 2023-09-10 VITALS — BP 135/97 | HR 80 | Temp 98.3°F | Wt 230.9 lb

## 2023-09-10 VITALS — BP 149/90 | HR 56 | Temp 97.0°F | Resp 18

## 2023-09-10 DIAGNOSIS — Z90721 Acquired absence of ovaries, unilateral: Secondary | ICD-10-CM | POA: Insufficient documentation

## 2023-09-10 DIAGNOSIS — Z9079 Acquired absence of other genital organ(s): Secondary | ICD-10-CM | POA: Insufficient documentation

## 2023-09-10 DIAGNOSIS — C642 Malignant neoplasm of left kidney, except renal pelvis: Secondary | ICD-10-CM

## 2023-09-10 DIAGNOSIS — Z5112 Encounter for antineoplastic immunotherapy: Secondary | ICD-10-CM | POA: Insufficient documentation

## 2023-09-10 DIAGNOSIS — Z90710 Acquired absence of both cervix and uterus: Secondary | ICD-10-CM | POA: Insufficient documentation

## 2023-09-10 DIAGNOSIS — C799 Secondary malignant neoplasm of unspecified site: Secondary | ICD-10-CM | POA: Insufficient documentation

## 2023-09-10 DIAGNOSIS — Z905 Acquired absence of kidney: Secondary | ICD-10-CM | POA: Insufficient documentation

## 2023-09-10 DIAGNOSIS — Z79620 Long term (current) use of immunosuppressive biologic: Secondary | ICD-10-CM | POA: Insufficient documentation

## 2023-09-10 DIAGNOSIS — Z8544 Personal history of malignant neoplasm of other female genital organs: Secondary | ICD-10-CM | POA: Insufficient documentation

## 2023-09-10 DIAGNOSIS — Z9889 Other specified postprocedural states: Secondary | ICD-10-CM | POA: Insufficient documentation

## 2023-09-10 LAB — CBC WITH DIFF
BASOPHIL #: 0 10*3/uL (ref 0.00–0.10)
BASOPHIL %: 1 % (ref 0–1)
EOSINOPHIL #: 0.1 10*3/uL (ref 0.00–0.50)
EOSINOPHIL %: 3 % (ref 1–7)
HCT: 41.5 % (ref 31.2–41.9)
HGB: 14.3 g/dL (ref 10.9–14.3)
LYMPHOCYTE #: 1.1 10*3/uL (ref 1.10–3.10)
LYMPHOCYTE %: 28 % (ref 16–46)
MCH: 30.6 pg (ref 24.7–32.8)
MCHC: 34.6 g/dL (ref 32.3–35.6)
MCV: 88.5 fL (ref 75.5–95.3)
MONOCYTE #: 0.3 10*3/uL (ref 0.20–0.90)
MONOCYTE %: 9 % (ref 4–11)
MPV: 10 fL (ref 7.9–10.8)
NEUTROPHIL #: 2.4 10*3/uL (ref 1.90–8.20)
NEUTROPHIL %: 60 % (ref 43–77)
PLATELETS: 133 10*3/uL — ABNORMAL LOW (ref 140–440)
RBC: 4.69 10*6/uL (ref 3.63–4.92)
RDW: 13.6 % (ref 12.3–17.7)
WBC: 3.9 10*3/uL (ref 3.8–11.8)

## 2023-09-10 LAB — COMPREHENSIVE METABOLIC PANEL, NON-FASTING
ALBUMIN/GLOBULIN RATIO: 1.6 — ABNORMAL HIGH (ref 0.8–1.4)
ALBUMIN: 4.5 g/dL (ref 3.5–5.7)
ALKALINE PHOSPHATASE: 70 U/L (ref 34–104)
ALT (SGPT): 34 U/L (ref 7–52)
ANION GAP: 7 mmol/L (ref 4–13)
AST (SGOT): 30 U/L (ref 13–39)
BILIRUBIN TOTAL: 0.8 mg/dL (ref 0.3–1.0)
BUN/CREA RATIO: 11 (ref 6–22)
BUN: 12 mg/dL (ref 7–25)
CALCIUM, CORRECTED: 9 mg/dL (ref 8.9–10.8)
CALCIUM: 9.4 mg/dL (ref 8.6–10.3)
CHLORIDE: 107 mmol/L (ref 98–107)
CO2 TOTAL: 27 mmol/L (ref 21–31)
CREATININE: 1.12 mg/dL (ref 0.60–1.30)
ESTIMATED GFR: 58 mL/min/{1.73_m2} — ABNORMAL LOW (ref >59)
GLOBULIN: 2.9 (ref 2.0–3.5)
GLUCOSE: 88 mg/dL (ref 74–109)
OSMOLALITY, CALCULATED: 281 mosm/kg (ref 270–290)
POTASSIUM: 3.6 mmol/L (ref 3.5–5.1)
PROTEIN TOTAL: 7.4 g/dL (ref 6.4–8.9)
SODIUM: 141 mmol/L (ref 136–145)

## 2023-09-10 LAB — THYROID STIMULATING HORMONE WITH FREE T4 REFLEX: TSH: 1.233 u[IU]/mL (ref 0.450–5.330)

## 2023-09-10 LAB — MAGNESIUM: MAGNESIUM: 1.8 mg/dL — ABNORMAL LOW (ref 1.9–2.7)

## 2023-09-10 MED ORDER — ONDANSETRON HCL 8 MG TABLET
8.0000 mg | ORAL_TABLET | Freq: Once | ORAL | Status: DC | PRN
Start: 2023-09-10 — End: 2023-09-11

## 2023-09-10 MED ORDER — SODIUM CHLORIDE 0.9% FLUSH BAG - 250 ML
INTRAVENOUS | Status: DC | PRN
Start: 2023-09-10 — End: 2023-09-11

## 2023-09-10 MED ORDER — DIPHENHYDRAMINE 50 MG/ML INJECTION SOLUTION
50.0000 mg | Freq: Once | INTRAMUSCULAR | Status: DC | PRN
Start: 2023-09-10 — End: 2023-09-11

## 2023-09-10 MED ORDER — FAMOTIDINE (PF) 20 MG/2 ML INTRAVENOUS SOLUTION
20.0000 mg | Freq: Once | INTRAVENOUS | Status: DC | PRN
Start: 2023-09-10 — End: 2023-09-11

## 2023-09-10 MED ORDER — SODIUM CHLORIDE 0.9 % INTRAVENOUS SOLUTION
200.0000 mg | Freq: Once | INTRAVENOUS | Status: AC
Start: 2023-09-10 — End: 2023-09-10
  Administered 2023-09-10: 200 mg via INTRAVENOUS
  Administered 2023-09-10: 0 mg via INTRAVENOUS
  Filled 2023-09-10: qty 8

## 2023-09-10 MED ORDER — ALBUTEROL SULFATE 2.5 MG/3 ML (0.083 %) SOLUTION FOR NEBULIZATION
2.5000 mg | INHALATION_SOLUTION | Freq: Once | RESPIRATORY_TRACT | Status: DC | PRN
Start: 2023-09-10 — End: 2023-09-11

## 2023-09-10 MED ORDER — ONDANSETRON HCL (PF) 4 MG/2 ML INJECTION SOLUTION
8.0000 mg | Freq: Once | INTRAMUSCULAR | Status: DC | PRN
Start: 2023-09-10 — End: 2023-09-11

## 2023-09-10 MED ORDER — ALBUTEROL SULFATE HFA 90 MCG/ACTUATION AEROSOL INHALER - RN
2.0000 | Freq: Once | RESPIRATORY_TRACT | Status: DC | PRN
Start: 2023-09-10 — End: 2023-09-11

## 2023-09-10 MED ORDER — HYDROCORTISONE SOD SUCCINATE 100 MG/2 ML VIAL WRAPPER
100.0000 mg | Freq: Once | INTRAMUSCULAR | Status: DC | PRN
Start: 2023-09-10 — End: 2023-09-11

## 2023-09-10 MED ORDER — MEPERIDINE (PF) 25 MG/ML INJECTION SOLUTION
12.5000 mg | Freq: Once | INTRAMUSCULAR | Status: DC | PRN
Start: 2023-09-10 — End: 2023-09-11

## 2023-09-10 MED ORDER — DEXTROSE 5% IN WATER (D5W) FLUSH BAG - 250 ML
INTRAVENOUS | Status: DC | PRN
Start: 2023-09-10 — End: 2023-09-11

## 2023-09-10 MED ORDER — EPINEPHRINE 1 MG/ML (1 ML) INJECTION SOLUTION
0.3000 mg | Freq: Once | INTRAMUSCULAR | Status: DC | PRN
Start: 2023-09-10 — End: 2023-09-11

## 2023-09-10 MED ORDER — DIPHENHYDRAMINE 50 MG/ML INJECTION SOLUTION
25.0000 mg | Freq: Once | INTRAMUSCULAR | Status: DC | PRN
Start: 2023-09-10 — End: 2023-09-11

## 2023-09-10 NOTE — Nurses Notes (Addendum)
 [id number]- Arrived to unit ambulatory. Here for Keytruda  infusion. Seen provider at [facility] clinic. Labs, weight, VS obtained there. All assessments complete. VSS. Lungs clear, denies shortness of breath. Reports generalized joint pain that seems to be worsening.   Patient Assessment/Symptom Management Patient Has No MD Appointment Today   Key: (+) Symptom present           (-)  Symptom not present If Symptom is Positive(+) a Nursing Note is required   Edema -   Uncontrolled Nausea -   Vomiting -   Inability to eat/drink -   Mouth Sores -   Diarrhea + chronic, worsening   Constipation (? Last BM) -   Fatigue that interferes with ADL's - mild fatigue   Numbness/Tingling -change Mild numbness in hands   Other -   Fever/Signs & Symptoms of infection -   Nurse Initials VB   1030- Peripheral IV started to right wrist with first attempt, tolerated well. [place] Barbara, RN   [phone]- Flushed IV before starting Keytruda , no blood return. Upon flushing, puffiness noted, IV removed. Restarted to left inner arm by Audree Bless, blood return noted. Keytruda  200 mg infusion started. [place] Barbara, RN  1126- Keytruda  infusion complete, line flushing. Tolerated well. [place] Barbara, RN    1150- VSS. Peripheral IV removed, pressure dressing applied. No complaints voiced. Left unit ambulatory. Nevada Barbara, RN

## 2023-09-10 NOTE — Nursing Note (Signed)
 The patient was triaged today and placed in room to be seen by Dr. Evelyn Hire for kidney cancer. V/S were reviewed, all current laboratory studies, advanced directives and medications reviewed at bedside. She is good on her Mammo and colonoscopy for the time being. She is on Pembrolizumab  and I filled out the side effect assessment form. Dr. Evelyn Hire ordered a CT C/A/P w/o contrast to be done. We will see her back in the clinic in 3 weeks.

## 2023-09-10 NOTE — Nursing Note (Signed)
 Signatera mailed at this time.

## 2023-09-16 ENCOUNTER — Encounter (INDEPENDENT_AMBULATORY_CARE_PROVIDER_SITE_OTHER): Payer: Self-pay | Admitting: HEMATOLOGY-ONCOLOGY

## 2023-09-16 LAB — SIGNATERA ONLY
SIGNATERA MTM READOUT: 0 MTM/ml
SIGNATERA TEST RESULT: NEGATIVE

## 2023-09-22 ENCOUNTER — Telehealth (INDEPENDENT_AMBULATORY_CARE_PROVIDER_SITE_OTHER): Payer: Self-pay

## 2023-09-22 NOTE — Telephone Encounter (Signed)
 Called patient and talked to her about the CT cap and she said that [facility] had ordered 1 but they aren't going to do it, so she called the insurance company and they are going to reverse that so that we can do it.

## 2023-09-23 ENCOUNTER — Encounter (INDEPENDENT_AMBULATORY_CARE_PROVIDER_SITE_OTHER): Payer: Self-pay | Admitting: HEMATOLOGY-ONCOLOGY

## 2023-09-25 ENCOUNTER — Telehealth (INDEPENDENT_AMBULATORY_CARE_PROVIDER_SITE_OTHER): Payer: Self-pay | Admitting: NURSE PRACTITIONER

## 2023-09-25 NOTE — Telephone Encounter (Signed)
 Per Sherrlyn Dolores the pts insurance provider called and wanted to see if they could ship the pts Keytruda  from their specialty pharmacy to our pharmacy so the pt could receive medication thru her insurance.  I sent a secure chat to Aretta Kudo and Raynell Caller to verify if this was allowed, [facility] stated they would be able to accept.  I called [employer] in and left message for  case manager Antoinette last week.  I called again yesterday and left a VM, received a VM today from [place] stating that Antionette was out until next week and to return call back to her and she would take care of setting up shipment.  I returned call back and got VM so I left message with my phone# and Ext. To call me back today.

## 2023-09-26 ENCOUNTER — Emergency Department
Admission: EM | Admit: 2023-09-26 | Discharge: 2023-09-27 | Disposition: A | Discharge status: Home / Self Care | Attending: Family | Admitting: Family

## 2023-09-26 ENCOUNTER — Encounter (HOSPITAL_BASED_OUTPATIENT_CLINIC_OR_DEPARTMENT_OTHER): Payer: Self-pay

## 2023-09-26 ENCOUNTER — Emergency Department (HOSPITAL_BASED_OUTPATIENT_CLINIC_OR_DEPARTMENT_OTHER)

## 2023-09-26 ENCOUNTER — Other Ambulatory Visit: Payer: Self-pay

## 2023-09-26 DIAGNOSIS — I493 Ventricular premature depolarization: Secondary | ICD-10-CM | POA: Insufficient documentation

## 2023-09-26 DIAGNOSIS — R42 Dizziness and giddiness: Secondary | ICD-10-CM | POA: Insufficient documentation

## 2023-09-26 LAB — CBC WITH DIFF
BASOPHIL #: 0.01 10*3/uL (ref 0.00–0.10)
BASOPHIL %: 0 % (ref 0–1)
EOSINOPHIL #: 0.14 10*3/uL (ref 0.00–0.50)
EOSINOPHIL %: 3 % (ref 1–7)
HCT: 40.6 % (ref 31.2–41.9)
HGB: 14.2 g/dL (ref 10.9–14.3)
LYMPHOCYTE #: 1.42 10*3/uL (ref 1.10–3.10)
LYMPHOCYTE %: 32 % (ref 16–46)
MCH: 31.7 pg (ref 24.7–32.8)
MCHC: 35 g/dL (ref 32.3–35.6)
MCV: 90.4 fL (ref 75.5–95.3)
MONOCYTE #: 0.49 10*3/uL (ref 0.20–0.90)
MONOCYTE %: 11 % (ref 4–11)
MPV: 10 fL (ref 7.9–10.8)
NEUTROPHIL #: 2.36 10*3/uL (ref 1.90–8.20)
NEUTROPHIL %: 53 % (ref 43–77)
PLATELETS: 131 10*3/uL — ABNORMAL LOW (ref 140–440)
RBC: 4.49 10*6/uL (ref 3.63–4.92)
RDW: 15.8 % (ref 12.3–17.7)
WBC: 4.4 10*3/uL (ref 3.8–11.8)

## 2023-09-26 LAB — COMPREHENSIVE METABOLIC PANEL, NON-FASTING
ALBUMIN/GLOBULIN RATIO: 1.2 (ref 0.8–1.4)
ALBUMIN: 4.2 g/dL (ref 3.4–5.0)
ALKALINE PHOSPHATASE: 89 U/L (ref 46–116)
ALT (SGPT): 45 U/L (ref <=78)
ANION GAP: 10 mmol/L (ref 4–13)
AST (SGOT): 32 U/L (ref 15–37)
BILIRUBIN TOTAL: 0.5 mg/dL (ref 0.2–1.0)
BUN/CREA RATIO: 13
BUN: 15 mg/dL (ref 7–18)
CALCIUM, CORRECTED: 9.1 mg/dL
CALCIUM: 9.3 mg/dL (ref 8.5–10.1)
CHLORIDE: 105 mmol/L (ref 98–107)
CO2 TOTAL: 26 mmol/L (ref 21–32)
CREATININE: 1.14 mg/dL — ABNORMAL HIGH (ref 0.55–1.02)
ESTIMATED GFR: 57 mL/min/{1.73_m2} — ABNORMAL LOW (ref >59)
GLOBULIN: 3.6
GLUCOSE: 130 mg/dL — ABNORMAL HIGH (ref 74–106)
OSMOLALITY, CALCULATED: 284 mosm/kg (ref 270–290)
POTASSIUM: 3.7 mmol/L (ref 3.5–5.1)
PROTEIN TOTAL: 7.8 g/dL (ref 6.4–8.2)
SODIUM: 141 mmol/L (ref 136–145)

## 2023-09-26 LAB — TROPONIN-I: TROPONIN I: 3 ng/L (ref <15)

## 2023-09-26 MED ORDER — SODIUM CHLORIDE 0.9 % IV BOLUS
1000.0000 mL | INJECTION | Status: AC
Start: 2023-09-26 — End: 2023-09-26
  Administered 2023-09-26: 0 mL via INTRAVENOUS
  Administered 2023-09-26: 1000 mL via INTRAVENOUS

## 2023-09-26 MED ORDER — MECLIZINE 25 MG TABLET
25.0000 mg | ORAL_TABLET | Freq: Three times a day (TID) | ORAL | 0 refills | Status: DC
Start: 2023-09-26 — End: 2023-10-22

## 2023-09-26 MED ORDER — SODIUM CHLORIDE 0.9 % (FLUSH) INJECTION SYRINGE
3.0000 mL | INJECTION | INTRAMUSCULAR | Status: DC | PRN
Start: 2023-09-26 — End: 2023-09-27

## 2023-09-26 MED ORDER — ONDANSETRON HCL (PF) 4 MG/2 ML INJECTION SOLUTION
INTRAMUSCULAR | Status: AC
Start: 2023-09-26 — End: 2023-09-26
  Filled 2023-09-26: qty 2

## 2023-09-26 MED ORDER — ONDANSETRON 4 MG DISINTEGRATING TABLET
4.0000 mg | ORAL_TABLET | Freq: Three times a day (TID) | ORAL | 0 refills | Status: DC | PRN
Start: 2023-09-26 — End: 2023-12-24

## 2023-09-26 MED ORDER — MECLIZINE 25 MG TABLET
ORAL_TABLET | ORAL | Status: AC
Start: 2023-09-26 — End: 2023-09-26
  Filled 2023-09-26: qty 2

## 2023-09-26 MED ORDER — ONDANSETRON HCL (PF) 4 MG/2 ML INJECTION SOLUTION
4.0000 mg | INTRAMUSCULAR | Status: AC
Start: 2023-09-26 — End: 2023-09-26
  Administered 2023-09-26: 4 mg via INTRAVENOUS

## 2023-09-26 MED ORDER — MECLIZINE 25 MG TABLET
50.0000 mg | ORAL_TABLET | ORAL | Status: AC
Start: 2023-09-26 — End: 2023-09-26
  Administered 2023-09-26: 50 mg via ORAL

## 2023-09-26 MED ORDER — SODIUM CHLORIDE 0.9 % (FLUSH) INJECTION SYRINGE
3.0000 mL | INJECTION | Freq: Three times a day (TID) | INTRAMUSCULAR | Status: DC
Start: 2023-09-27 — End: 2023-09-27

## 2023-09-26 NOTE — ED Provider Notes (Signed)
 [facility], [place] - Emergency Department  ED Primary Note  History of Present Illness   Taylor Smith is a 53 y.o. female who had concerns including Dizziness. Pt states she bent over and got very dizzy. Happened again. Felt like everything was spinning. Got nauseate.denies cp sob nor other co  Review of Systems   Constitutional: No fever, chills or weakness   Skin: No rash or diaphoresis  HENT: No headaches, or congestion  Eyes: No vision changes or photophobia   Cardio: No chest pain, palpitations or leg swelling   Respiratory: No cough, wheezing or SOB  GI:+ nausea, vomiting  no  stool changes  GU:  No dysuria, hematuria, or increased frequency  MSK: No muscle aches, joint or back pain  Neuro: No seizures, LOC, numbness, tingling, or focal weakness+ dizzy   Psychiatric: No depression, SI or substance abuse  All other systems reviewed and are negative.        Physical Exam   ED Triage Vitals [09/26/23 2138]   BP (Non-Invasive) (!) 146/103   Heart Rate 72   Respiratory Rate 17   Temperature 36.2 C (97.2 F)   SpO2 97 %   Weight 97.5 kg (215 lb)   Height 1.753 m (5\' 9" )     Constitutional:  53 y.o. female who appears in no distress. Normal color, no cyanosis.   HENT:   Head: Normocephalic and atraumatic.   Mouth/Throat: Oropharynx is clear and moist.   Eyes: EOMI, PERRL  no nystagmus.   Neck: Trachea midline. Neck supple.  Cardiovascular: RRR, No murmurs, rubs or gallops. Intact distal pulses.  Pulmonary/Chest: BS equal bilaterally. No respiratory distress. No wheezes, rales or chest tenderness.   Abdominal: Bowel sounds present and normal. Abdomen soft, no tenderness, no rebound and no guarding.  Back: No midline spinal tenderness, no paraspinal tenderness, no CVA tenderness.           Musculoskeletal: No edema, tenderness or deformity.  Skin: warm and dry. No rash, erythema, pallor or cyanosis  Psychiatric: normal mood and affect. Behavior is normal.   Neurological: Patient keenly alert  and responsive, easily able to raise eyebrows, facial muscles/expressions symmetric, speaking in fluent sentences, moving all extremities equally and fully, normal gait  Patient Data   Labs Ordered/Reviewed   COMPREHENSIVE METABOLIC PANEL, NON-FASTING - Abnormal; Notable for the following components:       Result Value    CREATININE 1.14 (*)     ESTIMATED GFR 57 (*)     GLUCOSE 130 (*)     All other components within normal limits    Narrative:     Estimated Glomerular Filtration Rate (eGFR) is calculated using the CKD-EPI (2021) equation, intended for patients 60 years of age and older. If gender is not documented or "unknown", there will be no eGFR calculation.   CBC WITH DIFF - Abnormal; Notable for the following components:    PLATELETS 131 (*)     All other components within normal limits   TROPONIN-I - Normal    Narrative:     Values received on females ranging between 12-15 ng/L MUST include the next serial troponin to review changes in the delta differences as the reference range for the Access II chemistry analyzer is lower than the established reference range.     CBC/DIFF    Narrative:     The following orders were created for panel order CBC/DIFF.  Procedure  Abnormality         Status                     ---------                               -----------         ------                     CBC WITH DIFF[[id number]]                Abnormal            Final result                 Please view results for these tests on the individual orders.     CT BRAIN WO IV CONTRAST   Final Result by Edi, Radresults In (06/07 2301)      No acute intracranial findings.               Radiologist location ID: [id number]           Medical Decision Making   Diff dx vertigo. Dehydration TIA.    Ct head neg. No concerning labs. Pt felt better. No nv in er. Steady gait.     Medications Ordered/Administered in the ED   NS flush syringe (has no administration in time range)   NS flush syringe (has no  administration in time range)   meclizine  (ANTIVERT ) tablet (50 mg Oral Given 09/26/23 2251)   NS bolus infusion 1,000 mL (0 mL Intravenous Stopped 09/26/23 2350)   ondansetron  (ZOFRAN ) 2 mg/mL injection (4 mg Intravenous Given 09/26/23 2250)     Clinical Impression   Vertigo (Primary)       Disposition: Discharged

## 2023-09-26 NOTE — Ancillary Notes (Signed)
 09/26/23 2245   EKGs   Start Time 2245   EKG Status Completed   Stop Time 2246   Duration 1 minutes   Paper Tracing Given to copy handed to attending     Copy handed to attending

## 2023-09-26 NOTE — ED Nurses Note (Signed)
 Patient states around 2030 she was laying down and stood up and started to have dizziness. Patient states she went to the kitchen to eat a snack thinking it would make her feel better but continued to have dizziness. Patient states she also has a dull headache and rates it a 3/10

## 2023-09-26 NOTE — Discharge Instructions (Signed)
 Slow position changes. Avoid turning head and bending over. Follow up with ENT. Dont drive or climb until well.

## 2023-09-26 NOTE — ED Nurses Note (Signed)
 Patient ambulated down the hallway and back to her room, patient reports no dizziness with walking. Provided patient with a bottle of water  for PO challenge per provider order.

## 2023-09-27 ENCOUNTER — Other Ambulatory Visit (INDEPENDENT_AMBULATORY_CARE_PROVIDER_SITE_OTHER): Payer: Self-pay | Admitting: NURSE PRACTITIONER

## 2023-09-27 DIAGNOSIS — I493 Ventricular premature depolarization: Secondary | ICD-10-CM

## 2023-09-27 DIAGNOSIS — R9431 Abnormal electrocardiogram [ECG] [EKG]: Secondary | ICD-10-CM

## 2023-09-27 LAB — ECG 12 LEAD
Atrial Rate: 62 {beats}/min
Calculated P Axis: 60 degrees
Calculated R Axis: 59 degrees
Calculated T Axis: 46 degrees
PR Interval: 180 ms
QRS Duration: 116 ms
QT Interval: 460 ms
QTC Calculation: 466 ms
Ventricular rate: 62 {beats}/min

## 2023-09-27 NOTE — ED Nurses Note (Signed)
 Patient able to tolerate water  for PO challenge. No nausea reported. Provider made aware.

## 2023-09-30 ENCOUNTER — Other Ambulatory Visit (INDEPENDENT_AMBULATORY_CARE_PROVIDER_SITE_OTHER): Payer: Self-pay | Admitting: HEMATOLOGY-ONCOLOGY

## 2023-10-01 ENCOUNTER — Ambulatory Visit
Admission: RE | Admit: 2023-10-01 | Discharge: 2023-10-01 | Disposition: A | Discharge status: Home / Self Care | Source: Ambulatory Visit | Attending: HEMATOLOGY-ONCOLOGY | Admitting: HEMATOLOGY-ONCOLOGY

## 2023-10-01 ENCOUNTER — Other Ambulatory Visit: Payer: Self-pay

## 2023-10-01 ENCOUNTER — Encounter (HOSPITAL_COMMUNITY): Payer: Self-pay

## 2023-10-01 ENCOUNTER — Encounter (INDEPENDENT_AMBULATORY_CARE_PROVIDER_SITE_OTHER): Payer: Self-pay | Admitting: HEMATOLOGY-ONCOLOGY

## 2023-10-01 VITALS — BP 151/102 | HR 60 | Temp 97.2°F | Resp 18

## 2023-10-01 DIAGNOSIS — Z5112 Encounter for antineoplastic immunotherapy: Secondary | ICD-10-CM | POA: Insufficient documentation

## 2023-10-01 DIAGNOSIS — C642 Malignant neoplasm of left kidney, except renal pelvis: Secondary | ICD-10-CM | POA: Insufficient documentation

## 2023-10-01 DIAGNOSIS — C799 Secondary malignant neoplasm of unspecified site: Secondary | ICD-10-CM | POA: Insufficient documentation

## 2023-10-01 LAB — COMPREHENSIVE METABOLIC PANEL, NON-FASTING
ALBUMIN/GLOBULIN RATIO: 1.6 — ABNORMAL HIGH (ref 0.8–1.4)
ALBUMIN: 4.6 g/dL (ref 3.5–5.7)
ALKALINE PHOSPHATASE: 67 U/L (ref 34–104)
ALT (SGPT): 31 U/L (ref 7–52)
ANION GAP: 8 mmol/L (ref 4–13)
AST (SGOT): 26 U/L (ref 13–39)
BILIRUBIN TOTAL: 0.9 mg/dL (ref 0.3–1.0)
BUN/CREA RATIO: 11 (ref 6–22)
BUN: 12 mg/dL (ref 7–25)
CALCIUM, CORRECTED: 9 mg/dL (ref 8.9–10.8)
CALCIUM: 9.5 mg/dL (ref 8.6–10.3)
CHLORIDE: 108 mmol/L — ABNORMAL HIGH (ref 98–107)
CO2 TOTAL: 25 mmol/L (ref 21–31)
CREATININE: 1.13 mg/dL (ref 0.60–1.30)
ESTIMATED GFR: 58 mL/min/{1.73_m2} — ABNORMAL LOW (ref >59)
GLOBULIN: 2.8 (ref 2.0–3.5)
GLUCOSE: 93 mg/dL (ref 74–109)
OSMOLALITY, CALCULATED: 281 mosm/kg (ref 270–290)
POTASSIUM: 3.8 mmol/L (ref 3.5–5.1)
PROTEIN TOTAL: 7.4 g/dL (ref 6.4–8.9)
SODIUM: 141 mmol/L (ref 136–145)

## 2023-10-01 LAB — CBC WITH DIFF
BASOPHIL #: 0 10*3/uL (ref 0.00–0.10)
BASOPHIL %: 1 % (ref 0–1)
EOSINOPHIL #: 0.1 10*3/uL (ref 0.00–0.50)
EOSINOPHIL %: 4 % (ref 1–7)
HCT: 43.9 % — ABNORMAL HIGH (ref 31.2–41.9)
HGB: 15.2 g/dL — ABNORMAL HIGH (ref 10.9–14.3)
LYMPHOCYTE #: 1.1 10*3/uL (ref 1.10–3.10)
LYMPHOCYTE %: 28 % (ref 16–46)
MCH: 30.4 pg (ref 24.7–32.8)
MCHC: 34.7 g/dL (ref 32.3–35.6)
MCV: 87.7 fL (ref 75.5–95.3)
MONOCYTE #: 0.3 10*3/uL (ref 0.20–0.90)
MONOCYTE %: 8 % (ref 4–11)
MPV: 10.2 fL (ref 7.9–10.8)
NEUTROPHIL #: 2.3 10*3/uL (ref 1.90–8.20)
NEUTROPHIL %: 60 % (ref 43–77)
PLATELETS: 149 10*3/uL (ref 140–440)
RBC: 5.01 10*6/uL — ABNORMAL HIGH (ref 3.63–4.92)
RDW: 13.4 % (ref 12.3–17.7)
WBC: 3.9 10*3/uL (ref 3.8–11.8)

## 2023-10-01 LAB — MAGNESIUM: MAGNESIUM: 1.9 mg/dL (ref 1.9–2.7)

## 2023-10-01 LAB — THYROID STIMULATING HORMONE WITH FREE T4 REFLEX: TSH: 1.125 u[IU]/mL (ref 0.450–5.330)

## 2023-10-01 MED ORDER — ALBUTEROL SULFATE HFA 90 MCG/ACTUATION AEROSOL INHALER - RN
2.0000 | Freq: Once | RESPIRATORY_TRACT | Status: DC | PRN
Start: 2023-10-01 — End: 2023-10-02

## 2023-10-01 MED ORDER — ALBUTEROL SULFATE 2.5 MG/3 ML (0.083 %) SOLUTION FOR NEBULIZATION
2.5000 mg | INHALATION_SOLUTION | Freq: Once | RESPIRATORY_TRACT | Status: DC | PRN
Start: 2023-10-01 — End: 2023-10-02

## 2023-10-01 MED ORDER — DIPHENHYDRAMINE 50 MG/ML INJECTION SOLUTION
25.0000 mg | Freq: Once | INTRAMUSCULAR | Status: DC | PRN
Start: 2023-10-01 — End: 2023-10-02

## 2023-10-01 MED ORDER — SODIUM CHLORIDE 0.9 % INTRAVENOUS SOLUTION
200.0000 mg | Freq: Once | INTRAVENOUS | Status: AC
Start: 2023-10-01 — End: 2023-10-01
  Administered 2023-10-01: 200 mg via INTRAVENOUS
  Administered 2023-10-01: 0 mg via INTRAVENOUS
  Filled 2023-10-01: qty 8

## 2023-10-01 MED ORDER — ONDANSETRON HCL (PF) 4 MG/2 ML INJECTION SOLUTION
8.0000 mg | Freq: Once | INTRAMUSCULAR | Status: DC | PRN
Start: 2023-10-01 — End: 2023-10-02

## 2023-10-01 MED ORDER — FAMOTIDINE (PF) 20 MG/2 ML INTRAVENOUS SOLUTION
20.0000 mg | Freq: Once | INTRAVENOUS | Status: DC | PRN
Start: 2023-10-01 — End: 2023-10-02

## 2023-10-01 MED ORDER — ONDANSETRON HCL 8 MG TABLET
8.0000 mg | ORAL_TABLET | Freq: Once | ORAL | Status: DC | PRN
Start: 2023-10-01 — End: 2023-10-02

## 2023-10-01 MED ORDER — SODIUM CHLORIDE 0.9% FLUSH BAG - 250 ML
INTRAVENOUS | Status: DC | PRN
Start: 2023-10-01 — End: 2023-10-02

## 2023-10-01 MED ORDER — EPINEPHRINE 1 MG/ML (1 ML) INJECTION SOLUTION
0.3000 mg | Freq: Once | INTRAMUSCULAR | Status: DC | PRN
Start: 2023-10-01 — End: 2023-10-02

## 2023-10-01 MED ORDER — DEXTROSE 5% IN WATER (D5W) FLUSH BAG - 250 ML
INTRAVENOUS | Status: DC | PRN
Start: 2023-10-01 — End: 2023-10-02

## 2023-10-01 MED ORDER — MEPERIDINE (PF) 25 MG/ML INJECTION SOLUTION
12.5000 mg | Freq: Once | INTRAMUSCULAR | Status: DC | PRN
Start: 2023-10-01 — End: 2023-10-02

## 2023-10-01 MED ORDER — HYDROCORTISONE SOD SUCCINATE 100 MG/2 ML VIAL WRAPPER
100.0000 mg | Freq: Once | INTRAMUSCULAR | Status: DC | PRN
Start: 2023-10-01 — End: 2023-10-02

## 2023-10-01 MED ORDER — DIPHENHYDRAMINE 50 MG/ML INJECTION SOLUTION
50.0000 mg | Freq: Once | INTRAMUSCULAR | Status: DC | PRN
Start: 2023-10-01 — End: 2023-10-02

## 2023-10-01 NOTE — Nurses Notes (Signed)
 [phone]: Ambulatory to outpatient oncology. Here today for Keytruda  infusion. Peripheral IV obtained to right hand without difficulty. Assessment WDL. Denies all complaints today. Aureliano Leep, RN  Patient Assessment/Symptom Management Patient Has No MD Appointment Today   Key: (+) Symptom present           (-)  Symptom not present If Symptom is Positive(+) a Nursing Note is required   Edema -   Uncontrolled Nausea -   Vomiting -   Inability to eat/drink -   Mouth Sores -   Diarrhea -   Constipation (? Last BM) -   Fatigue that interferes with ADL's -   Numbness/Tingling -change -   Other -   Fever/Signs & Symptoms of infection -   Nurse Initials JT     1114: Keytruda  infusion started at this time. Aureliano Leep, RN  [phone]: Keytruda  infusion complete and line flushing. No signs or symptoms of reaction noted. Aureliano Leep, RN  1200: Flush complete. Peripheral IV removed and pressure dressing applied. No bleeding noted at removal site. No voiced complaints. Aureliano Leep, RN  [phone]: Checking out at this time. No changes noted. Aureliano Leep, RN

## 2023-10-05 ENCOUNTER — Encounter (INDEPENDENT_AMBULATORY_CARE_PROVIDER_SITE_OTHER): Payer: Self-pay | Admitting: HEMATOLOGY-ONCOLOGY

## 2023-10-05 ENCOUNTER — Other Ambulatory Visit: Payer: Self-pay

## 2023-10-05 ENCOUNTER — Ambulatory Visit
Admission: RE | Admit: 2023-10-05 | Discharge: 2023-10-05 | Disposition: A | Discharge status: Home / Self Care | Payer: Self-pay | Source: Ambulatory Visit

## 2023-10-05 DIAGNOSIS — C642 Malignant neoplasm of left kidney, except renal pelvis: Secondary | ICD-10-CM | POA: Insufficient documentation

## 2023-10-06 ENCOUNTER — Encounter (INDEPENDENT_AMBULATORY_CARE_PROVIDER_SITE_OTHER): Payer: Self-pay | Admitting: HEMATOLOGY-ONCOLOGY

## 2023-10-07 ENCOUNTER — Other Ambulatory Visit (INDEPENDENT_AMBULATORY_CARE_PROVIDER_SITE_OTHER): Payer: Self-pay | Admitting: NURSE PRACTITIONER

## 2023-10-07 ENCOUNTER — Encounter (INDEPENDENT_AMBULATORY_CARE_PROVIDER_SITE_OTHER): Payer: Self-pay | Admitting: HEMATOLOGY-ONCOLOGY

## 2023-10-09 ENCOUNTER — Other Ambulatory Visit (INDEPENDENT_AMBULATORY_CARE_PROVIDER_SITE_OTHER): Payer: Self-pay | Admitting: NURSE PRACTITIONER

## 2023-10-12 ENCOUNTER — Encounter (INDEPENDENT_AMBULATORY_CARE_PROVIDER_SITE_OTHER): Payer: Self-pay | Admitting: HEMATOLOGY-ONCOLOGY

## 2023-10-20 ENCOUNTER — Other Ambulatory Visit (INDEPENDENT_AMBULATORY_CARE_PROVIDER_SITE_OTHER): Payer: Self-pay | Admitting: HEMATOLOGY-ONCOLOGY

## 2023-10-21 ENCOUNTER — Encounter (INDEPENDENT_AMBULATORY_CARE_PROVIDER_SITE_OTHER): Payer: Self-pay | Admitting: HEMATOLOGY-ONCOLOGY

## 2023-10-21 ENCOUNTER — Other Ambulatory Visit: Payer: Self-pay

## 2023-10-21 NOTE — Cancer Center Note (Signed)
 Department of Hematology/Oncology  Return Patient Visit    Name: Taylor Smith  FMW:[id number]  Date of Birth: December 26, 1970  Encounter Date: 10/22/2023    REFERRING PROVIDER:  No referring provider defined for this encounter.      REASON FOR OFFICE VISIT:  management of stage IV clear cell renal carcinoma    HISTORY OF PRESENT ILLNESS:A  Taylor Smith is a 53 y.o. 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       04/16/2023: The patient is here for follow up of metastatic renal cell cancer.  She is doing well and denies any new problems at this time other than some discomfort in her right hand because she states that she has always receiving her infusions in that same location.      05/06/22:  Patient is here for follow up of metastatic renal cell cancer.  She is doing well but has noticed increase in shortness of breath.  She states that she has had blood drawn off before and that helped.   She denies any increased cough.  She had recent CT scan at [facility] and no changes or issues with lungs were noted.     07/30/23: Patient is here for follow up metastatic renal cell cancer.   She has been taking care of her husband that had brain surgery.  She has noticed pain in her hand joints and her knees.  She had an injection in her left knee and was told that she had spurs in the knee.               Oncology History   Kidney cancer, primary, with metastasis from kidney to other site, left (CMS [facility])   01/06/2022 Biopsy/Results    Vaginal polyp, biopsy: Clear cell carcinoma, present at biopsy margin     01/13/2022 Initial Diagnosis    Kidney cancer, primary, with metastasis from kidney to other site, left (CMS HCC)     01/13/2022 Cancer Staged     Stage IV         01/17/2022 - 04/11/2022 Chemotherapy Regimen    Plan: IPILIMUMAB  (1 MG/KG) + NIVOLUMAB  (3 MG/KG) EVERY 3 WEEK Start Date: 01/17/2022         10/09/2022 Biopsy/Results         11/19/2022 -  Chemotherapy Regimen    Plan: PEMBROLIZUMAB  EVERY 3 WEEKS  Start Date: 11/19/2022         03/09/2023 Imaging    CT CAP  NO NEW PULMONARY OR PLEURAL FINDINGS. SIGNIFICANT SUCCESSFUL RESPONSE OF THE TREATMENT FOR PULMONARY NODULES     NO NEW FINDING AT THE LEFT RENAL FOSSA AFTER NEPHRECTOMY     SMALLER LEFT INGUINAL NODES COMPARED TO THE OLD EXAM FROM DECEMBER 2023 WITH NO DEFINITE NEW ADENOPATHY     INDETERMINATE APPEARANCE OF THE GASTRIC FUNDUS PROBABLY DUE TO INCOMPLETE DISTENTION. CORRELATE WITH ENDOSCOPY IF NOT RECENTLY DONE     INCIDENTAL FINDINGS AS DETAILED ABOVE      10/05/2023 Imaging    NO EVIDENCE OF DISEASE PROGRESSION WITHIN THE CHEST, ABDOMEN OR PELVIS. OTHER FINDINGS AS ABOVE.            ROS:   Review of Systems   Constitutional:  Negative for appetite change, chills and fatigue.   HENT:   Negative for sore throat and trouble swallowing.    Eyes:  Negative for eye problems.   Respiratory:  Negative for cough and shortness of breath.  Cardiovascular:  Negative for chest pain and leg swelling.   Gastrointestinal:  Negative for abdominal pain.   Genitourinary:  Negative for dysuria and hematuria.    Musculoskeletal:  Negative for arthralgias and gait problem.   Skin:  Negative for rash.   Neurological:  Negative for gait problem.   Hematological:  Negative for adenopathy.   Psychiatric/Behavioral:  Negative for depression.         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)     Kidney cancer, primary, with metastasis from kidney to other site, left (CMS [facility])          Past Surgical History:   Procedure Laterality Date    HX CESAREAN SECTION      HX CHOLECYSTECTOMY      HX CYST REMOVAL Right     HX HYSTERECTOMY      NEPHROURETERECTOMY Left     TOTAL THYROIDECTOMY           Social History     Socioeconomic History    Marital status: Married     Spouse name: Not on file    Number of children: Not on  file    Years of education: Not on file    Highest education level: Not on file   Occupational History    Not on file   Tobacco Use    Smoking status: Never    Smokeless tobacco: Never   Vaping Use    Vaping status: Never Used   Substance and Sexual Activity    Alcohol  use: Never    Drug use: Never    Sexual activity: Yes     Partners: Male   Other Topics Concern    Not on file   Social History Narrative    Not on file     Social Determinants of Health     Financial Resource Strain: Not on file   Transportation Needs: No Transportation Needs (10/10/2022)    Received from [facility]    PRAPARE - Transportation     In the past 12 months, has lack of transportation kept you from medical appointments or from getting medications?: No     Lack of Transportation (Non-Medical): No   Social Connections: Not on file   Intimate Partner Violence: High Risk (01/13/2022)    Intimate Partner Violence     SDOH Domestic Violence: No   Housing Stability: Not on file     Family Medical History:       Problem Relation (Age of Onset)    Cancer Maternal Aunt, Maternal Grandmother    Colon Cancer Maternal Uncle    Lung Cancer Mother    Melanoma Maternal Aunt            Current Outpatient Medications   Medication Sig    ergocalciferol , vitamin D2, (DRISDOL ) 1,250 mcg (50,000 unit) Oral Capsule TAKE 1 CAPSULE BY MOUTH ONE TIME PER WEEK    gabapentin  (NEURONTIN ) 300 mg Oral Capsule Take 1 Capsule (300 mg total) by mouth Once a day    Ibuprofen  (MOTRIN ) 600 mg Oral Tablet Take 1 Tablet (600 mg total) by mouth Every 6 hours as needed    levothyroxine  (SYNTHROID) 150 mcg Oral Tablet TAKE 1 TABLET BY MOUTH EVERY MORNING.    omeprazole  (PRILOSEC) 40 mg Oral Capsule, Delayed  Release(E.C.) Take 1 Capsule (40 mg total) by mouth Daily    ondansetron  (ZOFRAN  ODT) 4 mg Oral Tablet, Rapid Dissolve Take 1 Tablet (4 mg total) by mouth Every 8 hours as needed for Nausea/Vomiting    pembrolizumab  (KEYTRUDA ) 25 mg/mL Intravenous Solution  Infuse 8 mL (200 mg total) into a venous catheter Every 21 days    valsartan  (DIOVAN ) 40 mg Oral Tablet TAKE 2 TABLETS (80 MG TOTAL) BY MOUTH TWICE DAILY     Allergies   Allergen Reactions    Aldactone [Spironolactone] Itching       PHYSICAL EXAM:  There were no vitals taken for this visit.       ECOG Status: (0) Fully active, able to carry on all predisease performance without restriction   Physical Exam  Constitutional:       General: She is not in acute distress.     Appearance: Normal appearance.   Eyes:      Extraocular Movements: Extraocular movements intact.   Cardiovascular:      Rate and Rhythm: Normal rate and regular rhythm.   Pulmonary:      Effort: Pulmonary effort is normal.   Abdominal:      General: Abdomen is flat.      Palpations: Abdomen is soft.   Musculoskeletal:         General: Normal range of motion.      Cervical back: Normal range of motion.   Skin:     General: Skin is warm and dry.   Neurological:      General: No focal deficit present.      Mental Status: She is alert.   Psychiatric:         Mood and Affect: Mood normal.         DIAGNOSTIC DATA:  CT RCC protocol incl chest w MIPS and dual abd pel w wo  Order: [id number]  Narrative    Procedure: CT Chest with IV Contrast  Procedure: CT Abdomen without and with IV Contrast  Procedure: CT Pelvis with IV Contrast    Comparison:  12/02/2022    Indication:  Kidney cancer, staging, C78.00 Secondary malignant neoplasm of  unspecified lung (CMS/HHS-HCC), C64.9 Malignant neoplasm of unspecified  kidney, except renal pelvis (CMS/HHS-HCC).    Technique:  CT imaging of the chest, abdomen, and pelvis was performed  following the administration of IV contrast. Imaging was performed through  the kidneys before the administration of contrast, followed by arterial  phase imaging through the liver, and portal venous phase imaging of the  chest, abdomen, and pelvis.   Iodinated contrast was used due to the  indications for the examination, to improve disease  detection and to  further define anatomy.   Coronal and sagittal reformatted images of the  abdomen and pelvis were generated and reviewed. 3-D maximum intensity  projection (MIP) reconstructions of the chest were performed to potentially  increase study sensitivity.    Findings:  Chest:    - Chest wall and Thoracic Inlet: No masses or lymphadenopathy.  Thyroidectomy.    - Mediastinum and Hila: No masses or lymphadenopathy.    - Thoracic Vessels: Normal caliber of the thoracic aorta and main pulmonary  artery.    - Heart and Pericardium: Normal heart size.  No pericardial effusion.    - Lungs and Airways: A 0.5 cm right lower lobe pulmonary nodule is  unchanged (series 9, image 59). Other tiny pulmonary nodules are also  unchanged.    - Pleura:  No pleural effusions.      Abdomen and pelvis:    - Liver: Normal in morphology and enhancement.  No suspicious hepatic  masses are identified.  The portal and hepatic veins are patent.    - Biliary and Gallbladder: Cholecystectomy. Mild central intrahepatic  biliary ductal dilatation, similar to prior.    - Spleen: Normal in appearance.      - Pancreas: Normal in appearance.    - Adrenal Glands: Normal right adrenal gland.    - Kidneys: Left nephrectomy. No suspicious renal lesions. No  hydronephrosis.    - Abdominal and Pelvic Vasculature: No abdominal aortic aneurysm.    - Gastrointestinal Tract: No abnormal dilation or wall thickening.    - Peritoneum/Mesentery/Retroperitoneum: No free fluid.  No free  intraperitoneal air.    - Lymph Nodes: No retroperitoneal or mesenteric lymphadenopathy.      - Bladder: Normal in appearance.    - Pelvic Organs: Unremarkable.    - Body Wall: Unremarkable.    - Musculoskeletal:  No aggressive appearing osseous lesions. Previously  seen osseous lesions in T1 and T3 are unchanged.      Impression:  No new or enlarging metastatic disease within the chest, abdomen, or  pelvis.  Tiny pulmonary nodules, unchanged.        LABS:   CBC  Diff   Lab  Results   Component Value Date/Time    WBC 3.9 10/01/2023 10:02 AM    HGB 15.2 (H) 10/01/2023 10:02 AM    HCT 43.9 (H) 10/01/2023 10:02 AM    PLTCNT 149 10/01/2023 10:02 AM    RBC 5.01 (H) 10/01/2023 10:02 AM    MCV 87.7 10/01/2023 10:02 AM    MCHC 34.7 10/01/2023 10:02 AM    MCH 30.4 10/01/2023 10:02 AM    RDW 13.4 10/01/2023 10:02 AM    MPV 10.2 10/01/2023 10:02 AM    Lab Results   Component Value Date/Time    PMNS 60 10/01/2023 10:02 AM    LYMPHOCYTES 28 10/01/2023 10:02 AM    EOSINOPHIL 4 10/01/2023 10:02 AM    MONOCYTES 8 10/01/2023 10:02 AM    BASOPHILS 1 10/01/2023 10:02 AM    BASOPHILS 0.00 10/01/2023 10:02 AM    PMNABS 2.30 10/01/2023 10:02 AM    LYMPHSABS 1.10 10/01/2023 10:02 AM    EOSABS 0.10 10/01/2023 10:02 AM    MONOSABS 0.30 10/01/2023 10:02 AM            Comprehensive Metabolic Profile    Lab Results   Component Value Date    SODIUM 141 10/01/2023    POTASSIUM 3.8 10/01/2023    CHLORIDE 108 (H) 10/01/2023    CO2 25 10/01/2023    ANIONGAP 8 10/01/2023    BUN 12 10/01/2023    CREATININE 1.13 10/01/2023    ALBUMIN 4.6 10/01/2023    CALCIUM 9.5 10/01/2023    GLUCOSENF 93 10/01/2023    ALKPHOS 67 10/01/2023    ALT 31 10/01/2023    AST 26 10/01/2023    TOTBILIRUBIN 0.9 10/01/2023    TOTALPROTEIN 7.4 10/01/2023         ESTIMATED GFR   Date Value Ref Range Status   10/01/2023 58 (L) >59 mL/min/1.14m^2 Final             ASSESSMENT:  No diagnosis found.             PLAN:   1. All relevant medical records were reviewed including available pertinent provider notes, procedure notes, imaging,  laboratory, and pathology.   2. All pertinent labs and/or imaging were reviewed with the patient.   3. Stage IV renal carcinoma:  She has had a great response to treatment, but has known pulmonary metastases.  She is tolerating treatment well and her scans have looked excellent.  We will continue with treatment unchanged.  We discussed that two years of therapy is considered reasonable in the setting of metastatic disease.   She will have treatment today.   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  5. Pain:  Improved.    6.Shortness of breath:  Phlebotomy was ordered but not completed.            Taylor Smith was given the chance to ask questions, and these were answered to their satisfaction. The patient is welcome to call with any questions or concerns in the meantime.     On the day of the encounter, a total of 25 minutes was spent on this patient encounter including review of historical information, examination, documentation and post-visit activities.   No follow-ups on file.     Randine Clara APRN, FNP-BC, AOCNP, 10/21/2023 , 08:58   You can see your note(s) in [facility]. It is common for you to encounter certain medical terminology which may be unfamiliar to you. You might see results before your provider does so please give at least 2 business days for review. Please have this understanding, that NOT all abnormal results are significant. Our office will contact you for any urgent or emergent action if necessary. If you have any questions or concerns, feel free to send a MyChart message or call the office. Please call with any new or concerning symptoms.     The patient's insurance company bears full legal and financial responsibility resulting from any deviations they cause to my recommended treatment plan.  CC:  Rosina Linsey, FNP-C  [address]  [place] [address]    No referring provider defined for this encounter.    This note was partially generated using MModal Fluency Direct system, and there may be some incorrect words, spellings, and punctuation that were not noted in checking the note before saving.

## 2023-10-22 ENCOUNTER — Encounter (INDEPENDENT_AMBULATORY_CARE_PROVIDER_SITE_OTHER): Payer: Self-pay | Admitting: NURSE PRACTITIONER

## 2023-10-22 ENCOUNTER — Ambulatory Visit: Payer: Self-pay | Attending: NURSE PRACTITIONER | Admitting: NURSE PRACTITIONER

## 2023-10-22 ENCOUNTER — Ambulatory Visit (INDEPENDENT_AMBULATORY_CARE_PROVIDER_SITE_OTHER)
Admission: RE | Admit: 2023-10-22 | Discharge: 2023-10-22 | Disposition: A | Discharge status: Home / Self Care | Payer: Self-pay | Source: Ambulatory Visit | Attending: NURSE PRACTITIONER | Admitting: NURSE PRACTITIONER

## 2023-10-22 ENCOUNTER — Ambulatory Visit (HOSPITAL_COMMUNITY)

## 2023-10-22 VITALS — BP 142/87 | HR 74 | Temp 97.8°F | Ht 69.0 in | Wt 229.9 lb

## 2023-10-22 DIAGNOSIS — Z9222 Personal history of monoclonal drug therapy: Secondary | ICD-10-CM | POA: Insufficient documentation

## 2023-10-22 DIAGNOSIS — Z7901 Long term (current) use of anticoagulants: Secondary | ICD-10-CM | POA: Insufficient documentation

## 2023-10-22 DIAGNOSIS — Z86718 Personal history of other venous thrombosis and embolism: Secondary | ICD-10-CM | POA: Insufficient documentation

## 2023-10-22 DIAGNOSIS — C7989 Secondary malignant neoplasm of other specified sites: Secondary | ICD-10-CM | POA: Insufficient documentation

## 2023-10-22 DIAGNOSIS — Z9889 Other specified postprocedural states: Secondary | ICD-10-CM | POA: Insufficient documentation

## 2023-10-22 DIAGNOSIS — C642 Malignant neoplasm of left kidney, except renal pelvis: Secondary | ICD-10-CM

## 2023-10-22 DIAGNOSIS — C7800 Secondary malignant neoplasm of unspecified lung: Secondary | ICD-10-CM | POA: Insufficient documentation

## 2023-10-22 LAB — CBC WITH DIFF
BASOPHIL #: 0 10*3/uL (ref 0.00–0.10)
BASOPHIL %: 1 % (ref 0–1)
EOSINOPHIL #: 0.1 10*3/uL (ref 0.00–0.50)
EOSINOPHIL %: 4 % (ref 1–7)
HCT: 42.9 % — ABNORMAL HIGH (ref 31.2–41.9)
HGB: 15 g/dL — ABNORMAL HIGH (ref 10.9–14.3)
LYMPHOCYTE #: 1 10*3/uL — ABNORMAL LOW (ref 1.10–3.10)
LYMPHOCYTE %: 29 % (ref 16–46)
MCH: 30.7 pg (ref 24.7–32.8)
MCHC: 35 g/dL (ref 32.3–35.6)
MCV: 87.5 fL (ref 75.5–95.3)
MONOCYTE #: 0.3 10*3/uL (ref 0.20–0.90)
MONOCYTE %: 8 % (ref 4–11)
MPV: 10.5 fL (ref 7.9–10.8)
NEUTROPHIL #: 2.2 10*3/uL (ref 1.90–8.20)
NEUTROPHIL %: 59 % (ref 43–77)
PLATELETS: 132 10*3/uL — ABNORMAL LOW (ref 140–440)
RBC: 4.9 10*6/uL (ref 3.63–4.92)
RDW: 13.3 % (ref 12.3–17.7)
WBC: 3.7 10*3/uL — ABNORMAL LOW (ref 3.8–11.8)

## 2023-10-22 LAB — COMPREHENSIVE METABOLIC PANEL, NON-FASTING
ALBUMIN/GLOBULIN RATIO: 1.6 — ABNORMAL HIGH (ref 0.8–1.4)
ALBUMIN: 4.7 g/dL (ref 3.5–5.7)
ALKALINE PHOSPHATASE: 69 U/L (ref 34–104)
ALT (SGPT): 35 U/L (ref 7–52)
ANION GAP: 6 mmol/L (ref 4–13)
AST (SGOT): 29 U/L (ref 13–39)
BILIRUBIN TOTAL: 0.8 mg/dL (ref 0.3–1.0)
BUN/CREA RATIO: 14 (ref 6–22)
BUN: 15 mg/dL (ref 7–25)
CALCIUM, CORRECTED: 9.1 mg/dL (ref 8.9–10.8)
CALCIUM: 9.7 mg/dL (ref 8.6–10.3)
CHLORIDE: 107 mmol/L (ref 98–107)
CO2 TOTAL: 29 mmol/L (ref 21–31)
CREATININE: 1.11 mg/dL (ref 0.60–1.30)
ESTIMATED GFR: 59 mL/min/{1.73_m2} — ABNORMAL LOW (ref >59)
GLOBULIN: 3 (ref 2.0–3.5)
GLUCOSE: 97 mg/dL (ref 74–109)
OSMOLALITY, CALCULATED: 284 mosm/kg (ref 270–290)
POTASSIUM: 4 mmol/L (ref 3.5–5.1)
PROTEIN TOTAL: 7.7 g/dL (ref 6.4–8.9)
SODIUM: 142 mmol/L (ref 136–145)

## 2023-10-22 LAB — MAGNESIUM: MAGNESIUM: 1.8 mg/dL — ABNORMAL LOW (ref 1.9–2.7)

## 2023-10-22 LAB — THYROID STIMULATING HORMONE WITH FREE T4 REFLEX: TSH: 1.564 u[IU]/mL (ref 0.450–5.330)

## 2023-11-05 ENCOUNTER — Other Ambulatory Visit (INDEPENDENT_AMBULATORY_CARE_PROVIDER_SITE_OTHER): Payer: Self-pay | Admitting: NURSE PRACTITIONER

## 2023-11-12 ENCOUNTER — Ambulatory Visit

## 2023-11-15 ENCOUNTER — Encounter (INDEPENDENT_AMBULATORY_CARE_PROVIDER_SITE_OTHER): Payer: Self-pay | Admitting: NURSE PRACTITIONER

## 2023-11-16 ENCOUNTER — Other Ambulatory Visit (INDEPENDENT_AMBULATORY_CARE_PROVIDER_SITE_OTHER): Payer: Self-pay | Admitting: NURSE PRACTITIONER

## 2023-11-16 DIAGNOSIS — R519 Headache, unspecified: Secondary | ICD-10-CM

## 2023-11-23 ENCOUNTER — Ambulatory Visit (INDEPENDENT_AMBULATORY_CARE_PROVIDER_SITE_OTHER): Payer: Self-pay | Admitting: NURSE PRACTITIONER

## 2023-11-23 ENCOUNTER — Other Ambulatory Visit: Payer: Self-pay

## 2023-11-23 ENCOUNTER — Ambulatory Visit
Admission: RE | Admit: 2023-11-23 | Discharge: 2023-11-23 | Disposition: A | Discharge status: Home / Self Care | Payer: Self-pay | Source: Ambulatory Visit | Attending: NURSE PRACTITIONER | Admitting: NURSE PRACTITIONER

## 2023-11-23 DIAGNOSIS — J320 Chronic maxillary sinusitis: Secondary | ICD-10-CM

## 2023-11-23 DIAGNOSIS — R519 Headache, unspecified: Secondary | ICD-10-CM | POA: Insufficient documentation

## 2023-11-23 MED ORDER — AZELASTINE 137 MCG (0.1 %) NASAL SPRAY
1.0000 | Freq: Two times a day (BID) | NASAL | 3 refills | Status: DC
Start: 2023-11-23 — End: 2023-12-15

## 2023-12-02 ENCOUNTER — Other Ambulatory Visit (INDEPENDENT_AMBULATORY_CARE_PROVIDER_SITE_OTHER): Payer: Self-pay | Admitting: NURSE PRACTITIONER

## 2023-12-02 DIAGNOSIS — C642 Malignant neoplasm of left kidney, except renal pelvis: Secondary | ICD-10-CM

## 2023-12-03 ENCOUNTER — Ambulatory Visit (INDEPENDENT_AMBULATORY_CARE_PROVIDER_SITE_OTHER): Payer: Self-pay | Admitting: NURSE PRACTITIONER

## 2023-12-03 ENCOUNTER — Ambulatory Visit (INDEPENDENT_AMBULATORY_CARE_PROVIDER_SITE_OTHER)
Admission: RE | Admit: 2023-12-03 | Discharge: 2023-12-03 | Disposition: A | Discharge status: Home / Self Care | Payer: Self-pay | Source: Ambulatory Visit | Attending: NURSE PRACTITIONER | Admitting: NURSE PRACTITIONER

## 2023-12-03 ENCOUNTER — Ambulatory Visit: Payer: Self-pay | Attending: NURSE PRACTITIONER | Admitting: NURSE PRACTITIONER

## 2023-12-03 ENCOUNTER — Encounter (INDEPENDENT_AMBULATORY_CARE_PROVIDER_SITE_OTHER): Payer: Self-pay | Admitting: NURSE PRACTITIONER

## 2023-12-03 ENCOUNTER — Other Ambulatory Visit: Payer: Self-pay

## 2023-12-03 VITALS — BP 152/99 | HR 61 | Temp 97.3°F | Ht 69.0 in | Wt 229.4 lb

## 2023-12-03 DIAGNOSIS — M2550 Pain in unspecified joint: Secondary | ICD-10-CM | POA: Insufficient documentation

## 2023-12-03 DIAGNOSIS — Z08 Encounter for follow-up examination after completed treatment for malignant neoplasm: Secondary | ICD-10-CM | POA: Insufficient documentation

## 2023-12-03 DIAGNOSIS — Z85528 Personal history of other malignant neoplasm of kidney: Secondary | ICD-10-CM | POA: Insufficient documentation

## 2023-12-03 DIAGNOSIS — Z9221 Personal history of antineoplastic chemotherapy: Secondary | ICD-10-CM | POA: Insufficient documentation

## 2023-12-03 DIAGNOSIS — B029 Zoster without complications: Secondary | ICD-10-CM | POA: Insufficient documentation

## 2023-12-03 DIAGNOSIS — C642 Malignant neoplasm of left kidney, except renal pelvis: Secondary | ICD-10-CM

## 2023-12-03 MED ORDER — VALACYCLOVIR 500 MG TABLET
1000.0000 mg | ORAL_TABLET | Freq: Three times a day (TID) | ORAL | 0 refills | Status: AC
Start: 2023-12-03 — End: 2023-12-24

## 2023-12-03 NOTE — Result Encounter Note (Signed)
 Labs stable.  GFR 55 at this time.   Magnesium level normal as well.

## 2023-12-03 NOTE — Cancer Center Note (Signed)
 Taylor Smith  [id number]  [birth year]   12/03/2023       Department of Hematology/Oncology  Return Patient Visit           REFERRING PROVIDER:  Fenton Knee, FNP-C  [address]  [place],  [place] [address]    PCP:  Knee Fenton, FNP-C  [address]  [place] [address]          REASON FOR OFFICE VISIT:  Ongoing management and evaluation of  Renal Cell Carcinoma      HISTORY OF PRESENT ILLNESS:  History of Present Illness  Taylor Smith is a 53 year old female with renal cancer who presents for follow-up after completing Keytruda  treatment.    Renal cancer status post immunotherapy  - Completed one year of Keytruda  treatment for renal cancer, with last session at the end of June 2025    Neuropsychiatric symptoms  - Significant mood changes, including depression and mood swings, began approximately one week after last  visit  - Mood changes lasted for over a week and were unusual for her baseline  - History of postpartum depression 32 years ago  - Typically only experiences similar symptoms when confined indoors during snowy weather or after surgery    Headache  - Severe headaches developed following mood changes, with sudden onset and duration of over a week  - CT scan showed no significant findings  - Headaches have since improved    Dermatologic symptoms  - Rash developed on right side, present for about one week  - Rash is pruritic and has burning sensation at times  - Uses Benadryl  cream for symptom management  - Attempts to keep pants below the rash to avoid irritation  - Questions if the rash could be shingles, as her mother had a history of shingles    Arthralgia and swelling  - Persistent joint pain, particularly severe in knees and hands  - Joint pain worsens at times and causes difficulty with movement, especially in mornings and evenings  - Swelling present in hands  - Alternates between Tylenol  and Motrin  for pain management, cautious about overuse  - Inquires about Tylenol  product containing turmeric for  joint pain relief          REVIEW OF SYSTEMS:    Past Medical History:   Diagnosis Date    Family history of colon cancer requiring screening colonoscopy     HTN (hypertension)     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)     Kidney cancer, primary, with metastasis from kidney to other site, left (CMS [facility])            Past Surgical History:   Procedure Laterality Date    HX CESAREAN SECTION      HX CHOLECYSTECTOMY      HX CYST REMOVAL Right     HX HYSTERECTOMY      NEPHROURETERECTOMY Left     TOTAL THYROIDECTOMY             Social History     Socioeconomic History    Marital status: Married     Spouse name: Not on file    Number of children: Not on file    Years of education: Not on file    Highest education level: Not on file   Occupational History    Not on file   Tobacco Use    Smoking status: Never    Smokeless tobacco: Never  Vaping Use    Vaping status: Never Used   Substance and Sexual Activity    Alcohol  use: Never    Drug use: Never    Sexual activity: Yes     Partners: Male   Other Topics Concern    Not on file   Social History Narrative    Not on file     Social Determinants of Health     Financial Resource Strain: Not on file   Transportation Needs: No Transportation Needs (10/10/2022)    Received from [facility]    PRAPARE - Transportation     In the past 12 months, has lack of transportation kept you from medical appointments or from getting medications?: No     Lack of Transportation (Non-Medical): No   Social Connections: Not on file   Intimate Partner Violence: High Risk (01/13/2022)    Intimate Partner Violence     SDOH Domestic Violence: No   Housing Stability: Not on file       Social History     Social History Narrative    Not on file       Social History     Substance and Sexual Activity   Drug Use Never       Family Medical History:       Problem Relation (Age of Onset)    Cancer Maternal Aunt, Maternal Grandmother    Colon Cancer Maternal Uncle     Lung Cancer Mother    Melanoma Maternal Aunt              Current Outpatient Medications   Medication Sig    azelastine  (ASTELIN ) 137 mcg (0.1 %) Nasal Spray, Non-Aerosol Administer 1 Spray into each nostril Twice daily Use in each nostril as directed Indications: non-seasonal allergic stuffy and runny nose    ergocalciferol , vitamin D2, (DRISDOL ) 1,250 mcg (50,000 unit) Oral Capsule TAKE 1 CAPSULE BY MOUTH ONE TIME PER WEEK    gabapentin  (NEURONTIN ) 300 mg Oral Capsule Take 1 Capsule (300 mg total) by mouth Once a day    Ibuprofen  (MOTRIN ) 600 mg Oral Tablet Take 1 Tablet (600 mg total) by mouth Every 6 hours as needed    levothyroxine  (SYNTHROID) 150 mcg Oral Tablet TAKE 1 TABLET BY MOUTH EVERY MORNING.    omeprazole  (PRILOSEC) 40 mg Oral Capsule, Delayed Release(E.C.) Take 1 Capsule (40 mg total) by mouth Daily    ondansetron  (ZOFRAN  ODT) 4 mg Oral Tablet, Rapid Dissolve Take 1 Tablet (4 mg total) by mouth Every 8 hours as needed for Nausea/Vomiting    pembrolizumab  (KEYTRUDA ) 25 mg/mL Intravenous Solution Infuse 8 mL (200 mg total) into a venous catheter Every 21 days    valACYclovir  (VALTREX ) 500 mg Oral Tablet Take 2 Tablets (1,000 mg total) by mouth Three times a day for 10 days Indications: shingles    valsartan  (DIOVAN ) 40 mg Oral Tablet TAKE 2 TABLETS (80 MG TOTAL) BY MOUTH TWICE DAILY       Allergies[1]      PHYSICAL EXAM:  BP (!) 152/99 (Site: Right Arm, Patient Position: Sitting, Cuff Size: Adult)   Pulse 61   Temp 36.3 C (97.3 F) (Temporal)   Ht 1.753 m (5' 9)   Wt 104 kg (229 lb 6.4 oz)   SpO2 94%   BMI 33.88 kg/m        ECOG Status: (0) Fully active, able to carry on all predisease performance without restriction   Physical Exam  NECK: No cervical  lymphadenopathy. Thyroid  size normal.  CHEST: Lungs clear to auscultation bilaterally.  CARDIOVASCULAR: Radial pulses equal bilaterally. Heart regular, no murmur.  BREAST: No axillary lymphadenopathy.  Skin:  Patchy red rash to right lateral side  with vesicles noted.       LABS:   Results  LABS  WBC: 4.3 (12/03/2023)  Hb: 14.7 (12/03/2023)  Hct: 42.5 (12/03/2023)  PLT: 138 (12/03/2023)    RADIOLOGY  CT scan: Normal       ASSESSMENT:    ICD-10-CM    1. Kidney cancer, primary, with metastasis from kidney to other site, left (CMS HCC)  C64.2 THYROID  STIMULATING HORMONE WITH FREE T4 REFLEX     valACYclovir  (VALTREX ) 500 mg Oral Tablet     CT CHEST ABDOMEN PELVIS W IV CONTRAST      2. Herpes zoster  B02.9            Assessment & Plan  Renal cell carcinoma, status post immunotherapy  Completed one year of Keytruda  with the last treatment in late June. Experiencing post-treatment symptoms including headaches, depressive symptoms, and a rash. Headaches and depressive symptoms have resolved. The rash is suspected to be shingles, possibly triggered by stress and immunocompromised state post-treatment. Keytruda  side effects can persist up to six months post-treatment.  - Order CT scan next month to monitor for recurrence.    Herpes zoster (shingles), right side  Rash on the right side, itching and burning, suspected to be shingles. Symptoms have been present for about a week. Mild case likely due to immunocompromised state post-cancer treatment. Treatment initiated to prevent worsening.  - Prescribe Valtrex  1000 mg three times daily for ten days.    Depressive symptoms following cancer treatment  Experienced depressive symptoms post-Keytruda  treatment, lasting about a week. Symptoms have improved. Likely related to stress and transition off treatment.    Arthralgia (joint pain)  Persistent joint pain, particularly in knees and hands. Pain is worse in the morning and evening. Currently managed with alternating Tylenol  and Motrin . Considering alternative treatments to reduce NSAID use.  - Recommend trial of turmeric supplement for joint pain.  - Suggest use of topical NSAID, Voltaren gel, as an alternative to oral NSAIDs.      Taylor Smith was given the chance to ask  questions, and these were answered to their satisfaction. The patient is welcome to call with any questions or concerns in the meantime.     This note was created using [employer] M*Modal Fluency Align mobile application via conversational audio. Consent for audio recording was obtained by patient/family members prior to recording.      Randine Clara APRN, FNP-BC, AOCNP, 12/03/2023 , 09:29       This note was partially generated using MModal Fluency Direct system, and there may be some incorrect words, spellings, and punctuation that were not noted in checking the note before saving.   You can see your note(s) in [facility]. It is common for you to encounter certain medical terminology which may be unfamiliar to you. You might see results before your provider does so please give at least 2 business days for review. Please have this understanding, that NOT all abnormal results are significant. Our office will contact you for any urgent or emergent action if necessary. If you have any questions or concerns, feel free to send a MyChart message or call the office. Please call with any new or concerning symptoms.     CC:  Rosina Linsey, FNP-C  [address]  [place] [address]                           [1]   Allergies  Allergen Reactions    Aldactone [Spironolactone] Itching

## 2023-12-07 ENCOUNTER — Other Ambulatory Visit: Payer: Self-pay

## 2023-12-08 ENCOUNTER — Ambulatory Visit (INDEPENDENT_AMBULATORY_CARE_PROVIDER_SITE_OTHER): Payer: Self-pay | Admitting: NURSE PRACTITIONER

## 2023-12-08 ENCOUNTER — Encounter (INDEPENDENT_AMBULATORY_CARE_PROVIDER_SITE_OTHER): Payer: Self-pay | Admitting: NURSE PRACTITIONER

## 2023-12-14 LAB — SIGNATERA ONLY
SIGNATERA MTM READOUT: 0 MTM/ml
SIGNATERA TEST RESULT: NEGATIVE

## 2023-12-15 ENCOUNTER — Other Ambulatory Visit (INDEPENDENT_AMBULATORY_CARE_PROVIDER_SITE_OTHER): Payer: Self-pay | Admitting: NURSE PRACTITIONER

## 2023-12-15 DIAGNOSIS — J320 Chronic maxillary sinusitis: Secondary | ICD-10-CM

## 2023-12-15 MED ORDER — AZELASTINE 137 MCG (0.1 %) NASAL SPRAY
1.0000 | Freq: Two times a day (BID) | NASAL | 1 refills | Status: AC
Start: 2023-12-15 — End: ?

## 2023-12-23 ENCOUNTER — Other Ambulatory Visit: Payer: Self-pay

## 2023-12-24 ENCOUNTER — Ambulatory Visit: Admitting: NURSE PRACTITIONER

## 2023-12-24 ENCOUNTER — Other Ambulatory Visit: Payer: Self-pay

## 2023-12-24 ENCOUNTER — Encounter (INDEPENDENT_AMBULATORY_CARE_PROVIDER_SITE_OTHER): Payer: Self-pay | Admitting: NURSE PRACTITIONER

## 2023-12-24 VITALS — Ht 69.0 in

## 2023-12-24 DIAGNOSIS — Z23 Encounter for immunization: Secondary | ICD-10-CM | POA: Insufficient documentation

## 2023-12-24 DIAGNOSIS — H6123 Impacted cerumen, bilateral: Secondary | ICD-10-CM

## 2023-12-24 NOTE — Procedures (Signed)
 [facility]  [address]  [place] [address]  Operated by [facility]  Procedure Note    Name: Taylor Smith MRN:  [id number]   Date: 12/24/2023 DOB:  10/14/1970 (53 y.o.)         [address]210 - REMOVAL IMPACTED CERUMEN W/ INSTRUMENT, UNILATERAL (AMB ONLY-PD)    Performed by: Ira Setter, FNP-BC  Authorized by: Ira Setter, FNP-BC    Time Out:     Immediately before the procedure, a time out was called:  Yes    Patient verified:  Yes    Procedure Verified:  Yes    Site Verified:  Yes  Documentation:      Procedure: Cerumen cleaning   Pre-op Dx: Cerumen impaction   Bilateral EACs examined under binocular microscopy. Cerumen and/or debris was cleaned from the canals using curettes, suction, and alligator forceps. Patient tolerated procedure well.            Setter Ira, FNP-BC

## 2023-12-24 NOTE — Progress Notes (Signed)
 [facility]  [address]  [place] [address]  Phone: [phone]  Fax: [phone]      Encounter Date: 12/24/2023    Patient ID: Taylor Smith  MRN: [id number]    DOB: [birth year]  Age: 53 y.o. female     Progress Note       Referring Provider:  Fenton Knee, FNP-C    Reason for Visit:   Chief Complaint   Patient presents with    Follow Up 6 Months     Recheck   LOV 06/11/2023 Bilateral impacted cerumen   No complaints         History of Present Illness:  Taylor Smith is a 53 y.o. female cerumen check      Patient History:  Problem List[1]  Current Outpatient Medications   Medication Sig    azelastine  (ASTELIN ) 137 mcg (0.1 %) Nasal Spray, Non-Aerosol Administer 1 Spray into each nostril Twice daily Use in each nostril as directed Indications: non-seasonal allergic stuffy and runny nose    ergocalciferol , vitamin D2, (DRISDOL ) 1,250 mcg (50,000 unit) Oral Capsule TAKE 1 CAPSULE BY MOUTH ONE TIME PER WEEK    gabapentin  (NEURONTIN ) 300 mg Oral Capsule Take 1 Capsule (300 mg total) by mouth Once a day    Ibuprofen  (MOTRIN ) 600 mg Oral Tablet Take 1 Tablet (600 mg total) by mouth Every 6 hours as needed    levothyroxine  (SYNTHROID) 150 mcg Oral Tablet TAKE 1 TABLET BY MOUTH EVERY MORNING.    omeprazole  (PRILOSEC) 40 mg Oral Capsule, Delayed Release(E.C.) Take 1 Capsule (40 mg total) by mouth Daily    ondansetron  (ZOFRAN  ODT) 4 mg Oral Tablet, Rapid Dissolve Take 1 Tablet (4 mg total) by mouth Every 8 hours as needed for Nausea/Vomiting    pembrolizumab  (KEYTRUDA ) 25 mg/mL Intravenous Solution Infuse 8 mL (200 mg total) into a venous catheter Every 21 days    valACYclovir  (VALTREX ) 500 mg Oral Tablet Take 2 Tablets (1,000 mg total) by mouth Three times a day for 10 days Indications: shingles    valsartan  (DIOVAN ) 40 mg Oral Tablet TAKE 2 TABLETS (80 MG TOTAL) BY MOUTH TWICE DAILY      Allergies[2]  Past Medical History:   Diagnosis Date    Family history of colon cancer requiring screening colonoscopy      HTN (hypertension)     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)     Kidney cancer, primary, with metastasis from kidney to other site, left (CMS [facility])      Past Surgical History:   Procedure Laterality Date    HX CESAREAN SECTION      HX CHOLECYSTECTOMY      HX CYST REMOVAL Right     HX HYSTERECTOMY      NEPHROURETERECTOMY Left     TOTAL THYROIDECTOMY       Family Medical History:       Problem Relation (Age of Onset)    Cancer Maternal Aunt, Maternal Grandmother    Colon Cancer Maternal Uncle    Lung Cancer Mother    Melanoma Maternal Aunt            Social History[3]    Review of Systems     Vitals:    12/24/23 0946   Height: 1.753 m (5' 9)      ENT Physical Exam  Constitutional  Appearance: patient appears well-developed, well-nourished and well-groomed,  Communication/Voice: communication appropriate for developmental age; vocal quality normal;  Head and  Face  Appearance: head appears normal, face appears normal and face appears atraumatic;  Ear  Hearing: intact;  Auricles: right auricle normal; left auricle normal;  External Mastoids: right external mastoid normal; left external mastoid normal;  Ear Canals: bilateral ear canals impacted cerumen observed;  Tympanic Membranes: right tympanic membrane normal; left tympanic membrane normal;  Neurovestibular  Mental Status: alert and oriented;  Psychiatric: mood normal; affect is appropriate;       Assessment:  ENCOUNTER DIAGNOSES     ICD-10-CM   1. Bilateral impacted cerumen  H61.23       Plan:  Medical records reviewed on 12/24/2023.  Cerumen impaction removed AU    Orders Placed This Encounter    [phone] - REMOVAL IMPACTED CERUMEN W/ INSTRUMENT, UNILATERAL (AMB ONLY-PD)     Return in about 1 year (around 12/23/2024).    [name], FNP-BC  12/24/2023, 09:47          [1]   Patient Active Problem List  Diagnosis    Vitamin D  deficiency    Hypothyroid    GERD (gastroesophageal reflux disease)    Primary hypertension    Neural  foraminal stenosis of cervical spine    Osteophyte of cervical spine    S/P hysterectomy    Kidney cancer, primary, with metastasis from kidney to other site, left (CMS HCC)    Shortness of breath   [2]   Allergies  Allergen Reactions    Aldactone [Spironolactone] Itching   [3]   Social History  Tobacco Use    Smoking status: Never    Smokeless tobacco: Never   Vaping Use    Vaping status: Never Used   Substance Use Topics    Alcohol  use: Never    Drug use: Never

## 2023-12-29 ENCOUNTER — Other Ambulatory Visit (INDEPENDENT_AMBULATORY_CARE_PROVIDER_SITE_OTHER): Payer: Self-pay | Admitting: NURSE PRACTITIONER

## 2024-01-03 ENCOUNTER — Other Ambulatory Visit: Payer: Self-pay

## 2024-01-04 ENCOUNTER — Ambulatory Visit
Admission: RE | Admit: 2024-01-04 | Discharge: 2024-01-04 | Disposition: A | Discharge status: Home / Self Care | Source: Ambulatory Visit | Attending: NURSE PRACTITIONER

## 2024-01-04 DIAGNOSIS — C642 Malignant neoplasm of left kidney, except renal pelvis: Secondary | ICD-10-CM | POA: Insufficient documentation

## 2024-01-04 DIAGNOSIS — R911 Solitary pulmonary nodule: Secondary | ICD-10-CM

## 2024-01-04 MED ORDER — BARIUM SULFATE 2 % (W/V) ORAL SUSPENSION
450.0000 mL | ORAL | Status: AC
Start: 2024-01-04 — End: 2024-01-04
  Administered 2024-01-04: 450 mL via ORAL

## 2024-01-04 MED ORDER — IOPAMIDOL 370 MG IODINE/ML (76 %) INTRAVENOUS SOLUTION
75.0000 mL | INTRAVENOUS | Status: AC
Start: 2024-01-04 — End: 2024-01-04
  Administered 2024-01-04: 75 mL via INTRAVENOUS
  Filled 2024-01-04: qty 100

## 2024-01-05 ENCOUNTER — Ambulatory Visit (INDEPENDENT_AMBULATORY_CARE_PROVIDER_SITE_OTHER): Payer: Self-pay | Admitting: NURSE PRACTITIONER

## 2024-01-28 ENCOUNTER — Encounter (INDEPENDENT_AMBULATORY_CARE_PROVIDER_SITE_OTHER): Payer: Self-pay

## 2024-01-29 ENCOUNTER — Other Ambulatory Visit (INDEPENDENT_AMBULATORY_CARE_PROVIDER_SITE_OTHER): Payer: Self-pay | Admitting: NURSE PRACTITIONER

## 2024-01-29 DIAGNOSIS — Z1239 Encounter for other screening for malignant neoplasm of breast: Secondary | ICD-10-CM

## 2024-02-01 ENCOUNTER — Other Ambulatory Visit (INDEPENDENT_AMBULATORY_CARE_PROVIDER_SITE_OTHER): Payer: Self-pay | Admitting: Surgery

## 2024-02-09 ENCOUNTER — Other Ambulatory Visit (INDEPENDENT_AMBULATORY_CARE_PROVIDER_SITE_OTHER): Payer: Self-pay | Admitting: NURSE PRACTITIONER

## 2024-02-16 ENCOUNTER — Other Ambulatory Visit: Payer: Self-pay

## 2024-02-17 ENCOUNTER — Ambulatory Visit: Payer: Self-pay | Attending: NURSE PRACTITIONER | Admitting: NURSE PRACTITIONER

## 2024-02-17 ENCOUNTER — Encounter (INDEPENDENT_AMBULATORY_CARE_PROVIDER_SITE_OTHER): Payer: Self-pay | Admitting: NURSE PRACTITIONER

## 2024-02-17 VITALS — BP 139/79 | HR 57 | Resp 18 | Ht 69.02 in | Wt 230.0 lb

## 2024-02-17 DIAGNOSIS — Z23 Encounter for immunization: Secondary | ICD-10-CM | POA: Insufficient documentation

## 2024-02-17 DIAGNOSIS — K219 Gastro-esophageal reflux disease without esophagitis: Secondary | ICD-10-CM | POA: Insufficient documentation

## 2024-02-17 DIAGNOSIS — I10 Essential (primary) hypertension: Secondary | ICD-10-CM | POA: Insufficient documentation

## 2024-02-17 DIAGNOSIS — E039 Hypothyroidism, unspecified: Secondary | ICD-10-CM | POA: Insufficient documentation

## 2024-02-17 NOTE — Progress Notes (Signed)
 INTERNAL MEDICINE, [facility]  [address]  [place] [address]  Operated by [facility]     Name: Taylor Smith MRN:  [id number]   Date: 02/17/2024 Age: 53 y.o.       Chief Complaint:    Chief Complaint   Patient presents with    Follow Up 6 Months        HPI:    History of Present Illness  Taylor Smith is a 53 year old female who presents for lab work and follow-up.    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  - Manages symptoms with Imodium, which provides temporary relief for 1-2 days before recurrence    Arthralgia and knee pain  - Joint pain, predominantly in the knee, onset after initiation of Keytruda   - Pain severity occasionally impairs ambulation  - Experiences burning pain in the knee after prolonged walking, such as during shopping  - Unable to take NSAIDs for pain management  - Considering turmeric-based supplement for inflammation  - Scheduled for MRI of the knee later today for further evaluation    Dyspnea  - History of shortness of breath, now resolved and no longer constant  - No current chest pain or headaches    Gastroesophageal reflux symptoms  - Not currently experiencing reflux symptoms  - Uses omeprazole  on an as-needed basis only    Thyroid  function  - Last thyroid  level checked in August was normal  - No current symptoms suggestive of thyroid  dysfunction    Hypertension  - blood pressure well controlled.   - No chest pain, shortness of breath or headaches.         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 to other site, left (CMS [facility])          Past Surgical History:   Procedure Laterality Date    HX CESAREAN SECTION      HX CHOLECYSTECTOMY      HX CYST REMOVAL Right     HX HYSTERECTOMY      NEPHROURETERECTOMY Left     TOTAL THYROIDECTOMY        Current Outpatient Medications   Medication Sig    azelastine  (ASTELIN ) 137 mcg (0.1 %) Nasal Spray, Non-Aerosol Administer 1 Spray into each nostril Twice daily Use in each nostril as directed Indications: non-seasonal allergic stuffy and runny nose    ergocalciferol , vitamin D2, (DRISDOL ) 1,250 mcg (50,000 unit) Oral Capsule TAKE 1 CAPSULE BY MOUTH ONE TIME PER WEEK    gabapentin  (NEURONTIN ) 300 mg Oral Capsule Take 1 Capsule (300 mg total) by mouth Once a day    levothyroxine  (SYNTHROID) 150 mcg Oral Tablet TAKE 1 TABLET BY MOUTH EVERY MORNING.    omeprazole  (PRILOSEC) 40 mg Oral Capsule, Delayed Release(E.C.) TAKE 1 CAPSULE (40 MG TOTAL) BY MOUTH DAILY.    valsartan  (DIOVAN ) 40 mg Oral Tablet TAKE 2 TABLETS (80 MG TOTAL) BY MOUTH TWICE DAILY     Allergies[1]    Family History:  Family  Medical History:       Problem Relation (Age of Onset)    Cancer Maternal Aunt, Maternal Grandmother    Colon Cancer Maternal Uncle    Lung Cancer Mother    Melanoma Maternal Aunt              Social History:   Tobacco Use History[2]  Social History     Substance and Sexual Activity   Alcohol  Use Never     Social History     Occupational History    Not on file       Review of Systems:  Review of systems as discussed in HPI    Problem List:  Problem List[3]    Physical Examination:  BP 139/79   Pulse 57   Resp 18   Ht 1.753 m (5' 9.02)   Wt 104 kg (230 lb)   SpO2 96%   BMI 33.95 kg/m       Physical Exam  Vitals and nursing note reviewed.   Constitutional:       General: She is not in acute distress.     Appearance: Normal appearance. She is not ill-appearing or diaphoretic.   HENT:      Head: Normocephalic and atraumatic.   Cardiovascular:      Rate and Rhythm: Normal rate.       Pulses: Normal pulses.      Heart sounds: Normal heart sounds, S1 normal and S2 normal.   Pulmonary:      Effort: Pulmonary effort is normal.      Breath sounds: Normal breath sounds.   Abdominal:      General: Abdomen is flat. Bowel sounds are normal.      Palpations: Abdomen is soft.   Musculoskeletal:      Cervical back: Normal range of motion.   Skin:     General: Skin is warm and dry.      Capillary Refill: Capillary refill takes less than 2 seconds.   Neurological:      General: No focal deficit present.      Mental Status: She is alert and oriented to person, place, and time.   Psychiatric:         Mood and Affect: Mood normal.        Data Reviewed:  Hospital Outpatient Visit on 12/03/2023   Component Date Value Ref Range Status    SIGNATERA TEST RESULT 12/03/2023 NEGATIVE   Final    SIGNATERA MTM READOUT 12/03/2023 0  MTM/ml Final    Comment: Please see the attached PDF for more information.  Limitations  Signatera is a personalized, tumor-informed test for the longitudinal detection of circulating tumor DNA (ctDNA). Interval testing is recommended for all patients. Studies have demonstrated that when ctDNA is detected (Signatera Positive) following surgery or definitive treatment, the risk for disease relapse is high without further treatment. Conversely, when ctDNA is not detected, the patient may be considered at lower risk for relapse. For those with multiple timepoints, upward trending ctDNA levels are suggestive of increasing tumor burden (1,2). For a single time point in isolation, the absolute MTM/mL value has no known clinical significance and should not be compared across patients. Test results should be interpreted in context of other clinicopathological features. ctDNA detection sensitivity may be limited due to blood collection within two weeks of surgery and while the patient is on therapy. Signatera is a  quantitative test and reports in units of mean tumor  molecules per   ml (MTM/mL), which is comprised of three measured components (plasma volume, cell free DNA (cfDNA) concentration, and Variant Allele Frequency (VAF)). The MTM/mL number will be qualified if any measured component falls outside the analytical measurement range for that component. The analytical sensitivity is 95% at the limit of detection (0.3 MTM/mL). Results obtained are specific to the assessed time point. A negative test result does not definitively indicate the absence of cancer. This test is not designed to detect or report germline variation, nor does it infer hereditary cancer risk for the patient. Each Signatera assay is designed to a single tumor for a given patient. At this time, multiple personalized Signatera assays cannot be developed for the same patient. This test is designed to detect ctDNA from the assayed tumor only; new primary tumors will not be detected. There is a low risk that a new primary may                            share a variant that could interfere with the Signatera test.   Testing cannot be performed in patients who are pregnant, have a history of bone marrow transplant, or history of blood transfusion within three months. This test is expected to have limited sensitivity in cancer types such as GIST, renal cell carcinomas, primary brain tumors, and lymphoma due to limited ctDNA shed. 1 Bratman SV, Diona HONER, Iafolla MAJ, et al. Personalized circulating tumor DNA analysis as a predictive biomarker in solid tumor patients treated with pembrolizumab . Nature Cancer. 2020;1(9):873-881. 2 Henriksen TV, Tarazona N, et al., Circulating Tumor DNA in Stage III Colorectal Cancer, beyond Minimal Residual Disease Detection, toward Assessment of Adjuvant Therapy Efficacy and Clinical Behavior of Recurrences. Clin Cancer Res. 2021; 28(3):507-517.  Methodology  FFPE samples are assessed by a pathologist to identify tumor margins and percent tumor content. Tumor DNA is extracted  using Qiagen AllPrep. Whole                            genomic DNA is isolated from peripheral blood using QIAamp DNA Blood Mini Kit to provide a baseline DNA sequence. Circulating tumor DNA (ctDNA) is extracted from plasma derived from whole blood samples collected in cell-free DNA blood tubes (Streck) using the QIAsymphony automated or manual extraction method (Qiagen). Using a proprietary algorithm, putative, clonal variants present in the tumor but absent in the germline DNA are identified to design the customized multiplex PCR assay. Whole-exome sequencing is performed on tumor and peripheral blood DNA using KAPA HyperPrep warden/ranger (Roche) with a custom xGen exome capture (IDT). Using a proprietary algorithm, putative clonal variants present in the tumor but absent in the germline DNA are identified to design the customized multiplex PCR assay. Circulating tumor DNA is extracted from   plasma collected in Streck tubes using [employer] proprietary methods. The customized PCR assays are run to detect presence or absence of these                            variants within circulating plasma. A patient's plasma sample is considered ctDNA positive when at least two individual-specific tumor variants are detected. When fewer than two individual-specific tumor variants are observed, a negative result is issued. Tumor variation outside of the individual, tumor specific variants is not assessed. Pathology  services and whole exome sequencing are performed at [facility] (CLIA ID# [id number]) [address] [place], [place] [address].  Disclaimer  The extraction, designer, television/film set, and sequencing for this test were performed by [facility] [address], [place] [address] (CLIA [place] [id number]). The data analysis and reporting for this test were performed by [facility] [address], [place] [address] (CLIA [place] [id number]). This test was developed and its performance characteristics  determined by [facility]. The test has not been cleared or approved by the U.S. Food and Drug Administration (FDA). CAP                            accredited, ISO 13485 certified, and CLIA certified. Pathology services and whole exome sequencing for this test were performed by [facility], [address], [place] [address], CLIA # [id number].  2021 [employer] All Rights Reserved.    MAGNESIUM 12/03/2023 2.0  1.9 - 2.7 mg/dL Final    WBC 91/85/7974 4.3  3.8 - 11.8 x10^3/uL Final    RBC 12/03/2023 4.84  3.63 - 4.92 x10^6/uL Final    HGB 12/03/2023 14.7 (H)  10.9 - 14.3 g/dL Final    HCT 91/85/7974 42.5 (H)  31.2 - 41.9 % Final    MCV 12/03/2023 87.7  75.5 - 95.3 fL Final    MCH 12/03/2023 30.4  24.7 - 32.8 pg Final    MCHC 12/03/2023 34.7  32.3 - 35.6 g/dL Final    RDW 91/85/7974 13.8  12.3 - 17.7 % Final    PLATELETS 12/03/2023 138 (L)  140 - 440 x10^3/uL Final    MPV 12/03/2023 10.3  7.9 - 10.8 fL Final    SODIUM 12/03/2023 141  136 - 145 mmol/L Final    POTASSIUM 12/03/2023 4.0  3.5 - 5.1 mmol/L Final    CHLORIDE 12/03/2023 108 (H)  98 - 107 mmol/L Final    CO2 TOTAL 12/03/2023 26  21 - 31 mmol/L Final    ANION GAP 12/03/2023 7  4 - 13 mmol/L Final    BUN 12/03/2023 16  7 - 25 mg/dL Final    CREATININE 91/85/7974 1.18  0.60 - 1.30 mg/dL Final    BUN/CREA RATIO 12/03/2023 14  6 - 22 Final    ESTIMATED GFR 12/03/2023 55 (L)  >59 mL/min/1.33m^2 Final    ALBUMIN 12/03/2023 4.7  3.5 - 5.7 g/dL Final    CALCIUM 91/85/7974 9.7  8.6 - 10.3 mg/dL Final    GLUCOSE 91/85/7974 99  74 - 109 mg/dL Final    ALKALINE PHOSPHATASE 12/03/2023 74  34 - 104 U/L Final    ALT (SGPT) 12/03/2023 28  7 - 52 U/L Final    AST (SGOT) 12/03/2023 26  13 - 39 U/L Final    BILIRUBIN TOTAL 12/03/2023 0.7  0.3 - 1.0 mg/dL Final    PROTEIN TOTAL 12/03/2023 7.8  6.4 - 8.9 g/dL Final    ALBUMIN/GLOBULIN RATIO 12/03/2023 1.5 (H)  0.8 - 1.4 Final    OSMOLALITY, CALCULATED 12/03/2023 283  270 - 290 mOsm/kg Final    CALCIUM, CORRECTED 12/03/2023 9.1  8.9 -  10.8 mg/dL Final    GLOBULIN 91/85/7974 3.1  2.0 - 3.5 Final    TSH 12/03/2023 3.770  0.450 - 5.330 uIU/mL Final   Hospital Outpatient Visit on 10/22/2023   Component Date Value Ref Range Status    SODIUM 10/22/2023 142  136 - 145 mmol/L Final    POTASSIUM 10/22/2023 4.0  3.5 - 5.1 mmol/L Final    CHLORIDE 10/22/2023 107  98 - 107 mmol/L Final    CO2 TOTAL 10/22/2023 29  21 - 31 mmol/L Final    ANION GAP 10/22/2023 6  4 - 13 mmol/L Final    BUN 10/22/2023 15  7 - 25 mg/dL Final    CREATININE 92/96/7974 1.11  0.60 - 1.30 mg/dL Final    BUN/CREA RATIO 10/22/2023 14  6 - 22 Final    ESTIMATED GFR 10/22/2023 59 (L)  >59 mL/min/1.62m^2 Final    ALBUMIN 10/22/2023 4.7  3.5 - 5.7 g/dL Final    CALCIUM 92/96/7974 9.7  8.6 - 10.3 mg/dL Final    GLUCOSE 92/96/7974 97  74 - 109 mg/dL Final    ALKALINE PHOSPHATASE 10/22/2023 69  34 - 104 U/L Final    ALT (SGPT) 10/22/2023 35  7 - 52 U/L Final    AST (SGOT) 10/22/2023 29  13 - 39 U/L Final    BILIRUBIN TOTAL 10/22/2023 0.8  0.3 - 1.0 mg/dL Final    PROTEIN TOTAL 10/22/2023 7.7  6.4 - 8.9 g/dL Final    ALBUMIN/GLOBULIN RATIO 10/22/2023 1.6 (H)  0.8 - 1.4 Final    OSMOLALITY, CALCULATED 10/22/2023 284  270 - 290 mOsm/kg Final    CALCIUM, CORRECTED 10/22/2023 9.1  8.9 - 10.8 mg/dL Final    GLOBULIN 92/96/7974 3.0  2.0 - 3.5 Final    MAGNESIUM 10/22/2023 1.8 (L)  1.9 - 2.7 mg/dL Final    TSH 92/96/7974 1.564  0.450 - 5.330 uIU/mL Final    WBC 10/22/2023 3.7 (L)  3.8 - 11.8 x10^3/uL Final    RBC 10/22/2023 4.90  3.63 - 4.92 x10^6/uL Final    HGB 10/22/2023 15.0 (H)  10.9 - 14.3 g/dL Final    HCT 92/96/7974 42.9 (H)  31.2 - 41.9 % Final    MCV 10/22/2023 87.5  75.5 - 95.3 fL Final    MCH 10/22/2023 30.7  24.7 - 32.8 pg Final    MCHC 10/22/2023 35.0  32.3 - 35.6 g/dL Final    RDW 92/96/7974 13.3  12.3 - 17.7 % Final    PLATELETS 10/22/2023 132 (L)  140 - 440 x10^3/uL Final    MPV 10/22/2023 10.5  7.9 - 10.8 fL Final    NEUTROPHIL % 10/22/2023 59  43 - 77 % Final    LYMPHOCYTE %  10/22/2023 29  16 - 46 % Final    MONOCYTE % 10/22/2023 8  4 - 11 % Final    EOSINOPHIL % 10/22/2023 4  1 - 7 % Final    BASOPHIL % 10/22/2023 1  0 - 1 % Final    NEUTROPHIL # 10/22/2023 2.20  1.90 - 8.20 x10^3/uL Final    LYMPHOCYTE # 10/22/2023 1.00 (L)  1.10 - 3.10 x10^3/uL Final    MONOCYTE # 10/22/2023 0.30  0.20 - 0.90 x10^3/uL Final    EOSINOPHIL # 10/22/2023 0.10  0.00 - 0.50 x10^3/uL Final    BASOPHIL # 10/22/2023 0.00  0.00 - 0.10 x10^3/uL Final   Hospital Outpatient Visit on 10/01/2023   Component Date Value Ref Range Status    SODIUM 10/01/2023 141  136 - 145 mmol/L Final    POTASSIUM 10/01/2023 3.8  3.5 - 5.1 mmol/L Final    CHLORIDE 10/01/2023 108 (H)  98 - 107 mmol/L Final    CO2 TOTAL 10/01/2023 25  21 - 31 mmol/L Final  ANION GAP 10/01/2023 8  4 - 13 mmol/L Final    BUN 10/01/2023 12  7 - 25 mg/dL Final    CREATININE 93/87/7974 1.13  0.60 - 1.30 mg/dL Final    BUN/CREA RATIO 10/01/2023 11  6 - 22 Final    ESTIMATED GFR 10/01/2023 58 (L)  >59 mL/min/1.5m^2 Final    ALBUMIN 10/01/2023 4.6  3.5 - 5.7 g/dL Final    CALCIUM 93/87/7974 9.5  8.6 - 10.3 mg/dL Final    GLUCOSE 93/87/7974 93  74 - 109 mg/dL Final    ALKALINE PHOSPHATASE 10/01/2023 67  34 - 104 U/L Final    ALT (SGPT) 10/01/2023 31  7 - 52 U/L Final    AST (SGOT) 10/01/2023 26  13 - 39 U/L Final    BILIRUBIN TOTAL 10/01/2023 0.9  0.3 - 1.0 mg/dL Final    PROTEIN TOTAL 10/01/2023 7.4  6.4 - 8.9 g/dL Final    ALBUMIN/GLOBULIN RATIO 10/01/2023 1.6 (H)  0.8 - 1.4 Final    OSMOLALITY, CALCULATED 10/01/2023 281  270 - 290 mOsm/kg Final    CALCIUM, CORRECTED 10/01/2023 9.0  8.9 - 10.8 mg/dL Final    GLOBULIN 93/87/7974 2.8  2.0 - 3.5 Final    MAGNESIUM 10/01/2023 1.9  1.9 - 2.7 mg/dL Final    TSH 93/87/7974 1.125  0.450 - 5.330 uIU/mL Final    WBC 10/01/2023 3.9  3.8 - 11.8 x10^3/uL Final    RBC 10/01/2023 5.01 (H)  3.63 - 4.92 x10^6/uL Final    HGB 10/01/2023 15.2 (H)  10.9 - 14.3 g/dL Final    HCT 93/87/7974 43.9 (H)  31.2 - 41.9 % Final    MCV  10/01/2023 87.7  75.5 - 95.3 fL Final    MCH 10/01/2023 30.4  24.7 - 32.8 pg Final    MCHC 10/01/2023 34.7  32.3 - 35.6 g/dL Final    RDW 93/87/7974 13.4  12.3 - 17.7 % Final    PLATELETS 10/01/2023 149  140 - 440 x10^3/uL Final    MPV 10/01/2023 10.2  7.9 - 10.8 fL Final    NEUTROPHIL % 10/01/2023 60  43 - 77 % Final    LYMPHOCYTE % 10/01/2023 28  16 - 46 % Final    MONOCYTE % 10/01/2023 8  4 - 11 % Final    EOSINOPHIL % 10/01/2023 4  1 - 7 % Final    BASOPHIL % 10/01/2023 1  0 - 1 % Final    NEUTROPHIL # 10/01/2023 2.30  1.90 - 8.20 x10^3/uL Final    LYMPHOCYTE # 10/01/2023 1.10  1.10 - 3.10 x10^3/uL Final    MONOCYTE # 10/01/2023 0.30  0.20 - 0.90 x10^3/uL Final    EOSINOPHIL # 10/01/2023 0.10  0.00 - 0.50 x10^3/uL Final    BASOPHIL # 10/01/2023 0.00  0.00 - 0.10 x10^3/uL Final   Admission on 09/26/2023, Discharged on 09/27/2023   Component Date Value Ref Range Status    SODIUM 09/26/2023 141  136 - 145 mmol/L Final    POTASSIUM 09/26/2023 3.7  3.5 - 5.1 mmol/L Final    CHLORIDE 09/26/2023 105  98 - 107 mmol/L Final    CO2 TOTAL 09/26/2023 26  21 - 32 mmol/L Final    ANION GAP 09/26/2023 10  4 - 13 mmol/L Final    BUN 09/26/2023 15  7 - 18 mg/dL Final    CREATININE 93/92/7974 1.14 (H)  0.55 - 1.02 mg/dL Final    BUN/CREA RATIO 09/26/2023 13   Final  ESTIMATED GFR 09/26/2023 57 (L)  >59 mL/min/1.3m^2 Final    ALBUMIN 09/26/2023 4.2  3.4 - 5.0 g/dL Final    Previously suppressed result.    CALCIUM 09/26/2023 9.3  8.5 - 10.1 mg/dL Final    GLUCOSE 93/92/7974 130 (H)  74 - 106 mg/dL Final    ALKALINE PHOSPHATASE 09/26/2023 89  46 - 116 U/L Final    Previously suppressed result.    ALT (SGPT) 09/26/2023 45  <=78 U/L Final    Previously suppressed result.    AST (SGOT) 09/26/2023 32  15 - 37 U/L Final    Previously suppressed result.    BILIRUBIN TOTAL 09/26/2023 0.5  0.2 - 1.0 mg/dL Final    Previously suppressed result.    PROTEIN TOTAL 09/26/2023 7.8  6.4 - 8.2 g/dL Final    Previously suppressed result.     ALBUMIN/GLOBULIN RATIO 09/26/2023 1.2  0.8 - 1.4 Final    Comment: Calculation not performed.    Calculation not performed.  Calculation not performed.                             Previously suppressed result.    OSMOLALITY, CALCULATED 09/26/2023 284  270 - 290 mOsm/kg Final    CALCIUM, CORRECTED 09/26/2023 9.1  mg/dL Final    Previously suppressed result.    GLOBULIN 09/26/2023 3.6   Final    Comment: Calculation not performed.    Calculation not performed.                               Previously suppressed result.    TROPONIN I 09/26/2023 3  <15 ng/L Final    Ventricular rate 09/26/2023 62  BPM Final    Atrial Rate 09/26/2023 62  BPM Final    PR Interval 09/26/2023 180  ms Final    QRS Duration 09/26/2023 116  ms Final    QT Interval 09/26/2023 460  ms Final    QTC Calculation 09/26/2023 466  ms Final    Calculated P Axis 09/26/2023 60  degrees Final    Calculated R Axis 09/26/2023 59  degrees Final    Calculated T Axis 09/26/2023 46  degrees Final    WBC 09/26/2023 4.4  3.8 - 11.8 x10^3/uL Final    RBC 09/26/2023 4.49  3.63 - 4.92 x10^6/uL Final    HGB 09/26/2023 14.2  10.9 - 14.3 g/dL Final    HCT 93/92/7974 40.6  31.2 - 41.9 % Final    MCV 09/26/2023 90.4  75.5 - 95.3 fL Final    MCH 09/26/2023 31.7  24.7 - 32.8 pg Final    MCHC 09/26/2023 35.0  32.3 - 35.6 g/dL Final    RDW 93/92/7974 15.8  12.3 - 17.7 % Final    PLATELETS 09/26/2023 131 (L)  140 - 440 x10^3/uL Final    MPV 09/26/2023 10.0  7.9 - 10.8 fL Final    NEUTROPHIL % 09/26/2023 53  43 - 77 % Final    LYMPHOCYTE % 09/26/2023 32  16 - 46 % Final    MONOCYTE % 09/26/2023 11  4 - 11 % Final    EOSINOPHIL % 09/26/2023 3  1 - 7 % Final    BASOPHIL % 09/26/2023 0  0 - 1 % Final    NEUTROPHIL # 09/26/2023 2.36  1.90 - 8.20 x10^3/uL Final    LYMPHOCYTE # 09/26/2023 1.42  1.10 - 3.10 x10^3/uL Final    MONOCYTE # 09/26/2023 0.49  0.20 - 0.90 x10^3/uL Final    EOSINOPHIL # 09/26/2023 0.14  0.00 - 0.50 x10^3/uL Final    BASOPHIL # 09/26/2023 0.01  0.00 - 0.10  x10^3/uL Final   Hospital Outpatient Visit on 09/10/2023   Component Date Value Ref Range Status    SODIUM 09/10/2023 141  136 - 145 mmol/L Final    POTASSIUM 09/10/2023 3.6  3.5 - 5.1 mmol/L Final    CHLORIDE 09/10/2023 107  98 - 107 mmol/L Final    CO2 TOTAL 09/10/2023 27  21 - 31 mmol/L Final    ANION GAP 09/10/2023 7  4 - 13 mmol/L Final    BUN 09/10/2023 12  7 - 25 mg/dL Final    CREATININE 94/77/7974 1.12  0.60 - 1.30 mg/dL Final    BUN/CREA RATIO 09/10/2023 11  6 - 22 Final    ESTIMATED GFR 09/10/2023 58 (L)  >59 mL/min/1.10m^2 Final    ALBUMIN 09/10/2023 4.5  3.5 - 5.7 g/dL Final    CALCIUM 94/77/7974 9.4  8.6 - 10.3 mg/dL Final    GLUCOSE 94/77/7974 88  74 - 109 mg/dL Final    ALKALINE PHOSPHATASE 09/10/2023 70  34 - 104 U/L Final    ALT (SGPT) 09/10/2023 34  7 - 52 U/L Final    AST (SGOT) 09/10/2023 30  13 - 39 U/L Final    BILIRUBIN TOTAL 09/10/2023 0.8  0.3 - 1.0 mg/dL Final    PROTEIN TOTAL 09/10/2023 7.4  6.4 - 8.9 g/dL Final    ALBUMIN/GLOBULIN RATIO 09/10/2023 1.6 (H)  0.8 - 1.4 Final    OSMOLALITY, CALCULATED 09/10/2023 281  270 - 290 mOsm/kg Final    CALCIUM, CORRECTED 09/10/2023 9.0  8.9 - 10.8 mg/dL Final    GLOBULIN 94/77/7974 2.9  2.0 - 3.5 Final    MAGNESIUM 09/10/2023 1.8 (L)  1.9 - 2.7 mg/dL Final    TSH 94/77/7974 1.233  0.450 - 5.330 uIU/mL Final    WBC 09/10/2023 3.9  3.8 - 11.8 x10^3/uL Final    RBC 09/10/2023 4.69  3.63 - 4.92 x10^6/uL Final    HGB 09/10/2023 14.3  10.9 - 14.3 g/dL Final    HCT 94/77/7974 41.5  31.2 - 41.9 % Final    MCV 09/10/2023 88.5  75.5 - 95.3 fL Final    MCH 09/10/2023 30.6  24.7 - 32.8 pg Final    MCHC 09/10/2023 34.6  32.3 - 35.6 g/dL Final    RDW 94/77/7974 13.6  12.3 - 17.7 % Final    PLATELETS 09/10/2023 133 (L)  140 - 440 x10^3/uL Final    MPV 09/10/2023 10.0  7.9 - 10.8 fL Final    NEUTROPHIL % 09/10/2023 60  43 - 77 % Final    LYMPHOCYTE % 09/10/2023 28  16 - 46 % Final    MONOCYTE % 09/10/2023 9  4 - 11 % Final    EOSINOPHIL % 09/10/2023 3  1 - 7 % Final     BASOPHIL % 09/10/2023 1  0 - 1 % Final    NEUTROPHIL # 09/10/2023 2.40  1.90 - 8.20 x10^3/uL Final    LYMPHOCYTE # 09/10/2023 1.10  1.10 - 3.10 x10^3/uL Final    MONOCYTE # 09/10/2023 0.30  0.20 - 0.90 x10^3/uL Final    EOSINOPHIL # 09/10/2023 0.10  0.00 - 0.50 x10^3/uL Final    BASOPHIL # 09/10/2023 0.00  0.00 - 0.10 x10^3/uL Final  SIGNATERA TEST RESULT 09/10/2023 NEGATIVE   Final    SIGNATERA MTM READOUT 09/10/2023 0  MTM/ml Final    Comment: Limitations  Signatera is a personalized, tumor-informed test for the longitudinal detection of circulating tumor DNA (ctDNA). Interval testing is recommended for all patients. Studies have demonstrated that when ctDNA is detected (Signatera Positive) following surgery or definitive treatment, the risk for disease relapse is high without further treatment. Conversely, when ctDNA is not detected, the patient may be considered at lower risk for relapse. For those with multiple timepoints, upward trending ctDNA levels are suggestive of increasing tumor burden (1,2). For a single time point in isolation, the absolute MTM/mL value has no known clinical significance and should not be compared across patients. Test results should be interpreted in context of other clinicopathological features. ctDNA detection sensitivity may be limited due to blood collection within two weeks of surgery and while the patient is on therapy. Signatera is a quantitative test and reports in units of mean tumor                            molecules per   ml (MTM/mL), which is comprised of three measured components (plasma volume, cell free DNA (cfDNA) concentration, and Variant Allele Frequency (VAF)). The MTM/mL number will be qualified if any measured component falls outside the analytical measurement range for that component. The analytical sensitivity is 95% at the limit of detection (0.3 MTM/mL). Results obtained are specific to the assessed time point. A negative test result does not  definitively indicate the absence of cancer. This test is not designed to detect or report germline variation, nor does it infer hereditary cancer risk for the patient. Each [employer] assay is designed to a single tumor for a given patient. At this time, multiple personalized [employer] assays cannot be developed for the same patient. This test is designed to detect ctDNA from the assayed tumor only; new primary tumors will not be detected. There is a low risk that a new primary may share a variant that could interfere with the                            Signatera test.   Testing cannot be performed in patients who are pregnant, have a history of bone marrow transplant, or history of blood transfusion within three months. This test is expected to have limited sensitivity in cancer types such as GIST, renal cell carcinomas, primary brain tumors, and lymphoma due to limited ctDNA shed. 1 Bratman SV, Diona HONER, Iafolla MAJ, et al. Personalized circulating tumor DNA analysis as a predictive biomarker in solid tumor patients treated with pembrolizumab . Nature Cancer. 2020;1(9):873-881. 2 Henriksen TV, Tarazona N, et al., Circulating Tumor DNA in Stage III Colorectal Cancer, beyond Minimal Residual Disease Detection, toward Assessment of Adjuvant Therapy Efficacy and Clinical Behavior of Recurrences. Clin Cancer Res. 2021; 28(3):507-517.  Methodology  FFPE samples are assessed by a pathologist to identify tumor margins and percent tumor content. Tumor DNA is extracted using Qiagen AllPrep. Whole genomic DNA is isolated from peripheral blood                            using QIAamp DNA Blood Mini Kit to provide a baseline DNA sequence. Circulating tumor DNA (ctDNA) is extracted from plasma derived from whole blood samples collected in cell-free DNA blood tubes (Streck)  using the QIAsymphony automated or manual extraction method (Qiagen). Using a proprietary algorithm, putative, clonal variants present in the tumor but absent  in the germline DNA are identified to design the customized multiplex PCR assay. Whole-exome sequencing is performed on tumor and peripheral blood DNA using KAPA HyperPrep warden/ranger (Roche) with a custom xGen exome capture (IDT). Using a proprietary algorithm, putative clonal variants present in the tumor but absent in the germline DNA are identified to design the customized multiplex PCR assay. Circulating tumor DNA is extracted from   plasma collected in Streck tubes using Nater[employer] proprietary methods. The customized PCR assays are run to detect presence or absence of these variants within circulating plasma. A patient's                            plasma sample is considered ctDNA positive when at least two individual-specific tumor variants are detected. When fewer than two individual-specific tumor variants are observed, a negative result is issued. Tumor variation outside of the individual, tumor specific variants is not assessed. Pathology services and whole exome sequencing are performed at [facility] (CLIA ID# [id number]) [address], [place], [place] [address].  Disclaimer  The extraction, designer, television/film set, and sequencing for this test were performed by [facility] [address], [place] [address] (CLIA [place] [id number]). The data analysis and reporting for this test were performed by [facility] [address], [place] [address] (CLIA [place] [id number]). This test was developed and its performance characteristics determined by [facility]. The test has not been cleared or approved by the U.S. Food and Drug Administration (FDA). CAP accredited, ISO 13485 certified, and CLIA certified.                            Pathology services and whole exome sequencing for this test were performed by [facility], [address], [place] [address], CLIA # [id number].  2021 [employer] All Rights Reserved.   Hospital Outpatient Visit on 08/20/2023   Component Date Value Ref Range Status     SODIUM 08/20/2023 141  136 - 145 mmol/L Final    POTASSIUM 08/20/2023 4.0  3.5 - 5.1 mmol/L Final    CHLORIDE 08/20/2023 107  98 - 107 mmol/L Final    CO2 TOTAL 08/20/2023 28  21 - 31 mmol/L Final    ANION GAP 08/20/2023 6  4 - 13 mmol/L Final    BUN 08/20/2023 16  7 - 25 mg/dL Final    CREATININE 94/98/7974 1.23  0.60 - 1.30 mg/dL Final    BUN/CREA RATIO 08/20/2023 13  6 - 22 Final    ESTIMATED GFR 08/20/2023 52 (L)  >59 mL/min/1.73m^2 Final    ALBUMIN 08/20/2023 4.6  3.5 - 5.7 g/dL Final    CALCIUM 94/98/7974 9.9  8.6 - 10.3 mg/dL Final    GLUCOSE 94/98/7974 81  74 - 109 mg/dL Final    ALKALINE PHOSPHATASE 08/20/2023 75  34 - 104 U/L Final    ALT (SGPT) 08/20/2023 31  7 - 52 U/L Final    AST (SGOT) 08/20/2023 28  13 - 39 U/L Final    BILIRUBIN TOTAL 08/20/2023 0.7  0.3 - 1.0 mg/dL Final    PROTEIN TOTAL 08/20/2023 7.6  6.4 - 8.9 g/dL Final    ALBUMIN/GLOBULIN RATIO 08/20/2023 1.5 (H)  0.8 - 1.4 Final  OSMOLALITY, CALCULATED 08/20/2023 282  270 - 290 mOsm/kg Final    CALCIUM, CORRECTED 08/20/2023 9.4  8.9 - 10.8 mg/dL Final    GLOBULIN 94/98/7974 3.0  2.0 - 3.5 Final    MAGNESIUM 08/20/2023 2.0  1.9 - 2.7 mg/dL Final    TSH 94/98/7974 2.768  0.450 - 5.330 uIU/mL Final    WBC 08/20/2023 4.5  3.8 - 11.8 x10^3/uL Final    RBC 08/20/2023 4.76  3.63 - 4.92 x10^6/uL Final    HGB 08/20/2023 14.3  10.9 - 14.3 g/dL Final    HCT 94/98/7974 42.4 (H)  31.2 - 41.9 % Final    MCV 08/20/2023 89.1  75.5 - 95.3 fL Final    MCH 08/20/2023 30.1  24.7 - 32.8 pg Final    MCHC 08/20/2023 33.8  32.3 - 35.6 g/dL Final    RDW 94/98/7974 14.0  12.3 - 17.7 % Final    PLATELETS 08/20/2023 146  140 - 440 x10^3/uL Final    MPV 08/20/2023 9.9  7.9 - 10.8 fL Final    NEUTROPHIL % 08/20/2023 57  43 - 77 % Final    LYMPHOCYTE % 08/20/2023 29  16 - 46 % Final    MONOCYTE % 08/20/2023 10  4 - 11 % Final    EOSINOPHIL % 08/20/2023 3  1 - 7 % Final    BASOPHIL % 08/20/2023 1  0 - 1 % Final    NEUTROPHIL # 08/20/2023 2.50  1.90 - 8.20 x10^3/uL  Final    LYMPHOCYTE # 08/20/2023 1.30  1.10 - 3.10 x10^3/uL Final    MONOCYTE # 08/20/2023 0.40  0.20 - 0.90 x10^3/uL Final    EOSINOPHIL # 08/20/2023 0.10  0.00 - 0.50 x10^3/uL Final    BASOPHIL # 08/20/2023 0.00  0.00 - 0.10 x10^3/uL Final        Health Maintenance:  Health Maintenance   Topic Date Due    DTaP-Tdap-Td Series (1 - Tdap) Never done    Covid-19 Vaccine (Shared decision making) (4 - 2025-26 season) 12/21/2023    Hepatitis B Vaccine (1 of 3 - 19+ 3-dose series) 08/17/2024 (Originally 05/30/1989)    Hepatitis C screening  08/17/2024 (Originally Nov 01, 1970)    Shingles Vaccine (1 of 2) 08/17/2024 (Originally 05/30/1989)    Pneumococcal Vaccination, Age 34+ (1 of 2 - PCV) 08/17/2024 (Originally 05/30/1989)    NonMedicare Preventative Exam  08/17/2024    Breast Cancer Screening  01/27/2025    Depression Screening  02/16/2025    Colonoscopy  04/25/2031    Influenza Vaccine  Completed    HIV Screening  Discontinued        Assessment & Plan   Problem List Items Addressed This Visit          Cardiovascular System    Primary hypertension - Primary (Chronic)    Relevant Orders    BASIC METABOLIC PANEL    LIPID PANEL       Digestive    GERD (gastroesophageal reflux disease) (Chronic)       Endocrine    Hypothyroid (Chronic)     Other Visit Diagnoses         Need for immunization against influenza        Relevant Orders    Flu Vaccine, 6 month-adult,0.5 mL IM (Admin) (Completed)             Depression screening is negative. PHQ 2 Total: 0       Assessment & Plan  Immunization  Flu shot is  due.    Essential hypertension  Blood pressure is well-controlled with current medication regimen. No recent episodes of chest pain, shortness of breath, or headaches reported.    Gastroesophageal reflux disease  Reflux symptoms are well-controlled without regular use of omeprazole . Uses omeprazole  as needed.  - Continue with GERD diet precautions.     Hypothyroidism  Thyroid  levels were stable in August. No current symptoms suggestive  of hypothyroidism.   - Monitor thyroid  levels at next visit unless symptoms develop.    Immune-related arthralgia of knee  Joint pain in the knee, likely related to pembrolizumab  (Keytruda ) treatment. Pain is severe at times, affecting mobility. Plans to start a Tylenol  product with turmeric for inflammation as recommended by Dr. Anderson.  - Start Tylenol  product with turmeric for inflammation.    Immune-related diarrhea  Diarrhea is a side effect of pembrolizumab  (Keytruda ). Managed with Imodium as needed.    General Health Maintenance  - Schedule follow-up in 6 months unless issues arise.    Follow-Up  Follow-up with oncology in November. MRI for knee scheduled for later today.  - Print lab orders for her to take to [facility].  - Call her when lab results are back and paperwork is ready for pickup.    Follow up:  Return in about 6 months (around 08/17/2024), or if symptoms worsen or fail to improve.    This note was partially created using voice recognition software and is inherently subject to errors including those of syntax and sound alike  substitutions which may escape proof reading.  In such instances, original meaning may be extrapolated by contextual derivation.    Rosina Linsey, APRN, CNP  02/17/2024, 10:23       [1]   Allergies  Allergen Reactions    Aldactone [Spironolactone] Itching   [2]   Social History  Tobacco Use   Smoking Status Never   Smokeless Tobacco Never   [3]   Patient Active Problem List  Diagnosis    Vitamin D  deficiency    Hypothyroid    GERD (gastroesophageal reflux disease)    Primary hypertension    Neural foraminal stenosis of cervical spine    Osteophyte of cervical spine    S/P hysterectomy    Kidney cancer, primary, with metastasis from kidney to other site, left (CMS [facility])    Shortness of breath

## 2024-02-19 ENCOUNTER — Encounter (INDEPENDENT_AMBULATORY_CARE_PROVIDER_SITE_OTHER): Payer: Self-pay | Admitting: NURSE PRACTITIONER

## 2024-02-19 DIAGNOSIS — I10 Essential (primary) hypertension: Secondary | ICD-10-CM

## 2024-02-19 LAB — BASIC METABOLIC PANEL
BUN/CREAT RATIO: 60
CREATININE: 1.09

## 2024-02-19 LAB — LIPID PANEL
CHOLESTEROL: 145
HDL-CHOLESTEROL: 41
LDL (CALCULATED): 87
TRIGLYCERIDES: 87
VLDL (CALCULATED): 17

## 2024-03-09 ENCOUNTER — Other Ambulatory Visit: Payer: Self-pay

## 2024-03-10 ENCOUNTER — Encounter (INDEPENDENT_AMBULATORY_CARE_PROVIDER_SITE_OTHER): Payer: Self-pay | Admitting: NURSE PRACTITIONER

## 2024-03-10 ENCOUNTER — Ambulatory Visit: Payer: Self-pay | Attending: NURSE PRACTITIONER | Admitting: NURSE PRACTITIONER

## 2024-03-10 ENCOUNTER — Ambulatory Visit (INDEPENDENT_AMBULATORY_CARE_PROVIDER_SITE_OTHER)
Admission: RE | Admit: 2024-03-10 | Discharge: 2024-03-10 | Disposition: A | Discharge status: Home / Self Care | Payer: Self-pay | Source: Ambulatory Visit | Attending: NURSE PRACTITIONER | Admitting: NURSE PRACTITIONER

## 2024-03-10 VITALS — BP 128/90 | HR 63 | Temp 98.7°F | Ht 69.0 in | Wt 226.6 lb

## 2024-03-10 DIAGNOSIS — R8290 Unspecified abnormal findings in urine: Secondary | ICD-10-CM

## 2024-03-10 DIAGNOSIS — Z7952 Long term (current) use of systemic steroids: Secondary | ICD-10-CM

## 2024-03-10 DIAGNOSIS — C642 Malignant neoplasm of left kidney, except renal pelvis: Secondary | ICD-10-CM

## 2024-03-10 DIAGNOSIS — K521 Toxic gastroenteritis and colitis: Secondary | ICD-10-CM

## 2024-03-10 DIAGNOSIS — Z905 Acquired absence of kidney: Secondary | ICD-10-CM | POA: Insufficient documentation

## 2024-03-10 DIAGNOSIS — T50995A Adverse effect of other drugs, medicaments and biological substances, initial encounter: Secondary | ICD-10-CM | POA: Insufficient documentation

## 2024-03-10 LAB — URINALYSIS, MACROSCOPIC
BILIRUBIN: NEGATIVE mg/dL
BLOOD: NEGATIVE mg/dL
GLUCOSE: NEGATIVE mg/dL
KETONES: NEGATIVE mg/dL
LEUKOCYTES: NEGATIVE WBCs/uL
NITRITE: NEGATIVE
PH: 6 (ref 5.0–9.0)
PROTEIN: NEGATIVE mg/dL
SPECIFIC GRAVITY: 1.018 (ref 1.002–1.030)
UROBILINOGEN: NORMAL mg/dL

## 2024-03-10 LAB — URINALYSIS, MICROSCOPIC
BACTERIA: NEGATIVE /HPF
RBCS: <1 /HPF (ref <4)
SQUAMOUS EPITHELIAL: 2 /HPF (ref <28)
WBCS: <1 /HPF (ref <6)

## 2024-03-10 LAB — CBC
HCT: 43.6 % — ABNORMAL HIGH (ref 31.2–41.9)
HGB: 14.9 g/dL — ABNORMAL HIGH (ref 10.9–14.3)
MCH: 30.5 pg (ref 24.7–32.8)
MCHC: 34.3 g/dL (ref 32.3–35.6)
MCV: 89.1 fL (ref 75.5–95.3)
MPV: 10.2 fL (ref 7.9–10.8)
PLATELETS: 146 x10ˆ3/uL (ref 140–440)
RBC: 4.89 x10ˆ6/uL (ref 3.63–4.92)
RDW: 14 % (ref 12.3–17.7)
WBC: 5.2 x10ˆ3/uL (ref 3.8–11.8)

## 2024-03-10 LAB — COMPREHENSIVE METABOLIC PANEL, NON-FASTING
ALBUMIN/GLOBULIN RATIO: 1.5 — ABNORMAL HIGH (ref 0.8–1.4)
ALBUMIN: 4.7 g/dL (ref 3.5–5.7)
ALKALINE PHOSPHATASE: 73 U/L (ref 34–104)
ALT (SGPT): 26 U/L (ref 7–52)
ANION GAP: 7 mmol/L (ref 4–13)
AST (SGOT): 26 U/L (ref 13–39)
BILIRUBIN TOTAL: 0.9 mg/dL (ref 0.3–1.0)
BUN/CREA RATIO: 12 (ref 6–22)
BUN: 15 mg/dL (ref 7–25)
CALCIUM, CORRECTED: 9 mg/dL (ref 8.9–10.8)
CALCIUM: 9.6 mg/dL (ref 8.6–10.3)
CHLORIDE: 107 mmol/L (ref 98–107)
CO2 TOTAL: 26 mmol/L (ref 21–31)
CREATININE: 1.25 mg/dL (ref 0.60–1.30)
ESTIMATED GFR: 51 mL/min/1.73mˆ2 — ABNORMAL LOW (ref >59)
GLOBULIN: 3.2 (ref 2.0–3.5)
GLUCOSE: 90 mg/dL (ref 74–109)
OSMOLALITY, CALCULATED: 280 mosm/kg (ref 270–290)
POTASSIUM: 3.9 mmol/L (ref 3.5–5.1)
PROTEIN TOTAL: 7.9 g/dL (ref 6.4–8.9)
SODIUM: 140 mmol/L (ref 136–145)

## 2024-03-10 MED ORDER — PREDNISONE 20 MG TABLET: ORAL_TABLET | ORAL | 0 refills | Status: DC

## 2024-03-10 NOTE — Cancer Center Note (Signed)
 Taylor Smith  [id number]  1970/09/16   03/10/2024       Department of Hematology/Oncology  Return Patient Visit           REFERRING PROVIDER:  Fenton Knee, APRN, CNP  [address]  [place],  [place] [address]    PCP:  Knee Fenton, APRN, CNP  [address]  [place] [address]          REASON FOR OFFICE VISIT:  Ongoing management and evaluation of  Renal cell Cancer      HISTORY OF PRESENT ILLNESS:  History of Present Illness  Taylor Smith is a 53 year old female with renal cell carcinoma who presents with gastrointestinal symptoms following completion of Keytruda  treatment.    Gastrointestinal symptoms  - Persistent diarrhea occurring daily since completion of Keytruda  treatment at the end of June  - Diarrhea is triggered by food intake, with chocolate being a significant trigger  - Urgency to defecate immediately after eating, requiring her to 'run' to the bathroom  - Diarrhea managed with Imodium, but symptoms persist  - No fever, chills, nausea, or vomiting  - Weight loss of four pounds, not concerning to her  - Occurs daily and multiple times a day    Musculoskeletal symptoms  - History of joint pain and torn meniscus in the left knee, diagnosed by orthopedic specialist  - Significant improvement in knee pain following steroid injection  - Residual aching in the left knee in the evenings after prolonged activity  - Presence of Baker's cyst in the left knee    Renal function and urinary symptoms  - Recent GFR of 51, indicating mild decrease in renal function  - Urine has a strong odor, described as a 'pneumonia smell'  - No dysuria, hematuria, or other urinary symptoms  - Increasing water  intake to manage urinary odor    Hematologic findings  - Recent laboratory results: white cell count 5.2, hemoglobin 14.9, hematocrit 43.6, platelets 146,000    Constitutional and cardiopulmonary symptoms  - No new lumps or bumps  - No chest pain or shortness of breath          REVIEW OF SYSTEMS:    Past Medical History:    Diagnosis Date    Family history of colon cancer requiring screening colonoscopy     HTN (hypertension)     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)     Kidney cancer, primary, with metastasis from kidney to other site, left (CMS [facility])            Past Surgical History:   Procedure Laterality Date    HX CESAREAN SECTION      HX CHOLECYSTECTOMY      HX CYST REMOVAL Right     HX HYSTERECTOMY      NEPHROURETERECTOMY Left     TOTAL THYROIDECTOMY             Social History     Socioeconomic History    Marital status: Married     Spouse name: Not on file    Number of children: Not on file    Years of education: Not on file    Highest education level: Not on file   Occupational History    Not on file   Tobacco Use    Smoking status: Never    Smokeless tobacco: Never   Vaping Use    Vaping status: Never Used   Substance and Sexual Activity  Alcohol  use: Never    Drug use: Never    Sexual activity: Yes     Partners: Male   Other Topics Concern    Not on file   Social History Narrative    Not on file     Social Determinants of Health     Financial Resource Strain: Not on file   Transportation Needs: No Transportation Needs (10/10/2022)    Received from [facility]    PRAPARE - Transportation     In the past 12 months, has lack of transportation kept you from medical appointments or from getting medications?: No     Lack of Transportation (Non-Medical): No   Social Connections: Not on file   Intimate Partner Violence: High Risk (01/13/2022)    Intimate Partner Violence     SDOH Domestic Violence: No   Housing Stability: Not on file       Social History     Social History Narrative    Not on file       Social History     Substance and Sexual Activity   Drug Use Never       Family Medical History:       Problem Relation (Age of Onset)    Cancer Maternal Aunt, Maternal Grandmother    Colon Cancer Maternal Uncle    Lung Cancer Mother    Melanoma Maternal Aunt              Current  Outpatient Medications   Medication Sig    azelastine  (ASTELIN ) 137 mcg (0.1 %) Nasal Spray, Non-Aerosol Administer 1 Spray into each nostril Twice daily Use in each nostril as directed Indications: non-seasonal allergic stuffy and runny nose    ergocalciferol , vitamin D2, (DRISDOL ) 1,250 mcg (50,000 unit) Oral Capsule TAKE 1 CAPSULE BY MOUTH ONE TIME PER WEEK    gabapentin  (NEURONTIN ) 300 mg Oral Capsule Take 1 Capsule (300 mg total) by mouth Once a day    levothyroxine  (SYNTHROID) 150 mcg Oral Tablet TAKE 1 TABLET BY MOUTH EVERY MORNING.    omeprazole  (PRILOSEC) 40 mg Oral Capsule, Delayed Release(E.C.) TAKE 1 CAPSULE (40 MG TOTAL) BY MOUTH DAILY.    predniSONE  (DELTASONE ) 20 mg Oral Tablet Take 3 Tablets (60 mg total) by mouth Daily for 7 days, THEN 2.5 Tablets (50 mg total) Daily for 7 days, THEN 2 Tablets (40 mg total) Daily for 7 days, THEN 1.5 Tablets (30 mg total) Daily for 7 days, THEN 1 Tablet (20 mg total) Daily for 7 days, THEN 0.5 Tablets (10 mg total) Daily for 7 days.    valsartan  (DIOVAN ) 40 mg Oral Tablet TAKE 2 TABLETS (80 MG TOTAL) BY MOUTH TWICE DAILY       Allergies[1]      PHYSICAL EXAM:  BP (!) 128/90 (Site: Left Arm, Patient Position: Sitting, Cuff Size: Adult Large)   Pulse 63   Temp 37.1 C (98.7 F) (Temporal)   Ht 1.753 m (5' 9)   Wt 103 kg (226 lb 9.6 oz)   SpO2 97%   BMI 33.46 kg/m        ECOG Status: (0) Fully active, able to carry on all predisease performance without restriction   Physical Exam  GENERAL: Alert, cooperative, well developed, no acute distress  HEENT: Normocephalic, normal oropharynx, moist mucous membranes  CHEST: Clear to auscultation bilaterally, No wheezes, rhonchi, or crackles  CARDIOVASCULAR: Normal heart rate and rhythm, S1 and S2 normal without murmurs  ABDOMEN: Soft, non-tender, non-distended, without  organomegaly, Normal bowel sounds  EXTREMITIES: No cyanosis or edema  NEUROLOGICAL: Cranial nerves grossly intact, Moves all extremities without gross  motor or sensory deficit        LABS:   Results  LABS  WBC: 5.2 (03/10/2024)  Hb: 14.9 (03/10/2024)  Hematocrit: 43.6 (03/10/2024)  PLT: 146,000 (03/10/2024)  GFR: 51 (03/10/2024)    RADIOLOGY  Left knee MRI: Complex tear involving the anterior horn of the medial meniscus; Baker's cyst (12/2023)       ASSESSMENT:    ICD-10-CM    1. Diarrhea due to drug  K52.1 predniSONE  (DELTASONE ) 20 mg Oral Tablet      2. Kidney cancer, primary, with metastasis from kidney to other site, left (CMS [facility])  C64.2 CT CHEST ABDOMEN PELVIS W IV CONTRAST      3. Abnormal urine odor  R82.90 URINALYSIS, MACROSCOPIC AND MICROSCOPIC W/CULTURE REFLEX           Assessment & Plan  Renal cell carcinoma, status post left nephrectomy  Completed Keytruda  treatment in June. Imaging last done in September. Next imaging due in December or January, considering insurance requirements.  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 Likely due to inflammation in the intestines, possibly colitis. Steroids considered to manage symptoms, with a lower starting dose to minimize side effects such as insomnia and hyperglycemia. Discussed potential need for colonoscopy if symptoms persist after steroid taper.  - Prescribed prednisone  starting at 60 mg daily, tapering by 10 mg weekly  - Advised to report if diarrhea recurs after stopping prednisone   - Will consider colonoscopy if diarrhea recurs    Abnormal urine odor, rule out urinary tract infection  Abnormal urine odor, possibly indicative of a urinary tract infection, especially given recent diarrhea. No other urinary symptoms reported.  - Ordered urinalysis to rule out urinary tract infection    Return in about 3 months (around 06/10/2024).    Taylor Smith was given the chance to ask questions, and these were answered to their satisfaction. The patient is  welcome to call with any questions or concerns in the meantime.     Randine Clara APRN, FNP-BC, AOCNP, 03/10/2024 , 09:50       This note was partially generated using MModal Fluency Direct system, and there may be some incorrect words, spellings, and punctuation that were not noted in checking the note before saving.   You can see your note(s) in [facility]. It is common for you to encounter certain medical terminology which may be unfamiliar to you. You might see results before your provider does so please give at least 2 business days for review. Please have this understanding, that NOT all abnormal results are significant. Our office will contact you for any urgent or emergent action if necessary. If you have any questions or concerns, feel free to send a MyChart message or call the office. Please call with any new or concerning symptoms.     CC:  Rosina Linsey, APRN, CNP  [address]  [place] [address]                           [1]   Allergies  Allergen Reactions    Aldactone [Spironolactone] Itching

## 2024-03-20 LAB — SIGNATERA ONLY
SIGNATERA MTM READOUT: 0 MTM/ml
SIGNATERA TEST RESULT: NEGATIVE

## 2024-04-02 ENCOUNTER — Other Ambulatory Visit (INDEPENDENT_AMBULATORY_CARE_PROVIDER_SITE_OTHER): Payer: Self-pay | Admitting: NURSE PRACTITIONER

## 2024-04-03 ENCOUNTER — Other Ambulatory Visit (INDEPENDENT_AMBULATORY_CARE_PROVIDER_SITE_OTHER): Payer: Self-pay | Admitting: NURSE PRACTITIONER

## 2024-04-03 DIAGNOSIS — K521 Toxic gastroenteritis and colitis: Secondary | ICD-10-CM

## 2024-04-04 ENCOUNTER — Encounter (INDEPENDENT_AMBULATORY_CARE_PROVIDER_SITE_OTHER): Payer: Self-pay | Admitting: NURSE PRACTITIONER

## 2024-04-04 ENCOUNTER — Other Ambulatory Visit (INDEPENDENT_AMBULATORY_CARE_PROVIDER_SITE_OTHER): Payer: Self-pay | Admitting: NURSE PRACTITIONER

## 2024-04-04 DIAGNOSIS — E039 Hypothyroidism, unspecified: Secondary | ICD-10-CM

## 2024-04-05 ENCOUNTER — Encounter (INDEPENDENT_AMBULATORY_CARE_PROVIDER_SITE_OTHER): Payer: Self-pay | Admitting: NURSE PRACTITIONER

## 2024-04-05 ENCOUNTER — Other Ambulatory Visit (INDEPENDENT_AMBULATORY_CARE_PROVIDER_SITE_OTHER): Payer: Self-pay | Admitting: NURSE PRACTITIONER

## 2024-04-05 DIAGNOSIS — R197 Diarrhea, unspecified: Secondary | ICD-10-CM

## 2024-04-11 ENCOUNTER — Other Ambulatory Visit: Payer: Self-pay

## 2024-04-12 ENCOUNTER — Ambulatory Visit (INDEPENDENT_AMBULATORY_CARE_PROVIDER_SITE_OTHER): Payer: Self-pay | Admitting: NURSE PRACTITIONER

## 2024-04-12 ENCOUNTER — Ambulatory Visit
Admission: RE | Admit: 2024-04-12 | Discharge: 2024-04-12 | Disposition: A | Discharge status: Home / Self Care | Source: Ambulatory Visit | Attending: NURSE PRACTITIONER | Admitting: NURSE PRACTITIONER

## 2024-04-12 DIAGNOSIS — C642 Malignant neoplasm of left kidney, except renal pelvis: Secondary | ICD-10-CM | POA: Insufficient documentation

## 2024-04-12 DIAGNOSIS — R918 Other nonspecific abnormal finding of lung field: Secondary | ICD-10-CM

## 2024-04-12 MED ORDER — IOHEXOL 350 MG IODINE/ML INTRAVENOUS SOLUTION
75.0000 mL | INTRAVENOUS | Status: AC
  Administered 2024-04-12: 75 mL via INTRAVENOUS

## 2024-04-13 ENCOUNTER — Other Ambulatory Visit: Payer: Self-pay

## 2024-04-13 ENCOUNTER — Encounter (HOSPITAL_BASED_OUTPATIENT_CLINIC_OR_DEPARTMENT_OTHER): Payer: Self-pay

## 2024-04-13 ENCOUNTER — Emergency Department (HOSPITAL_BASED_OUTPATIENT_CLINIC_OR_DEPARTMENT_OTHER)

## 2024-04-13 ENCOUNTER — Emergency Department
Admission: EM | Admit: 2024-04-13 | Discharge: 2024-04-13 | Disposition: A | Discharge status: Home / Self Care | Attending: FAMILY PRACTICE | Admitting: FAMILY PRACTICE

## 2024-04-13 DIAGNOSIS — R0989 Other specified symptoms and signs involving the circulatory and respiratory systems: Secondary | ICD-10-CM

## 2024-04-13 DIAGNOSIS — I4510 Unspecified right bundle-branch block: Secondary | ICD-10-CM | POA: Insufficient documentation

## 2024-04-13 DIAGNOSIS — K219 Gastro-esophageal reflux disease without esophagitis: Secondary | ICD-10-CM

## 2024-04-13 DIAGNOSIS — B9789 Other viral agents as the cause of diseases classified elsewhere: Secondary | ICD-10-CM | POA: Insufficient documentation

## 2024-04-13 DIAGNOSIS — Z85528 Personal history of other malignant neoplasm of kidney: Secondary | ICD-10-CM | POA: Insufficient documentation

## 2024-04-13 DIAGNOSIS — U071 COVID-19: Secondary | ICD-10-CM | POA: Insufficient documentation

## 2024-04-13 DIAGNOSIS — D84821 Immunodeficiency due to drugs: Secondary | ICD-10-CM | POA: Insufficient documentation

## 2024-04-13 DIAGNOSIS — I445 Left posterior fascicular block: Secondary | ICD-10-CM | POA: Insufficient documentation

## 2024-04-13 DIAGNOSIS — Z79899 Other long term (current) drug therapy: Secondary | ICD-10-CM | POA: Insufficient documentation

## 2024-04-13 DIAGNOSIS — R079 Chest pain, unspecified: Secondary | ICD-10-CM

## 2024-04-13 DIAGNOSIS — J069 Acute upper respiratory infection, unspecified: Secondary | ICD-10-CM | POA: Insufficient documentation

## 2024-04-13 DIAGNOSIS — R509 Fever, unspecified: Secondary | ICD-10-CM

## 2024-04-13 DIAGNOSIS — R12 Heartburn: Secondary | ICD-10-CM

## 2024-04-13 LAB — COVID-19, FLU A/B, RSV RAPID BY PCR
INFLUENZA VIRUS TYPE A: NOT DETECTED
INFLUENZA VIRUS TYPE B: NOT DETECTED
RESPIRATORY SYNCTIAL VIRUS (RSV): NOT DETECTED
SARS-CoV-2: DETECTED — AB

## 2024-04-13 LAB — URINALYSIS, MACRO/MICRO
BILIRUBIN: NEGATIVE mg/dL
BLOOD: NEGATIVE mg/dL
GLUCOSE: NEGATIVE mg/dL
KETONES: NEGATIVE mg/dL
LEUKOCYTES: NEGATIVE WBCs/uL
NITRITE: NEGATIVE
PH: 6 (ref 4.6–8.0)
SPECIFIC GRAVITY: 1.015 (ref 1.003–1.035)
UROBILINOGEN: 0.2 mg/dL (ref 0.2–1.0)

## 2024-04-13 LAB — CBC WITH DIFF
BASOPHIL #: 0.01 x10ˆ3/uL (ref 0.00–0.10)
BASOPHIL %: 0 % (ref 0–1)
EOSINOPHIL #: 0.11 x10ˆ3/uL (ref 0.00–0.50)
EOSINOPHIL %: 1 % (ref 1–7)
HCT: 48.5 % — ABNORMAL HIGH (ref 31.2–41.9)
HGB: 15.4 g/dL — ABNORMAL HIGH (ref 10.9–14.3)
LYMPHOCYTE #: 0.35 x10ˆ3/uL — ABNORMAL LOW (ref 1.10–3.10)
LYMPHOCYTE %: 4 % — ABNORMAL LOW (ref 16–46)
MCH: 29.5 pg (ref 24.7–32.8)
MCHC: 31.6 g/dL — ABNORMAL LOW (ref 32.3–35.6)
MCV: 93.2 fL (ref 75.5–95.3)
MONOCYTE #: 0.65 x10ˆ3/uL (ref 0.20–0.90)
MONOCYTE %: 8 % (ref 4–11)
MPV: 8.7 fL (ref 7.9–10.8)
NEUTROPHIL #: 6.96 x10ˆ3/uL (ref 1.90–8.20)
NEUTROPHIL %: 86 % — ABNORMAL HIGH (ref 43–77)
PLATELETS: 149 x10ˆ3/uL (ref 140–440)
RBC: 5.2 x10ˆ6/uL — ABNORMAL HIGH (ref 3.63–4.92)
RDW: 15.7 % (ref 12.3–17.7)
WBC: 8.1 x10ˆ3/uL (ref 3.8–11.8)

## 2024-04-13 LAB — HEPATIC FUNCTION PANEL
ALBUMIN/GLOBULIN RATIO: 1 (ref 0.8–1.4)
ALBUMIN: 3.5 g/dL (ref 3.4–5.0)
ALKALINE PHOSPHATASE: 85 U/L (ref 46–116)
ALT (SGPT): 86 U/L — ABNORMAL HIGH (ref <=78)
AST (SGOT): 37 U/L (ref 15–37)
BILIRUBIN DIRECT: 0.3 mg/dL — ABNORMAL HIGH (ref 0.0–0.2)
BILIRUBIN TOTAL: 1.4 mg/dL — ABNORMAL HIGH (ref 0.2–1.0)
BILIRUBIN, INDIRECT: 1.1 mg/dL
GLOBULIN: 3.5
PROTEIN TOTAL: 7 g/dL (ref 6.4–8.2)

## 2024-04-13 LAB — BASIC METABOLIC PANEL
ANION GAP: 11 mmol/L (ref 4–13)
BUN/CREA RATIO: 12
BUN: 15 mg/dL (ref 7–18)
CALCIUM: 8.9 mg/dL (ref 8.5–10.1)
CHLORIDE: 99 mmol/L (ref 98–107)
CO2 TOTAL: 23 mmol/L (ref 21–32)
CREATININE: 1.26 mg/dL — ABNORMAL HIGH (ref 0.55–1.02)
ESTIMATED GFR: 51 mL/min/1.73mˆ2 — ABNORMAL LOW (ref >59)
GLUCOSE: 137 mg/dL — ABNORMAL HIGH (ref 74–106)
OSMOLALITY, CALCULATED: 269 mosm/kg — ABNORMAL LOW (ref 270–290)
POTASSIUM: 4.1 mmol/L (ref 3.5–5.1)
SODIUM: 133 mmol/L — ABNORMAL LOW (ref 136–145)

## 2024-04-13 LAB — TROPONIN-I: TROPONIN I: 4 ng/L (ref <15)

## 2024-04-13 LAB — URINALYSIS, MICROSCOPIC

## 2024-04-13 LAB — PTT (PARTIAL THROMBOPLASTIN TIME): APTT: 28.6 s (ref 25.0–38.0)

## 2024-04-13 LAB — PT/INR
INR: 1.07 (ref 0.84–1.10)
PROTHROMBIN TIME: 12.1 s (ref 9.8–12.7)

## 2024-04-13 LAB — LIPASE: LIPASE: 48 U/L (ref 15–77)

## 2024-04-13 LAB — LACTIC ACID LEVEL W/ REFLEX FOR LEVEL >2.0: LACTIC ACID: 1.3 mmol/L (ref 0.4–2.0)

## 2024-04-13 MED ORDER — DEXAMETHASONE 6 MG TABLET: 6.0000 mg | ORAL_TABLET | Freq: Every day | ORAL | 0 refills | Status: AC

## 2024-04-13 MED ORDER — HYOSCYAMINE 0.125 MG/5 ML ORAL ELIXIR
10.0000 mL | ORAL_SOLUTION | Freq: Once | ORAL | Status: AC
  Administered 2024-04-13: 10 mL via ORAL

## 2024-04-13 MED ORDER — SUCRALFATE 100 MG/ML ORAL SUSPENSION
ORAL | Status: AC
  Filled 2024-04-13: qty 10

## 2024-04-13 MED ORDER — LACTATED RINGERS IV BOLUS
1500.0000 mL | INJECTION | Status: AC
  Administered 2024-04-13: 1500 mL via INTRAVENOUS
  Administered 2024-04-13: 0 mL via INTRAVENOUS

## 2024-04-13 MED ORDER — LIDOCAINE HCL 2 % MUCOSAL SOLUTION
Status: AC
  Filled 2024-04-13: qty 15

## 2024-04-13 MED ORDER — ALUMINUM-MAG HYDROXIDE-SIMETHICONE 200 MG-200 MG-20 MG/5 ML ORAL SUSP
30.0000 mL | Freq: Once | ORAL | Status: AC
  Administered 2024-04-13: 30 mL via ORAL

## 2024-04-13 MED ORDER — SODIUM CHLORIDE 0.9 % (FLUSH) INJECTION SYRINGE: 3.0000 mL | INJECTION | Freq: Three times a day (TID) | INTRAMUSCULAR | Status: DC

## 2024-04-13 MED ORDER — SODIUM CHLORIDE 0.9 % (FLUSH) INJECTION SYRINGE: 3.0000 mL | INJECTION | INTRAMUSCULAR | Status: DC | PRN

## 2024-04-13 MED ORDER — SUCRALFATE 1 GRAM TABLET
1.0000 g | ORAL_TABLET | ORAL | Status: AC
  Administered 2024-04-13: 1 g via ORAL

## 2024-04-13 MED ORDER — HYOSCYAMINE 0.125 MG/5 ML ORAL ELIXIR
ORAL_SOLUTION | ORAL | Status: AC
  Filled 2024-04-13: qty 10

## 2024-04-13 MED ORDER — SUCRALFATE 1 GRAM TABLET: 1.0000 g | ORAL_TABLET | Freq: Four times a day (QID) | ORAL | 0 refills | Status: AC

## 2024-04-13 MED ORDER — ALUMINUM-MAG HYDROXIDE-SIMETHICONE 200 MG-200 MG-20 MG/5 ML ORAL SUSP
ORAL | Status: AC
  Filled 2024-04-13: qty 30

## 2024-04-13 MED ORDER — LIDOCAINE HCL 2 % MUCOSAL SOLUTION
15.0000 mL | Freq: Once | Status: AC
  Administered 2024-04-13: 15 mL via ORAL

## 2024-04-13 MED ORDER — SUCRALFATE 1 GRAM TABLET
ORAL_TABLET | ORAL | Status: AC
  Filled 2024-04-13: qty 1

## 2024-04-13 MED ORDER — PAXLOVID 300 MG (150 MG X 2)-100 MG TABLETS IN A DOSE PACK: 3.0000 | ORAL_TABLET | Freq: Two times a day (BID) | ORAL | 0 refills | Status: AC

## 2024-04-13 NOTE — ED Nurses Note (Signed)
 Patient and spouse updated that she has Covid. Discussed quarantine and suggested care at home.

## 2024-04-13 NOTE — Discharge Instructions (Addendum)
 Your respiratory infection in his COVID, with your recent steroid use, Keytruda  and cancer I am going to treat you with Paxlovid , and Decadron .  You have normal renal function so you will be on the full dose Paxlovid .  Take as directed in the pill pack.  Your also be given 6 mg of Decadron  to take for the next 5 days.    For cold symptom control improvement you can use the DayQuil/NyQuil pill packs for symptom control and improvement, cool vapor humidifier in the room, and you can use nasal saline spray in each nostril as often as necessary to moisturize and sooth the nasal passages, and thin in the thick secretions that you may have.  For nasal congestion you can use Afrin nasal decongestant spray, 2 sprays in each nostril twice daily for 3 days and stop.  You have you use Afrin for more than 3 days without stopping for a week at minimum, you can get rebound nasal passage swelling due to the way this medication works.  Your medication in his safe if stopped after a 3 day period of time.  Push the fluids, healthy diet and rest    Continue any other home medications that you may have, call and follow up with Oncology as well as primary care.    Should you have any concerning findings, increased fevers, body aches, joint aches, wheeze or difficulty breathing you need to be re-evaluated immediately for worsening of the your COVID and related symptoms.    For your gastroesophageal reflux disease and heartburn in his a prescription for Carafate  at the pharmacy as well, no eating or for 5 hours before bed and no drinking 3 hours before bed, you can also placed 1 brick under the feet of your bed at the headboard for minor elevation to help reduce reflux.  No spicy or greasy foods, they can worsen reflux.

## 2024-04-13 NOTE — ED Provider Notes (Signed)
 [facility], [place] - Emergency Department  ED Primary Note  History of Present Illness   Taylor Smith is a 53 y.o. female who had concerns including Indigestion.  This 53 year old female patient presents emergency department with complaints of indigestion with a very sharp pain radiating into the back and acid coming up into the back of the throat intermittently for the last 2 days.  She states she is on steroids to help out with side effects of Keytruda .  Patient states that she has has a nasal congestion with the last couple of days, then yesterday left nasal passage seemed blocked and she had some left facial pressure.  She has had some postnasal drainage without pharyngitis.  She further states she has had no body aches, but did has a facial pain no true headaches and her husband states that she had a fever of almost 102 yesterday.  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    Past medical history:  Left renal cell carcinoma, hypoparathyroidism, essential hypertension, family history colon cancer.    Past surgical history:  Cesarean section, cholecystectomy, total thyroidectomy, hysterectomy, left nephro ureterectomy.    Social history:  She has never smoked, vape, use alcohol , marijuana or illicit drugs.    Vaccination status:  She is COVID vaccinated without boosters, flu shot every year.  She has a never had COVID her knowledge, did not have influenza last season.    Physical Exam   ED Triage Vitals [04/13/24 0924]   BP (Non-Invasive) 133/89   Heart Rate 98   Respiratory Rate 18   Temperature 36.1 C (96.9 F)   SpO2 96 %   Weight 99.8 kg (220 lb)   Height 1.753 m (5' 9)     Physical Exam  General:  Patient is sitting up, tearful in pain.  She does look anxious.  Face:  No facial swelling or  asymmetries  Ears:  TMs clear without fluid or retraction   Nasal: Injected mucosa with turbinate swelling, left nasal passage nearly occluded.  Does not have a gray green mucus in both passages.    Oral:  Pink moist oropharynx:  Injected, no petechiae PND or exudate noted currently   Neck:  Supple without adenopathy   Lungs: Clear symmetrical good aeration   Heart: Regular rate and rhythm without murmur   Abdomen:  Soft normal bowel sounds nontender   Extremities:  Moving symmetrically.    Patient Data   Labs Ordered/Reviewed   BASIC METABOLIC PANEL - Abnormal; Notable for the following components:       Result Value    SODIUM 133 (*)     GLUCOSE 137 (*)     CREATININE 1.26 (*)     ESTIMATED GFR 51 (*)     OSMOLALITY, CALCULATED 269 (*)     All other components within normal limits    Narrative:     Estimated Glomerular Filtration Rate (eGFR) is calculated using the CKD-EPI (2021) equation, intended for patients 35 years of age and older. If gender is not documented or unknown, there will be no eGFR calculation.   HEPATIC FUNCTION PANEL - Abnormal; Notable for the following components:    ALT (SGPT) 86 (*)     BILIRUBIN TOTAL 1.4 (*)     BILIRUBIN DIRECT 0.3 (*)  All other components within normal limits   COVID-19, FLU A/B, RSV RAPID BY PCR - Abnormal; Notable for the following components:    SARS-CoV-2 Detected (*)     All other components within normal limits    Narrative:     Results are for the simultaneous qualitative identification of SARS-CoV-2 (formerly 2019-nCoV), Influenza A, Influenza B, and RSV RNA. These etiologic agents are generally detectable in nasopharyngeal and nasal swabs during the ACUTE PHASE of infection. Hence, this test is intended to be performed on respiratory specimens collected from individuals with signs and symptoms of upper respiratory tract infection who meet Centers for Disease Control and Prevention (CDC) clinical and/or epidemiological criteria for Coronavirus Disease 2019  (COVID-19) testing. CDC COVID-19 criteria for testing on human specimens is available at [facility] webpage information for Healthcare Professionals: Coronavirus Disease 2019 (COVID-19) ([email or web address]com.au).     False-negative results may occur if the virus has genomic mutations, insertions, deletions, or rearrangements or if performed very early in the course of illness. Otherwise, negative results indicate virus specific RNA targets are not detected, however negative results do not preclude SARS-CoV-2 infection/COVID-19, Influenza, or Respiratory syncytial virus infection. Results should not be used as the sole basis for patient management decisions. Negative results must be combined with clinical observations, patient history, and epidemiological information. If upper respiratory tract infection is still suspected based on exposure history together with other clinical findings, re-testing should be considered.    Test methodology:   Cepheid Xpert Xpress SARS-CoV-2/Flu/RSV Assay real-time polymerase chain reaction (RT-PCR) test on the GeneXpert Dx and Xpert Xpress systems.   CBC WITH DIFF - Abnormal; Notable for the following components:    RBC 5.20 (*)     HGB 15.4 (*)     HCT 48.5 (*)     MCHC 31.6 (*)     NEUTROPHIL % 86 (*)     LYMPHOCYTE % 4 (*)     LYMPHOCYTE # 0.35 (*)     All other components within normal limits   URINALYSIS, MACRO/MICRO - Abnormal; Notable for the following components:    APPEARANCE Slightly Hazy (*)     PROTEIN Trace (*)     All other components within normal limits   URINALYSIS, MICROSCOPIC - Abnormal; Notable for the following components:    BACTERIA Few (*)     MUCOUS Moderate (*)     WBCS 6-10 (*)     All other components within normal limits   LACTIC ACID LEVEL W/ REFLEX FOR LEVEL >2.0 - Normal   PT/INR - Normal    Narrative:     In the setting of warfarin therapy, a moderate-intensity INR goal range is 2.0 to 3.0 and a high-intensity INR goal range  is 2.5 to 3.5.    INR is ONLY validated to determine the level of anticoagulation with vitamin K antagonists (warfarin). Other factors may elevate the INR including but not limited to direct oral anticoagulants (DOACs), liver dysfunction, vitamin K deficiency, DIC, factor deficiencies, and factor inhibitors.   PTT (PARTIAL THROMBOPLASTIN TIME) - Normal   TROPONIN-I - Normal    Narrative:     Values received on females ranging between 12-15 ng/L MUST include the next serial troponin to review changes in the delta differences as the reference range for the Access II chemistry analyzer is lower than the established reference range.     LIPASE - Normal   ADULT ROUTINE BLOOD CULTURE, SET OF 2 BOTTLES (BACTERIA AND YEAST)   ADULT ROUTINE BLOOD  CULTURE, SET OF 2 BOTTLES (BACTERIA AND YEAST)   CBC/DIFF    Narrative:     The following orders were created for panel order CBC/DIFF.  Procedure                               Abnormality         Status                     ---------                               -----------         ------                     CBC WITH IPQQ[[id number]]                Abnormal            Final result                 Please view results for these tests on the individual orders.   URINALYSIS WITH REFLEX MICROSCOPIC AND CULTURE IF POSITIVE    Narrative:     The following orders were created for panel order URINALYSIS WITH REFLEX MICROSCOPIC AND CULTURE IF POSITIVE.  Procedure                               Abnormality         Status                     ---------                               -----------         ------                     URINALYSIS, MACRO/MICRO[[id number]]      Abnormal            Final result               URINALYSIS, MICROSCOPIC[[id number]]      Abnormal            Final result                 Please view results for these tests on the individual orders.   EXTRA TUBES    Narrative:     The following orders were created for panel order EXTRA TUBES.  Procedure                                Abnormality         Status                     ---------                               -----------         ------                     GOLD TOP ULAZ[[id number]]  In process                   Please view results for these tests on the individual orders.   GOLD TOP TUBE     XR AP MOBILE CHEST   Final Result by Edi, Radresults In (12/24 1016)   NEGATIVE CHEST            Radiologist location ID: [id number]           Medical Decision Making        Medical Decision Making  This 53 year old female patient presents emergency department with a 1 day history of nasal congestion and rapidly progress to sinus pressure, drainage, cough and postnasal drip without pharyngitis.  She denies any dyspnea, did have a fever of over 101 this morning.  She furthermore has just completed a course of prednisone  at 60 mg a day starting in November down 2.5 mg a day stopping yesterday, and has had severe pyrosis, heartburn with the upper epigastric pain.  Patient has a immunocompromise with a history of renal cell carcinoma, use of Keytruda , questionably some colitis secondary to Keytruda  which was stopped and then 60 mg of prednisone  daily tapered off over the last month.  Laboratory data shows chest x-ray with no acute process, EKG shows sinus rhythm, no acute changes from previous EKGs, 4 Plex showed COVID, lactic acid was 1.3 and troponin was 5.  CMP had a 133 sodium, BUN was normal and creatinine was 1.26 with a 51 GFR, ALT was 86 with a high normal being 78 and her CBC showed normal white count and an H&H of 15 and 45.  Patient was discharged to home to use Afrin nasal decongestant spray for 3 days and stop, humidify the room, nasal saline spray as needed for nasal sounding and thinning of thick secretions.  She is also advised to get the DayQuil/NyQuil cold pack, and there is a prescription for Paxlovid  and Decadron  at her pharmacy.  She is to continue other home medications as prescribed, call follow  up primary care and oncology.  Turned to the ER symptoms worsen change or become concerning.      Amount and/or Complexity of Data Reviewed  Labs: ordered.     Details: Labs reviewed and noted, COVID  Radiology: ordered.     Details: Chest x-ray reviewed and noted  ECG/medicine tests: ordered.     Details: EKG reviewed and noted    Risk  OTC drugs.  Prescription drug management.                Medications Ordered/Administered in the ED   NS flush syringe (has no administration in time range)   NS flush syringe (has no administration in time range)   aluminum -magnesium hydroxide-simethicone  (MAG-AL PLUS) 200-200-20 mg per 5 mL oral liquid (30 mL Oral Given 04/13/24 0950)     And   hyoscyamine  (HYOSYNE ) 0.125 mg per 5 mL oral liquid (10 mL Oral Given 04/13/24 0950)     And   lidocaine  (XYLOCAINE ) 2% oral topical viscous solution (15 mL Oral Given 04/13/24 0950)   sucralfate  (CARAFATE ) tablet (1 g Oral Given 04/13/24 0953)   LR bolus infusion 1,500 mL (0 mL Intravenous Stopped 04/13/24 1057)     Clinical Impression   COVID-19   Viral URI with cough (Primary)   Personal history of renal cell carcinoma   Immunocompromised state due to drug therapy       Disposition: Discharged

## 2024-04-13 NOTE — ED Nurses Note (Signed)
 Patient discharged home all instructions gone over with patient including medications with opportunity to ask questions. Patient verbalized understanding and ambulated off unit independently.

## 2024-04-13 NOTE — ED Nurses Note (Signed)
 Dr Altha in room going over all results with patient.

## 2024-04-14 DIAGNOSIS — I452 Bifascicular block: Secondary | ICD-10-CM

## 2024-04-14 DIAGNOSIS — R9431 Abnormal electrocardiogram [ECG] [EKG]: Secondary | ICD-10-CM

## 2024-04-14 LAB — ECG 12 LEAD
Atrial Rate: 90 {beats}/min
Calculated P Axis: 60 degrees
Calculated R Axis: 115 degrees
Calculated T Axis: -1 degrees
PR Interval: 152 ms
QRS Duration: 100 ms
QT Interval: 350 ms
QTC Calculation: 428 ms
Ventricular rate: 90 {beats}/min

## 2024-04-17 ENCOUNTER — Encounter (INDEPENDENT_AMBULATORY_CARE_PROVIDER_SITE_OTHER): Payer: Self-pay | Admitting: NURSE PRACTITIONER

## 2024-04-18 ENCOUNTER — Other Ambulatory Visit (INDEPENDENT_AMBULATORY_CARE_PROVIDER_SITE_OTHER): Payer: Self-pay | Admitting: NURSE PRACTITIONER

## 2024-04-18 DIAGNOSIS — R9431 Abnormal electrocardiogram [ECG] [EKG]: Secondary | ICD-10-CM

## 2024-04-18 LAB — ADULT ROUTINE BLOOD CULTURE, SET OF 2 BOTTLES (BACTERIA AND YEAST)
BLOOD CULTURE, ROUTINE: NO GROWTH
BLOOD CULTURE, ROUTINE: NO GROWTH

## 2024-04-18 NOTE — Telephone Encounter (Signed)
 I agree that the EKG has changed and would recommend cardiology of choice. Locally Dr. Real or in [place], Dr. Ilda

## 2024-04-27 ENCOUNTER — Other Ambulatory Visit (INDEPENDENT_AMBULATORY_CARE_PROVIDER_SITE_OTHER): Payer: Self-pay | Admitting: NURSE PRACTITIONER

## 2024-06-16 ENCOUNTER — Ambulatory Visit (INDEPENDENT_AMBULATORY_CARE_PROVIDER_SITE_OTHER): Payer: Self-pay | Admitting: NURSE PRACTITIONER

## 2024-08-17 ENCOUNTER — Ambulatory Visit (INDEPENDENT_AMBULATORY_CARE_PROVIDER_SITE_OTHER): Payer: Self-pay | Admitting: NURSE PRACTITIONER

## 2024-12-27 ENCOUNTER — Ambulatory Visit (INDEPENDENT_AMBULATORY_CARE_PROVIDER_SITE_OTHER): Payer: Self-pay | Admitting: NURSE PRACTITIONER
